# Patient Record
Sex: Male | Born: 1985 | Race: White | Hispanic: No | Marital: Single | Smoking: Current every day smoker
Health system: Southern US, Community
[De-identification: ages and names within clinical notes are randomized; demographics above are authoritative.]

## PROBLEM LIST (undated history)

## (undated) DIAGNOSIS — I509 Heart failure, unspecified: Secondary | ICD-10-CM

## (undated) DIAGNOSIS — I10 Essential (primary) hypertension: Secondary | ICD-10-CM

## (undated) DIAGNOSIS — R0683 Snoring: Secondary | ICD-10-CM

## (undated) DIAGNOSIS — K469 Unspecified abdominal hernia without obstruction or gangrene: Secondary | ICD-10-CM

## (undated) DIAGNOSIS — E669 Obesity, unspecified: Secondary | ICD-10-CM

## (undated) HISTORY — PX: UMBILICAL HERNIA REPAIR: SHX196

---

## 2018-08-16 ENCOUNTER — Emergency Department (HOSPITAL_COMMUNITY): Payer: Self-pay

## 2018-08-16 ENCOUNTER — Other Ambulatory Visit: Payer: Self-pay

## 2018-08-16 ENCOUNTER — Emergency Department (HOSPITAL_COMMUNITY)
Admission: EM | Admit: 2018-08-16 | Discharge: 2018-08-16 | Disposition: A | Discharge status: Home / Self Care | Payer: Self-pay | Attending: Emergency Medicine | Admitting: Emergency Medicine

## 2018-08-16 ENCOUNTER — Encounter (HOSPITAL_COMMUNITY): Payer: Self-pay | Admitting: *Deleted

## 2018-08-16 DIAGNOSIS — I10 Essential (primary) hypertension: Secondary | ICD-10-CM | POA: Insufficient documentation

## 2018-08-16 DIAGNOSIS — J069 Acute upper respiratory infection, unspecified: Secondary | ICD-10-CM | POA: Insufficient documentation

## 2018-08-16 DIAGNOSIS — H9221 Otorrhagia, right ear: Secondary | ICD-10-CM

## 2018-08-16 DIAGNOSIS — H9191 Unspecified hearing loss, right ear: Secondary | ICD-10-CM | POA: Insufficient documentation

## 2018-08-16 DIAGNOSIS — B9789 Other viral agents as the cause of diseases classified elsewhere: Secondary | ICD-10-CM

## 2018-08-16 HISTORY — DX: Essential (primary) hypertension: I10

## 2018-08-16 NOTE — ED Notes (Signed)
Pt is alert and oriented x 4 and is verbally responsive.  

## 2018-08-16 NOTE — ED Triage Notes (Signed)
Pt presents with active bleeding from right ear.  Pt c/o cough x 2 days with green/yellowish sputum.

## 2018-08-16 NOTE — Discharge Instructions (Signed)
You have cough and ear bleeding here. Stop using the cue tips at home for cleaning the ears. Call the ENT doctor listed and schedule the next available appointment. Stay home for the next week until your symptoms resolve. I am writing you a work note to that effect. Return to the ED with any chest pain, trouble breathing, or other new or worsening symptoms.

## 2018-08-16 NOTE — ED Provider Notes (Signed)
Emergency Department Provider Note   I have reviewed the triage vital signs and the nursing notes.   HISTORY  Chief Complaint ear bleeding (right) and Cough   HPI Donald Murray is a 33 y.o. male with PMH of HTN presents to the ED with right ear bleeding, fullness, cough.  Patient has had 2 days of cough and right ear fullness.  He notes sputum with green/yellow color.  No hemoptysis.  Patient denies any head injury.  He states he was playing at home when he felt some trickling out of his right ear which she then saw was blood.  Patient does use Q-tips frequently but not at the time of bleeding onset.  He has decreased hearing in the right ear.  No ringing in the ear.  No recent travel or sick contact.  No body aches. No fever.   Past Medical History:  Diagnosis Date  . Hypertension     There are no active problems to display for this patient.   Past Surgical History:  Procedure Laterality Date  . UMBILICAL HERNIA REPAIR     Allergies Patient has no allergy information on record.  No family history on file.  Social History Social History   Tobacco Use  . Smoking status: Not on file  Substance Use Topics  . 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 Drug use: Not Currently    Comment: Pt stated "It was opiates"    Review of Systems  Constitutional: No fever/chills Eyes: No visual changes. ENT: No sore throat. Positive right ear bleeding and fullness.  Cardiovascular: Denies chest pain. Respiratory: Denies shortness of breath. Positive cough.  Gastrointestinal: No abdominal pain. No nausea, no vomiting. No diarrhea. No constipation. Genitourinary: Negative for dysuria. Musculoskeletal: Negative for back pain. Skin: Negative for rash. Neurological: Negative for headaches, focal weakness or numbness.  10-point ROS otherwise negative.  ____________________________________________   PHYSICAL EXAM:  VITAL SIGNS: ED Triage Vitals   Enc Vitals Group     BP 08/16/18 2016 (!) 166/125     Pulse Rate 08/16/18 2016 (!) 128     Resp 08/16/18 2016 20     Temp 08/16/18 2016 98.8 F (37.1 C)     Temp Source 08/16/18 2016 Oral     SpO2 08/16/18 2016 98 %     Weight 08/16/18 2017 270 lb (122.5 kg)     Height 08/16/18 2017 5\' 11"  (1.803 m)     Pain Score 08/16/18 2017 6   Constitutional: Alert and oriented. Well appearing and in no acute distress. Eyes: Conjunctivae are normal. Head: Atraumatic. Ears:  Healthy appearing ear canals. BRB coming from the right ear. TM visualized without perforation. Question abrasion of the canal. No discharge.  Nose: No congestion/rhinnorhea. Mouth/Throat: Mucous membranes are moist.  Oropharynx with mild erythema. No exudate.  Neck: No stridor.   Cardiovascular: Normal rate, regular rhythm. Good peripheral circulation. Grossly normal heart sounds.   Respiratory: Normal respiratory effort.  No retractions. Lungs CTAB. Gastrointestinal: No distention.  Musculoskeletal: No lower extremity tenderness nor edema. No gross deformities of extremities. Neurologic:  Normal speech and language. No gross focal neurologic deficits are appreciated.  Skin:  Skin is warm, dry and intact. No rash noted.  ____________________________________________  RADIOLOGY  Dg Chest Portable 1 View  Result Date: 08/16/2018 CLINICAL DATA:  Productive cough. EXAM: PORTABLE CHEST 1 VIEW COMPARISON:  None. FINDINGS: The heart size and mediastinal contours are within normal limits. Both  lungs are clear. The visualized skeletal structures are unremarkable. IMPRESSION: No active disease. Electronically Signed   By: Lupita Raider, M.D.   On: 08/16/2018 20:55    ____________________________________________   PROCEDURES  Procedure(s) performed:   Procedures  None  ____________________________________________   INITIAL IMPRESSION / ASSESSMENT AND PLAN / ED COURSE  Pertinent labs & imaging results that were  available during my care of the patient were reviewed by me and considered in my medical decision making (see chart for details).  Patient with active bleeding from the right ear which began spontaneously.  He has had cough for the past 2 days without fever.  No travel or known COVID exposure. Plan for CXR.  The right ear exam is somewhat limited due to oozing blood but bleeding not appear active at this time.  I am able to visualize the TM and do not appreciate a perforation.  Patient will require ENT follow-up as an outpatient.  Decreased hearing on the right. Advised that the patient discontinue cue-tip usage at home.   CXR without infiltrate. Plan for ENT follow up and supportive care at home. Advised self quarantine for 1 week with URI symptoms. Patient verbalizes understanding of this recommendation. Discussed ED return precautions.   ____________________________________________  FINAL CLINICAL IMPRESSION(S) / ED DIAGNOSES  Final diagnoses:  Viral URI with cough  Bleeding from right ear  Decreased hearing of right ear    Note:  This document was prepared using Dragon voice recognition software and may include unintentional dictation errors.  Alona Bene, MD Emergency Medicine    Long, Arlyss Repress, MD 08/16/18 2258

## 2019-03-03 ENCOUNTER — Emergency Department (HOSPITAL_COMMUNITY): Payer: Self-pay

## 2019-03-03 ENCOUNTER — Encounter (HOSPITAL_COMMUNITY): Payer: Self-pay | Admitting: Emergency Medicine

## 2019-03-03 ENCOUNTER — Other Ambulatory Visit: Payer: Self-pay

## 2019-03-03 ENCOUNTER — Inpatient Hospital Stay (HOSPITAL_COMMUNITY)
Admission: EM | Admit: 2019-03-03 | Discharge: 2019-03-06 | DRG: 501 | Discharge status: Against Medical Advice | Payer: Self-pay | Attending: Internal Medicine | Admitting: Internal Medicine

## 2019-03-03 DIAGNOSIS — L03116 Cellulitis of left lower limb: Secondary | ICD-10-CM | POA: Diagnosis present

## 2019-03-03 DIAGNOSIS — M65142 Other infective (teno)synovitis, left hand: Principal | ICD-10-CM | POA: Diagnosis present

## 2019-03-03 DIAGNOSIS — I10 Essential (primary) hypertension: Secondary | ICD-10-CM | POA: Diagnosis present

## 2019-03-03 DIAGNOSIS — L03113 Cellulitis of right upper limb: Secondary | ICD-10-CM

## 2019-03-03 DIAGNOSIS — Z590 Homelessness: Secondary | ICD-10-CM

## 2019-03-03 DIAGNOSIS — Z5329 Procedure and treatment not carried out because of patient's decision for other reasons: Secondary | ICD-10-CM | POA: Diagnosis present

## 2019-03-03 DIAGNOSIS — Z560 Unemployment, unspecified: Secondary | ICD-10-CM

## 2019-03-03 DIAGNOSIS — L02413 Cutaneous abscess of right upper limb: Secondary | ICD-10-CM | POA: Diagnosis present

## 2019-03-03 DIAGNOSIS — L988 Other specified disorders of the skin and subcutaneous tissue: Secondary | ICD-10-CM | POA: Diagnosis present

## 2019-03-03 DIAGNOSIS — L03114 Cellulitis of left upper limb: Secondary | ICD-10-CM

## 2019-03-03 DIAGNOSIS — Z20828 Contact with and (suspected) exposure to other viral communicable diseases: Secondary | ICD-10-CM | POA: Diagnosis present

## 2019-03-03 DIAGNOSIS — E876 Hypokalemia: Secondary | ICD-10-CM

## 2019-03-03 DIAGNOSIS — L02512 Cutaneous abscess of left hand: Secondary | ICD-10-CM | POA: Diagnosis present

## 2019-03-03 DIAGNOSIS — F1110 Opioid abuse, uncomplicated: Secondary | ICD-10-CM | POA: Diagnosis present

## 2019-03-03 DIAGNOSIS — F1990 Other psychoactive substance use, unspecified, uncomplicated: Secondary | ICD-10-CM

## 2019-03-03 DIAGNOSIS — F17200 Nicotine dependence, unspecified, uncomplicated: Secondary | ICD-10-CM | POA: Diagnosis present

## 2019-03-03 DIAGNOSIS — L0390 Cellulitis, unspecified: Secondary | ICD-10-CM | POA: Diagnosis present

## 2019-03-03 LAB — URINALYSIS, ROUTINE W REFLEX MICROSCOPIC
Glucose, UA: NEGATIVE mg/dL
Hgb urine dipstick: NEGATIVE
Ketones, ur: 5 mg/dL — AB
Leukocytes,Ua: NEGATIVE
Nitrite: NEGATIVE
Protein, ur: 100 mg/dL — AB
Specific Gravity, Urine: 1.029 (ref 1.005–1.030)
pH: 5 (ref 5.0–8.0)

## 2019-03-03 LAB — LACTIC ACID, PLASMA: Lactic Acid, Venous: 1 mmol/L (ref 0.5–1.9)

## 2019-03-03 MED ORDER — ACETAMINOPHEN 325 MG PO TABS
650.0000 mg | ORAL_TABLET | Freq: Once | ORAL | Status: AC
  Administered 2019-03-03: 650 mg via ORAL
  Filled 2019-03-03: qty 2

## 2019-03-03 MED ORDER — IOHEXOL 300 MG/ML  SOLN
100.0000 mL | Freq: Once | INTRAMUSCULAR | Status: AC | PRN
  Administered 2019-03-03: 23:00:00 100 mL via INTRAVENOUS

## 2019-03-03 MED ORDER — SODIUM CHLORIDE (PF) 0.9 % IJ SOLN
INTRAMUSCULAR | Status: AC
  Filled 2019-03-03: qty 50

## 2019-03-03 MED ORDER — SODIUM CHLORIDE 0.9 % IV BOLUS
1000.0000 mL | Freq: Once | INTRAVENOUS | Status: AC
  Administered 2019-03-03: 1000 mL via INTRAVENOUS

## 2019-03-03 MED ORDER — VANCOMYCIN HCL 10 G IV SOLR
2000.0000 mg | Freq: Once | INTRAVENOUS | Status: AC
  Administered 2019-03-04: 2000 mg via INTRAVENOUS
  Filled 2019-03-03: qty 2000

## 2019-03-03 MED ORDER — POTASSIUM CHLORIDE CRYS ER 20 MEQ PO TBCR
40.0000 meq | EXTENDED_RELEASE_TABLET | Freq: Once | ORAL | Status: AC
  Administered 2019-03-04: 40 meq via ORAL
  Filled 2019-03-03: qty 2

## 2019-03-03 MED ORDER — HYDROMORPHONE HCL 1 MG/ML IJ SOLN
1.0000 mg | Freq: Once | INTRAMUSCULAR | Status: AC
  Administered 2019-03-03: 1 mg via INTRAVENOUS
  Filled 2019-03-03: qty 1

## 2019-03-03 NOTE — Progress Notes (Signed)
A consult was received from an ED physician for vancomycin per pharmacy dosing.  The patient's profile has been reviewed for ht/wt/allergies/indication/available labs.   A one time order has been placed for vancomycin 2gm iv x1.  Further antibiotics/pharmacy consults should be ordered by admitting physician if indicated.                       Thank you, Nani Skillern Crowford 03/03/2019  11:36 PM

## 2019-03-03 NOTE — ED Provider Notes (Signed)
[facility]-EMERGENCY DEPT Provider Note   CSN: [id number] Arrival date & time: 03/03/19  1921     History   Chief Complaint Chief Complaint  Patient presents with   Arm Swelling    both   Arm Pain    both    HPI Donald Murray is a 33 y.o. male who presents with bilateral arm pain and swelling.  Past medical history significant for IV drug abuse.  Patient states he was previously at [facility] house several months ago but was kicked out because he relapsed on heroin.  He has been using regularly in both arms and uses the same needle.  Over the past several days he has had increased swelling over his left hand and right elbow.  He reports severe left hand pain and has have difficulty making a fist.  He has multiple wounds over the areas where he is injected as well. He has had abscesses in the past and has had to have them drained.  He denies any drainage from the arm but it feels hot.  He is unsure if he has had a fever or not.  He denies neck pain, back pain, chest pain, shortness of breath, cough, abdominal pain, nausea, vomiting, diarrhea.  He is living in a hotel currently.     HPI  Past Medical History:  Diagnosis Date   Hypertension     There are no active problems to display for this patient.   Past Surgical History:  Procedure Laterality Date   UMBILICAL HERNIA REPAIR          Home Medications    Prior to Admission medications   Not on File    Family History History reviewed. No pertinent family history.  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   Review of Systems Review of Systems  Constitutional: Negative for chills and fever.    Respiratory: Negative for cough and shortness of breath.   Cardiovascular: Negative for chest pain.  Gastrointestinal: Negative for abdominal pain, nausea and vomiting.  Musculoskeletal: Positive for arthralgias, joint swelling and myalgias.  Skin: Positive for wound.  Neurological: Negative for headaches.  All other systems reviewed and are negative.    Physical Exam Updated Vital Signs BP (!) 172/108 (BP Location: Right Arm) Comment: pt states hx of HTN   Pulse 98    Temp 99.4 F (37.4 C) (Oral)    Resp 16    Ht 5\' 11"  (1.803 m)    Wt 108.9 kg    SpO2 95%    BMI 33.47 kg/m   Physical Exam Vitals signs and nursing note reviewed.  Constitutional:      General: He is in acute distress.     Appearance: Normal appearance. He is well-developed. He is not ill-appearing.     Comments: Disheveled.  Acute distress due to pain  HENT:     Head: Normocephalic and atraumatic.  Eyes:     General: No scleral icterus.       Right eye: No discharge.        Left eye: No discharge.     Conjunctiva/sclera: Conjunctivae normal.  Pupils: Pupils are equal, round, and reactive to light.  Neck:     Musculoskeletal: Normal range of motion.  Cardiovascular:     Rate and Rhythm: Tachycardia present.  Pulmonary:     Effort: Pulmonary effort is normal. No respiratory distress.     Breath sounds: Normal breath sounds.  Abdominal:     General: There is no distension.     Palpations: Abdomen is soft.     Tenderness: There is no abdominal tenderness. There is no guarding.  Musculoskeletal:     Comments: Left arm: Multiple scab wounds over the arm. There is diffuse hand swelling with mild erythema and a scab over the dorsal hand with significant tenderness. No redness of the fingers to suggest tenosynovitis. 2+ radial pulse  Right arm: Diffuse redness and warmth over the right hand without significant tenderness. There is a red, swollen, indurated appearing area over the antecubital fossa. No  fluctuance or drainage noted. 2+ radial pulse  Skin:    General: Skin is warm and dry.  Neurological:     Mental Status: He is alert and oriented to person, place, and time.  Psychiatric:        Behavior: Behavior normal.      ED Treatments / Results  Labs (all labs ordered are listed, but only abnormal results are displayed) Labs Reviewed  COMPREHENSIVE METABOLIC PANEL - Abnormal; Notable for the following components:      Result Value   Potassium 3.3 (*)    Calcium 8.7 (*)    AST 14 (*)    All other components within normal limits  CBC WITH DIFFERENTIAL/PLATELET - Abnormal; Notable for the following components:   WBC 19.8 (*)    Neutro Abs 11.9 (*)    Lymphs Abs 6.0 (*)    Monocytes Absolute 1.3 (*)    Abs Immature Granulocytes 0.12 (*)    All other components within normal limits  URINALYSIS, ROUTINE W REFLEX MICROSCOPIC - Abnormal; Notable for the following components:   Color, Urine BROWN (*)    APPearance TURBID (*)    Bilirubin Urine SMALL (*)    Ketones, ur 5 (*)    Protein, ur 100 (*)    Bacteria, UA FEW (*)    All other components within normal limits  CULTURE, BLOOD (ROUTINE X 2)  CULTURE, BLOOD (ROUTINE X 2)  SARS CORONAVIRUS 2 (TAT 6-24 HRS)  LACTIC ACID, PLASMA  LACTIC ACID, PLASMA  RAPID URINE DRUG SCREEN, HOSP PERFORMED    EKG None  Radiology Ct Hand Left W Contrast  Result Date: 03/03/2019 CLINICAL DATA:  Bilateral hand and forearm pain/swelling. IV drug abuse. EXAM: CT OF THE UPPER LEFT EXTREMITY WITH CONTRAST TECHNIQUE: Multidetector CT imaging of the upper left extremity was performed according to the standard protocol following intravenous contrast administration. CONTRAST:  OMNIPAQUE IOHEXOL 300 MG/ML  SOLN COMPARISON:  None. FINDINGS: Bones/Joint/Cartilage No osseous destruction or periosteal reaction. No fracture or dislocation. Normal alignment. No joint effusion. Ligaments Ligaments are suboptimally evaluated by CT. Muscles and Tendons  Grossly intact. Soft tissue Severe dorsal hand and wrist soft tissue swelling. No fluid collection or hematoma. Single focus of subcutaneous gas dorsally between the third and fourth metacarpal heads. No soft tissue mass. IMPRESSION: 1. Severe dorsal hand and wrist soft tissue swelling, nonspecific, but favoring cellulitis given clinical history. No discrete abscess. 2.  No acute osseous abnormality. Electronically Signed   By: Obie Dredge M.D.   On: 03/03/2019 23:47   Dg Chest [facility]  1 View  Result Date: 03/03/2019 CLINICAL DATA:  Fever and sepsis. EXAM: PORTABLE CHEST 1 VIEW COMPARISON:  August 16, 2018 FINDINGS: Cardiomediastinal silhouette is normal. Mediastinal contours appear intact. There is no evidence of focal airspace consolidation, pleural effusion or pneumothorax. Osseous structures are without acute abnormality. Soft tissues are grossly normal. IMPRESSION: No active disease. Electronically Signed   By: Fidela Salisbury M.D.   On: 03/03/2019 21:35    Procedures Procedures (including critical care time)  Medications Ordered in ED Medications  sodium chloride (PF) 0.9 % injection (has no administration in time range)  vancomycin (VANCOCIN) 2,000 mg in sodium chloride 0.9 % 500 mL IVPB (has no administration in time range)  potassium chloride SA (KLOR-CON) CR tablet 40 mEq (has no administration in time range)  sodium chloride 0.9 % bolus 1,000 mL (1,000 mLs Intravenous New Bag/Given 03/03/19 2107)  HYDROmorphone (DILAUDID) injection 1 mg (1 mg Intravenous Given 03/03/19 2135)  acetaminophen (TYLENOL) tablet 650 mg (650 mg Oral Given 03/03/19 2135)  iohexol (OMNIPAQUE) 300 MG/ML solution 100 mL (100 mLs Intravenous Contrast Given 03/03/19 2315)     Initial Impression / Assessment and Plan / ED Course  I have reviewed the triage vital signs and the nursing notes.  Pertinent labs & imaging results that were available during my care of the patient were reviewed by me and considered in  my medical decision making (see chart for details).  33 year old male presents with severe left hand pain with bilateral arm swelling over the past several days.  He is hypertensive and running a low-grade fever. Initial vitals meet SIRS criteria.  Will obtain labs, UA, imaging.  Will perform informal soft tissue ultrasound of the right elbow.  CBC is remarkable for significant leukocytosis of 19.8.  CMP is remarkable for mild hypokalemia, hypocalcemia.  His lactate is normal.  Blood cultures were obtained.  UA does not show any evidence of infection but it is turbid and brown.  Chest x-ray is negative.  Informal bedside ultrasound does not show a drainable abscess at this time.  CT of his left hand does not show discrete abscess is consistent with cellulitis.  Due to extensive wounds and meeting SIRS criteria will start antibiotics and admit for further management.  Discussed with Dr. Veverly Fells who will admit.  Of note nursing states that as he was going to CT a syringe fell on the floor from under his blankets. They confiscated the syringe and will continue to monitor patient.  Final Clinical Impressions(s) / ED Diagnoses   Final diagnoses:  Cellulitis of left upper extremity  Cellulitis of right upper extremity  IVDU (intravenous drug user)  Hypokalemia    ED Discharge Orders    None       Recardo Evangelist, PA-C 03/04/19 0015    Carmin Muskrat, MD 03/04/19 0025

## 2019-03-03 NOTE — ED Triage Notes (Signed)
Patient is complaining of swelling and pain in both arms up to the elbows. 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 Patient has

## 2019-03-03 NOTE — ED Notes (Signed)
Empty syringe fell on the floor when patient was transferred in CT. PA made aware.

## 2019-03-04 ENCOUNTER — Encounter (HOSPITAL_COMMUNITY)
Admission: EM | Discharge status: Against Medical Advice | Payer: Self-pay | Source: Home / Self Care | Attending: Internal Medicine

## 2019-03-04 ENCOUNTER — Inpatient Hospital Stay (HOSPITAL_COMMUNITY): Payer: Self-pay | Admitting: Certified Registered"

## 2019-03-04 ENCOUNTER — Other Ambulatory Visit: Payer: Self-pay

## 2019-03-04 DIAGNOSIS — L0390 Cellulitis, unspecified: Secondary | ICD-10-CM | POA: Diagnosis present

## 2019-03-04 DIAGNOSIS — L03119 Cellulitis of unspecified part of limb: Secondary | ICD-10-CM

## 2019-03-04 DIAGNOSIS — L03114 Cellulitis of left upper limb: Secondary | ICD-10-CM

## 2019-03-04 DIAGNOSIS — E876 Hypokalemia: Secondary | ICD-10-CM

## 2019-03-04 DIAGNOSIS — L03113 Cellulitis of right upper limb: Secondary | ICD-10-CM

## 2019-03-04 DIAGNOSIS — F1990 Other psychoactive substance use, unspecified, uncomplicated: Secondary | ICD-10-CM | POA: Diagnosis present

## 2019-03-04 HISTORY — PX: I & D EXTREMITY: SHX5045

## 2019-03-04 HISTORY — PX: IRRIGATION AND DEBRIDEMENT ABSCESS: SHX5252

## 2019-03-04 LAB — COMPREHENSIVE METABOLIC PANEL
ALT: 13 U/L (ref 0–44)
ALT: 21 U/L (ref 0–44)
AST: 14 U/L — ABNORMAL LOW (ref 15–41)
AST: 15 U/L (ref 15–41)
Albumin: 3.4 g/dL — ABNORMAL LOW (ref 3.5–5.0)
Albumin: 4.1 g/dL (ref 3.5–5.0)
Alkaline Phosphatase: 115 U/L (ref 38–126)
Alkaline Phosphatase: 64 U/L (ref 38–126)
Anion gap: 13 (ref 5–15)
Anion gap: 8 (ref 5–15)
BUN: 11 mg/dL (ref 6–20)
BUN: 11 mg/dL (ref 6–20)
CO2: 21 mmol/L — ABNORMAL LOW (ref 22–32)
CO2: 25 mmol/L (ref 22–32)
Calcium: 8.7 mg/dL — ABNORMAL LOW (ref 8.9–10.3)
Calcium: 8.7 mg/dL — ABNORMAL LOW (ref 8.9–10.3)
Chloride: 100 mmol/L (ref 98–111)
Chloride: 105 mmol/L (ref 98–111)
Creatinine, Ser: 0.65 mg/dL (ref 0.61–1.24)
Creatinine, Ser: 0.79 mg/dL (ref 0.61–1.24)
GFR calc Af Amer: >60 mL/min (ref >60)
GFR calc Af Amer: >60 mL/min (ref >60)
GFR calc non Af Amer: >60 mL/min (ref >60)
GFR calc non Af Amer: >60 mL/min (ref >60)
Glucose, Bld: 84 mg/dL (ref 70–99)
Glucose, Bld: 91 mg/dL (ref 70–99)
Potassium: 3.3 mmol/L — ABNORMAL LOW (ref 3.5–5.1)
Potassium: 3.3 mmol/L — ABNORMAL LOW (ref 3.5–5.1)
Sodium: 134 mmol/L — ABNORMAL LOW (ref 135–145)
Sodium: 138 mmol/L (ref 135–145)
Total Bilirubin: 0.5 mg/dL (ref 0.3–1.2)
Total Bilirubin: 1.2 mg/dL (ref 0.3–1.2)
Total Protein: 7 g/dL (ref 6.5–8.1)
Total Protein: 7.3 g/dL (ref 6.5–8.1)

## 2019-03-04 LAB — CBC WITH DIFFERENTIAL/PLATELET
Abs Immature Granulocytes: 0.12 10*3/uL — ABNORMAL HIGH (ref 0.00–0.07)
Abs Immature Granulocytes: 0.18 10*3/uL — ABNORMAL HIGH (ref 0.00–0.07)
Basophils Absolute: 0.1 10*3/uL (ref 0.0–0.1)
Basophils Absolute: 0.1 10*3/uL (ref 0.0–0.1)
Basophils Relative: 0 %
Basophils Relative: 0 %
Eosinophils Absolute: 0 10*3/uL (ref 0.0–0.5)
Eosinophils Absolute: 0.4 10*3/uL (ref 0.0–0.5)
Eosinophils Relative: 0 %
Eosinophils Relative: 2 %
HCT: 39.7 % (ref 39.0–52.0)
HCT: 43.5 % (ref 39.0–52.0)
Hemoglobin: 12.5 g/dL — ABNORMAL LOW (ref 13.0–17.0)
Hemoglobin: 14.3 g/dL (ref 13.0–17.0)
Immature Granulocytes: 1 %
Immature Granulocytes: 1 %
Lymphocytes Relative: 30 %
Lymphocytes Relative: 7 %
Lymphs Abs: 1.3 10*3/uL (ref 0.7–4.0)
Lymphs Abs: 6 10*3/uL — ABNORMAL HIGH (ref 0.7–4.0)
MCH: 25.9 pg — ABNORMAL LOW (ref 26.0–34.0)
MCH: 30.2 pg (ref 26.0–34.0)
MCHC: 31.5 g/dL (ref 30.0–36.0)
MCHC: 32.9 g/dL (ref 30.0–36.0)
MCV: 82.2 fL (ref 80.0–100.0)
MCV: 91.8 fL (ref 80.0–100.0)
Monocytes Absolute: 1 10*3/uL (ref 0.1–1.0)
Monocytes Absolute: 1.3 10*3/uL — ABNORMAL HIGH (ref 0.1–1.0)
Monocytes Relative: 6 %
Monocytes Relative: 7 %
Neutro Abs: 11.9 10*3/uL — ABNORMAL HIGH (ref 1.7–7.7)
Neutro Abs: 14.8 10*3/uL — ABNORMAL HIGH (ref 1.7–7.7)
Neutrophils Relative %: 60 %
Neutrophils Relative %: 86 %
Platelets: 281 10*3/uL (ref 150–400)
Platelets: 359 10*3/uL (ref 150–400)
RBC: 4.74 MIL/uL (ref 4.22–5.81)
RBC: 4.83 MIL/uL (ref 4.22–5.81)
RDW: 13.3 % (ref 11.5–15.5)
RDW: 15.7 % — ABNORMAL HIGH (ref 11.5–15.5)
WBC: 17.3 10*3/uL — ABNORMAL HIGH (ref 4.0–10.5)
WBC: 19.8 10*3/uL — ABNORMAL HIGH (ref 4.0–10.5)
nRBC: 0 % (ref 0.0–0.2)
nRBC: 0 % (ref 0.0–0.2)

## 2019-03-04 LAB — CBC
HCT: 37.9 % — ABNORMAL LOW (ref 39.0–52.0)
Hemoglobin: 12.3 g/dL — ABNORMAL LOW (ref 13.0–17.0)
MCH: 25.6 pg — ABNORMAL LOW (ref 26.0–34.0)
MCHC: 32.5 g/dL (ref 30.0–36.0)
MCV: 78.8 fL — ABNORMAL LOW (ref 80.0–100.0)
Platelets: 356 10*3/uL (ref 150–400)
RBC: 4.81 MIL/uL (ref 4.22–5.81)
RDW: 15.5 % (ref 11.5–15.5)
WBC: 16.5 10*3/uL — ABNORMAL HIGH (ref 4.0–10.5)
nRBC: 0 % (ref 0.0–0.2)

## 2019-03-04 LAB — RAPID URINE DRUG SCREEN, HOSP PERFORMED
Amphetamines: POSITIVE — AB
Barbiturates: NOT DETECTED
Benzodiazepines: NOT DETECTED
Cocaine: NOT DETECTED
Opiates: POSITIVE — AB
Tetrahydrocannabinol: NOT DETECTED

## 2019-03-04 LAB — LACTIC ACID, PLASMA: Lactic Acid, Venous: 1.2 mmol/L (ref 0.5–1.9)

## 2019-03-04 LAB — HIV ANTIBODY (ROUTINE TESTING W REFLEX): HIV Screen 4th Generation wRfx: NONREACTIVE

## 2019-03-04 LAB — SURGICAL PCR SCREEN
MRSA, PCR: NEGATIVE
Staphylococcus aureus: POSITIVE — AB

## 2019-03-04 LAB — SARS CORONAVIRUS 2 BY RT PCR (HOSPITAL ORDER, PERFORMED IN ~~LOC~~ HOSPITAL LAB): SARS Coronavirus 2: NEGATIVE

## 2019-03-04 SURGERY — IRRIGATION AND DEBRIDEMENT EXTREMITY
Anesthesia: General | Site: Hand | Laterality: Right

## 2019-03-04 MED ORDER — SODIUM CHLORIDE 0.9 % IV SOLN
250.0000 mL | INTRAVENOUS | Status: DC | PRN
  Administered 2019-03-04: 250 mL via INTRAVENOUS

## 2019-03-04 MED ORDER — MIDAZOLAM HCL 5 MG/5ML IJ SOLN
INTRAMUSCULAR | Status: DC | PRN
  Administered 2019-03-04: 2 mg via INTRAVENOUS

## 2019-03-04 MED ORDER — VANCOMYCIN HCL 10 G IV SOLR
1750.0000 mg | Freq: Two times a day (BID) | INTRAVENOUS | Status: DC
  Administered 2019-03-04 – 2019-03-05 (×4): 1750 mg via INTRAVENOUS
  Filled 2019-03-04 (×7): qty 1750

## 2019-03-04 MED ORDER — HYDROMORPHONE HCL 1 MG/ML IJ SOLN
INTRAMUSCULAR | Status: DC | PRN
  Administered 2019-03-04: 1 mg via INTRAVENOUS
  Administered 2019-03-04 (×2): 0.5 mg via INTRAVENOUS

## 2019-03-04 MED ORDER — PROPOFOL 1000 MG/100ML IV EMUL
INTRAVENOUS | Status: AC
  Filled 2019-03-04: qty 100

## 2019-03-04 MED ORDER — HYDROMORPHONE HCL 1 MG/ML IJ SOLN
INTRAMUSCULAR | Status: AC
  Filled 2019-03-04: qty 0.5

## 2019-03-04 MED ORDER — ADULT MULTIVITAMIN W/MINERALS CH
1.0000 | ORAL_TABLET | Freq: Every day | ORAL | Status: DC
  Administered 2019-03-04 – 2019-03-06 (×3): 1 via ORAL
  Filled 2019-03-04 (×3): qty 1

## 2019-03-04 MED ORDER — ACETAMINOPHEN 325 MG PO TABS
650.0000 mg | ORAL_TABLET | Freq: Three times a day (TID) | ORAL | Status: DC
  Administered 2019-03-04 – 2019-03-06 (×7): 650 mg via ORAL
  Filled 2019-03-04 (×7): qty 2

## 2019-03-04 MED ORDER — LACTATED RINGERS IV SOLN
INTRAVENOUS | Status: DC
  Administered 2019-03-04: 17:00:00 via INTRAVENOUS

## 2019-03-04 MED ORDER — DEXMEDETOMIDINE HCL IN NACL 200 MCG/50ML IV SOLN
INTRAVENOUS | Status: DC | PRN
  Administered 2019-03-04: 8 ug via INTRAVENOUS
  Administered 2019-03-04: 4 ug via INTRAVENOUS

## 2019-03-04 MED ORDER — PROPOFOL 10 MG/ML IV BOLUS
INTRAVENOUS | Status: AC
  Filled 2019-03-04: qty 20

## 2019-03-04 MED ORDER — CLONIDINE HCL 0.1 MG PO TABS: 0.1000 mg | ORAL_TABLET | ORAL | Status: DC

## 2019-03-04 MED ORDER — ONDANSETRON 4 MG PO TBDP
4.0000 mg | ORAL_TABLET | Freq: Four times a day (QID) | ORAL | Status: DC | PRN
  Administered 2019-03-05: 20:00:00 4 mg via ORAL
  Filled 2019-03-04: qty 1

## 2019-03-04 MED ORDER — HYDROXYZINE HCL 25 MG PO TABS
25.0000 mg | ORAL_TABLET | Freq: Four times a day (QID) | ORAL | Status: DC | PRN
  Administered 2019-03-04 – 2019-03-05 (×3): 25 mg via ORAL
  Filled 2019-03-04 (×3): qty 1

## 2019-03-04 MED ORDER — VITAMIN B-1 100 MG PO TABS
100.0000 mg | ORAL_TABLET | Freq: Every day | ORAL | Status: DC
  Administered 2019-03-04 – 2019-03-06 (×3): 100 mg via ORAL
  Filled 2019-03-04 (×3): qty 1

## 2019-03-04 MED ORDER — NICOTINE 21 MG/24HR TD PT24
21.0000 mg | MEDICATED_PATCH | Freq: Every day | TRANSDERMAL | Status: DC
  Administered 2019-03-04 – 2019-03-05 (×2): 21 mg via TRANSDERMAL
  Filled 2019-03-04 (×3): qty 1

## 2019-03-04 MED ORDER — ACETAMINOPHEN 650 MG RE SUPP: 650.0000 mg | Freq: Four times a day (QID) | RECTAL | Status: DC | PRN

## 2019-03-04 MED ORDER — SODIUM CHLORIDE 0.9 % IV SOLN
INTRAVENOUS | Status: DC
  Administered 2019-03-04: 12:00:00 via INTRAVENOUS

## 2019-03-04 MED ORDER — CLONIDINE HCL 0.1 MG PO TABS
0.1000 mg | ORAL_TABLET | Freq: Four times a day (QID) | ORAL | Status: DC
  Administered 2019-03-04 – 2019-03-05 (×7): 0.1 mg via ORAL
  Filled 2019-03-04 (×8): qty 1

## 2019-03-04 MED ORDER — ACETAMINOPHEN 325 MG PO TABS: 650.0000 mg | ORAL_TABLET | Freq: Four times a day (QID) | ORAL | Status: DC | PRN

## 2019-03-04 MED ORDER — 0.9 % SODIUM CHLORIDE (POUR BTL) OPTIME
TOPICAL | Status: DC | PRN
  Administered 2019-03-04: 1000 mL

## 2019-03-04 MED ORDER — METHOCARBAMOL 500 MG PO TABS
500.0000 mg | ORAL_TABLET | Freq: Three times a day (TID) | ORAL | Status: DC | PRN
  Administered 2019-03-04 – 2019-03-05 (×3): 500 mg via ORAL
  Filled 2019-03-04 (×3): qty 1

## 2019-03-04 MED ORDER — DICYCLOMINE HCL 20 MG PO TABS
20.0000 mg | ORAL_TABLET | Freq: Four times a day (QID) | ORAL | Status: DC | PRN
  Filled 2019-03-04: qty 1

## 2019-03-04 MED ORDER — HYDROMORPHONE HCL 1 MG/ML IJ SOLN
INTRAMUSCULAR | Status: AC
  Filled 2019-03-04: qty 1

## 2019-03-04 MED ORDER — SODIUM CHLORIDE 0.9% FLUSH: 3.0000 mL | INTRAVENOUS | Status: DC | PRN

## 2019-03-04 MED ORDER — PROPOFOL 10 MG/ML IV BOLUS
INTRAVENOUS | Status: DC | PRN
  Administered 2019-03-04: 200 mg via INTRAVENOUS

## 2019-03-04 MED ORDER — FENTANYL CITRATE (PF) 100 MCG/2ML IJ SOLN
INTRAMUSCULAR | Status: DC | PRN
  Administered 2019-03-04: 100 ug via INTRAVENOUS
  Administered 2019-03-04 (×2): 50 ug via INTRAVENOUS
  Administered 2019-03-04: 100 ug via INTRAVENOUS
  Administered 2019-03-04 (×2): 50 ug via INTRAVENOUS

## 2019-03-04 MED ORDER — FENTANYL CITRATE (PF) 250 MCG/5ML IJ SOLN
INTRAMUSCULAR | Status: AC
  Filled 2019-03-04: qty 5

## 2019-03-04 MED ORDER — HYDROMORPHONE HCL 1 MG/ML IJ SOLN
0.2500 mg | INTRAMUSCULAR | Status: DC | PRN
  Administered 2019-03-04 (×2): 0.5 mg via INTRAVENOUS

## 2019-03-04 MED ORDER — DEXAMETHASONE SODIUM PHOSPHATE 10 MG/ML IJ SOLN
INTRAMUSCULAR | Status: DC | PRN
  Administered 2019-03-04: 10 mg via INTRAVENOUS

## 2019-03-04 MED ORDER — PROMETHAZINE HCL 25 MG/ML IJ SOLN: 6.2500 mg | INTRAMUSCULAR | Status: DC | PRN

## 2019-03-04 MED ORDER — ENOXAPARIN SODIUM 40 MG/0.4ML ~~LOC~~ SOLN
40.0000 mg | SUBCUTANEOUS | Status: DC
  Administered 2019-03-04 – 2019-03-05 (×2): 40 mg via SUBCUTANEOUS
  Filled 2019-03-04 (×3): qty 0.4

## 2019-03-04 MED ORDER — CLONIDINE HCL 0.1 MG PO TABS: 0.1000 mg | ORAL_TABLET | Freq: Every day | ORAL | Status: DC

## 2019-03-04 MED ORDER — SODIUM CHLORIDE 0.9% FLUSH
3.0000 mL | Freq: Two times a day (BID) | INTRAVENOUS | Status: DC
  Administered 2019-03-04 – 2019-03-05 (×2): 3 mL via INTRAVENOUS

## 2019-03-04 MED ORDER — KETOROLAC TROMETHAMINE 30 MG/ML IJ SOLN
30.0000 mg | Freq: Four times a day (QID) | INTRAMUSCULAR | Status: DC | PRN
  Administered 2019-03-04 – 2019-03-06 (×6): 30 mg via INTRAVENOUS
  Filled 2019-03-04 (×6): qty 1

## 2019-03-04 MED ORDER — LIDOCAINE 2% (20 MG/ML) 5 ML SYRINGE
INTRAMUSCULAR | Status: DC | PRN
  Administered 2019-03-04: 100 mg via INTRAVENOUS

## 2019-03-04 MED ORDER — FOLIC ACID 1 MG PO TABS
1.0000 mg | ORAL_TABLET | Freq: Every day | ORAL | Status: DC
  Administered 2019-03-04 – 2019-03-06 (×3): 1 mg via ORAL
  Filled 2019-03-04 (×3): qty 1

## 2019-03-04 MED ORDER — ROCURONIUM BROMIDE 50 MG/5ML IV SOSY
PREFILLED_SYRINGE | INTRAVENOUS | Status: DC | PRN
  Administered 2019-03-04: 20 mg via INTRAVENOUS

## 2019-03-04 MED ORDER — MIDAZOLAM HCL 2 MG/2ML IJ SOLN
INTRAMUSCULAR | Status: AC
  Filled 2019-03-04: qty 2

## 2019-03-04 MED ORDER — TRAZODONE HCL 50 MG PO TABS
50.0000 mg | ORAL_TABLET | Freq: Every day | ORAL | Status: DC
  Administered 2019-03-04 – 2019-03-05 (×3): 50 mg via ORAL
  Filled 2019-03-04 (×3): qty 1

## 2019-03-04 MED ORDER — SUCCINYLCHOLINE CHLORIDE 20 MG/ML IJ SOLN
INTRAMUSCULAR | Status: DC | PRN
  Administered 2019-03-04: 120 mg via INTRAVENOUS

## 2019-03-04 MED ORDER — DEXMEDETOMIDINE HCL IN NACL 200 MCG/50ML IV SOLN
INTRAVENOUS | Status: AC
  Filled 2019-03-04: qty 50

## 2019-03-04 MED ORDER — FENTANYL CITRATE (PF) 100 MCG/2ML IJ SOLN: 25.0000 ug | INTRAMUSCULAR | Status: DC | PRN

## 2019-03-04 MED ORDER — LOPERAMIDE HCL 2 MG PO CAPS: 2.0000 mg | ORAL_CAPSULE | ORAL | Status: DC | PRN

## 2019-03-04 MED ORDER — SODIUM CHLORIDE 0.9 % IR SOLN
Status: DC | PRN
  Administered 2019-03-04: 6000 mL
  Administered 2019-03-04: 3000 mL

## 2019-03-04 MED ORDER — PIPERACILLIN-TAZOBACTAM 3.375 G IVPB
3.3750 g | Freq: Three times a day (TID) | INTRAVENOUS | Status: DC
  Administered 2019-03-05 – 2019-03-06 (×4): 3.375 g via INTRAVENOUS
  Filled 2019-03-04 (×5): qty 50

## 2019-03-04 MED ORDER — ONDANSETRON HCL 4 MG/2ML IJ SOLN
INTRAMUSCULAR | Status: DC | PRN
  Administered 2019-03-04: 4 mg via INTRAVENOUS

## 2019-03-04 SURGICAL SUPPLY — 47 items
BNDG CONFORM 2 STRL LF (GAUZE/BANDAGES/DRESSINGS) IMPLANT
BNDG ELASTIC 3X5.8 VLCR STR LF (GAUZE/BANDAGES/DRESSINGS) ×4 IMPLANT
BNDG ELASTIC 4X5.8 VLCR STR LF (GAUZE/BANDAGES/DRESSINGS) ×16 IMPLANT
BNDG GAUZE ELAST 4 BULKY (GAUZE/BANDAGES/DRESSINGS) ×12 IMPLANT
CORD BIPOLAR FORCEPS 12FT (ELECTRODE) ×4 IMPLANT
COVER SURGICAL LIGHT HANDLE (MISCELLANEOUS) ×4 IMPLANT
COVER WAND RF STERILE (DRAPES) IMPLANT
CUFF TOURN SGL QUICK 18X4 (TOURNIQUET CUFF) ×8 IMPLANT
CUFF TOURN SGL QUICK 24 (TOURNIQUET CUFF)
CUFF TRNQT CYL 24X4X16.5-23 (TOURNIQUET CUFF) IMPLANT
DRAIN PENROSE 1/4X12 LTX STRL (WOUND CARE) ×8 IMPLANT
DRAPE EXTREMITY T 121X128X90 (DISPOSABLE) ×4 IMPLANT
DRSG ADAPTIC 3X8 NADH LF (GAUZE/BANDAGES/DRESSINGS) IMPLANT
GAUZE SPONGE 4X4 12PLY STRL (GAUZE/BANDAGES/DRESSINGS) ×16 IMPLANT
GAUZE XEROFORM 1X8 LF (GAUZE/BANDAGES/DRESSINGS) IMPLANT
GLOVE BIOGEL M 8.0 STRL (GLOVE) ×4 IMPLANT
GLOVE SS BIOGEL STRL SZ 8 (GLOVE) ×6 IMPLANT
GLOVE SUPERSENSE BIOGEL SZ 8 (GLOVE) ×6
GOWN STRL REUS W/ TWL LRG LVL3 (GOWN DISPOSABLE) ×2 IMPLANT
GOWN STRL REUS W/ TWL XL LVL3 (GOWN DISPOSABLE) ×4 IMPLANT
GOWN STRL REUS W/TWL LRG LVL3 (GOWN DISPOSABLE) ×2
GOWN STRL REUS W/TWL XL LVL3 (GOWN DISPOSABLE) ×4
KIT BASIN OR (CUSTOM PROCEDURE TRAY) ×4 IMPLANT
KIT TURNOVER KIT B (KITS) ×4 IMPLANT
MANIFOLD NEPTUNE II (INSTRUMENTS) ×4 IMPLANT
NEEDLE HYPO 25GX1X1/2 BEV (NEEDLE) IMPLANT
NS IRRIG 1000ML POUR BTL (IV SOLUTION) ×4 IMPLANT
PACK ORTHO EXTREMITY (CUSTOM PROCEDURE TRAY) ×4 IMPLANT
PAD ARMBOARD 7.5X6 YLW CONV (MISCELLANEOUS) ×8 IMPLANT
PAD CAST 3X4 CTTN HI CHSV (CAST SUPPLIES) ×2 IMPLANT
PAD CAST 4YDX4 CTTN HI CHSV (CAST SUPPLIES) ×6 IMPLANT
PADDING CAST COTTON 3X4 STRL (CAST SUPPLIES) ×2
PADDING CAST COTTON 4X4 STRL (CAST SUPPLIES) ×6
SET IRRIG Y TYPE TUR BLADDER L (SET/KITS/TRAYS/PACK) ×4 IMPLANT
SOL PREP POV-IOD 4OZ 10% (MISCELLANEOUS) ×8 IMPLANT
SPLINT FIBERGLASS 3X12 (CAST SUPPLIES) ×4 IMPLANT
SPONGE LAP 4X18 RFD (DISPOSABLE) IMPLANT
STOCKINETTE 4X48 STRL (DRAPES) ×4 IMPLANT
SUT PROLENE 3 0 PS 2 (SUTURE) ×4 IMPLANT
SWAB CULTURE ESWAB REG 1ML (MISCELLANEOUS) ×8 IMPLANT
SYR CONTROL 10ML LL (SYRINGE) IMPLANT
TOWEL GREEN STERILE (TOWEL DISPOSABLE) ×4 IMPLANT
TOWEL GREEN STERILE FF (TOWEL DISPOSABLE) ×4 IMPLANT
TUBE CONNECTING 12'X1/4 (SUCTIONS) ×1
TUBE CONNECTING 12X1/4 (SUCTIONS) ×3 IMPLANT
WATER STERILE IRR 1000ML POUR (IV SOLUTION) ×4 IMPLANT
YANKAUER SUCT BULB TIP NO VENT (SUCTIONS) ×4 IMPLANT

## 2019-03-04 NOTE — ED Notes (Addendum)
ED TO INPATIENT HANDOFF REPORT  ED Nurse Name and Phone #: Sharene SkeansJeneen Contessa Preuss [phone]  S Name/Age/Gender Donald Murray 33 y.o. male Room/Bed: WA15/WA15  Code Status   Code Status: Full Code  Home/SNF/Other Home Patient oriented to: self, place, time and situation Is this baseline? Yes   Triage Complete: Triage complete  Chief Complaint arm swelling  Triage Note Patient is complaining of swelling and pain in both arms up to the elbows. 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 Patient has    Allergies No Known Allergies  Level of Care/Admitting Diagnosis ED Disposition    ED Disposition Condition Comment   Admit  [facility] [[id number]]  Level of Care: Med-Surg [16]  Covid Evaluation: Asymptomatic Screening Protocol (No Symptoms)  Diagnosis: Cellulitis [191478][192319]  Admitting Physician: Jacques NavyNORINS, MICHAEL E [[id number]]  Attending Physician: Illene RegulusNORINS, MICHAEL E [[id number]]  Estimated length of stay: 3 - 4 days  Certification:: I certify this patient will need inpatient services for at least 2 midnights  PT Class (Do Not Modify): Inpatient [101]  PT Acc Code (Do Not Modify): Private [1]       B Medical/Surgery History Past Medical History:  Diagnosis Date  . Hypertension    Past Surgical History:  Procedure Laterality Date  . UMBILICAL HERNIA REPAIR       A IV Location/Drains/Wounds Patient Lines/Drains/Airways Status   Active Line/Drains/Airways    Name:   Placement date:   Placement time:   Site:   Days:   Peripheral IV 03/03/19 Right Wrist   03/03/19    2059    Wrist   1          Intake/Output Last 24 hours  Intake/Output Summary (Last 24 hours) at 03/04/2019 0241 Last data filed at 03/04/2019 0044 Gross per 24 hour  Intake -  Output 225 ml  Net -225 ml    Labs/Imaging Results for orders placed or performed during the hospital encounter of 03/03/19 (from the past 48 hour(s))  Lactic acid, plasma     Status:  None   Collection Time: 03/03/19  8:41 PM  Result Value Ref Range   Lactic Acid, Venous 1.0 0.5 - 1.9 mmol/L    Comment: Performed at [facility], [address] [place], [place] [address]  Comprehensive metabolic panel     Status: Abnormal   Collection Time: 03/03/19  8:41 PM  Result Value Ref Range   Sodium 138 135 - 145 mmol/L    Comment: QUESTIONABLE RESULTS, RECOMMEND RECOLLECT TO VERIFY J.TALKINGTON,RN [id number] @0232  BY V.WILKINS    Potassium 3.3 (L) 3.5 - 5.1 mmol/L    Comment: QUESTIONABLE RESULTS, RECOMMEND RECOLLECT TO VERIFY   Chloride 105 98 - 111 mmol/L    Comment: QUESTIONABLE RESULTS, RECOMMEND RECOLLECT TO VERIFY   CO2 25 22 - 32 mmol/L    Comment: QUESTIONABLE RESULTS, RECOMMEND RECOLLECT TO VERIFY   Glucose, Bld 91 70 - 99 mg/dL    Comment: QUESTIONABLE RESULTS, RECOMMEND RECOLLECT TO VERIFY   BUN 11 6 - 20 mg/dL    Comment: QUESTIONABLE RESULTS, RECOMMEND RECOLLECT TO VERIFY   Creatinine, Ser 0.65 0.61 - 1.24 mg/dL    Comment: QUESTIONABLE RESULTS, RECOMMEND RECOLLECT TO VERIFY   Calcium 8.7 (L) 8.9 - 10.3 mg/dL    Comment: QUESTIONABLE RESULTS, RECOMMEND RECOLLECT TO VERIFY   Total Protein 7.0 6.5 - 8.1 g/dL    Comment: QUESTIONABLE RESULTS, RECOMMEND RECOLLECT TO VERIFY   Albumin 4.1 3.5 -  5.0 g/dL    Comment: QUESTIONABLE RESULTS, RECOMMEND RECOLLECT TO VERIFY   AST 14 (L) 15 - 41 U/L    Comment: QUESTIONABLE RESULTS, RECOMMEND RECOLLECT TO VERIFY   ALT 21 0 - 44 U/L    Comment: QUESTIONABLE RESULTS, RECOMMEND RECOLLECT TO VERIFY   Alkaline Phosphatase 64 38 - 126 U/L    Comment: QUESTIONABLE RESULTS, RECOMMEND RECOLLECT TO VERIFY   Total Bilirubin 0.5 0.3 - 1.2 mg/dL    Comment: QUESTIONABLE RESULTS, RECOMMEND RECOLLECT TO VERIFY   GFR calc non Af Amer >60 >60 mL/min    Comment: QUESTIONABLE RESULTS, RECOMMEND RECOLLECT TO VERIFY   GFR calc Af Amer >60 >60 mL/min    Comment: QUESTIONABLE RESULTS, RECOMMEND RECOLLECT TO VERIFY   Anion  gap 8 5 - 15    Comment: QUESTIONABLE RESULTS, RECOMMEND RECOLLECT TO VERIFY Performed at [facility], [address] [place], [place] [address]   CBC WITH DIFFERENTIAL     Status: Abnormal   Collection Time: 03/03/19  8:41 PM  Result Value Ref Range   WBC 19.8 (H) 4.0 - 10.5 K/uL    Comment: QUESTIONABLE RESULTS, RECOMMEND RECOLLECT TO VERIFY J.TALKINGTON,RN [id number] @0229  BY V.WILKINS    RBC 4.74 4.22 - 5.81 MIL/uL    Comment: QUESTIONABLE RESULTS, RECOMMEND RECOLLECT TO VERIFY   Hemoglobin 14.3 13.0 - 17.0 g/dL    Comment: QUESTIONABLE RESULTS, RECOMMEND RECOLLECT TO VERIFY   HCT 43.5 39.0 - 52.0 %    Comment: QUESTIONABLE RESULTS, RECOMMEND RECOLLECT TO VERIFY   MCV 91.8 80.0 - 100.0 fL    Comment: QUESTIONABLE RESULTS, RECOMMEND RECOLLECT TO VERIFY   MCH 30.2 26.0 - 34.0 pg    Comment: QUESTIONABLE RESULTS, RECOMMEND RECOLLECT TO VERIFY   MCHC 32.9 30.0 - 36.0 g/dL    Comment: QUESTIONABLE RESULTS, RECOMMEND RECOLLECT TO VERIFY   RDW 13.3 11.5 - 15.5 %    Comment: QUESTIONABLE RESULTS, RECOMMEND RECOLLECT TO VERIFY   Platelets 281 150 - 400 K/uL    Comment: QUESTIONABLE RESULTS, RECOMMEND RECOLLECT TO VERIFY   nRBC 0.0 0.0 - 0.2 %    Comment: QUESTIONABLE RESULTS, RECOMMEND RECOLLECT TO VERIFY   Neutrophils Relative % 60 %    Comment: QUESTIONABLE RESULTS, RECOMMEND RECOLLECT TO VERIFY   Neutro Abs 11.9 (H) 1.7 - 7.7 K/uL    Comment: QUESTIONABLE RESULTS, RECOMMEND RECOLLECT TO VERIFY   Lymphocytes Relative 30 %    Comment: QUESTIONABLE RESULTS, RECOMMEND RECOLLECT TO VERIFY   Lymphs Abs 6.0 (H) 0.7 - 4.0 K/uL    Comment: QUESTIONABLE RESULTS, RECOMMEND RECOLLECT TO VERIFY   Monocytes Relative 7 %    Comment: QUESTIONABLE RESULTS, RECOMMEND RECOLLECT TO VERIFY   Monocytes Absolute 1.3 (H) 0.1 - 1.0 K/uL    Comment: QUESTIONABLE RESULTS, RECOMMEND RECOLLECT TO VERIFY   Eosinophils Relative 2 %    Comment: QUESTIONABLE RESULTS, RECOMMEND RECOLLECT TO  VERIFY   Eosinophils Absolute 0.4 0.0 - 0.5 K/uL    Comment: QUESTIONABLE RESULTS, RECOMMEND RECOLLECT TO VERIFY   Basophils Relative 0 %   Basophils Absolute 0.1 0.0 - 0.1 K/uL    Comment: QUESTIONABLE RESULTS, RECOMMEND RECOLLECT TO VERIFY   Immature Granulocytes 1 %    Comment: QUESTIONABLE RESULTS, RECOMMEND RECOLLECT TO VERIFY   Abs Immature Granulocytes 0.12 (H) 0.00 - 0.07 K/uL    Comment: QUESTIONABLE RESULTS, RECOMMEND RECOLLECT TO VERIFY   Reactive, Benign Lymphocytes PRESENT     Comment: Performed at [facility], [address] [place], [place] [address]  Urinalysis, Routine w reflex microscopic     Status: Abnormal   Collection Time: 03/03/19  8:41 PM  Result Value Ref Range   Color, Urine BROWN (A) YELLOW   APPearance TURBID (A) CLEAR   Specific Gravity, Urine 1.029 1.005 - 1.030   pH 5.0 5.0 - 8.0   Glucose, UA NEGATIVE NEGATIVE mg/dL   Hgb urine dipstick NEGATIVE NEGATIVE   Bilirubin Urine SMALL (A) NEGATIVE   Ketones, ur 5 (A) NEGATIVE mg/dL   Protein, ur 354 (A) NEGATIVE mg/dL   Nitrite NEGATIVE NEGATIVE   Leukocytes,Ua NEGATIVE NEGATIVE   RBC / HPF 0-5 0 - 5 RBC/hpf   WBC, UA 6-10 0 - 5 WBC/hpf   Bacteria, UA FEW (A) NONE SEEN   Mucus PRESENT    Amorphous Crystal PRESENT     Comment: Performed at [facility], [address], [place], [place] [address]  CBC with Differential/Platelet     Status: Abnormal   Collection Time: 03/03/19  8:41 PM  Result Value Ref Range   WBC 17.3 (H) 4.0 - 10.5 K/uL   RBC 4.83 4.22 - 5.81 MIL/uL   Hemoglobin 12.5 (L) 13.0 - 17.0 g/dL   HCT 27.5 17.0 - 01.7 %   MCV 82.2 80.0 - 100.0 fL   MCH 25.9 (L) 26.0 - 34.0 pg   MCHC 31.5 30.0 - 36.0 g/dL   RDW 49.4 (H) 49.6 - 75.9 %   Platelets 359 150 - 400 K/uL   nRBC 0.0 0.0 - 0.2 %   Neutrophils Relative % 86 %   Neutro Abs 14.8 (H) 1.7 - 7.7 K/uL   Lymphocytes Relative 7 %   Lymphs Abs 1.3 0.7 - 4.0 K/uL   Monocytes Relative 6 %    Monocytes Absolute 1.0 0.1 - 1.0 K/uL   Eosinophils Relative 0 %   Eosinophils Absolute 0.0 0.0 - 0.5 K/uL   Basophils Relative 0 %   Basophils Absolute 0.1 0.0 - 0.1 K/uL   Immature Granulocytes 1 %   Abs Immature Granulocytes 0.18 (H) 0.00 - 0.07 K/uL    Comment: Performed at [facility], [address] [place], [place] [address]   Ct Hand Left W Contrast  Result Date: 03/03/2019 CLINICAL DATA:  Bilateral hand and forearm pain/swelling. IV drug abuse. EXAM: CT OF THE UPPER LEFT EXTREMITY WITH CONTRAST TECHNIQUE: Multidetector CT imaging of the upper left extremity was performed according to the standard protocol following intravenous contrast administration. CONTRAST:  OMNIPAQUE IOHEXOL 300 MG/ML  SOLN COMPARISON:  None. FINDINGS: Bones/Joint/Cartilage No osseous destruction or periosteal reaction. No fracture or dislocation. Normal alignment. No joint effusion. Ligaments Ligaments are suboptimally evaluated by CT. Muscles and Tendons Grossly intact. Soft tissue Severe dorsal hand and wrist soft tissue swelling. No fluid collection or hematoma. Single focus of subcutaneous gas dorsally between the third and fourth metacarpal heads. No soft tissue mass. IMPRESSION: 1. Severe dorsal hand and wrist soft tissue swelling, nonspecific, but favoring cellulitis given clinical history. No discrete abscess. 2.  No acute osseous abnormality. Electronically Signed   By: Obie Dredge M.D.   On: 03/03/2019 23:47   Dg Chest Port 1 View  Result Date: 03/03/2019 CLINICAL DATA:  Fever and sepsis. EXAM: PORTABLE CHEST 1 VIEW COMPARISON:  August 16, 2018 FINDINGS: Cardiomediastinal silhouette is normal. Mediastinal contours appear intact. There is no evidence of focal airspace consolidation, pleural effusion or pneumothorax. Osseous structures are without acute abnormality. Soft tissues are grossly normal. IMPRESSION: No active  disease. Electronically Signed   By: Fidela Salisbury M.D.    On: 03/03/2019 21:35    Pending Labs Unresulted Labs (From admission, onward)    Start     Ordered   03/11/19 0500  Creatinine, serum  (enoxaparin (LOVENOX)    CrCl >/= 30 ml/min)  Weekly,   R    Comments: while on enoxaparin therapy    03/04/19 0142   03/05/19 2585  Basic metabolic panel  Tomorrow morning,   R     03/04/19 0142   03/05/19 0500  CBC  Tomorrow morning,   R     03/04/19 0142   03/04/19 0209  CBC  Once,   R     03/04/19 0209   03/04/19 0138  HIV Antibody (routine testing w rflx)  (HIV Antibody (Routine testing w reflex) panel)  Once,   STAT     03/04/19 0142   03/04/19 0138  HIV4GL Save Tube  (HIV Antibody (Routine testing w reflex) panel)  Once,   STAT     03/04/19 0142   03/04/19 0000  SARS CORONAVIRUS 2 (TAT 6-24 HRS) Nasopharyngeal Nasopharyngeal Swab  (Symptomatic/High Risk of Exposure/Tier 1 Patients Labs with Precautions)  Once,   STAT    Question Answer Comment  Is this test for diagnosis or screening Screening   Symptomatic for COVID-19 as defined by CDC No   Hospitalized for COVID-19 No   Admitted to ICU for COVID-19 No   Previously tested for COVID-19 No   Resident in a congregate (group) care setting Yes   Employed in healthcare setting No      03/03/19 2359   03/03/19 2337  Rapid urine drug screen (hospital performed)  Add-on,   AD     03/03/19 2336   03/03/19 2041  Lactic acid, plasma  Now then every 2 hours,   STAT     03/03/19 2041   03/03/19 2041  Blood Culture (routine x 2)  BLOOD CULTURE X 2,   STAT     03/03/19 2041   03/03/19 2041  Comprehensive metabolic panel  Once,   R     03/03/19 2041          Vitals/Pain Today's Vitals   03/03/19 1955 03/03/19 2109 03/03/19 2309 03/03/19 2340  BP:  (!) 158/98  (!) 147/89  Pulse:  83  69  Resp:  (!) 21  19  Temp:  100.1 F (37.8 C)    TempSrc:  Oral    SpO2: 95% 95%  98%  Weight:      Height:      PainSc:  10-Worst pain ever 9      Isolation Precautions No active  isolations  Medications Medications  sodium chloride (PF) 0.9 % injection (has no administration in time range)  enoxaparin (LOVENOX) injection 40 mg (has no administration in time range)  sodium chloride flush (NS) 0.9 % injection 3 mL (has no administration in time range)  sodium chloride flush (NS) 0.9 % injection 3 mL (has no administration in time range)  0.9 %  sodium chloride infusion (has no administration in time range)  acetaminophen (TYLENOL) tablet 650 mg (has no administration in time range)    Or  acetaminophen (TYLENOL) suppository 650 mg (has no administration in time range)  ketorolac (TORADOL) 30 MG/ML injection 30 mg (has no administration in time range)  folic acid (FOLVITE) tablet 1 mg (has no administration in time range)  multivitamin with minerals tablet 1 tablet (has no  administration in time range)  thiamine (VITAMIN B-1) tablet 100 mg (has no administration in time range)  dicyclomine (BENTYL) tablet 20 mg (has no administration in time range)  hydrOXYzine (ATARAX/VISTARIL) tablet 25 mg (has no administration in time range)  loperamide (IMODIUM) capsule 2-4 mg (has no administration in time range)  methocarbamol (ROBAXIN) tablet 500 mg (has no administration in time range)  ondansetron (ZOFRAN-ODT) disintegrating tablet 4 mg (has no administration in time range)  cloNIDine (CATAPRES) tablet 0.1 mg (has no administration in time range)    Followed by  cloNIDine (CATAPRES) tablet 0.1 mg (has no administration in time range)    Followed by  cloNIDine (CATAPRES) tablet 0.1 mg (has no administration in time range)  acetaminophen (TYLENOL) tablet 650 mg (has no administration in time range)  traZODone (DESYREL) tablet 50 mg (has no administration in time range)  nicotine (NICODERM CQ - dosed in mg/24 hours) patch 21 mg (has no administration in time range)  sodium chloride 0.9 % bolus 1,000 mL (0 mLs Intravenous Stopped 03/04/19 0006)  HYDROmorphone (DILAUDID)  injection 1 mg (1 mg Intravenous Given 03/03/19 2135)  acetaminophen (TYLENOL) tablet 650 mg (650 mg Oral Given 03/03/19 2135)  iohexol (OMNIPAQUE) 300 MG/ML solution 100 mL (100 mLs Intravenous Contrast Given 03/03/19 2315)  vancomycin (VANCOCIN) 2,000 mg in sodium chloride 0.9 % 500 mL IVPB (2,000 mg Intravenous New Bag/Given 03/04/19 0018)  potassium chloride SA (KLOR-CON) CR tablet 40 mEq (40 mEq Oral Given 03/04/19 0015)    Mobility walks Low fall risk   Focused Assessments Gen Med   R Recommendations: See Admitting Provider Note  Report given to: Junior RN  Additional Notes:

## 2019-03-04 NOTE — Op Note (Signed)
Operative note 03/04/2019  Donald Kaufman MD.  Preoperative diagnosis #1 right elbow antecubital fossa abscess with loss of motion and function.  History of IV drug abuse #2 left hand infectious tenosynovitis loss of function and motion about the fingers and wrist  Postop diagnosis the same  Operative procedure #1 left hand thenar space abscess irrigation and debridement complicated in nature #2 radical extensor tenolysis tenosynovectomy involving second third and fourth compartments secondary to infectious tenosynovitis #3 fasciotomy right hand dorsal compartment #4 irrigation debridement deep abscess right elbow #5 fasciotomy right elbow with muscular debridement  Surgeon Donald Murray  Anesthesia General  Drains 2 Penrose drains in the left hand and 2 Penrose drains in the right elbow  Tourniquet time less than an hour on the right upper extremity  Cultures x2 aerobic anaerobic taken  Indications for the procedure this patient is a complicated 33 year old male with history of IV drug abuse he presents for evaluation and surgical management.  I discussed with him risk and benefits of surgery including risk of infection bleeding anesthesia damage to normal structures and failure of surgery to conscious intended goals of leaving symptoms and restoring function.  He has multiple track marks and areas of prior irrigation and debridement about his upper arms.  He has multiple upper and lower extremity excoriations and poor skin quality.  He is certainly a challenging patient.  He has significant infectious findings about both upper extremities.  Operative procedure: Patient was seen by myself and anesthesia taken to the operative theater underwent a smooth induction of general anesthetic I prepped the right upper extremity and left upper extremity with Hibiclens pre-scrub followed by 10-minute surgical Betadine scrub and paint tourniquets were applied and these were nonsterile.  The operation  commenced with a timeout followed by addressing the right hand.  A incision was made over the dorsal hand removal of nonviable tissue was accomplished.  I then made a counterincision in the first web.  Following this we then made an additional counterincision proximal to the dorsal wrist crease.  Dissection was carried down and abscess about the thenar space was decompressed.  Following this abscess about the first web was decompressed.  At this juncture I performed an extensor tenolysis Tina synovectomy without difficulty.  This was a radical tenolysis tenosynovectomy about the second third and fourth compartments utilizing a window technique.  I placed multiple Penrose drains.  Following this we then performed a fasciotomy of the dorsal thenar and first web area as well as the second third and fourth web spaces.  The patient tolerated this well.  This is a fasciotomy left hand.  In addition to this a radical tenosynovectomy of the infectious tendons about the left hand through multiple window techniques in terms of incisions.  At this juncture I then placed 6 L of fluid through the area.  Following this we then dressed the area sterilely.  Ultimately a short arm splint was applied.  Once this was complete and the operation on the left side was stabilized we then turned attention towards the right elbow.  The patient underwent a very careful and cautious approach to the elbow with 3 separate incisions being made dissection was carried down a large amount of infectious fluid was removed.  Following this I performed fasciotomy of the elbow musculature including the upper brachialis and lower mobile wad of 3 including brachial radialis ECRB and ECRL tendons.  The patient tolerated this well.  This was a fasciotomy of the musculature.  Following  this 3 L of saline were placed through and through the 3 separate incisions and Penrose drains were placed.  Ultimately the patient had a very careful and cautious  dressing applied without difficulty.  The patient had excellent refill to the hands and soft compartments at the conclusion of the operative endeavors.  Going forward we will continue antibiotics await cultures and recommend WaterPik to begin Monday.  Will write formal therapy notes for removal of the drains followed by [facility] and placing wick drains in the wound.  All questions have been encouraged and answered.    Donald Nudelman MD

## 2019-03-04 NOTE — Transfer of Care (Signed)
Immediate Anesthesia Transfer of Care Note  Patient: Donald Murray  Procedure(s) Performed: IRRIGATION AND DEBRIDEMENT OF LEFT HAND AND IRRIGATIN AND DEBRIDEMENT OF RIGHT ELBOW (Bilateral )  Patient Location: PACU  Anesthesia Type:General  Level of Consciousness: awake, drowsy and patient cooperative  Airway & Oxygen Therapy: Patient Spontanous Breathing and Patient connected to face mask oxygen  Post-op Assessment: Report given to RN and Post -op Vital signs reviewed and stable  Post vital signs: Reviewed and stable  Last Vitals:  Vitals Value Taken Time  BP 162/94 03/04/19 1816  Temp 36.9 C 03/04/19 1816  Pulse 88 03/04/19 1816  Resp 12 03/04/19 1816  SpO2 100 % 03/04/19 1816    Last Pain:  Vitals:   03/04/19 1400  TempSrc:   PainSc: 8       Patients Stated Pain Goal: 3 (70/35/00 9381)  Complications: No apparent anesthesia complications

## 2019-03-04 NOTE — Progress Notes (Signed)
Pharmacy Antibiotic Note  Donald Murray is a 33 y.o. male admitted on 03/03/2019 with cellulitis.  Pharmacy has been consulted for Vancomycin dosing.  Now asked to add Zosyn as well.  Plan: Vancomycin 2gm iv x1 Vancomycin 1750 mg IV Q 12 hrs. Goal AUC 400-550. Expected AUC: using 0.5 as Vd: 528 SCr used: 0.8 (adjusted)  Add zosyn 3.375g IV q 8 hrs (extended interval infusion).   Height: 5\' 11"  (180.3 cm) Weight: 240 lb (108.9 kg) IBW/kg (Calculated) : 75.3  Temp (24hrs), Avg:99 F (37.2 C), Min:98 F (36.7 C), Max:100.1 F (37.8 C)  Recent Labs  Lab 03/03/19 2041 03/03/19 2241 03/04/19 0531  WBC 17.3*  19.8*  --  16.5*  CREATININE 0.79  0.65  --   --   LATICACIDVEN 1.0 1.2  --     Estimated Creatinine Clearance: 166.3 mL/min (by C-G formula based on SCr of 0.65 mg/dL).    No Known Allergies  Antimicrobials this admission: Vancomycin 03/04/2019 >> Zosyn 10/10 >    Dose adjustments this admission: -  Microbiology results: -  Thank you for allowing pharmacy to be a part of this patient's care.  Marguerite Olea, [facility] Clinical Pharmacist Phone [phone]  03/04/2019 6:27 PM

## 2019-03-04 NOTE — Anesthesia Postprocedure Evaluation (Signed)
Anesthesia Post Note  Patient: Donald Murray  Procedure(s) Performed: IRRIGATION AND DEBRIDEMENT OF RIGHT ELBOW (Right Elbow) Irrigation And Debridement Abscess Left hand (Left Hand)     Patient location during evaluation: PACU Anesthesia Type: General Level of consciousness: awake and alert Pain management: pain level controlled Vital Signs Assessment: post-procedure vital signs reviewed and stable Respiratory status: spontaneous breathing, nonlabored ventilation and respiratory function stable Cardiovascular status: blood pressure returned to baseline and stable Postop Assessment: no apparent nausea or vomiting Anesthetic complications: no    Last Vitals:  Vitals:   03/04/19 1930 03/04/19 2028  BP: (!) 145/88 (!) 159/97  Pulse: 60 80  Resp:    Temp: 36.9 C 36.6 C  SpO2: 100% 100%    Last Pain:  Vitals:   03/04/19 2141  TempSrc:   PainSc: 7                  Catalina Gravel

## 2019-03-04 NOTE — Progress Notes (Signed)
Pt arrived to unit via [facility]. Pt in no distress. Call bell within reach. Answering service for Dr. Amedeo Plenty called to give message to him that pt has arrive to the unit.

## 2019-03-04 NOTE — ED Notes (Signed)
Pt able to stand at bedside to use urinal with no assist.

## 2019-03-04 NOTE — Anesthesia Preprocedure Evaluation (Addendum)
Anesthesia Evaluation  Patient identified by MRN, date of birth, ID band Patient awake    Reviewed: Allergy & Precautions, NPO status , Patient's Chart, lab work & pertinent test results  Airway Mallampati: II  TM Distance: >3 FB Neck ROM: Full    Dental  (+) Dental Advisory Given, Missing   Pulmonary Current Smoker and Patient abstained from smoking.,    Pulmonary exam normal breath sounds clear to auscultation       Cardiovascular hypertension, Normal cardiovascular exam Rhythm:Regular Rate:Normal     Neuro/Psych negative neurological ROS  negative psych ROS   GI/Hepatic negative GI ROS, (+)     substance abuse (heroin)  IV drug use,   Endo/Other  negative endocrine ROSObesity   Renal/GU negative Renal ROS     Musculoskeletal negative musculoskeletal ROS (+)   Abdominal   Peds  Hematology  (+) Blood dyscrasia, anemia ,   Anesthesia Other Findings Day of surgery medications reviewed with the patient.  Reproductive/Obstetrics                            Anesthesia Physical Anesthesia Plan  ASA: II  Anesthesia Plan: General   Post-op Pain Management:    Induction: Intravenous, Rapid sequence and Cricoid pressure planned  PONV Risk Score and Plan: 2 and Midazolam, Dexamethasone and Ondansetron  Airway Management Planned: Oral ETT  Additional Equipment:   Intra-op Plan:   Post-operative Plan: Extubation in OR  Informed Consent: I have reviewed the patients History and Physical, chart, labs and discussed the procedure including the risks, benefits and alternatives for the proposed anesthesia with the patient or authorized representative who has indicated his/her understanding and acceptance.     Dental advisory given  Plan Discussed with: CRNA  Anesthesia Plan Comments:        Anesthesia Quick Evaluation

## 2019-03-04 NOTE — H&P (Signed)
History and Physical    Donald Murray DVV:[id number] DOB: December 13, 1985 DOA: 03/03/2019  PCP: Patient, [name] (Confirm with patient/family/NH records and if not entered, this has to be entered at [facility] point of entry) Patient coming from: coming from home  I have personally briefly reviewed patient's old medical records in [place]  Chief Complaint: hand and arm pain  HPI: Donald Murray is a 33 y.o. male  who presents with bilateral arm pain and swelling.  Past medical history significant for IV drug abuse.  Patient states he was previously at [facility] several months ago but was kicked out because he relapsed on heroin in July 20202.  He has been using regularly in both arms and uses the same needle but does not share needles.  Over the past several days he has had increased swelling over his left hand and right elbow.  He reports severe left hand pain and has have difficulty making a fist.  He has multiple wounds over the areas where he is injected as well. He has had abscesses in the past and has had to have them drained.  He denies any drainage from the arm but it feels hot.  He is unsure if he has had a fever or not.  He denies neck pain, back pain, chest pain, shortness of breath, cough, abdominal pain, nausea, vomiting, diarrhea.  He is living in a hotel currently.  ED Course: Patient hemodynamically stable. Lab work revealed leukocytosis. Exam revealed painful injection sites and very painful left hand. CT Left arm and hand w/o deep abscess. Patient to [facility] referred for admission for injection related cellulitis both arms, left hand with multiple satellite lesions sacrum, abdomen and legs.   Review of Systems: As per HPI otherwise 10 point review of systems negative.    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 Single. From [place]. Reports that he started narcotics using pain medications, then nasal inhalation of crushed meds and the IV heroine. He was clean for several years but relpased in July 2020. 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 Living now on unemployment. Does not have a residence.    reports that he has been smoking. He has never used smokeless tobacco. He reports previous alcohol use. He reports previous drug use.  No Known Allergies  History reviewed. No pertinent family history.   Prior to Admission medications   Not on File    Physical Exam: Vitals:   03/03/19 1930 03/03/19 1955 03/03/19 2109 03/03/19 2340  BP: (!) 172/108  (!) 158/98 (!) 147/89  Pulse: 98  83 69  Resp: 16  (!) 21 19  Temp: 99.4 F (37.4 C)  100.1 F (37.8 C)   TempSrc: Oral  Oral   SpO2: 98% 95% 95% 98%  Weight: 108.9 kg     Height: 5\' 11"  (1.803 m)       Constitutional: NAD, calm, comfortable Vitals:   03/03/19 1930 03/03/19 1955 03/03/19 2109 03/03/19 2340  BP: (!) 172/108  (!) 158/98 (!) 147/89  Pulse: 98  83 69  Resp: 16  (!) 21 19  Temp: 99.4 F (37.4 C)  100.1 F (37.8 C)   TempSrc: Oral  Oral   SpO2: 98% 95% 95% 98%  Weight: 108.9 kg     Height:  5\' 11"  (1.803 m)      General appearance:  Plethoric man who report he is in pain but does not appeared distressed Eyes: PERRL, lids and conjunctivae normal ENMT: Mucous membranes are moist. Posterior pharynx clear of any exudate or lesions.Normal dentition.  Neck: normal, supple, no masses, no thyromegaly Respiratory: clear to auscultation bilaterally, no wheezing, no crackles. Normal respiratory effort. No accessory muscle use.  Cardiovascular: Regular rate and rhythm, no murmurs / rubs / gallops. No extremity edema. 2+ pedal pulses. No carotid bruits.  Abdomen: no tenderness, no masses palpated. No hepatosplenomegaly. Bowel sounds positive.  Musculoskeletal: no clubbing / cyanosis. No joint deformity upper and lower extremities. Good  ROM exept left hand - he cannot make a fist 2/2 pain and swelling, no contractures. Normal muscle tone.  Skin: no rashes. Multiple skin lesions: aantecubital fossa bilaterally right worse than left, dorsum of right hand, left hand swollend and erythematous, multiple erythematous lesions on abdomen, sacrum, shins.  Neurologic: CN 2-12 grossly intact. Sensation intact,. Strength 5/5 in all 4.  Psychiatric: Normal judgment and insight. Alert and oriented x 3. Normal mood.     Labs on Admission: I have personally reviewed following labs and imaging studies  CBC: Recent Labs  Lab 03/03/19 2041  WBC 19.8*  NEUTROABS 11.9*  HGB 14.3  HCT 43.5  MCV 91.8  PLT 281   Basic Metabolic Panel: Recent Labs  Lab 03/03/19 2041  NA 138  K 3.3*  CL 105  CO2 25  GLUCOSE 91  BUN 11  CREATININE 0.65  CALCIUM 8.7*   GFR: Estimated Creatinine Clearance: 166.3 mL/min (by C-G formula based on SCr of 0.65 mg/dL). Liver Function Tests: Recent Labs  Lab 03/03/19 2041  AST 14*  ALT 21  ALKPHOS 64  BILITOT 0.5  PROT 7.0  ALBUMIN 4.1   No results for input(s): LIPASE, AMYLASE in the last 168 hours. No results for input(s): AMMONIA in the last 168 hours. Coagulation Profile: No results for input(s): INR, PROTIME in the last 168 hours. Cardiac Enzymes: No results for input(s): CKTOTAL, CKMB, CKMBINDEX, TROPONINI in the last 168 hours. BNP (last 3 results) No results for input(s): PROBNP in the last 8760 hours. HbA1C: No results for input(s): HGBA1C in the last 72 hours. CBG: No results for input(s): GLUCAP in the last 168 hours. Lipid Profile: No results for input(s): CHOL, HDL, LDLCALC, TRIG, CHOLHDL, LDLDIRECT in the last 72 hours. Thyroid Function Tests: No results for input(s): TSH, T4TOTAL, FREET4, T3FREE, THYROIDAB in the last 72 hours. Anemia Panel: No results for input(s): VITAMINB12, FOLATE, FERRITIN, TIBC, IRON, RETICCTPCT in the last 72 hours. Urine analysis:    Component  Value Date/Time   COLORURINE BROWN (A) 03/03/2019 2041   APPEARANCEUR TURBID (A) 03/03/2019 2041   LABSPEC 1.029 03/03/2019 2041   PHURINE 5.0 03/03/2019 2041   GLUCOSEU NEGATIVE 03/03/2019 2041   HGBUR NEGATIVE 03/03/2019 2041   BILIRUBINUR SMALL (A) 03/03/2019 2041   KETONESUR 5 (A) 03/03/2019 2041   PROTEINUR 100 (A) 03/03/2019 2041   NITRITE NEGATIVE 03/03/2019 2041   LEUKOCYTESUR NEGATIVE 03/03/2019 2041    Radiological Exams on Admission: Ct Hand Left W Contrast  Result Date: 03/03/2019 CLINICAL DATA:  Bilateral hand and forearm pain/swelling. IV drug abuse. EXAM: CT OF THE UPPER LEFT EXTREMITY WITH CONTRAST TECHNIQUE: Multidetector CT imaging of the upper left extremity was performed according to the standard protocol following intravenous contrast administration. CONTRAST:  05/03/2019 OMNIPAQUE IOHEXOL 300 MG/ML  SOLN COMPARISON:  None. FINDINGS:  Bones/Joint/Cartilage No osseous destruction or periosteal reaction. No fracture or dislocation. Normal alignment. No joint effusion. Ligaments Ligaments are suboptimally evaluated by CT. Muscles and Tendons Grossly intact. Soft tissue Severe dorsal hand and wrist soft tissue swelling. No fluid collection or hematoma. Single focus of subcutaneous gas dorsally between the third and fourth metacarpal heads. No soft tissue mass. IMPRESSION: 1. Severe dorsal hand and wrist soft tissue swelling, nonspecific, but favoring cellulitis given clinical history. No discrete abscess. 2.  No acute osseous abnormality. Electronically Signed   By: Obie Dredge M.D.   On: 03/03/2019 23:47   Dg Chest Port 1 View  Result Date: 03/03/2019 CLINICAL DATA:  Fever and sepsis. EXAM: PORTABLE CHEST 1 VIEW COMPARISON:  August 16, 2018 FINDINGS: Cardiomediastinal silhouette is normal. Mediastinal contours appear intact. There is no evidence of focal airspace consolidation, pleural effusion or pneumothorax. Osseous structures are without acute abnormality. Soft tissues are  grossly normal. IMPRESSION: No active disease. Electronically Signed   By: Ted Mcalpine M.D.   On: 03/03/2019 21:35    EKG: Independently reviewed. Sinus rhythm. No acute changes  Assessment/Plan Active Problems:   Cellulitis   IVDU (intravenous drug user)  1. Cellulitis - at injection sites with multiple lesions suggestive of disseminated infection. High risk for MRSA due to IVDU.NO deep abscess Left UE by CT.  Plan Inpatient admit  Vancomycin to cover for MRSA pending blood culture results, will taylor Abx based on culture ss  For pain mgt: scheduled APAP, Ketorolac q6 prn   2. IVDU - daily heroine use since July. Has been through detox before. Plan Clonidine detox protocol  Nictoine patch for nicotine addiction  3. Hypertension - no prior h/o Hypertension. May be pain related Plan If BP remains elevated after intial treatment would initiate medical therapy  4. Social - presently homeless living in [place]. Plan SW consult   DVT prophylaxis: lovenox (Lovenox/Heparin/SCD's/anticoagulated/None (if comfort care) Code Status: full (Full/Partial (specify details) Family Communication: patient does not want family called at this time (Specify name, relationship. Do not write "discussed with patient". Specify tel # if discussed over the phone) Disposition Plan: home in 4-5 dfays (specify when and where you expect patient to be discharged) Consults called: none (with names) Admission status: inpatient (inpatient / obs / tele / medical floor / SDU)   Illene Regulus MD [facility] Pager [phone]  If 7PM-7AM, please contact night-coverage [email or web address] Password [email or web address]  03/04/2019, 2:07 AM

## 2019-03-04 NOTE — Progress Notes (Addendum)
[facility]                                                                              Patient Demographics  Donald Murray, is a 33 y.o. male, DOB - Jan 20, 1986, JOA:[id number]  Admit date - 03/03/2019   Admitting Physician Jacques Navy, MD  Outpatient Primary MD for the patient is Patient, [name]  Outpatient specialists:   LOS - 0  days   Medical records reviewed and are as summarized below:    Chief Complaint  Patient presents with  . Arm Swelling    both  . Arm Pain    both       Brief summary   Patient is a 33 year old male with history of IVDA, presenting with left hand, arm and right arm pain.  Patient reported that he was previously at [facility] house several months ago but was kicked out because he relapsed on heroin in July 2020.  He has been using regularly in both arms, uses the same needle but does not share needles.  Over the past several days, had increased swelling over the left hand, right elbow.  Patient reported severe left hand pain, having difficulty making a fist.  Patient has multiple wounds over the areas where he has injected.  He has had abscesses in the past, drained.  Not sure if he has fever. In ED, CT of the left arm showed severe dorsal hand and wrist soft tissue swelling, no discrete abscess. COVID-19 test pending  Assessment & Plan     Left hand cellulitis, right arm -Multiple lesions at the injection sites, left hand tight, unable to make fist, tender, erythematous and swollen.  Indurated area in the antecubital fossa right arm.  No deep abscess on left UE on CT -Continue IV vancomycin to cover for MRSA, follow blood cultures -Continue pain control -Patient feels worsening in the left hand swelling, continue to elevate arm -Hand surgery consulted, Dr. Amanda Pea    IVDU (intravenous drug user) -Patient counseled strongly on cessation of the IV drug use -Reports daily heroin use since July, has been through detox before  -Continue clonidine detox protocol   Nicotine use -Recommended smoking cessation, continue nicotine patch  Elevated BP -Elevated BP readings likely due to pain, no prior history of hypertension -Continue to follow closely, improving with pain control   Social -Reports homeless currently living in [place], social work consult  Code Status: Full code DVT Prophylaxis: Lovenox Family Communication: Discussed all imaging results, lab results, explained to the patient   Disposition Plan: Not medically ready, needs IV antibiotics, hand surgery evaluation Addendum: spoke with Dr Amanda Pea, recommended to transfer to [facility] for possible OR today evening for I&D. Awaiting Covid test results.  Signout given to Dr. Clyde Lundborg, patient agreeable with the plan.  Time Spent in minutes   45 minutes  Procedures:  CT left arm  Consultants:   Hand surgery, Dr. Amanda Pea  Antimicrobials:   Anti-infectives (From admission, onward)   Start     Dose/Rate Route Frequency Ordered Stop   03/04/19 1000  vancomycin (VANCOCIN) 1,750 mg in sodium chloride 0.9 % 500 mL  IVPB     1,750 mg 250 mL/hr over 120 Minutes Intravenous Every 12 hours 03/04/19 0620     03/03/19 2345  vancomycin (VANCOCIN) 2,000 mg in sodium chloride 0.9 % 500 mL IVPB     2,000 mg 250 mL/hr over 120 Minutes Intravenous  Once 03/03/19 2336 03/04/19 0824          Medications  Scheduled Meds: . acetaminophen  650 mg Oral Q8H  . cloNIDine  0.1 mg Oral QID   Followed by  . [START ON 03/06/2019] cloNIDine  0.1 mg Oral BH-qamhs   Followed by  . [START ON 03/08/2019] cloNIDine  0.1 mg Oral QAC breakfast  . enoxaparin (LOVENOX) injection  40 mg Subcutaneous Q24H  . folic acid  1 mg Oral Daily  . multivitamin with minerals  1 tablet Oral Daily  . nicotine  21 mg Transdermal Daily  . sodium chloride (PF)      . sodium chloride flush  3 mL Intravenous Q12H  . thiamine  100 mg Oral Daily  . traZODone  50 mg Oral QHS   Continuous Infusions:  . sodium chloride 250 mL (03/04/19 0945)  . vancomycin 1,750 mg (03/04/19 0946)   PRN Meds:.sodium chloride, acetaminophen **OR** acetaminophen, dicyclomine, hydrOXYzine, ketorolac, loperamide, methocarbamol, ondansetron, sodium chloride flush      Subjective:   Donald Murray was seen and examined today.  Complaining of pain in the left hand, 8/10, unable to make fist.  States swelling not improving, still red and swollen.  Also has swelling in the right upper arm, elbow area, indurated.  Spiking temp 100.1 F.  Patient denies  dizziness, chest pain, shortness of breath, abdominal pain, N/V/D/C, new weakness, numbess, tingling. No acute events overnight.    Objective:   Vitals:   03/03/19 2340 03/04/19 0000 03/04/19 0304 03/04/19 0551  BP: (!) 147/89 (!) 150/89 133/85 102/61  Pulse: 69 72 75 75  Resp: 19  18 18   Temp:   100.1 F (37.8 C) 98.8 F (37.1 C)  TempSrc:   Oral Oral  SpO2: 98% 96% 100% 100%  Weight:      Height:        Intake/Output Summary (Last 24 hours) at 03/04/2019 1022 Last data filed at 03/04/2019 0600 Gross per 24 hour  Intake 120 ml  Output 225 ml  Net -105 ml     Wt Readings from Last 3 Encounters:  03/03/19 108.9 kg  08/16/18 122.5 kg     Exam  General: Alert and oriented x 3, NAD  Eyes:  HEENT:  Atraumatic, normocephalic  Cardiovascular: S1 S2 auscultated, no murmurs, RRR  Respiratory: Clear to auscultation bilaterally, no wheezing, rales or rhonchi  Gastrointestinal: Soft, nontender, nondistended, + bowel sounds  Ext: no pedal edema bilaterally  Neuro: No new deficits  Musculoskeletal: No digital cyanosis, clubbing  Skin: Please see picture below, multiple skin lesions, indurated area in the right antecubital fossa, dorsum of the right hand, left hand swollen and erythematous, tender.  Erythematous lesions on the abdomen and shins  Psych: Normal affect and demeanor, alert and oriented x3    Right antecubital fossa abscess      LEFT HAND CELLULITIS       Data Reviewed:  I have personally reviewed following labs and imaging studies  Micro Results No results found for this or any previous visit (from the past 240 hour(s)).  Radiology Reports Ct Hand Left W Contrast  Result Date: 03/03/2019 CLINICAL DATA:  Bilateral hand and forearm pain/swelling.  IV drug abuse. EXAM: CT OF THE UPPER LEFT EXTREMITY WITH CONTRAST TECHNIQUE: Multidetector CT imaging of the upper left extremity was performed according to the standard protocol following intravenous contrast administration. CONTRAST:  121mL OMNIPAQUE IOHEXOL 300 MG/ML  SOLN COMPARISON:  None. FINDINGS: Bones/Joint/Cartilage No osseous destruction or periosteal reaction. No fracture or dislocation. Normal alignment. No joint effusion. Ligaments Ligaments are suboptimally evaluated by CT. Muscles and Tendons Grossly intact. Soft tissue Severe dorsal hand and wrist soft tissue swelling. No fluid collection or hematoma. Single focus of subcutaneous gas dorsally between the third and fourth metacarpal heads. No soft tissue mass. IMPRESSION: 1. Severe dorsal hand and wrist soft tissue swelling, nonspecific, but favoring cellulitis given clinical history. No discrete abscess. 2.  No acute osseous abnormality. Electronically Signed   By: Titus Dubin M.D.   On: 03/03/2019 23:47   Dg Chest Port 1 View  Result Date: 03/03/2019 CLINICAL DATA:  Fever and sepsis. EXAM: PORTABLE CHEST 1 VIEW COMPARISON:  August 16, 2018 FINDINGS: Cardiomediastinal silhouette is normal. Mediastinal contours appear intact. There is no evidence of focal airspace consolidation, pleural effusion or pneumothorax. Osseous structures are without acute abnormality. Soft tissues are grossly normal. IMPRESSION: No active disease. Electronically Signed   By: Fidela Salisbury M.D.   On: 03/03/2019 21:35    Lab Data:  CBC: Recent Labs  Lab 03/03/19 2041 03/04/19 0531  WBC 17.3*  19.8* 16.5*   NEUTROABS 14.8*  11.9*  --   HGB 12.5*  14.3 12.3*  HCT 39.7  43.5 37.9*  MCV 82.2  91.8 78.8*  PLT 359  281 478   Basic Metabolic Panel: Recent Labs  Lab 03/03/19 2041  NA 134*  138  K 3.3*  3.3*  CL 100  105  CO2 21*  25  GLUCOSE 84  91  BUN 11  11  CREATININE 0.79  0.65  CALCIUM 8.7*  8.7*   GFR: Estimated Creatinine Clearance: 166.3 mL/min (by C-G formula based on SCr of 0.65 mg/dL). Liver Function Tests: Recent Labs  Lab 03/03/19 2041  AST 15  14*  ALT 13  21  ALKPHOS 115  64  BILITOT 1.2  0.5  PROT 7.3  7.0  ALBUMIN 3.4*  4.1   No results for input(s): LIPASE, AMYLASE in the last 168 hours. No results for input(s): AMMONIA in the last 168 hours. Coagulation Profile: No results for input(s): INR, PROTIME in the last 168 hours. Cardiac Enzymes: No results for input(s): CKTOTAL, CKMB, CKMBINDEX, TROPONINI in the last 168 hours. BNP (last 3 results) No results for input(s): PROBNP in the last 8760 hours. HbA1C: No results for input(s): HGBA1C in the last 72 hours. CBG: No results for input(s): GLUCAP in the last 168 hours. Lipid Profile: No results for input(s): CHOL, HDL, LDLCALC, TRIG, CHOLHDL, LDLDIRECT in the last 72 hours. Thyroid Function Tests: No results for input(s): TSH, T4TOTAL, FREET4, T3FREE, THYROIDAB in the last 72 hours. Anemia Panel: No results for input(s): VITAMINB12, FOLATE, FERRITIN, TIBC, IRON, RETICCTPCT in the last 72 hours. Urine analysis:    Component Value Date/Time   COLORURINE BROWN (A) 03/03/2019 2041   APPEARANCEUR TURBID (A) 03/03/2019 2041   LABSPEC 1.029 03/03/2019 2041   PHURINE 5.0 03/03/2019 2041   GLUCOSEU NEGATIVE 03/03/2019 2041   HGBUR NEGATIVE 03/03/2019 2041   BILIRUBINUR SMALL (A) 03/03/2019 2041   KETONESUR 5 (A) 03/03/2019 2041   PROTEINUR 100 (A) 03/03/2019 2041   NITRITE NEGATIVE 03/03/2019 2041   LEUKOCYTESUR NEGATIVE 03/03/2019 2041  Thad Ranger M.D. [facility]  03/04/2019, 10:22 AM  Pager: [phone] Between 7am to 7pm - call Pager - [phone]  After 7pm go to [email or web address] - password [email or web address]  Call night coverage person covering after 7pm

## 2019-03-04 NOTE — Anesthesia Procedure Notes (Signed)
Procedure Name: Intubation Date/Time: 03/04/2019 4:53 PM Performed by: Purvis Kilts, CRNA Pre-anesthesia Checklist: Patient identified, Emergency Drugs available, Suction available, Patient being monitored and Timeout performed Patient Re-evaluated:Patient Re-evaluated prior to induction Oxygen Delivery Method: Circle system utilized Preoxygenation: Pre-oxygenation with 100% oxygen Induction Type: IV induction Ventilation: Mask ventilation without difficulty Laryngoscope Size: Mac and 4 Grade View: Grade I Tube type: Oral Tube size: 7.5 mm Number of attempts: 1 Airway Equipment and Method: Stylet Placement Confirmation: ETT inserted through vocal cords under direct vision,  positive ETCO2 and breath sounds checked- equal and bilateral Secured at: 22 cm Tube secured with: Tape Dental Injury: Teeth and Oropharynx as per pre-operative assessment

## 2019-03-04 NOTE — Consult Note (Signed)
Reason for Consult: Abscess left hand and right elbow Referring Physician: Hospitalist service  Maxim Bedel is an 33 y.o. male.  HPI: Patient presents for consultation regards to his upper extremities.  Patient has a known history of IV drug abuse.  He states it during the coronavirus.  He lost his job and relapsed.  He does not have a very stable situation in terms of his living.  He presents with left hand acute abscess and extensor tenosynovitis infectious in nature as well as a right elbow abscess in the antecubital fossa.  He is painful.  I reviewed all issues at length.  He does have needle marks in his feet and certainly multiple scars from previous I&D's of the abscess areas in his arms which have undergone surgical algorithm of care in the past.  I reviewed this with him at length and the findings.  We are planning surgical debridement today.  Past Medical History:  Diagnosis Date  . Hypertension     Past Surgical History:  Procedure Laterality Date  . UMBILICAL HERNIA REPAIR      History reviewed. No pertinent family history.  Social History:  reports that he has been smoking. He has never used smokeless tobacco. He reports previous alcohol use. He reports previous drug use.  Allergies: No Known Allergies  Medications: I have reviewed the patient's current medications.  Results for orders placed or performed during the hospital encounter of 03/03/19 (from the past 48 hour(s))  Lactic acid, plasma     Status: None   Collection Time: 03/03/19  8:41 PM  Result Value Ref Range   Lactic Acid, Venous 1.0 0.5 - 1.9 mmol/L    Comment: Performed at [facility], [address], [place], [place] [address]  Comprehensive metabolic panel     Status: Abnormal   Collection Time: 03/03/19  8:41 PM  Result Value Ref Range   Sodium 138 135 - 145 mmol/L    Comment: QUESTIONABLE RESULTS, RECOMMEND RECOLLECT TO VERIFY J.TALKINGTON,RN [id number]  BY V.WILKINS     Potassium 3.3 (L) 3.5 - 5.1 mmol/L    Comment: QUESTIONABLE RESULTS, RECOMMEND RECOLLECT TO VERIFY   Chloride 105 98 - 111 mmol/L    Comment: QUESTIONABLE RESULTS, RECOMMEND RECOLLECT TO VERIFY   CO2 25 22 - 32 mmol/L    Comment: QUESTIONABLE RESULTS, RECOMMEND RECOLLECT TO VERIFY   Glucose, Bld 91 70 - 99 mg/dL    Comment: QUESTIONABLE RESULTS, RECOMMEND RECOLLECT TO VERIFY   BUN 11 6 - 20 mg/dL    Comment: QUESTIONABLE RESULTS, RECOMMEND RECOLLECT TO VERIFY   Creatinine, Ser 0.65 0.61 - 1.24 mg/dL    Comment: QUESTIONABLE RESULTS, RECOMMEND RECOLLECT TO VERIFY   Calcium 8.7 (L) 8.9 - 10.3 mg/dL    Comment: QUESTIONABLE RESULTS, RECOMMEND RECOLLECT TO VERIFY   Total Protein 7.0 6.5 - 8.1 g/dL    Comment: QUESTIONABLE RESULTS, RECOMMEND RECOLLECT TO VERIFY   Albumin 4.1 3.5 - 5.0 g/dL    Comment: QUESTIONABLE RESULTS, RECOMMEND RECOLLECT TO VERIFY   AST 14 (L) 15 - 41 U/L    Comment: QUESTIONABLE RESULTS, RECOMMEND RECOLLECT TO VERIFY   ALT 21 0 - 44 U/L    Comment: QUESTIONABLE RESULTS, RECOMMEND RECOLLECT TO VERIFY   Alkaline Phosphatase 64 38 - 126 U/L    Comment: QUESTIONABLE RESULTS, RECOMMEND RECOLLECT TO VERIFY   Total Bilirubin 0.5 0.3 - 1.2 mg/dL    Comment: QUESTIONABLE RESULTS, RECOMMEND RECOLLECT TO VERIFY   GFR calc non Af Amer >60 >  60 mL/min    Comment: QUESTIONABLE RESULTS, RECOMMEND RECOLLECT TO VERIFY   GFR calc Af Amer >60 >60 mL/min    Comment: QUESTIONABLE RESULTS, RECOMMEND RECOLLECT TO VERIFY   Anion gap 8 5 - 15    Comment: QUESTIONABLE RESULTS, RECOMMEND RECOLLECT TO VERIFY Performed at [facility], [address] [place], [place] [address]   CBC WITH DIFFERENTIAL     Status: Abnormal   Collection Time: 03/03/19  8:41 PM  Result Value Ref Range   WBC 19.8 (H) 4.0 - 10.5 K/uL    Comment: QUESTIONABLE RESULTS, RECOMMEND RECOLLECT TO VERIFY J.TALKINGTON,RN [id number] @0229  BY V.WILKINS    RBC 4.74 4.22 - 5.81 MIL/uL    Comment:  QUESTIONABLE RESULTS, RECOMMEND RECOLLECT TO VERIFY   Hemoglobin 14.3 13.0 - 17.0 g/dL    Comment: QUESTIONABLE RESULTS, RECOMMEND RECOLLECT TO VERIFY   HCT 43.5 39.0 - 52.0 %    Comment: QUESTIONABLE RESULTS, RECOMMEND RECOLLECT TO VERIFY   MCV 91.8 80.0 - 100.0 fL    Comment: QUESTIONABLE RESULTS, RECOMMEND RECOLLECT TO VERIFY   MCH 30.2 26.0 - 34.0 pg    Comment: QUESTIONABLE RESULTS, RECOMMEND RECOLLECT TO VERIFY   MCHC 32.9 30.0 - 36.0 g/dL    Comment: QUESTIONABLE RESULTS, RECOMMEND RECOLLECT TO VERIFY   RDW 13.3 11.5 - 15.5 %    Comment: QUESTIONABLE RESULTS, RECOMMEND RECOLLECT TO VERIFY   Platelets 281 150 - 400 K/uL    Comment: QUESTIONABLE RESULTS, RECOMMEND RECOLLECT TO VERIFY   nRBC 0.0 0.0 - 0.2 %    Comment: QUESTIONABLE RESULTS, RECOMMEND RECOLLECT TO VERIFY   Neutrophils Relative % 60 %    Comment: QUESTIONABLE RESULTS, RECOMMEND RECOLLECT TO VERIFY   Neutro Abs 11.9 (H) 1.7 - 7.7 K/uL    Comment: QUESTIONABLE RESULTS, RECOMMEND RECOLLECT TO VERIFY   Lymphocytes Relative 30 %    Comment: QUESTIONABLE RESULTS, RECOMMEND RECOLLECT TO VERIFY   Lymphs Abs 6.0 (H) 0.7 - 4.0 K/uL    Comment: QUESTIONABLE RESULTS, RECOMMEND RECOLLECT TO VERIFY   Monocytes Relative 7 %    Comment: QUESTIONABLE RESULTS, RECOMMEND RECOLLECT TO VERIFY   Monocytes Absolute 1.3 (H) 0.1 - 1.0 K/uL    Comment: QUESTIONABLE RESULTS, RECOMMEND RECOLLECT TO VERIFY   Eosinophils Relative 2 %    Comment: QUESTIONABLE RESULTS, RECOMMEND RECOLLECT TO VERIFY   Eosinophils Absolute 0.4 0.0 - 0.5 K/uL    Comment: QUESTIONABLE RESULTS, RECOMMEND RECOLLECT TO VERIFY   Basophils Relative 0 %   Basophils Absolute 0.1 0.0 - 0.1 K/uL    Comment: QUESTIONABLE RESULTS, RECOMMEND RECOLLECT TO VERIFY   Immature Granulocytes 1 %    Comment: QUESTIONABLE RESULTS, RECOMMEND RECOLLECT TO VERIFY   Abs Immature Granulocytes 0.12 (H) 0.00 - 0.07 K/uL    Comment: QUESTIONABLE RESULTS, RECOMMEND RECOLLECT TO VERIFY    Reactive, Benign Lymphocytes PRESENT     Comment: Performed at [facility], [address], [place], [place] [address]  Urinalysis, Routine w reflex microscopic     Status: Abnormal   Collection Time: 03/03/19  8:41 PM  Result Value Ref Range   Color, Urine BROWN (A) YELLOW   APPearance TURBID (A) CLEAR   Specific Gravity, Urine 1.029 1.005 - 1.030   pH 5.0 5.0 - 8.0   Glucose, UA NEGATIVE NEGATIVE mg/dL   Hgb urine dipstick NEGATIVE NEGATIVE   Bilirubin Urine SMALL (A) NEGATIVE   Ketones, ur 5 (A) NEGATIVE mg/dL   Protein, ur 433 (A) NEGATIVE mg/dL   Nitrite NEGATIVE  NEGATIVE   Leukocytes,Ua NEGATIVE NEGATIVE   RBC / HPF 0-5 0 - 5 RBC/hpf   WBC, UA 6-10 0 - 5 WBC/hpf   Bacteria, UA FEW (A) NONE SEEN   Mucus PRESENT    Amorphous Crystal PRESENT     Comment: Performed at [facility], [address], [place], [place] [address]  CBC with Differential/Platelet     Status: Abnormal   Collection Time: 03/03/19  8:41 PM  Result Value Ref Range   WBC 17.3 (H) 4.0 - 10.5 K/uL   RBC 4.83 4.22 - 5.81 MIL/uL   Hemoglobin 12.5 (L) 13.0 - 17.0 g/dL   HCT 38.1 82.9 - 93.7 %   MCV 82.2 80.0 - 100.0 fL   MCH 25.9 (L) 26.0 - 34.0 pg   MCHC 31.5 30.0 - 36.0 g/dL   RDW 16.9 (H) 67.8 - 93.8 %   Platelets 359 150 - 400 K/uL   nRBC 0.0 0.0 - 0.2 %   Neutrophils Relative % 86 %   Neutro Abs 14.8 (H) 1.7 - 7.7 K/uL   Lymphocytes Relative 7 %   Lymphs Abs 1.3 0.7 - 4.0 K/uL   Monocytes Relative 6 %   Monocytes Absolute 1.0 0.1 - 1.0 K/uL   Eosinophils Relative 0 %   Eosinophils Absolute 0.0 0.0 - 0.5 K/uL   Basophils Relative 0 %   Basophils Absolute 0.1 0.0 - 0.1 K/uL   Immature Granulocytes 1 %   Abs Immature Granulocytes 0.18 (H) 0.00 - 0.07 K/uL    Comment: Performed at [facility], [address] [place], [place] [address]  Comprehensive metabolic panel     Status: Abnormal   Collection Time: 03/03/19  8:41 PM  Result Value Ref  Range   Sodium 134 (L) 135 - 145 mmol/L   Potassium 3.3 (L) 3.5 - 5.1 mmol/L   Chloride 100 98 - 111 mmol/L   CO2 21 (L) 22 - 32 mmol/L   Glucose, Bld 84 70 - 99 mg/dL   BUN 11 6 - 20 mg/dL   Creatinine, Ser 1.02 0.61 - 1.24 mg/dL   Calcium 8.7 (L) 8.9 - 10.3 mg/dL   Total Protein 7.3 6.5 - 8.1 g/dL   Albumin 3.4 (L) 3.5 - 5.0 g/dL   AST 15 15 - 41 U/L   ALT 13 0 - 44 U/L   Alkaline Phosphatase 115 38 - 126 U/L   Total Bilirubin 1.2 0.3 - 1.2 mg/dL   GFR calc non Af Amer >60 >60 mL/min   GFR calc Af Amer >60 >60 mL/min   Anion gap 13 5 - 15    Comment: Performed at [facility], [address], [place], [place] [address]  Lactic acid, plasma     Status: None   Collection Time: 03/03/19 10:41 PM  Result Value Ref Range   Lactic Acid, Venous 1.2 0.5 - 1.9 mmol/L    Comment: Performed at [facility], [address] [place], [place] [address]  Rapid urine drug screen (hospital performed)     Status: Abnormal   Collection Time: 03/03/19 11:37 PM  Result Value Ref Range   Opiates POSITIVE (A) NONE DETECTED   Cocaine NONE DETECTED NONE DETECTED   Benzodiazepines NONE DETECTED NONE DETECTED   Amphetamines POSITIVE (A) NONE DETECTED   Tetrahydrocannabinol NONE DETECTED NONE DETECTED   Barbiturates NONE DETECTED NONE DETECTED    Comment: (NOTE) DRUG SCREEN FOR MEDICAL PURPOSES ONLY.  IF CONFIRMATION IS  NEEDED FOR ANY PURPOSE, NOTIFY LAB WITHIN 5 DAYS. LOWEST DETECTABLE LIMITS FOR URINE DRUG SCREEN Drug Class                     Cutoff (ng/mL) Amphetamine and metabolites    1000 Barbiturate and metabolites    200 Benzodiazepine                 818 Tricyclics and metabolites     300 Opiates and metabolites        300 Cocaine and metabolites        300 THC                            50 Performed at [facility], [place] [address], [place], [place] [address]   HIV Antibody (routine testing w rflx)     Status: None    Collection Time: 03/04/19  1:38 AM  Result Value Ref Range   HIV Screen 4th Generation wRfx NON REACTIVE NON REACTIVE    Comment: Performed at [facility] [address], [place], [place] [address]  CBC     Status: Abnormal   Collection Time: 03/04/19  5:31 AM  Result Value Ref Range   WBC 16.5 (H) 4.0 - 10.5 K/uL   RBC 4.81 4.22 - 5.81 MIL/uL   Hemoglobin 12.3 (L) 13.0 - 17.0 g/dL   HCT 37.9 (L) 39.0 - 52.0 %   MCV 78.8 (L) 80.0 - 100.0 fL   MCH 25.6 (L) 26.0 - 34.0 pg   MCHC 32.5 30.0 - 36.0 g/dL   RDW 15.5 11.5 - 15.5 %   Platelets 356 150 - 400 K/uL   nRBC 0.0 0.0 - 0.2 %    Comment: Performed at [facility], [place] [address] [place], [place] [address]  SARS Coronavirus 2 by RT PCR (hospital order, performed in [place] hospital lab)     Status: None   Collection Time: 03/04/19  8:15 AM  Result Value Ref Range   SARS Coronavirus 2 NEGATIVE NEGATIVE    Comment: (NOTE) If result is NEGATIVE SARS-CoV-2 target nucleic acids are NOT DETECTED. The SARS-CoV-2 RNA is generally detectable in upper and lower  respiratory specimens during the acute phase of infection. The lowest  concentration of SARS-CoV-2 viral copies this assay can detect is 250  copies / mL. A negative result does not preclude SARS-CoV-2 infection  and should not be used as the sole basis for treatment or other  patient management decisions.  A negative result may occur with  improper specimen collection / handling, submission of specimen other  than nasopharyngeal swab, presence of viral mutation(s) within the  areas targeted by this assay, and inadequate number of viral copies  (<250 copies / mL). A negative result must be combined with clinical  observations, patient history, and epidemiological information. If result is POSITIVE SARS-CoV-2 target nucleic acids are DETECTED. The SARS-CoV-2 RNA is generally detectable in upper and lower  respiratory specimens dur ing the acute  phase of infection.  Positive  results are indicative of active infection with SARS-CoV-2.  Clinical  correlation with patient history and other diagnostic information is  necessary to determine patient infection status.  Positive results do  not rule out bacterial infection or co-infection with other viruses. If result is PRESUMPTIVE POSTIVE SARS-CoV-2 nucleic acids MAY BE PRESENT.   A presumptive positive result was obtained on the submitted  specimen  and confirmed on repeat testing.  While 2019 novel coronavirus  (SARS-CoV-2) nucleic acids may be present in the submitted sample  additional confirmatory testing may be necessary for epidemiological  and / or clinical management purposes  to differentiate between  SARS-CoV-2 and other Sarbecovirus currently known to infect humans.  If clinically indicated additional testing with an alternate test  methodology [phone]([id number]) is advised. The SARS-CoV-2 RNA is generally  detectable in upper and lower respiratory sp ecimens during the acute  phase of infection. The expected result is Negative. Fact Sheet for Patients:  BoilerBrush.com.cyhttps://www.fda.gov/media/136312/download Fact Sheet for Healthcare Providers: https://pope.com/https://www.fda.gov/media/136313/download This test is not yet approved or cleared by the [place] FDA and has been authorized for detection and/or diagnosis of SARS-CoV-2 by FDA under an Emergency Use Authorization (EUA).  This EUA will remain in effect (meaning this test can be used) for the duration of the COVID-19 declaration under Section 564(b)(1) of the Act, 21 U.S.C. section 360bbb-3(b)(1), unless the authorization is terminated or revoked sooner. Performed at [facility], [address] [place], [place] [id number]     Ct Hand Left W Contrast  Result Date: 03/03/2019 CLINICAL DATA:  Bilateral hand and forearm pain/swelling. IV drug abuse. EXAM: CT OF THE UPPER LEFT EXTREMITY WITH CONTRAST TECHNIQUE: Multidetector  CT imaging of the upper left extremity was performed according to the standard protocol following intravenous contrast administration. CONTRAST:  100mL OMNIPAQUE IOHEXOL 300 MG/ML  SOLN COMPARISON:  None. FINDINGS: Bones/Joint/Cartilage No osseous destruction or periosteal reaction. No fracture or dislocation. Normal alignment. No joint effusion. Ligaments Ligaments are suboptimally evaluated by CT. Muscles and Tendons Grossly intact. Soft tissue Severe dorsal hand and wrist soft tissue swelling. No fluid collection or hematoma. Single focus of subcutaneous gas dorsally between the third and fourth metacarpal heads. No soft tissue mass. IMPRESSION: 1. Severe dorsal hand and wrist soft tissue swelling, nonspecific, but favoring cellulitis given clinical history. No discrete abscess. 2.  No acute osseous abnormality. Electronically Signed   By: Obie DredgeWilliam T Derry M.D.   On: 03/03/2019 23:47   Dg Chest Port 1 View  Result Date: 03/03/2019 CLINICAL DATA:  Fever and sepsis. EXAM: PORTABLE CHEST 1 VIEW COMPARISON:  August 16, 2018 FINDINGS: Cardiomediastinal silhouette is normal. Mediastinal contours appear intact. There is no evidence of focal airspace consolidation, pleural effusion or pneumothorax. Osseous structures are without acute abnormality. Soft tissues are grossly normal. IMPRESSION: No active disease. Electronically Signed   By: Ted Mcalpineobrinka  Dimitrova M.D.   On: 03/03/2019 21:35    Review of Systems  Respiratory: Negative for hemoptysis and sputum production.   Cardiovascular: Negative.   Gastrointestinal: Negative.   Genitourinary: Negative.    Blood pressure 114/67, pulse 67, temperature 98 F (36.7 C), temperature source Oral, resp. rate 16, height 5\' 11"  (1.803 m), weight 108.9 kg, SpO2 99 %. Physical Exam left hand has multiple marks as does the elbow and upper arm in terms of prior IV drug abuse.  He has multiple scars.  He has a large area of tenderness and fluctuance over the dorsal hand  indicative of an abscess with extensor tenosynovitis infectious in nature.  He is painful and cannot move the hand appreciably due to the pain.  His right antecubital fossa has a similar appearance with high Reuland's and a abscess.  This will need a surgical irrigation debridement as well I discussed these issues with the patient and the plan of care.  He has track marks about his right and left  upper extremities and some degree of track marks in the lower extremities which I reviewed.  Chest is clear abdomen is nontender.  He has no evidence of instability about the bony architecture.  Assessment/Plan: We will plan for irrigation debridement right antecubital fossa with repair reconstruction is necessary and fasciotomy as necessary.  We will also plan for left hand radical extensor tenolysis Tina synovectomy and repair reconstruction with evacuation of the abscess and any infectious material  He will be continued on an admit status.  We will plan for cultures.  I discussed all issues with the patient at length including the plan of care which he desires to proceed with.  We are planning surgery for your upper extremity. The risk and benefits of surgery to include risk of bleeding, infection, anesthesia,  damage to normal structures and failure of the surgery to accomplish its intended goals of relieving symptoms and restoring function have been discussed in detail. With this in mind we plan to proceed. I have specifically discussed with the patient the pre-and postoperative regime and the dos and don'ts and risk and benefits in great detail. Risk and benefits of surgery also include risk of dystrophy(CRPS), chronic nerve pain, failure of the healing process to go onto completion and other inherent risks of surgery The relavent the pathophysiology of the disease/injury process, as well as the alternatives for treatment and postoperative course of action has been discussed in great detail with the  patient who desires to proceed.  We will do everything in our power to help you (the patient) restore function to the upper extremity. It is a pleasure to see this patient today.   Dionne AnoWilliam M Tekila Caillouet III 03/04/2019, 4:27 PM

## 2019-03-04 NOTE — ED Notes (Signed)
Patient taken up by tech. 

## 2019-03-04 NOTE — Progress Notes (Signed)
Pharmacy Antibiotic Note  Donald Murray is a 33 y.o. male admitted on 03/03/2019 with cellulitis.  Pharmacy has been consulted for Vancomycin dosing.  Plan: Vancomycin 2gm iv x1 Vancomycin 1750 mg IV Q 12 hrs. Goal AUC 400-550. Expected AUC: using 0.5 as Vd: 528 SCr used: 0.8 (adjusted)   Height: 5\' 11"  (180.3 cm) Weight: 240 lb (108.9 kg) IBW/kg (Calculated) : 75.3  Temp (24hrs), Avg:99.6 F (37.6 C), Min:98.8 F (37.1 C), Max:100.1 F (37.8 C)  Recent Labs  Lab 03/03/19 2041 03/03/19 2241 03/04/19 0531  WBC 17.3*  19.8*  --  16.5*  CREATININE 0.79  0.65  --   --   LATICACIDVEN 1.0 1.2  --     Estimated Creatinine Clearance: 166.3 mL/min (by C-G formula based on SCr of 0.65 mg/dL).    No Known Allergies  Antimicrobials this admission: Vancomycin 03/04/2019 >>   Dose adjustments this admission: -  Microbiology results: -  Thank you for allowing pharmacy to be a part of this patient's care.  Nani Skillern Crowford 03/04/2019 6:20 AM

## 2019-03-05 ENCOUNTER — Encounter (HOSPITAL_COMMUNITY): Payer: Self-pay | Admitting: Orthopedic Surgery

## 2019-03-05 ENCOUNTER — Inpatient Hospital Stay (HOSPITAL_COMMUNITY): Payer: Self-pay

## 2019-03-05 DIAGNOSIS — I361 Nonrheumatic tricuspid (valve) insufficiency: Secondary | ICD-10-CM

## 2019-03-05 LAB — ECHOCARDIOGRAM COMPLETE
Height: 71 in
Weight: 3840 oz

## 2019-03-05 LAB — BASIC METABOLIC PANEL
Anion gap: 8 (ref 5–15)
BUN: 11 mg/dL (ref 6–20)
CO2: 23 mmol/L (ref 22–32)
Calcium: 8.2 mg/dL — ABNORMAL LOW (ref 8.9–10.3)
Chloride: 108 mmol/L (ref 98–111)
Creatinine, Ser: 0.71 mg/dL (ref 0.61–1.24)
GFR calc Af Amer: >60 mL/min (ref >60)
GFR calc non Af Amer: >60 mL/min (ref >60)
Glucose, Bld: 150 mg/dL — ABNORMAL HIGH (ref 70–99)
Potassium: 4.2 mmol/L (ref 3.5–5.1)
Sodium: 139 mmol/L (ref 135–145)

## 2019-03-05 LAB — CBC
HCT: 37.7 % — ABNORMAL LOW (ref 39.0–52.0)
Hemoglobin: 12.3 g/dL — ABNORMAL LOW (ref 13.0–17.0)
MCH: 25.9 pg — ABNORMAL LOW (ref 26.0–34.0)
MCHC: 32.6 g/dL (ref 30.0–36.0)
MCV: 79.4 fL — ABNORMAL LOW (ref 80.0–100.0)
Platelets: 285 10*3/uL (ref 150–400)
RBC: 4.75 MIL/uL (ref 4.22–5.81)
RDW: 15.7 % — ABNORMAL HIGH (ref 11.5–15.5)
WBC: 13.1 10*3/uL — ABNORMAL HIGH (ref 4.0–10.5)
nRBC: 0 % (ref 0.0–0.2)

## 2019-03-05 MED ORDER — CHLORHEXIDINE GLUCONATE CLOTH 2 % EX PADS
6.0000 | MEDICATED_PAD | Freq: Every day | CUTANEOUS | Status: DC
  Administered 2019-03-05 – 2019-03-06 (×2): 6 via TOPICAL

## 2019-03-05 MED ORDER — MUPIROCIN 2 % EX OINT
1.0000 "application " | TOPICAL_OINTMENT | Freq: Two times a day (BID) | CUTANEOUS | Status: DC
  Administered 2019-03-05 – 2019-03-06 (×3): 1 via NASAL
  Filled 2019-03-05: qty 22

## 2019-03-05 NOTE — Plan of Care (Signed)
  Problem: Education: Goal: Knowledge of General Education information will improve Description: Including pain rating scale, medication(s)/side effects and non-pharmacologic comfort measures Outcome: Progressing   Problem: Clinical Measurements: Goal: Will remain free from infection Outcome: Progressing   Problem: Activity: Goal: Risk for activity intolerance will decrease Outcome: Progressing   Problem: Coping: Goal: Level of anxiety will decrease Outcome: Progressing   Problem: Pain Managment: Goal: General experience of comfort will improve Outcome: Progressing   

## 2019-03-05 NOTE — Progress Notes (Signed)
Patient ID: Donald Murray, male   DOB: [birth year], 33 y.o.   MRN: [id number] Patient is seen and evaluated at bedside.  Patient has good refill to the fingers and moves the fingers on the right and left upper extremities without difficulty.  There is some loss of motion which is expected with terminal flexion and extension attempts.  At present time he looks as well as expected  I discussed with him the next steps involved in his care plan.  At present juncture he endorses improvement in his pain and feels generally better.  Going forward I would recommend PT hydrotherapy to begin tomorrow.  We will plan to remove all drains.  He has 2 Penrose drains in his right antecubital fossa and 2 Penrose drains in his left hand and wrist forearm region.  We will remove these drains and begin WaterPik followed by wet-to-dry wick dressing changes with iodoform gauze.  We should perform this daily until he is able to competently perform this at home.  Once he is ready to perform dressing changes at home and cultures are back he can be transition to an oral antibiotic and DC.  I would recommend case management consult given his difficult and challenging social condition.  At present juncture he is stable and awake.  Hopefully he will have a speedy resolution however the patient is fairly poorly conditioned.  Unfortunately he has been down this road quite a few times with infections due to IV drug abuse.  I have discussed with him all issues and findings.  All questions have been encouraged and answered.  Erla Bacchi MD

## 2019-03-05 NOTE — Progress Notes (Signed)
*  PRELIMINARY RESULTS* Echocardiogram 2D Echocardiogram has been performed.  Donald Murray 03/05/2019, 5:51 PM

## 2019-03-05 NOTE — Progress Notes (Signed)
CSW spoke with bedside to acknowledge homeless and substance use consult. Pateint informed CSW that he lives in hotels due to still receiving unemployment. CSW inquired about his substance use and he stated that he was a resident of [place] and he plans to return when able. Patient was amenable to receiving homelesss as well as substance use resources.

## 2019-03-05 NOTE — Progress Notes (Signed)
PROGRESS NOTE    Donald Murray  ZOX:[id number] DOB: [birth year] DOA: 03/03/2019 PCP: Patient, [name] Brief Narrative:  33-year-old gentleman prior history of IV drug abuse presents with left hand pain and right arm pain patient reports he has been injecting IV drugs into the right elbow and left hand. CT of the left arm shows severe dorsal hand and wrist soft tissue swelling. He was found to have right elbow antecubital fossa abscess and left hand tenosynovitis.  [facility] and [facility] for further irrigation and debridement of the right arm and left hand by Dr. Amanda PeaGramig. Patient seen and examined today he reports his pain is optimally controlled with pain medication. Assessment & Plan:   Active Problems:   Cellulitis   IVDU (intravenous drug user)   Right elbow antecubital fossa and left hand tenosynovitis with surrounding cellulitis secondary to IV drug abuse Patient currently on broad-spectrum IV antibiotics underwent irrigation and debridement of the right elbow abscess and left hand tenosynovitis.  In view of high risk for MRSA we will go ahead and get an echocardiogram to rule out endocarditis.  Blood cultures are pending. Pain control and physical therapy.  Appreciate orthopedics recommendations.    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    Elevated blood pressures probably new onset essential hypertension We will start the patient on PRN hydralazine for now.    Social worker consulted for homeless situation.   DVT prophylaxis: Lovenox Code Status: Full code Family Communication: None at bedside  disposition Plan: Pending clinical improvement and awaiting cultures from the Consultants:   Hand surgery  Dr. Amanda PeaGramig  Procedures:  Left hand thenar space abscess irrigation and debridement Radical extensor Tinolysis tenosynovectomy involving second, third and fourth  compartments secondary to infectious tenosynovitis Fasciotomy of the right hand dorsal compartment Irrigation and debridement of the deep abscess of the right elbow Fasciotomy of the right elbow with muscular debridement By Dr. Amanda PeaGramig on 03/04/2019 Antimicrobials: IV vancomycin and Zosyn since admission  Subjective: Patient reports pain is tolerable and is elevating his arm.  Objective: Vitals:   03/05/19 0016 03/05/19 0525 03/05/19 0804 03/05/19 1145  BP: 137/76 (!) 141/80 (!) 152/85 (!) 143/78  Pulse: 70 63 64 62  Resp:   18 18  Temp: 98.1 F (36.7 C) 97.9 F (36.6 C) 98.3 F (36.8 C) 98.1 F (36.7 C)  TempSrc: Oral Oral Oral Oral  SpO2: 100% 99% 100% 99%  Weight:      Height:        Intake/Output Summary (Last 24 hours) at 03/05/2019 1312 Last data filed at 03/05/2019 1246 Gross per 24 hour  Intake 3030.33 ml  Output 1000 ml  Net 2030.33 ml   Filed Weights   03/03/19 1930  Weight: 108.9 kg    Examination:  General exam: Appears calm and comfortable  Respiratory system: Clear to auscultation. Respiratory effort normal. Cardiovascular system: S1 & S2 heard, RRR. No JVD, murmurs, Gastrointestinal system: Abdomen is nondistended, soft and nontender. No organomegaly or masses felt. Normal bowel sounds heard. Central nervous system: Alert and oriented. No focal neurological deficits. Extremities: Left arm bandaged Skin: No rashes, lesions or ulcers Psychiatry:. Mood & affect appropriate.     Data Reviewed: I have personally reviewed following labs and imaging studies  CBC: Recent Labs  Lab 03/03/19 2041 03/04/19 0531 03/05/19 0301  WBC 17.3*  19.8* 16.5* 13.1*  NEUTROABS 14.8*  11.9*  --   --   HGB 12.5*  14.3 12.3* 12.3*  HCT 39.7  43.5 37.9* 37.7*  MCV 82.2  91.8 78.8* 79.4*  PLT 359  281 356 285   Basic Metabolic Panel: Recent Labs  Lab 03/03/19 2041 03/05/19 0301  NA 134*  138 139  K 3.3*  3.3* 4.2  CL 100  105 108  CO2 21*  25 23   GLUCOSE 84  91 150*  BUN 11  11 11   CREATININE 0.79  0.65 0.71  CALCIUM 8.7*  8.7* 8.2*   GFR: Estimated Creatinine Clearance: 166.3 mL/min (by C-G formula based on SCr of 0.71 mg/dL). Liver Function Tests: Recent Labs  Lab 03/03/19 2041  AST 15  14*  ALT 13  21  ALKPHOS 115  64  BILITOT 1.2  0.5  PROT 7.3  7.0  ALBUMIN 3.4*  4.1   No results for input(s): LIPASE, AMYLASE in the last 168 hours. No results for input(s): AMMONIA in the last 168 hours. Coagulation Profile: No results for input(s): INR, PROTIME in the last 168 hours. Cardiac Enzymes: No results for input(s): CKTOTAL, CKMB, CKMBINDEX, TROPONINI in the last 168 hours. BNP (last 3 results) No results for input(s): PROBNP in the last 8760 hours. HbA1C: No results for input(s): HGBA1C in the last 72 hours. CBG: No results for input(s): GLUCAP in the last 168 hours. Lipid Profile: No results for input(s): CHOL, HDL, LDLCALC, TRIG, CHOLHDL, LDLDIRECT in the last 72 hours. Thyroid Function Tests: No results for input(s): TSH, T4TOTAL, FREET4, T3FREE, THYROIDAB in the last 72 hours. Anemia Panel: No results for input(s): VITAMINB12, FOLATE, FERRITIN, TIBC, IRON, RETICCTPCT in the last 72 hours. Sepsis Labs: Recent Labs  Lab 03/03/19 2041 03/03/19 2241  LATICACIDVEN 1.0 1.2    Recent Results (from the past 240 hour(s))  SARS Coronavirus 2 by RT PCR (hospital order, performed in [facility] hospital lab)     Status: None   Collection Time: 03/04/19  8:15 AM  Result Value Ref Range Status   SARS Coronavirus 2 NEGATIVE NEGATIVE Final    Comment: (NOTE) If result is NEGATIVE SARS-CoV-2 target nucleic acids are NOT DETECTED. The SARS-CoV-2 RNA is generally detectable in upper and lower  respiratory specimens during the acute phase of infection. The lowest  concentration of SARS-CoV-2 viral copies this assay can detect is 250  copies / mL. A negative result does not preclude SARS-CoV-2 infection  and  should not be used as the sole basis for treatment or other  patient management decisions.  A negative result may occur with  improper specimen collection / handling, submission of specimen other  than nasopharyngeal swab, presence of viral mutation(s) within the  areas targeted by this assay, and inadequate number of viral copies  (<250 copies / mL). A negative result must be combined with clinical  observations, patient history, and epidemiological information. If result is POSITIVE SARS-CoV-2 target nucleic acids are DETECTED. The SARS-CoV-2 RNA is generally detectable in upper and lower  respiratory specimens dur ing the acute phase of infection.  Positive  results are indicative of active infection with SARS-CoV-2.  Clinical  correlation with patient history and other diagnostic information is  necessary to determine patient infection status.  Positive results do  not rule out bacterial infection or co-infection with other viruses. If result is PRESUMPTIVE POSTIVE SARS-CoV-2 nucleic acids MAY BE PRESENT.   A presumptive positive result was obtained on the submitted specimen  and confirmed on repeat  testing.  While 2019 novel coronavirus  (SARS-CoV-2) nucleic acids may be present in the submitted sample  additional confirmatory testing may be necessary for epidemiological  and / or clinical management purposes  to differentiate between  SARS-CoV-2 and other Sarbecovirus currently known to infect humans.  If clinically indicated additional testing with an alternate test  methodology [phone]) is advised. The SARS-CoV-2 RNA is generally  detectable in upper and lower respiratory sp ecimens during the acute  phase of infection. The expected result is Negative. Fact Sheet for Patients:  StrictlyIdeas.no Fact Sheet for Healthcare Providers: BankingDealers.co.za This test is not yet approved or cleared by the [place] FDA and has been  authorized for detection and/or diagnosis of SARS-CoV-2 by FDA under an Emergency Use Authorization (EUA).  This EUA will remain in effect (meaning this test can be used) for the duration of the COVID-19 declaration under Section 564(b)(1) of the Act, 21 U.S.C. section 360bbb-3(b)(1), unless the authorization is terminated or revoked sooner. Performed at [facility], [place] [address] [place], [place] [address]   Surgical pcr screen     Status: Abnormal   Collection Time: 03/04/19  1:59 PM   Specimen: Nasal Mucosa; Nasal Swab  Result Value Ref Range Status   MRSA, PCR NEGATIVE NEGATIVE Final   Staphylococcus aureus POSITIVE (A) NEGATIVE Final    Comment: (NOTE) The Xpert SA Assay (FDA approved for NASAL specimens in patients 4 years of age and older), is one component of a comprehensive surveillance program. It is not intended to diagnose infection nor to guide or monitor treatment. Performed at [facility], [place] [address] [place], [place] [address]   Aerobic/Anaerobic Culture (surgical/deep wound)     Status: None (Preliminary result)   Collection Time: 03/04/19  7:22 PM   Specimen: Other Source; Tissue  Result Value Ref Range Status   Specimen Description TISSUE LEFT HAND  Final   Special Requests TENDON SHEATH ID B PATIENT ON FOLLOWING VANCOMYCIN  Final   Gram Stain NO WBC SEEN NO ORGANISMS SEEN   Final   Culture   Final    NO GROWTH < 12 HOURS Performed at [facility], [place] [address] [place], [place] [address]    Report Status PENDING  Incomplete  Aerobic/Anaerobic Culture (surgical/deep wound)     Status: None (Preliminary result)   Collection Time: 03/04/19  7:23 PM   Specimen: Other Source; Tissue  Result Value Ref Range Status   Specimen Description TISSUE LEFT HAND  Final   Special Requests TENDON SHEATH ID A PATIENT ON FOLLOWING VANCOMYCIN  Final   Gram Stain   Final    FEW WBC PRESENT,BOTH PMN AND MONONUCLEAR NO ORGANISMS SEEN     Culture   Final    NO GROWTH < 12 HOURS Performed at [facility], [place] [address] [place], [place] [address]    Report Status PENDING  Incomplete         Radiology Studies: Ct Hand Left W Contrast  Result Date: 03/03/2019 CLINICAL DATA:  Bilateral hand and forearm pain/swelling. IV drug abuse. EXAM: CT OF THE UPPER LEFT EXTREMITY WITH CONTRAST TECHNIQUE: Multidetector CT imaging of the upper left extremity was performed according to the standard protocol following intravenous contrast administration. CONTRAST:  1106mL OMNIPAQUE IOHEXOL 300 MG/ML  SOLN COMPARISON:  None. FINDINGS: Bones/Joint/Cartilage No osseous destruction or periosteal reaction. No fracture or dislocation. Normal alignment. No joint effusion. Ligaments Ligaments are suboptimally evaluated by CT. Muscles and  Tendons Grossly intact. Soft tissue Severe dorsal hand and wrist soft tissue swelling. No fluid collection or hematoma. Single focus of subcutaneous gas dorsally between the third and fourth metacarpal heads. No soft tissue mass. IMPRESSION: 1. Severe dorsal hand and wrist soft tissue swelling, nonspecific, but favoring cellulitis given clinical history. No discrete abscess. 2.  No acute osseous abnormality. Electronically Signed   By: Obie Dredge M.D.   On: 03/03/2019 23:47   Dg Chest Port 1 View  Result Date: 03/03/2019 CLINICAL DATA:  Fever and sepsis. EXAM: PORTABLE CHEST 1 VIEW COMPARISON:  August 16, 2018 FINDINGS: Cardiomediastinal silhouette is normal. Mediastinal contours appear intact. There is no evidence of focal airspace consolidation, pleural effusion or pneumothorax. Osseous structures are without acute abnormality. Soft tissues are grossly normal. IMPRESSION: No active disease. Electronically Signed   By: Ted Mcalpine M.D.   On: 03/03/2019 21:35        Scheduled Meds: . acetaminophen  650 mg Oral Q8H  . Chlorhexidine Gluconate Cloth  6 each Topical Daily  . cloNIDine  0.1 mg Oral  QID   Followed by  . [START ON 03/06/2019] cloNIDine  0.1 mg Oral BH-qamhs   Followed by  . [START ON 03/08/2019] cloNIDine  0.1 mg Oral QAC breakfast  . enoxaparin (LOVENOX) injection  40 mg Subcutaneous Q24H  . folic acid  1 mg Oral Daily  . multivitamin with minerals  1 tablet Oral Daily  . mupirocin ointment  1 application Nasal BID  . nicotine  21 mg Transdermal Daily  . sodium chloride flush  3 mL Intravenous Q12H  . thiamine  100 mg Oral Daily  . traZODone  50 mg Oral QHS   Continuous Infusions: . sodium chloride Stopped (03/04/19 1221)  . sodium chloride 100 mL/hr at 03/04/19 1219  . lactated ringers    . piperacillin-tazobactam (ZOSYN)  IV 3.375 g (03/05/19 1115)  . vancomycin 1,750 mg (03/05/19 1118)     LOS: 1 day        Kathlen Mody, MD [facility] Pager [phone]  If 7PM-7AM, please contact night-coverage [email or web address] Password [email or web address] 03/05/2019, 1:12 PM

## 2019-03-05 NOTE — Plan of Care (Signed)
  Problem: Clinical Measurements: Goal: Will remain free from infection Outcome: Progressing   Problem: Activity: Goal: Risk for activity intolerance will decrease Outcome: Progressing   Problem: Coping: Goal: Level of anxiety will decrease Outcome: Progressing   

## 2019-03-06 LAB — CBC
HCT: 36.8 % — ABNORMAL LOW (ref 39.0–52.0)
Hemoglobin: 11.7 g/dL — ABNORMAL LOW (ref 13.0–17.0)
MCH: 25.9 pg — ABNORMAL LOW (ref 26.0–34.0)
MCHC: 31.8 g/dL (ref 30.0–36.0)
MCV: 81.4 fL (ref 80.0–100.0)
Platelets: 303 10*3/uL (ref 150–400)
RBC: 4.52 MIL/uL (ref 4.22–5.81)
RDW: 15.9 % — ABNORMAL HIGH (ref 11.5–15.5)
WBC: 8.7 10*3/uL (ref 4.0–10.5)
nRBC: 0 % (ref 0.0–0.2)

## 2019-03-06 LAB — BASIC METABOLIC PANEL
Anion gap: 8 (ref 5–15)
BUN: 10 mg/dL (ref 6–20)
CO2: 21 mmol/L — ABNORMAL LOW (ref 22–32)
Calcium: 8 mg/dL — ABNORMAL LOW (ref 8.9–10.3)
Chloride: 111 mmol/L (ref 98–111)
Creatinine, Ser: 0.67 mg/dL (ref 0.61–1.24)
GFR calc Af Amer: >60 mL/min (ref >60)
GFR calc non Af Amer: >60 mL/min (ref >60)
Glucose, Bld: 87 mg/dL (ref 70–99)
Potassium: 3.5 mmol/L (ref 3.5–5.1)
Sodium: 140 mmol/L (ref 135–145)

## 2019-03-06 NOTE — Progress Notes (Signed)
Patient states he is leaving AMA because he has stuff to take care of at home. Educated patient of need to continue with treatment and patient verbalized understanding but insisted on leaving. Advised him to come back through ED if he changes his mind and want to continue with treatment. MD notified .PIV on Rt foot d/c'd. Dressings to BIL UE CDI. Pt signed AMA paper.

## 2019-03-06 NOTE — Progress Notes (Signed)
PT Cancellation Note  Patient Details Name: Donald Murray MRN: [id number] DOB: 1986/01/19   Cancelled Treatment:    Reason Eval/Treat Not Completed: Other (comment). Per nurse pt is leaving AMA. Will be available if he changes his mind.   Shary Decamp [facility] 03/06/2019, 8:50 AM [place] Pager [phone] Office [phone]

## 2019-03-06 NOTE — Progress Notes (Addendum)
Complained of mid abdominal pain last night, describes pain as a stabbing pain.  Tells nurse that he has had this pain in the past and attributes it to recurring abdominal wall hernias.  Monitoring.

## 2019-03-06 NOTE — Plan of Care (Signed)

## 2019-03-09 LAB — CULTURE, BLOOD (ROUTINE X 2)
Culture: NO GROWTH
Culture: NO GROWTH
Special Requests: ADEQUATE
Special Requests: ADEQUATE

## 2019-03-09 LAB — AEROBIC/ANAEROBIC CULTURE W GRAM STAIN (SURGICAL/DEEP WOUND): Gram Stain: NONE SEEN

## 2019-03-15 NOTE — Discharge Summary (Signed)
Physician Discharge Summary  Donald Murray QBH:[id number] DOB: 03-Sep-1985 DOA: 03/03/2019  PCP: Patient, [name]  Admit date: 03/03/2019 Discharge date: 03/06/2019  Patient left AMA.     Brief/Interim Summary: 33 year old gentleman prior history of IV drug abuse presents with left hand pain and right arm pain patient reports he has been injecting IV drugs into the right elbow and left hand. CT of the left arm shows severe dorsal hand and wrist soft tissue swelling. He was found to have right elbow antecubital fossa abscess and left hand tenosynovitis.  He was admitted to [facility] service at [facility] long and transferred to [facility] for further irrigation and debridement of the right arm and left hand by Dr. Amanda Pea.  Discharge Diagnoses:  Active Problems:   Cellulitis   IVDU (intravenous drug user)  Right elbow antecubital fossa and left hand tenosynovitis with surrounding cellulitis secondary to IV drug abuse Patient started on broad-spectrum IV antibiotics, underwent irrigation and debridement of the right elbow abscess and left hand tenosynovitis.   In view of high risk for MRSA ordered an echocardiogram to rule out endocarditis.  Blood cultures done an dpending.  Pain control and physical therapy.  Appreciate orthopedics recommendations.    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  Elevated blood pressures probably new onset essential hypertension  Social worker consulted for homeless situation.   Pt left AMA, without being seen.    Discharge Instructions   Allergies as of 03/06/2019   No Known Allergies     Medication List    You have not been prescribed any medications.     No Known Allergies  Consultations:  Orthopedics.   Procedures/Studies: Ct Hand Left W Contrast  Result Date: 03/03/2019 CLINICAL DATA:  Bilateral hand and forearm pain/swelling. IV drug abuse. EXAM: CT OF THE UPPER LEFT EXTREMITY  WITH CONTRAST TECHNIQUE: Multidetector CT imaging of the upper left extremity was performed according to the standard protocol following intravenous contrast administration. CONTRAST:  OMNIPAQUE IOHEXOL 300 MG/ML  SOLN COMPARISON:  None. FINDINGS: Bones/Joint/Cartilage No osseous destruction or periosteal reaction. No fracture or dislocation. Normal alignment. No joint effusion. Ligaments Ligaments are suboptimally evaluated by CT. Muscles and Tendons Grossly intact. Soft tissue Severe dorsal hand and wrist soft tissue swelling. No fluid collection or hematoma. Single focus of subcutaneous gas dorsally between the third and fourth metacarpal heads. No soft tissue mass. IMPRESSION: 1. Severe dorsal hand and wrist soft tissue swelling, nonspecific, but favoring cellulitis given clinical history. No discrete abscess. 2.  No acute osseous abnormality. Electronically Signed   By: Obie Dredge M.D.   On: 03/03/2019 23:47   Dg Chest Port 1 View  Result Date: 03/03/2019 CLINICAL DATA:  Fever and sepsis. EXAM: PORTABLE CHEST 1 VIEW COMPARISON:  August 16, 2018 FINDINGS: Cardiomediastinal silhouette is normal. Mediastinal contours appear intact. There is no evidence of focal airspace consolidation, pleural effusion or pneumothorax. Osseous structures are without acute abnormality. Soft tissues are grossly normal. IMPRESSION: No active disease. Electronically Signed   By: Ted Mcalpine M.D.   On: 03/03/2019 21:35      Discharge Exam: Vitals:   03/06/19 0309 03/06/19 0803  BP: (!) 156/88 (!) 160/92  Pulse: 65 71  Resp: 16 18  Temp: 97.8 F (36.6 C) 98.1 F (36.7 C)  SpO2: 98% 96%   Vitals:   03/05/19 1441 03/05/19 2016 03/06/19 0309 03/06/19 0803  BP: (!) 152/76 108/78 (!) 156/88 (!) 160/92  Pulse: 62 71 65 71  Resp: 18  16 18   Temp: 98.5 F (36.9 C) 98.3 F (36.8 C) 97.8 F (36.6 C) 98.1 F (36.7 C)  TempSrc: Oral Oral Oral Oral  SpO2: 100% 100% 98% 96%  Weight:      Height:             The results of significant diagnostics from this hospitalization (including imaging, microbiology, ancillary and laboratory) are listed below for reference.     Microbiology: No results found for this or any previous visit (from the past 240 hour(s)).   Labs: BNP (last 3 results) No results for input(s): BNP in the last 8760 hours. Basic Metabolic Panel: No results for input(s): NA, K, CL, CO2, GLUCOSE, BUN, CREATININE, CALCIUM, MG, PHOS in the last 168 hours. Liver Function Tests: No results for input(s): AST, ALT, ALKPHOS, BILITOT, PROT, ALBUMIN in the last 168 hours. No results for input(s): LIPASE, AMYLASE in the last 168 hours. No results for input(s): AMMONIA in the last 168 hours. CBC: No results for input(s): WBC, NEUTROABS, HGB, HCT, MCV, PLT in the last 168 hours. Cardiac Enzymes: No results for input(s): CKTOTAL, CKMB, CKMBINDEX, TROPONINI in the last 168 hours. BNP: Invalid input(s): POCBNP CBG: No results for input(s): GLUCAP in the last 168 hours. D-Dimer No results for input(s): DDIMER in the last 72 hours. Hgb A1c No results for input(s): HGBA1C in the last 72 hours. Lipid Profile No results for input(s): CHOL, HDL, LDLCALC, TRIG, CHOLHDL, LDLDIRECT in the last 72 hours. Thyroid function studies No results for input(s): TSH, T4TOTAL, T3FREE, THYROIDAB in the last 72 hours.  Invalid input(s): FREET3 Anemia work up No results for input(s): VITAMINB12, FOLATE, FERRITIN, TIBC, IRON, RETICCTPCT in the last 72 hours. Urinalysis    Component Value Date/Time   COLORURINE BROWN (A) 03/03/2019 2041   APPEARANCEUR TURBID (A) 03/03/2019 2041   LABSPEC 1.029 03/03/2019 2041   PHURINE 5.0 03/03/2019 2041   GLUCOSEU NEGATIVE 03/03/2019 2041   HGBUR NEGATIVE 03/03/2019 2041   BILIRUBINUR SMALL (A) 03/03/2019 2041   KETONESUR 5 (A) 03/03/2019 2041   PROTEINUR 100 (A) 03/03/2019 2041   NITRITE NEGATIVE 03/03/2019 2041   LEUKOCYTESUR NEGATIVE 03/03/2019 2041    Sepsis Labs Invalid input(s): PROCALCITONIN,  WBC,  LACTICIDVEN Microbiology No results found for this or any previous visit (from the past 240 hour(s)).     SIGNED:   Hosie Poisson, MD  [facility] 03/15/2019, 8:29 AM Pager   If 7PM-7AM, please contact night-coverage [email or web address] Password [email or web address]

## 2019-03-22 ENCOUNTER — Telehealth: Payer: Self-pay | Admitting: *Deleted

## 2019-03-22 NOTE — Telephone Encounter (Signed)
Pt called regarding location of Rx from his last admission.  Pt admits that he left AMA and was under the impression that MD would e-scribe Rx to pharmacy.  EDCM reviewed chart and did not find evidence of Rx e-scribed.  Relayed information to patient.

## 2020-02-16 ENCOUNTER — Encounter (HOSPITAL_COMMUNITY): Payer: Self-pay

## 2020-02-16 ENCOUNTER — Emergency Department (HOSPITAL_COMMUNITY): Payer: Self-pay

## 2020-02-16 ENCOUNTER — Emergency Department (HOSPITAL_COMMUNITY)
Admission: EM | Admit: 2020-02-16 | Discharge: 2020-02-16 | Disposition: A | Discharge status: Home / Self Care | Payer: Self-pay | Attending: Emergency Medicine | Admitting: Emergency Medicine

## 2020-02-16 ENCOUNTER — Emergency Department (HOSPITAL_BASED_OUTPATIENT_CLINIC_OR_DEPARTMENT_OTHER): Payer: Self-pay

## 2020-02-16 DIAGNOSIS — I110 Hypertensive heart disease with heart failure: Secondary | ICD-10-CM | POA: Insufficient documentation

## 2020-02-16 DIAGNOSIS — Z20822 Contact with and (suspected) exposure to covid-19: Secondary | ICD-10-CM | POA: Insufficient documentation

## 2020-02-16 DIAGNOSIS — R0600 Dyspnea, unspecified: Secondary | ICD-10-CM

## 2020-02-16 DIAGNOSIS — I5031 Acute diastolic (congestive) heart failure: Secondary | ICD-10-CM

## 2020-02-16 DIAGNOSIS — K0889 Other specified disorders of teeth and supporting structures: Secondary | ICD-10-CM | POA: Insufficient documentation

## 2020-02-16 DIAGNOSIS — F17290 Nicotine dependence, other tobacco product, uncomplicated: Secondary | ICD-10-CM | POA: Insufficient documentation

## 2020-02-16 DIAGNOSIS — I160 Hypertensive urgency: Secondary | ICD-10-CM

## 2020-02-16 DIAGNOSIS — I509 Heart failure, unspecified: Secondary | ICD-10-CM | POA: Insufficient documentation

## 2020-02-16 HISTORY — DX: Snoring: R06.83

## 2020-02-16 HISTORY — DX: Obesity, unspecified: E66.9

## 2020-02-16 LAB — CBC WITH DIFFERENTIAL/PLATELET
Abs Immature Granulocytes: 0.07 10*3/uL (ref 0.00–0.07)
Basophils Absolute: 0.1 10*3/uL (ref 0.0–0.1)
Basophils Relative: 1 %
Eosinophils Absolute: 0.2 10*3/uL (ref 0.0–0.5)
Eosinophils Relative: 2 %
HCT: 43.7 % (ref 39.0–52.0)
Hemoglobin: 14.5 g/dL (ref 13.0–17.0)
Immature Granulocytes: 1 %
Lymphocytes Relative: 22 %
Lymphs Abs: 2.1 10*3/uL (ref 0.7–4.0)
MCH: 27.9 pg (ref 26.0–34.0)
MCHC: 33.2 g/dL (ref 30.0–36.0)
MCV: 84.2 fL (ref 80.0–100.0)
Monocytes Absolute: 0.6 10*3/uL (ref 0.1–1.0)
Monocytes Relative: 6 %
Neutro Abs: 6.8 10*3/uL (ref 1.7–7.7)
Neutrophils Relative %: 68 %
Platelets: 294 10*3/uL (ref 150–400)
RBC: 5.19 MIL/uL (ref 4.22–5.81)
RDW: 14.6 % (ref 11.5–15.5)
WBC: 9.8 10*3/uL (ref 4.0–10.5)
nRBC: 0 % (ref 0.0–0.2)

## 2020-02-16 LAB — URINALYSIS, ROUTINE W REFLEX MICROSCOPIC
Bilirubin Urine: NEGATIVE
Glucose, UA: NEGATIVE mg/dL
Ketones, ur: NEGATIVE mg/dL
Leukocytes,Ua: NEGATIVE
Nitrite: NEGATIVE
Protein, ur: 30 mg/dL — AB
Specific Gravity, Urine: 1.031 — ABNORMAL HIGH (ref 1.005–1.030)
pH: 5 (ref 5.0–8.0)

## 2020-02-16 LAB — BASIC METABOLIC PANEL
Anion gap: 9 (ref 5–15)
BUN: 16 mg/dL (ref 6–20)
CO2: 23 mmol/L (ref 22–32)
Calcium: 8.4 mg/dL — ABNORMAL LOW (ref 8.9–10.3)
Chloride: 108 mmol/L (ref 98–111)
Creatinine, Ser: 0.8 mg/dL (ref 0.61–1.24)
GFR calc Af Amer: >60 mL/min (ref >60)
GFR calc non Af Amer: >60 mL/min (ref >60)
Glucose, Bld: 91 mg/dL (ref 70–99)
Potassium: 3.6 mmol/L (ref 3.5–5.1)
Sodium: 140 mmol/L (ref 135–145)

## 2020-02-16 LAB — RESPIRATORY PANEL BY RT PCR (FLU A&B, COVID)
Influenza A by PCR: NEGATIVE
Influenza B by PCR: NEGATIVE
SARS Coronavirus 2 by RT PCR: NEGATIVE

## 2020-02-16 LAB — RAPID URINE DRUG SCREEN, HOSP PERFORMED
Amphetamines: NOT DETECTED
Barbiturates: NOT DETECTED
Benzodiazepines: NOT DETECTED
Cocaine: NOT DETECTED
Opiates: NOT DETECTED
Tetrahydrocannabinol: NOT DETECTED

## 2020-02-16 LAB — ECHOCARDIOGRAM COMPLETE
Area-P 1/2: 2.69 cm2
S' Lateral: 4.9 cm

## 2020-02-16 LAB — BRAIN NATRIURETIC PEPTIDE: B Natriuretic Peptide: 409 pg/mL — ABNORMAL HIGH (ref 0.0–100.0)

## 2020-02-16 MED ORDER — PENICILLIN V POTASSIUM 500 MG PO TABS: 500.0000 mg | ORAL_TABLET | Freq: Four times a day (QID) | ORAL | 0 refills | Status: AC

## 2020-02-16 MED ORDER — FUROSEMIDE 20 MG PO TABS: 20.0000 mg | ORAL_TABLET | Freq: Every day | ORAL | 0 refills | Status: DC

## 2020-02-16 MED ORDER — FUROSEMIDE 10 MG/ML IJ SOLN
20.0000 mg | Freq: Two times a day (BID) | INTRAMUSCULAR | Status: DC
  Administered 2020-02-16: 20 mg via INTRAVENOUS
  Filled 2020-02-16: qty 4

## 2020-02-16 NOTE — Consult Note (Addendum)
Cardiology Consultation:   Patient ID: [name] MRN: [id number]; DOB: [birth year]  Admit date: 02/16/2020 Date of Consult: 02/16/2020  Primary Care Provider: Patient, [name] [facility] Cardiologist: Olga Millers, MD [facility] Electrophysiologist:  None    Patient Profile:   Donald Murray is a 34 y.o. male with a hx of IVDU (heroin), tobacco use, and HTN who is being seen today for the evaluation of CHF at the request of Dr. Lynelle Doctor.  History of Present Illness:   Donald Murray has a history of IVDU, was seen 03/06/2019 for cellulitis at injection sites, but left AMA. He presented to [facility] this morning with SOB x 2 days and dental pain. He attempted to go to the dentist yesterday, but was turned away due to HTN. SOB is worse with exertion and the patient describes PND, relieved with cough. On arrival, CBC stable, no anemia or leukocytosis. BMP unremarkable with the exception of hypocalcemia of 8.4 - looks to be chronic. BNP elevated to 409, cardiology consulted.   On my interview, he has been sober for 10 months without drug use with the exception of vaping. 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 Starting Tues night, he had onset of shortness of breath with cough. SOB was not relieved or worsened with position change. Sitting up or sleeping in a recliner did not improve his symptoms. He denies chest pain. No sick contacts. He is vaccinated against COVID-19, last shot in August.  He has not seen a regular doctor in several years. He does not take medication at home. He is unaware of his tachycardia or PVCs. No family history of heart disease, but HTN and DM on both sides.  He does snore, has never had a sleep study.   Past Medical History:  Diagnosis Date  . Hypertension     Past Surgical History:  Procedure Laterality Date  . I & D EXTREMITY Right 03/04/2019   Procedure: IRRIGATION AND DEBRIDEMENT OF RIGHT ELBOW;  Surgeon: Dominica Severin, MD;  Location: [facility] OR;  Service:  Orthopedics;  Laterality: Right;  . IRRIGATION AND DEBRIDEMENT ABSCESS Left 03/04/2019   Procedure: Irrigation And Debridement Abscess Left hand;  Surgeon: Dominica Severin, MD;  Location: [facility] OR;  Service: Orthopedics;  Laterality: Left;  . UMBILICAL HERNIA REPAIR       Home Medications:  Prior to Admission medications   Medication Sig Start Date End Date Taking? Authorizing Provider  ibuprofen (ADVIL) 200 MG tablet Take 200 mg by mouth every 6 (six) hours as needed for fever or moderate pain.   Yes [provider]    Inpatient Medications: Scheduled Meds:  Continuous Infusions:  PRN Meds:   Allergies:   No Known Allergies  Social History:   Social History   Socioeconomic History  . Marital status: Single    Spouse name: Not on file  . Number of children: Not on file  . Years of education: Not on file  . Highest education level: Not on file  Occupational History  . Not on file  Tobacco Use  . Smoking status: Current Every Day Smoker  . Smokeless tobacco: Never Used  Vaping Use  . Vaping Use: Every day  . Substances: Nicotine  Substance and Sexual Activity  . 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 Drug use: Not Currently    Comment: Pt stated "It was opiates"  . Sexual activity: Not on file  Other Topics Concern  .  Not on file  Social History Narrative  . Not on file   Social Determinants of Health   Financial Resource Strain:   . Difficulty of Paying Living Expenses: Not on file  Food Insecurity:   . Worried About Programme researcher, broadcasting/film/video in the Last Year: Not on file  . Ran Out of Food in the Last Year: Not on file  Transportation Needs:   . Lack of Transportation (Medical): Not on file  . Lack of Transportation (Non-Medical): Not on file  Physical Activity:   . Days of Exercise per Week: Not on file  . Minutes of Exercise per Session: Not on file  Stress:   . Feeling of Stress : Not on file  Social Connections:   .  Frequency of Communication with Friends and Family: Not on file  . Frequency of Social Gatherings with Friends and Family: Not on file  . Attends Religious Services: Not on file  . Active Member of Clubs or Organizations: Not on file  . Attends Banker Meetings: Not on file  . Marital Status: Not on file  Intimate Partner Violence:   . Fear of Current or Ex-Partner: Not on file  . Emotionally Abused: Not on file  . Physically Abused: Not on file  . Sexually Abused: Not on file    Family History:   Family History  Problem Relation Age of Onset  . Hypertension Mother   . Hypertension Father      ROS:  Please see the history of present illness.   All other ROS reviewed and negative.     Physical Exam/Data:   Vitals:   02/16/20 0425 02/16/20 0758 02/16/20 0758 02/16/20 1048  BP: (!) 174/128  (!) 145/112 122/79  Pulse: 60  (!) 102 (!) 103  Resp: (!) 22  (!) 26 (!) 21  Temp: 97.8 F (36.6 C)     TempSrc: Oral     SpO2: 97% 97% 97% 95%   No intake or output data in the 24 hours ending 02/16/20 1111 Last 3 Weights 03/03/2019 08/16/2018  Weight (lbs) 240 lb 270 lb  Weight (kg) 108.863 kg 122.471 kg     There is no height or weight on file to calculate BMI.  General: obese male in no acute distress HEENT: normal Neck: no JVD Vascular: No carotid bruits  Cardiac:  Regular rhythm, tachycardic rate Lungs:  clear to auscultation bilaterally, no wheezing, rhonchi or rales  Abd: soft, nontender, no hepatomegaly  Ext: no edema Musculoskeletal:  No deformities, BUE and BLE strength normal and equal Skin: warm and dry  Neuro:  CNs 2-12 intact, no focal abnormalities noted Psych:  Normal affect   EKG:  The EKG was personally reviewed and demonstrates:  Sinus tachycardia HR 116 Telemetry:  Telemetry was personally reviewed and demonstrates:  Sinus tachycardia in the 100s, frequent PVCs  Relevant CV Studies:  Echo pending today  Laboratory Data:  High Sensitivity  Troponin:  No results for input(s): TROPONINIHS in the last 720 hours.   Chemistry Recent Labs  Lab 02/16/20 0754  NA 140  K 3.6  CL 108  CO2 23  GLUCOSE 91  BUN 16  CREATININE 0.80  CALCIUM 8.4*  GFRNONAA >60  GFRAA >60  ANIONGAP 9    No results for input(s): PROT, ALBUMIN, AST, ALT, ALKPHOS, BILITOT in the last 168 hours. Hematology Recent Labs  Lab 02/16/20 0754  WBC 9.8  RBC 5.19  HGB 14.5  HCT 43.7  MCV 84.2  MCH 27.9  MCHC 33.2  RDW 14.6  PLT 294   BNP Recent Labs  Lab 02/16/20 0754  BNP 409.0*    DDimer No results for input(s): DDIMER in the last 168 hours.   Radiology/Studies:  DG Chest 2 View  Result Date: 02/16/2020 CLINICAL DATA:  Shortness of breath EXAM: CHEST - 2 VIEW COMPARISON:  03/03/2019 FINDINGS: Normal for technique heart size. Diffuse interstitial opacity with Kerley lines and fissure thickening. Lung volumes are low. No effusion. No pneumothorax. IMPRESSION: CHF pattern. Electronically Signed   By: Marnee Spring M.D.   On: 02/16/2020 06:03    New York Heart Association (NYHA) Functional Class NYHA Class IV  Assessment and Plan:   Shortness of breath  PND - BNP elevated to 409 - CXR with edema - he does not appear extremely volume up on exam, but body habitus likely masking fluid - will start 40 mg IV lasix while waiting for echo, appears comfortable and is not requiring O2   Hypertension - presenting BP 174/128 --> 145/112 --> 122/79 - no home medications - medication selection depends on echo results - would favor starting carvedilol for now   Sinus tachycardia PVCs - asymptomatic - unclear etiology - can check a THS, Mg - start BB as above while exploring etiology of ST - may be related to tooth pain +/- CHF   Hx of IVDU - has been sober for 10 months - congratulated him on this - echo pending - check valves   Snoring Obesity - suggested outpatient sleep study   Disposition Needs to establish with PCP. I  do not have concerns about medication compliance at this time. He is interested in treating his medical problems.  For questions or updates, please contact [facility] Please consult [email or web address] for contact info under   Signed, Marcelino Duster, PA  02/16/2020 11:11 AM As above, patient seen and examined.  Briefly he is a 34 year old male with past medical history of IV drug use (states he has been clean for 11 months), hypertension, tobacco abuse for evaluation of congestive heart failure.  He has no prior cardiac history.  Patient had dental pain approximately 2 weeks ago.  Over the past 3 to 4 days he has noticed worsening dyspnea on exertion, orthopnea and PND.  He denies pedal edema, chest pain, fevers or chills.  No productive cough hemoptysis.  He did travel to [place] in late August.  Cardiology now asked to evaluate.  Initial blood pressure 174/128.  Electrocardiogram shows sinus tachycardia.  Chest x-ray shows CHF.  Red blood cells noted in urine.  BNP 409.  Creatinine 0.80.  1 CHF-patient presents with CHF symptoms for 3 to 4 days.  He does not appear to be significantly volume overloaded on examination.  We will treat with Lasix 20 mg IV twice daily.  Follow renal function closely.  Arrange echocardiogram to assess LV function.  Further medication adjustment based on ejection fraction.  2 hypertensive urgency-initial blood pressure 174/128 but has improved since admission.  We will follow and adjust regimen as needed.  3 hematuria-further evaluation per primary care.  4 history of heroin use-patient denies any use in the past 11 months.  We will check drug screen.  Olga Millers, MD

## 2020-02-16 NOTE — ED Triage Notes (Signed)
Patient arrived with complaints of shortness of breath and fatigue over the last two days. Also has complaints of bottom left dental pain, went to the dentist today but they would not see him due to hypertension.

## 2020-02-16 NOTE — Consult Note (Signed)
[facility] Initial Consultation Note   Patient Name: Donald Murray    LGX:[id number] PCP: Patient, [name]     DOB: 07/14/1985  DOA: 02/16/2020 DOS: the patient was seen and examined on 02/16/2020   Referring physician: Dr Lynelle Doctor, Jodi Geralds Reason for consult: admission for CHF  HPI: Donald Murray is a 34 y.o. male with Past medical history of . Patient is coming from Home  PATIENT IS LEAVING AMA 34 year old male with past medical history of having drug abuse (heroin), tobacco abuse hypertension as well as obesity presents with complaints of shortness of breath ongoing for last 2 days. He actually went to see her dentist office for dental pain.  He had some shortness of breath and high blood pressure and therefore he was referred to the emergency department. On arrival the patient was tachycardic and tachypneic. At the time of my evaluation he denied any chest pain or shortness of breath. Patient tells me before giving him me any other review of system that he has to go home tonight and he cannot stay in the hospital tonight. I explained to him regarding the reason for admission as well as the requirement. EDP provided the risk of leaving the hospital AGAINST MEDICAL ADVICE.  Review of Systems: as mentioned in the history of present illness.  All other systems reviewed and are negative.  Past Medical History:  Diagnosis Date  . Hypertension   . Obesity   . Snoring    Past Surgical History:  Procedure Laterality Date  . I & D EXTREMITY Right 03/04/2019   Procedure: IRRIGATION AND DEBRIDEMENT OF RIGHT ELBOW;  Surgeon: Dominica Severin, MD;  Location: [facility] OR;  Service: Orthopedics;  Laterality: Right;  . IRRIGATION AND DEBRIDEMENT ABSCESS Left 03/04/2019   Procedure: Irrigation And Debridement Abscess Left hand;  Surgeon: Dominica Severin, MD;  Location: [facility] OR;  Service: Orthopedics;  Laterality: Left;  . UMBILICAL HERNIA REPAIR     Social History:  reports that he has been  smoking. He has never used smokeless tobacco. He reports previous alcohol use. He reports previous drug use.  No Known Allergies   Family History  Problem Relation Age of Onset  . Hypertension Mother   . Hypertension Father     Prior to Admission medications   Medication Sig Start Date End Date Taking? Authorizing Provider  ibuprofen (ADVIL) 200 MG tablet Take 200 mg by mouth every 6 (six) hours as needed for fever or moderate pain.   Yes [provider]  furosemide (LASIX) 20 MG tablet Take 1 tablet (20 mg total) by mouth daily. Take additional 20 mg daily for edema or weight gain of 3 lbs in 1 day or 5 lbs in 2 days. 02/16/20 02/15/21  Rolly Salter, MD  penicillin v potassium (VEETID) 500 MG tablet Take 1 tablet (500 mg total) by mouth 4 (four) times daily for 7 days. 02/16/20 02/23/20  Dartha Lodge, PA-C    Physical Exam: Vitals:   02/16/20 1330 02/16/20 1430 02/16/20 1500 02/16/20 1600  BP: 108/81 (!) 131/109 (!) 133/99 (!) 127/108  Pulse: (!) 104 63 (!) 104 (!) 105  Resp: (!) 29 18 18 18   Temp:      TempSrc:      SpO2: 95% 99% 96% 95%   General: alert and oriented to time, place, and person. Appear in mild distress, affect appropriate Other examination limited as patient desired to leave the hospital AMA.  Labs:  CBC: Recent Labs  Lab 02/16/20  0754  WBC 9.8  NEUTROABS 6.8  HGB 14.5  HCT 43.7  MCV 84.2  PLT 294   Basic Metabolic Panel: Recent Labs  Lab 02/16/20 0754  NA 140  K 3.6  CL 108  CO2 23  GLUCOSE 91  BUN 16  CREATININE 0.80  CALCIUM 8.4*   Liver Function Tests: No results for input(s): AST, ALT, ALKPHOS, BILITOT, PROT, ALBUMIN in the last 168 hours. No results for input(s): LIPASE, AMYLASE in the last 168 hours. No results for input(s): AMMONIA in the last 168 hours.  Cardiac Enzymes: No results for input(s): CKTOTAL, CKMB, CKMBINDEX, TROPONINI in the last 168 hours. No results for input(s): PROBNP in the last 8760  hours.  CBG: No results for input(s): GLUCAP in the last 168 hours.  Radiological Exams: DG Chest 2 View  Result Date: 02/16/2020 CLINICAL DATA:  Shortness of breath EXAM: CHEST - 2 VIEW COMPARISON:  03/03/2019 FINDINGS: Normal for technique heart size. Diffuse interstitial opacity with Kerley lines and fissure thickening. Lung volumes are low. No effusion. No pneumothorax. IMPRESSION: CHF pattern. Electronically Signed   By: Marnee Spring M.D.   On: 02/16/2020 06:03   ECHOCARDIOGRAM COMPLETE  Result Date: 02/16/2020    ECHOCARDIOGRAM REPORT   Patient Name:   Donald Murray Date of Exam: 02/16/2020 Medical Rec #:  [id number]     Height:       71.0 in Accession #:    [id number]    Weight:       240.0 lb Date of Birth:  [birth year]    BSA:          2.278 m Patient Age:    33 years      BP:           131/109 mmHg Patient Gender: M             HR:           63 bpm. Exam Location:  Inpatient Procedure: 2D Echo Indications:    dyspnea 786.09  History:        Patient has prior history of Echocardiogram examinations, most                 recent 03/05/2019. Risk Factors:Hypertension and Current Smoker.                 Hx of Drug Use.  Sonographer:    Celene Skeen RDCS (AE) Referring Phys: [phone] LAURA A MURPHY IMPRESSIONS  1. Left ventricular ejection fraction, by estimation, is 20 to 25%. The left ventricle has severely decreased function. The left ventricle demonstrates global hypokinesis. There is mild concentric left ventricular hypertrophy. Indeterminate diastolic filling due to E-A fusion.  2. Right ventricular systolic function is moderately reduced. The right ventricular size is moderately enlarged. Tricuspid regurgitation signal is inadequate for assessing PA pressure.  3. The mitral valve is grossly normal. No evidence of mitral valve regurgitation. No evidence of mitral stenosis.  4. The aortic valve is tricuspid. Aortic valve regurgitation is not visualized. No aortic stenosis is present.  5. The  inferior vena cava is dilated in size with >50% respiratory variability, suggesting right atrial pressure of 8 mmHg. Comparison(s): Changes from prior study are noted. EF now 20-25%. FINDINGS  Left Ventricle: Left ventricular ejection fraction, by estimation, is 20 to 25%. The left ventricle has severely decreased function. The left ventricle demonstrates global hypokinesis. The left ventricular internal cavity size was normal in size. There is mild concentric left ventricular hypertrophy. Indeterminate  diastolic filling due to E-A fusion. Right Ventricle: The right ventricular size is moderately enlarged. No increase in right ventricular wall thickness. Right ventricular systolic function is moderately reduced. Tricuspid regurgitation signal is inadequate for assessing PA pressure. Left Atrium: Left atrial size was normal in size. Right Atrium: Right atrial size was normal in size. Pericardium: Trivial pericardial effusion is present. Mitral Valve: The mitral valve is grossly normal. No evidence of mitral valve regurgitation. No evidence of mitral valve stenosis. Tricuspid Valve: The tricuspid valve is grossly normal. Tricuspid valve regurgitation is trivial. No evidence of tricuspid stenosis. Aortic Valve: The aortic valve is tricuspid. Aortic valve regurgitation is not visualized. No aortic stenosis is present. Pulmonic Valve: The pulmonic valve was grossly normal. Pulmonic valve regurgitation is not visualized. No evidence of pulmonic stenosis. Aorta: The aortic root and ascending aorta are structurally normal, with no evidence of dilitation. Venous: The inferior vena cava is dilated in size with greater than 50% respiratory variability, suggesting right atrial pressure of 8 mmHg. IAS/Shunts: The atrial septum is grossly normal.  LEFT VENTRICLE PLAX 2D LVIDd:         5.60 cm LVIDs:         4.90 cm LV PW:         1.30 cm LV IVS:        1.20 cm LVOT diam:     2.60 cm LV SV:         44 LV SV Index:   19 LVOT Area:      5.31 cm  LEFT ATRIUM             Index LA diam:        3.90 cm 1.71 cm/m LA Vol (A2C):   67.3 ml 29.54 ml/m LA Vol (A4C):   48.1 ml 21.11 ml/m LA Biplane Vol: 57.1 ml 25.06 ml/m  AORTIC VALVE LVOT Vmax:   53.50 cm/s LVOT Vmean:  40.800 cm/s LVOT VTI:    0.082 m  AORTA Ao Root diam: 3.50 cm MITRAL VALVE MV Area (PHT): 2.69 cm     SHUNTS MV Decel Time: 282 msec     Systemic VTI:  0.08 m MV E velocity: 106.00 cm/s  Systemic Diam: 2.60 cm Lennie Odor MD Electronically signed by Lennie Odor MD Signature Date/Time: 02/16/2020/4:26:29 PM    Final     EKG: Independently reviewed. nonspecific ST and T waves changes, sinus tachycardia.  Assessment/Plan 1.  Acute on chronic systolic CHF. Presented with cough and shortness of breath.  Cardiology was consulted. Start the patient on IV Lasix. Patient is diuresing well. Currently not hypoxic but mildly tachycardic. Echocardiogram was performed urgently in the ER which shows EF of 20 to 25%. Unfortunately patient does not want to stay in the hospital as he has too take care of his kids. Informed the patient that this would still be AGAINST MEDICAL ADVICE. Given patient's volume overload we will provide him with low-dose Lasix. Due to patient not being on any medication prior to this presentation, at present not sure regarding how he would tolerate beta-blockers or ARB therefore unable to prescribe for now. Cardiology will provide the patient with urgent outpatient follow-up. Risk of leaving the hospital AMA were explained by EDP.  Primary team communication: Discussed with the EDP that the patient desires to leave AMA.  Thank you very much for involving [place] in care of your patient.   Author: Lynden Oxford, MD [facility] 02/16/2020 5:05 PM    If 7PM-7AM, please  contact night-coverage [email or web address]

## 2020-02-16 NOTE — ED Provider Notes (Cosign Needed)
[facility]   CSN: [id number] Arrival date & time: 02/16/20  0407     History Chief Complaint  Patient presents with  . Shortness of Breath  . Dental Pain    Donald Murray is a 34 y.o. male.  34 year old male presents with complaint of shortness of breath x 2-3 days, worse with exertion, states when he tries to sleep he wakes up gasping for air and has to cough a few times to clear his throat. Otherwise, no URI symptoms, denies chest pain, headache, visual disturbance, leg swelling. Patient has a history of high blood pressure, not currently taking anything, went to the dentist yesterday for a left lower toothache and facial swelling but was turned away due to elevated blood pressure. No other complaints or concerns.         Past Medical History:  Diagnosis Date  . Hypertension   . Obesity   . Snoring     Patient Active Problem List   Diagnosis Date Noted  . Cellulitis 03/04/2019  . IVDU (intravenous drug user) 03/04/2019    Past Surgical History:  Procedure Laterality Date  . I & D EXTREMITY Right 03/04/2019   Procedure: IRRIGATION AND DEBRIDEMENT OF RIGHT ELBOW;  Surgeon: Dominica Severin, MD;  Location: [facility] OR;  Service: Orthopedics;  Laterality: Right;  . IRRIGATION AND DEBRIDEMENT ABSCESS Left 03/04/2019   Procedure: Irrigation And Debridement Abscess Left hand;  Surgeon: Dominica Severin, MD;  Location: [facility] OR;  Service: Orthopedics;  Laterality: Left;  . UMBILICAL HERNIA REPAIR         Family History  Problem Relation Age of Onset  . Hypertension Mother   . Hypertension Father     Social History   Tobacco Use  . Smoking status: Current Every Day Smoker  . Smokeless tobacco: Never Used  Vaping Use  . Vaping Use: Every day  . Substances: Nicotine  Substance Use Topics  . 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 Drug use: Not Currently    Comment: Pt stated "It was opiates"     Home Medications Prior to Admission medications   Medication Sig Start Date End Date Taking? Authorizing Provider  ibuprofen (ADVIL) 200 MG tablet Take 200 mg by mouth every 6 (six) hours as needed for fever or moderate pain.   Yes [provider]    Allergies    Patient has no known allergies.  Review of Systems   Review of Systems  Constitutional: Negative for chills and fever.  HENT: Positive for dental problem and facial swelling. Negative for congestion, ear pain and sore throat.   Eyes: Negative for visual disturbance.  Respiratory: Positive for shortness of breath. Negative for cough.   Cardiovascular: Negative for chest pain and leg swelling.  Gastrointestinal: Negative for abdominal pain, nausea and vomiting.  Musculoskeletal: Negative for arthralgias and myalgias.  Skin: Negative for rash and wound.  Allergic/Immunologic: Negative for immunocompromised state.  Neurological: Negative for dizziness, weakness and headaches.  Psychiatric/Behavioral: Negative for confusion.  All other systems reviewed and are negative.   Physical Exam Updated Vital Signs BP 117/75   Pulse (!) 50   Temp 97.8 F (36.6 C) (Oral)   Resp 17   SpO2 96%   Physical Exam Vitals and nursing note reviewed.  Constitutional:      General: He is not in acute distress.    Appearance: He is well-developed. He is not diaphoretic.  HENT:     Head: Normocephalic and atraumatic.     Jaw: No trismus.     Comments: Minimal left cheek swelling with small dental cavity noted to left lower molar. No obvious abscess.     Mouth/Throat:     Mouth: Mucous membranes are moist.     Pharynx: No pharyngeal swelling.  Cardiovascular:     Rate and Rhythm: Normal rate and regular rhythm.     Pulses: Normal pulses.  Pulmonary:     Effort: Pulmonary effort is normal.     Breath sounds: No decreased breath sounds or wheezing.  Musculoskeletal:     Cervical back: Neck supple.     Right lower leg: No  edema.     Left lower leg: No edema.  Skin:    General: Skin is warm and dry.     Findings: No erythema or rash.  Neurological:     Mental Status: He is alert and oriented to person, place, and time.  Psychiatric:        Behavior: Behavior normal.     ED Results / Procedures / Treatments   Labs (all labs ordered are listed, but only abnormal results are displayed) Labs Reviewed  BASIC METABOLIC PANEL - Abnormal; Notable for the following components:      Result Value   Calcium 8.4 (*)    All other components within normal limits  URINALYSIS, ROUTINE W REFLEX MICROSCOPIC - Abnormal; Notable for the following components:   Specific Gravity, Urine 1.031 (*)    Hgb urine dipstick SMALL (*)    Protein, ur 30 (*)    Bacteria, UA RARE (*)    All other components within normal limits  BRAIN NATRIURETIC PEPTIDE - Abnormal; Notable for the following components:   B Natriuretic Peptide 409.0 (*)    All other components within normal limits  RESPIRATORY PANEL BY RT PCR (FLU A&B, COVID)  CBC WITH DIFFERENTIAL/PLATELET  RAPID URINE DRUG SCREEN, HOSP PERFORMED    EKG None  Radiology DG Chest 2 View  Result Date: 02/16/2020 CLINICAL DATA:  Shortness of breath EXAM: CHEST - 2 VIEW COMPARISON:  03/03/2019 FINDINGS: Normal for technique heart size. Diffuse interstitial opacity with Kerley lines and fissure thickening. Lung volumes are low. No effusion. No pneumothorax. IMPRESSION: CHF pattern. Electronically Signed   By: Marnee Spring M.D.   On: 02/16/2020 06:03    Procedures Procedures (including critical care time)  Medications Ordered in ED Medications  furosemide (LASIX) injection 20 mg (20 mg Intravenous Given 02/16/20 1225)    ED Course  I have reviewed the triage vital signs and the nursing notes.  Pertinent labs & imaging results that were available during my care of the patient were reviewed by me and considered in my medical decision making (see chart for  details).  Clinical Course as of Feb 16 1232  Fri Feb 16, 2020  381 34 year old male presents with complaint of SHOB, elevated blood pressure at the dentist and left lower dental pain. History of HTN, not currently taking medication, does not have a PCP. On exam, patient is obese, lungs clear, no lower extremity edema.  Labs reviewed, no significant finds on CBC, CMP. BNP elevated at 409. CXR suggests CHF. UA with protein. EKG without ischemic changes.    [LM]  0908 BP currently 109/83 without medications/interventions.    [LM]  1232 0915 new diagnosis CHF in this well appearing patient with BP improved without intervention. Discussed with Dr. Lynelle Doctor, ER attending,  recommends consult to cards master, consider OP follow up vs further work up today or as recommended by cardiology. Case discussed with cardiology APP who recommends echo, cardiology team to see patient.   [LM]    Clinical Course User Index [LM] Alden Hipp   MDM Rules/Calculators/A&P                          Final Clinical Impression(s) / ED Diagnoses Final diagnoses:  Acute congestive heart failure, unspecified heart failure type (HCC)  Pain, dental    Rx / DC Orders ED Discharge Orders    None       Jeannie Fend, PA-C 02/16/20 1447

## 2020-02-16 NOTE — ED Provider Notes (Signed)
Care assumed from PA Army Melia, please see her note for full details, but in brief Donald Murray is a 34 y.o. male who presents with shortness of breath and dental pain, found to be in new onset CHF.  Patient with a BNP of 409 and evidence of pulmonary edema on chest x-ray he is mildly tachycardic but he is not hypoxic at rest.  Per previous care team disposition pending cardiology evaluation and echo.  When I assumed care echo had been completed but has not yet been formally read, per cardiology team notes it appears that they are planning for inpatient admission.  I called and spoke with PA Micah Flesher with cardiology who states that they do not admit patients at [facility] long and they request medicine admission, she will follow up with the cards team on patient's echo reading.  At this time plan is for twice daily Lasix.  Consult placed for medicine admission.  Case discussed with Dr. Lynden Oxford with [facility] hospitalist who asks if patient would be a candidate for the hospital at home program rather than being admitted, will discuss this with the cardiology team, they did not initially recommend at this with their evaluation.  I again discussed the case with PA Micah Flesher who stated that given that the patient is new onset CHF and is Lasix nave and is also mildly tachycardic does not feel that he would be an ideal candidate for hospital at home program, they would like admission so that they can monitor his renal function and response to Lasix more closely and fine-tune needed medications for blood pressure management based on patient's echo.  Recommendations with Dr. Allena Katz with hospitalist who went to see the patient for admission, but came to inform me that patient states that he is unwilling to be admitted to the hospital, he states that he has childcare issues and is not able to remedy this until tomorrow and must go home tonight to be with his children.    Dr. Allena Katz will prescribe Lasix for the  patient and I have also sent in a prescription for penicillin for dental infection.    I notified cardiology team of this as well they state that his echo results just came back and he has an EF of 20 to 25%, I discussed these results with the patient as well and that this increases his risk of complications and potential death in the setting of severe new onset heart failure.  Discussed potential risks of leaving AGAINST MEDICAL ADVICE at length with patient and gave ample time to answer any and all questions.  Patient expresses understanding, but unfortunately still must leave AGAINST MEDICAL ADVICE due to childcare constraints.  Discussed returning to the hospital, and cardiology team will also work on getting him a close follow-up appointment.  Final diagnoses:  Acute congestive heart failure, unspecified heart failure type (HCC)  Pain, dental    ED Discharge Orders         Ordered    penicillin v potassium (VEETID) 500 MG tablet  4 times daily        02/16/20 1659    furosemide (LASIX) 20 MG tablet  Daily        02/16/20 1705            Dartha Lodge, [place] 02/16/20 1712    Linwood Dibbles, MD 02/19/20 [phone]

## 2020-02-16 NOTE — Discharge Instructions (Signed)
You were diagnosed with heart failure today and recommended for admission to the hospital for close monitoring and you were started on Lasix.  Since you are unable to stay for admission this medication has been prescribed to you and you should take it twice daily, and will cause you to urinate more frequently and help get fluid off of your lungs to help with your shortness of breath.   Your echo shows severe heart failure with an ejection fraction of 20 to 25%.    You have been prescribed penicillin for dental infection take as directed and follow-up closely with your dentist for further evaluation.  If you develop significant facial swelling, difficulty swallowing or other new or concerning symptoms return for reevaluation.

## 2020-02-16 NOTE — Progress Notes (Signed)
  Echocardiogram 2D Echocardiogram has been performed.  Donald Murray 02/16/2020, 3:04 PM

## 2020-02-16 NOTE — ED Notes (Signed)
ECHO at bedside.

## 2020-02-21 ENCOUNTER — Telehealth: Payer: Self-pay | Admitting: Cardiology

## 2020-02-21 NOTE — Telephone Encounter (Signed)
-----   Message from Freddi Starr, RN sent at 02/21/2020  9:18 AM EDT ----- Yes will be fine ----- Message ----- From: Lindell Spar, RN Sent: 02/21/2020   9:06 AM EDT To: Freddi Starr, RN  Hey - I saw you had a 7 day hold slot on 10/13. Would Dr. Jens Som be OK using that for this patient? ----- Message ----- From: Dossie Arbour, RN Sent: 02/19/2020  11:06 AM EDT To: Freddi Starr, RN, [facility] Triage  Stanton Kidney, See message below.  Looks like appointment is 10/25.  Just wanted to send to you to see if there was anything else available earlier. Thanks, Pat ----- Message ----- From: Marcelino Duster, PA Sent: 02/16/2020   5:04 PM EDT To: [facility] Triage  This patient has new EF 20-25% and left ER AMA. New patient to Dr. Jens Som. Can you please get him follow up ASAP?  Thanks Angie

## 2020-02-21 NOTE — Telephone Encounter (Signed)
Left message for patient to call back to schedule sooner ED follow up per Angie PA Per Deliah Goody RN - OK to use 7 day slot on 03/06/20 for sooner visit (instead of 03/18/20) Please schedule when patient returns call

## 2020-02-23 NOTE — Progress Notes (Signed)
HPI: Follow-up congestive heart failure.  Echocardiogram October 2020 showed normal LV function, mild left atrial enlargement. Patient seen in the emergency room February 16, 2020 with complaints of dyspnea on exertion, orthopnea and PND.  Initial blood pressure 174/128.  Noted to have red blood cells in his urine.  Chest x-ray showed CHF.  BNP 409.  Echocardiogram showed ejection fraction 20 to 25%, mild left ventricular hypertrophy.  Patient was to be admitted but he declined due to childcare issues.  Patient was given a prescription for Lasix.  He did sign out AGAINST MEDICAL ADVICE.  Since seen in the emergency room patient continues to have dyspnea with activities such as longer walks and climbing stairs.  He denies orthopnea or pedal edema.  Occasional PND.  No chest pain.  Current Outpatient Medications  Medication Sig Dispense Refill  . furosemide (LASIX) 20 MG tablet Take 1 tablet (20 mg total) by mouth daily. Take additional 20 mg daily for edema or weight gain of 3 lbs in 1 day or 5 lbs in 2 days. 30 tablet 0  . ibuprofen (ADVIL) 200 MG tablet Take 200 mg by mouth every 6 (six) hours as needed for fever or moderate pain.     No current facility-administered medications for this visit.     Past Medical History:  Diagnosis Date  . Hypertension   . Obesity   . Snoring     Past Surgical History:  Procedure Laterality Date  . I & D EXTREMITY Right 03/04/2019   Procedure: IRRIGATION AND DEBRIDEMENT OF RIGHT ELBOW;  Surgeon: Dominica Severin, MD;  Location: [facility] OR;  Service: Orthopedics;  Laterality: Right;  . IRRIGATION AND DEBRIDEMENT ABSCESS Left 03/04/2019   Procedure: Irrigation And Debridement Abscess Left hand;  Surgeon: Dominica Severin, MD;  Location: [facility] OR;  Service: Orthopedics;  Laterality: Left;  . UMBILICAL HERNIA REPAIR      Social History   Socioeconomic History  . Marital status: Single    Spouse name: Not on file  . Number of children: Not on file  .  Years of education: Not on file  . Highest education level: Not on file  Occupational History  . Not on file  Tobacco Use  . Smoking status: Current Every Day Smoker  . Smokeless tobacco: Never Used  Vaping Use  . Vaping Use: Every day  . Substances: Nicotine  Substance and Sexual Activity  . 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 Drug use: Not Currently    Comment: Pt stated "It was opiates"  . Sexual activity: Not on file  Other Topics Concern  . Not on file  Social History Narrative  . Not on file   Social Determinants of Health   Financial Resource Strain:   . Difficulty of Paying Living Expenses: Not on file  Food Insecurity:   . Worried About Programme researcher, broadcasting/film/video in the Last Year: Not on file  . Ran Out of Food in the Last Year: Not on file  Transportation Needs:   . Lack of Transportation (Medical): Not on file  . Lack of Transportation (Non-Medical): Not on file  Physical Activity:   . Days of Exercise per Week: Not on file  . Minutes of Exercise per Session: Not on file  Stress:   . Feeling of Stress : Not on file  Social Connections:   . Frequency of Communication with Friends and Family: Not on file  .  Frequency of Social Gatherings with Friends and Family: Not on file  . Attends Religious Services: Not on file  . Active Member of Clubs or Organizations: Not on file  . Attends Banker Meetings: Not on file  . Marital Status: Not on file  Intimate Partner Violence:   . Fear of Current or Ex-Partner: Not on file  . Emotionally Abused: Not on file  . Physically Abused: Not on file  . Sexually Abused: Not on file    Family History  Problem Relation Age of Onset  . Hypertension Mother   . Hypertension Father     ROS: no fevers or chills, productive cough, hemoptysis, dysphasia, odynophagia, melena, hematochezia, dysuria, hematuria, rash, seizure activity, orthopnea, PND, pedal edema, claudication. Remaining systems  are negative.  Physical Exam: Well-developed well-nourished in no acute distress.  Skin is warm and dry.  Tattoos noted HEENT is normal.  Neck is supple.  Chest is clear to auscultation with normal expansion.  Cardiovascular exam is regular rate and rhythm.  Abdominal exam nontender or distended. No masses palpated. Extremities show no edema. neuro grossly intact  Electrocardiogram today personally reviewed-sinus tachycardia, frequent PVCs.  A/P  1 chronic systolic congestive heart failure-patient not markedly volume overloaded on examination.  We will continue Lasix at present dose.  Add spironolactone 12.5 mg daily.  Check potassium and renal function today and in 1 week.  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  There is no history of recent alcohol use or viral syndrome.  Check TSH.  Add Entresto 24/26 twice daily and carvedilol 3.125 mg twice daily.  Will refer to CHF clinic to further titrate medications.  Would repeat echocardiogram 3 months after medications fully titrated.  If ejection fraction remains decreased would need to pursue ischemia evaluation such as cardiac CTA.  He is also noted to have frequent PVCs on electrocardiogram and will consider monitor in the future to quantify to see if ectopy is contributing to decreased LV function.  3 hypertension-blood pressure elevated.  We are adding Entresto, low-dose carvedilol and spironolactone as outlined.  Follow blood pressure and advance regimen as needed.  4 history of substance abuse-patient states he has been clean for 1 year.  5 microscopic hematuria-patient will need to arrange evaluation by primary care.  We discussed this today.  Olga Millers, MD

## 2020-03-06 ENCOUNTER — Ambulatory Visit (INDEPENDENT_AMBULATORY_CARE_PROVIDER_SITE_OTHER): Payer: Self-pay | Admitting: Cardiology

## 2020-03-06 ENCOUNTER — Encounter: Payer: Self-pay | Admitting: Cardiology

## 2020-03-06 ENCOUNTER — Other Ambulatory Visit: Payer: Self-pay

## 2020-03-06 VITALS — BP 148/92 | HR 71 | Ht 71.0 in | Wt 308.0 lb

## 2020-03-06 DIAGNOSIS — I5022 Chronic systolic (congestive) heart failure: Secondary | ICD-10-CM

## 2020-03-06 DIAGNOSIS — I420 Dilated cardiomyopathy: Secondary | ICD-10-CM

## 2020-03-06 DIAGNOSIS — I10 Essential (primary) hypertension: Secondary | ICD-10-CM

## 2020-03-06 MED ORDER — CARVEDILOL 3.125 MG PO TABS: 3.1250 mg | ORAL_TABLET | Freq: Two times a day (BID) | ORAL | 3 refills | Status: DC

## 2020-03-06 MED ORDER — SPIRONOLACTONE 25 MG PO TABS: 12.5000 mg | ORAL_TABLET | Freq: Every day | ORAL | 3 refills | Status: DC

## 2020-03-06 MED ORDER — ENTRESTO 24-26 MG PO TABS: 1.0000 | ORAL_TABLET | Freq: Two times a day (BID) | ORAL | 6 refills | Status: DC

## 2020-03-06 NOTE — Patient Instructions (Signed)
Medication Instructions:   START ENTRESTO 24/26 MG 1 TABLET TWICE DAILY  START CARVEDILOL 3.125 MG 1 TABLET TWICE DAILY  START SPIRONOLACTONE 12.5 MG ONCE DAILY= 1/2 OF THE 25 MG TABLET ONCE DAILY  *If you need a refill on your cardiac medications before your next appointment, please call your pharmacy*   Lab Work:  Your physician recommends that you HAVE LAB WORK TODAY  Your physician recommends that you return for lab work in: ONE WEEK  If you have labs (blood work) drawn today and your tests are completely normal, you will receive your results only by: Marland Kitchen MyChart Message (if you have MyChart) OR . A paper copy in the mail If you have any lab test that is abnormal or we need to change your treatment, we will call you to review the results.   Follow-Up: At [facility], you and your health needs are our priority.  As part of our continuing mission to provide you with exceptional heart care, we have created designated Provider Care Teams.  These Care Teams include your primary Cardiologist (physician) and Advanced Practice Providers (APPs -  Physician Assistants and Nurse Practitioners) who all work together to provide you with the care you need, when you need it.  We recommend signing up for the patient portal called "MyChart".  Sign up information is provided on this After Visit Summary.  MyChart is used to connect with patients for Virtual Visits (Telemedicine).  Patients are able to view lab/test results, encounter notes, upcoming appointments, etc.  Non-urgent messages can be sent to your provider as well.   To learn more about what you can do with MyChart, go to [email or web address].    Your next appointment:   2 week(s)  The format for your next appointment:   In Person  Provider:   You will see one of the following Advanced Practice Providers on your designated Care Team:    Corine Shelter, PA-C  [place], [place]  Edd Fabian, [place]  Then, Olga Millers, MD  will plan to see you again in 2 month(s).    Other Instructions REFERRAL TO THE [facility] FAILURE CLINIC NEXT AVAILABLE

## 2020-03-07 ENCOUNTER — Encounter: Payer: Self-pay | Admitting: *Deleted

## 2020-03-07 LAB — BASIC METABOLIC PANEL
BUN/Creatinine Ratio: 15 (ref 9–20)
BUN: 13 mg/dL (ref 6–20)
CO2: 19 mmol/L — ABNORMAL LOW (ref 20–29)
Calcium: 8.7 mg/dL (ref 8.7–10.2)
Chloride: 102 mmol/L (ref 96–106)
Creatinine, Ser: 0.87 mg/dL (ref 0.76–1.27)
GFR calc Af Amer: 131 mL/min/{1.73_m2} (ref >59)
GFR calc non Af Amer: 113 mL/min/{1.73_m2} (ref >59)
Glucose: 80 mg/dL (ref 65–99)
Potassium: 4.4 mmol/L (ref 3.5–5.2)
Sodium: 139 mmol/L (ref 134–144)

## 2020-03-07 LAB — TSH: TSH: 1.49 u[IU]/mL (ref 0.450–4.500)

## 2020-03-18 ENCOUNTER — Ambulatory Visit: Payer: Self-pay | Admitting: Cardiology

## 2020-03-20 ENCOUNTER — Other Ambulatory Visit: Payer: Self-pay

## 2020-03-20 ENCOUNTER — Encounter: Payer: Self-pay | Admitting: Cardiology

## 2020-03-20 ENCOUNTER — Ambulatory Visit (INDEPENDENT_AMBULATORY_CARE_PROVIDER_SITE_OTHER): Payer: Self-pay | Admitting: Cardiology

## 2020-03-20 DIAGNOSIS — I10 Essential (primary) hypertension: Secondary | ICD-10-CM

## 2020-03-20 DIAGNOSIS — I5022 Chronic systolic (congestive) heart failure: Secondary | ICD-10-CM

## 2020-03-20 DIAGNOSIS — I420 Dilated cardiomyopathy: Secondary | ICD-10-CM

## 2020-03-20 DIAGNOSIS — F1990 Other psychoactive substance use, unspecified, uncomplicated: Secondary | ICD-10-CM

## 2020-03-20 DIAGNOSIS — I5021 Acute systolic (congestive) heart failure: Secondary | ICD-10-CM

## 2020-03-20 MED ORDER — FUROSEMIDE 20 MG PO TABS: 20.0000 mg | ORAL_TABLET | Freq: Every day | ORAL | 6 refills | Status: DC

## 2020-03-20 MED ORDER — CARVEDILOL 6.25 MG PO TABS: ORAL_TABLET | ORAL | 3 refills | Status: DC

## 2020-03-20 NOTE — Assessment & Plan Note (Signed)
Admitted 02/16/2020 with acute CHF- new drop in EF Currently stable on medication- I increased Coreg to 6.25 mg in AM, 3.125 mg in PM

## 2020-03-20 NOTE — Assessment & Plan Note (Signed)
None in > 10 months

## 2020-03-20 NOTE — Progress Notes (Signed)
Cardiology Office Note:    Date:  03/20/2020   ID:  Donald Murray, DOB [birth year], MRN [id number]  PCP:  Patient, [name]  Cardiologist:  Olga Millers, MD  Electrophysiologist:  None   Referring MD: No ref. provider found   No chief complaint on file.   History of Present Illness:    Donald Murray is a 34 y.o. male with a hx of acute congestive heart failure 02/16/2020.  He has a history of hypertension.  Previous echocardiogram in 2028 showed normal LV function, echocardiogram done in September 2021 showed an ejection fraction of 20 to 25% with global hypokinesis.  Patient was seen as an outpatient by Dr. Jens Som.  He was placed on Entresto and Aldactone and carvedilol.  He was given a referral to the heart failure clinic.  This is pending.  He is seen in the office today by me for follow-up.  Since his last medication adjustment 03/06/2020 he denies any unusual shortness of breath.  He is not had dizziness or orthostatic symptoms.  Blood pressure by me in the office was 108/62.  His heart rate is 105 with a PVC.  The patient has been monitoring his diet and avoiding sodium.  Past Medical History:  Diagnosis Date  . Hypertension   . Obesity   . Snoring     Past Surgical History:  Procedure Laterality Date  . I & D EXTREMITY Right 03/04/2019   Procedure: IRRIGATION AND DEBRIDEMENT OF RIGHT ELBOW;  Surgeon: Dominica Severin, MD;  Location: [facility] OR;  Service: Orthopedics;  Laterality: Right;  . IRRIGATION AND DEBRIDEMENT ABSCESS Left 03/04/2019   Procedure: Irrigation And Debridement Abscess Left hand;  Surgeon: Dominica Severin, MD;  Location[facility] OR;  Service: Orthopedics;  Laterality: Left;  . UMBILICAL HERNIA REPAIR      Current Medications: Current Meds  Medication Sig  . carvedilol (COREG) 6.25 MG tablet Take 6.25 mg (1 tablet) in the morning and 3.125 mg (1/2 tablet) in the evening  . furosemide (LASIX) 20 MG tablet Take 1 tablet (20 mg total) by mouth daily. Take  additional 20 mg daily for edema or weight gain of 3 lbs in 1 day or 5 lbs in 2 days.  Marland Kitchen ibuprofen (ADVIL) 200 MG tablet Take 200 mg by mouth every 6 (six) hours as needed for fever or moderate pain.  . sacubitril-valsartan (ENTRESTO) 24-26 MG Take 1 tablet by mouth 2 (two) times daily.  Marland Kitchen spironolactone (ALDACTONE) 25 MG tablet Take 0.5 tablets (12.5 mg total) by mouth daily.  . [DISCONTINUED] carvedilol (COREG) 3.125 MG tablet Take 1 tablet (3.125 mg total) by mouth 2 (two) times daily.  . [DISCONTINUED] furosemide (LASIX) 20 MG tablet Take 1 tablet (20 mg total) by mouth daily. Take additional 20 mg daily for edema or weight gain of 3 lbs in 1 day or 5 lbs in 2 days.     Allergies:   Patient has no known allergies.   Social History   Socioeconomic History  . Marital status: Single    Spouse name: Not on file  . Number of children: Not on file  . Years of education: Not on file  . Highest education level: Not on file  Occupational History  . Not on file  Tobacco Use  . Smoking status: Current Every Day Smoker  . Smokeless tobacco: Never Used  Vaping Use  . Vaping Use: Every day  . Substances: Nicotine  Substance and Sexual Activity  . 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 Drug use: Not Currently    Comment: Pt stated "It was opiates"  . Sexual activity: Not on file  Other Topics Concern  . Not on file  Social History Narrative  . Not on file   Social Determinants of Health   Financial Resource Strain:   . Difficulty of Paying Living Expenses: Not on file  Food Insecurity:   . Worried About Programme researcher, broadcasting/film/video in the Last Year: Not on file  . Ran Out of Food in the Last Year: Not on file  Transportation Needs:   . Lack of Transportation (Medical): Not on file  . Lack of Transportation (Non-Medical): Not on file  Physical Activity:   . Days of Exercise per Week: Not on file  . Minutes of Exercise per Session: Not on file  Stress:   .  Feeling of Stress : Not on file  Social Connections:   . Frequency of Communication with Friends and Family: Not on file  . Frequency of Social Gatherings with Friends and Family: Not on file  . Attends Religious Services: Not on file  . Active Member of Clubs or Organizations: Not on file  . Attends Banker Meetings: Not on file  . Marital Status: Not on file     Family History: The patient's family history includes Hypertension in his father and mother.  ROS:   Please see the history of present illness. Past history of IVDU- not currently    All other systems reviewed and are negative.  EKGs/Labs/Other Studies Reviewed:    The following studies were reviewed today: Echo 02/16/2020- IMPRESSIONS    1. Left ventricular ejection fraction, by estimation, is 20 to 25%. The  left ventricle has severely decreased function. The left ventricle  demonstrates global hypokinesis. There is mild concentric left ventricular  hypertrophy. Indeterminate diastolic  filling due to E-A fusion.  2. Right ventricular systolic function is moderately reduced. The right  ventricular size is moderately enlarged. Tricuspid regurgitation signal is  inadequate for assessing PA pressure.  3. The mitral valve is grossly normal. No evidence of mitral valve  regurgitation. No evidence of mitral stenosis.  4. The aortic valve is tricuspid. Aortic valve regurgitation is not  visualized. No aortic stenosis is present.  5. The inferior vena cava is dilated in size with >50% respiratory  variability, suggesting right atrial pressure of 8 mmHg.   Comparison(s): Changes from prior study are noted. EF now 20-  EKG:  EKG is ordered today.  The ekg ordered today demonstrates NSR, ST, PVC  Recent Labs: 02/16/2020: B Natriuretic Peptide 409.0; Hemoglobin 14.5; Platelets 294 03/06/2020: BUN 13; Creatinine, Ser 0.87; Potassium 4.4; Sodium 139; TSH 1.490  Recent Lipid Panel No results found for:  CHOL, TRIG, HDL, CHOLHDL, VLDL, LDLCALC, LDLDIRECT  Physical Exam:    VS:  BP 132/90   Pulse (!) 109   Ht 5\' 11"  (1.803 m)   Wt (!) 312 lb 9.6 oz (141.8 kg)   BMI 43.60 kg/m     Wt Readings from Last 3 Encounters:  03/20/20 (!) 312 lb 9.6 oz (141.8 kg)  03/06/20 (!) 308 lb (139.7 kg)  03/03/19 240 lb (108.9 kg)     GEN: Overweight Caucasian male, well developed in no acute distress HEENT: Normal NECK: No JVD; No carotid bruitsy CARDIAC: RRR, no murmurs, rubs, gallops RESPIRATORY:  Clear to auscultation without rales, wheezing or rhonchi  ABDOMEN: Soft, non-tender, non-distended MUSCULOSKELETAL:  No  edema; No deformity  SKIN: Warm and dry NEUROLOGIC:  Alert and oriented x 3 PSYCHIATRIC:  Normal affect   ASSESSMENT:    Acute congestive heart failure (HCC) Admitted 02/16/2020 with acute CHF- new drop in EF Currently stable on medication- I increased Coreg to 6.25 mg in AM, 3.125 mg in PM  IVDU (intravenous drug user) None in > 10 months  Essential hypertension B/P borderline low by my check- 108/62.  I am not comfortable increasing Entresto.  His HR is > 100 with PVCs- increase Coreg dose.  PLAN:    Increase Coreg to 6.25 mg in am 3.125 mg in PM.  F/U BMP today on Aldactone and Entresto.  CHF clinic f/u arranged for December. I told him he could take Lasix PRN for weight gain of 3-5 lbs in a week.    Medication Adjustments/Labs and Tests Ordered: Current medicines are reviewed at length with the patient today.  Concerns regarding medicines are outlined above.  Orders Placed This Encounter  Procedures  . Basic Metabolic Panel (BMET)   Meds ordered this encounter  Medications  . furosemide (LASIX) 20 MG tablet    Sig: Take 1 tablet (20 mg total) by mouth daily. Take additional 20 mg daily for edema or weight gain of 3 lbs in 1 day or 5 lbs in 2 days.    Dispense:  30 tablet    Refill:  6  . carvedilol (COREG) 6.25 MG tablet    Sig: Take 6.25 mg (1 tablet) in the  morning and 3.125 mg (1/2 tablet) in the evening    Dispense:  135 tablet    Refill:  3    There are no Patient Instructions on file for this visit.   Jolene Provost, PA-C  03/20/2020 11:58 AM    [facility]

## 2020-03-20 NOTE — Assessment & Plan Note (Signed)
B/P borderline low by my check- 108/62.  I am not comfortable increasing Entresto.  His HR is > 100 with PVCs- increase Coreg dose.

## 2020-03-20 NOTE — Patient Instructions (Signed)
Medication Instructions:  Take Carvedilol 6.25 mg by mouth in the morning and 3.125 mg by in the evening  *If you need a refill on your cardiac medications before your next appointment, please call your pharmacy*   Lab Work: BMP Today   Testing/Procedures: None Ordered   Follow-Up: At [facility], you and your health needs are our priority.  As part of our continuing mission to provide you with exceptional heart care, we have created designated Provider Care Teams.  These Care Teams include your primary Cardiologist (physician) and Advanced Practice Providers (APPs -  Physician Assistants and Nurse Practitioners) who all work together to provide you with the care you need, when you need it.  We recommend signing up for the patient portal called "MyChart".  Sign up information is provided on this After Visit Summary.  MyChart is used to connect with patients for Virtual Visits (Telemedicine).  Patients are able to view lab/test results, encounter notes, upcoming appointments, etc.  Non-urgent messages can be sent to your provider as well.   To learn more about what you can do with MyChart, go to [email or web address].    Your next appointment:   Wednesday December 1st @ 11:40 am with Dr Gala Romney

## 2020-03-21 LAB — BASIC METABOLIC PANEL
BUN/Creatinine Ratio: 17 (ref 9–20)
BUN: 14 mg/dL (ref 6–20)
CO2: 23 mmol/L (ref 20–29)
Calcium: 9 mg/dL (ref 8.7–10.2)
Chloride: 106 mmol/L (ref 96–106)
Creatinine, Ser: 0.84 mg/dL (ref 0.76–1.27)
GFR calc Af Amer: 133 mL/min/{1.73_m2} (ref >59)
GFR calc non Af Amer: 115 mL/min/{1.73_m2} (ref >59)
Glucose: 84 mg/dL (ref 65–99)
Potassium: 4.4 mmol/L (ref 3.5–5.2)
Sodium: 141 mmol/L (ref 134–144)

## 2020-04-05 NOTE — Addendum Note (Signed)
Addended by: Brunetta Genera on: 04/05/2020 02:05 PM   Modules accepted: Orders

## 2020-04-20 NOTE — Progress Notes (Signed)
ADVANCED HF CLINIC CONSULT NOTE  Referring Physician:Patient, [name] Primary Care: Primary Cardiologist: Donald Millers, MD  HPI:  Donald Murray is a morbidly obese 34 y.o. male with HTN, IVDA (heroin), tobacco use and systolic HF (onset 9/21) referred by Dr. Jens Murray for further management of his HF.   Echo 10/20 EF 60-65%   Admitted to [facility] 9/21 with new onset HF in setting of severe HTN 174/128.Marland Kitchen ECG with sinus tach and frequent PVCs.   Echo 02/16/20: EF 20-25% Moderate RV dysfunction.   Has been started on carvedilol, Entresto and spiro. 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 Mild DOE with exertion. SOB with steps. Still struggling with orthopnea and PND. No CP. Tries to take meds as best as possible. Says he is about 70-80% compliant with meds around work schedule. + snoring. No DM2  No family h/o CM. 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    Review of Systems: [y] = yes, [ ]  = no   General: Weight gain [ ] ; Weight loss [ ] ; Anorexia [ ] ; Fatigue [ ] ; Fever [ ] ; Chills [ ] ; Weakness [ ]   Cardiac: Chest pain/pressure [ ] ; Resting SOB [ y]; Exertional SOB ]; Orthopnea [ y]; Pedal Edema [ ] ; Palpitations [ ] ; Syncope [ ] ; Presyncope [ ] ; Paroxysmal nocturnal dyspnea[ y]  Pulmonary: Cough [ ] ; Wheezing[ ] ; Hemoptysis[ ] ; Sputum [ ] ; Snoring ]  GI: Vomiting[ ] ; Dysphagia[ ] ; Melena[ ] ; Hematochezia [ ] ; Heartburn[ ] ; Abdominal pain [ ] ; Constipation [ ] ; Diarrhea [ ] ; BRBPR [ ]   GU: Hematuria[ ] ; Dysuria [ ] ; Nocturia[ ]   Vascular: Pain in legs with walking [ ] ; Pain in feet with lying flat [ ] ; Non-healing sores [ ] ; Stroke [ ] ; TIA [ ] ; Slurred speech [ ] ;  Neuro: Headaches[ ] ; Vertigo[ ] ; Seizures[ ] ; Paresthesias[ ] ;Blurred vision [ ] ; Diplopia [ ] ; Vision changes [ ]   Ortho/Skin: Arthritis [ ] ; Joint pain [ ] ; Muscle pain [ ] ; Joint swelling [ ] ; Back Pain [ ] ; Rash [ ]   Psych: Depression[ ] ; Anxiety[ ]   Heme: Bleeding problems [ ] ; Clotting disorders [ ] ;  Anemia [ ]   Endocrine: Diabetes [ ] ; Thyroid dysfunction[ ]    Past Medical History:  Diagnosis Date  . CHF (congestive heart failure) (HCC)   . Hypertension   . Obesity   . Snoring     Current Outpatient Medications  Medication Sig Dispense Refill  . carvedilol (COREG) 6.25 MG tablet Take 6.25 mg (1 tablet) in the morning and 3.125 mg (1/2 tablet) in the evening 135 tablet 3  . furosemide (LASIX) 20 MG tablet Take 1 tablet (20 mg total) by mouth daily. Take additional 20 mg daily for edema or weight gain of 3 lbs in 1 day or 5 lbs in 2 days. 30 tablet 6  . ibuprofen (ADVIL) 200 MG tablet Take 200 mg by mouth every 6 (six) hours as needed for fever or moderate pain.    . sacubitril-valsartan (ENTRESTO) 24-26 MG Take 1 tablet by mouth 2 (two) times daily. 60 tablet 6  . spironolactone (ALDACTONE) 25 MG tablet Take 0.5 tablets (12.5 mg total) by mouth daily. 45 tablet 3   No current facility-administered medications for this encounter.    No Known Allergies    Social History   Socioeconomic History  . Marital status: Single    Spouse name: Not on file  . Number of children: Not on file  .  Years of education: Not on file  . Highest education level: Not on file  Occupational History  . Not on file  Tobacco Use  . Smoking status: Current Every Day Smoker  . Smokeless tobacco: Never Used  Vaping Use  . Vaping Use: Every day  . Substances: Nicotine  Substance and Sexual Activity  . 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 Drug use: Not Currently    Comment: Pt stated "It was opiates"  . Sexual activity: Not on file  Other Topics Concern  . Not on file  Social History Narrative  . Not on file   Social Determinants of Health   Financial Resource Strain:   . Difficulty of Paying Living Expenses: Not on file  Food Insecurity:   . Worried About Programme researcher, broadcasting/film/video in the Last Year: Not on file  . Ran Out of Food in the Last Year: Not on file   Transportation Needs:   . Lack of Transportation (Medical): Not on file  . Lack of Transportation (Non-Medical): Not on file  Physical Activity:   . Days of Exercise per Week: Not on file  . Minutes of Exercise per Session: Not on file  Stress:   . Feeling of Stress : Not on file  Social Connections:   . Frequency of Communication with Friends and Family: Not on file  . Frequency of Social Gatherings with Friends and Family: Not on file  . Attends Religious Services: Not on file  . Active Member of Clubs or Organizations: Not on file  . Attends Banker Meetings: Not on file  . Marital Status: Not on file  Intimate Partner Violence:   . Fear of Current or Ex-Partner: Not on file  . Emotionally Abused: Not on file  . Physically Abused: Not on file  . Sexually Abused: Not on file      Family History  Problem Relation Age of Onset  . Hypertension Mother   . Hypertension Father     Vitals:   04/24/20 1136  BP: 130/90  Pulse: (!) 113  SpO2: 96%  Weight: (!) 147.5 kg (325 lb 3.2 oz)    PHYSICAL EXAM: General:  Well appearing. No respiratory difficulty HEENT: normal Neck: supple. JVP 8. Carotids 2+ bilat; no bruits. No lymphadenopathy or thryomegaly appreciated. Cor: PMI nondisplaced. Tachy regular +s3 Lungs: clear Abdomen: obese soft, nontender, nondistended. No hepatosplenomegaly. No bruits or masses. Good bowel sounds. Extremities: no cyanosis, clubbing, rash, 1+ edema Neuro: alert & oriented x 3, cranial nerves grossly intact. moves all 4 extremities w/o difficulty. Affect pleasant.  ECG: Sinus tach 108 with frequent monomorphic PVCs. Personally reviewed   ASSESSMENT & PLAN:  1. Chronic systolic HF - diagnosed 9/21 Echo EF 20-25% RV moderately HK - suspect HTN vs PVC-mediated - NYHA II - Volume status elevated on lasix 20mg  daily. ReDS 41% - Increase lasix to 40mg  daily - Continue carvedilol 6.25/3.125 - Increase Entresto 49/51 - Add digoxin 0.125  - Continue spiro 12.5 - Place zio to quantify PVC burden  2. HTN, severe - control improved. - Med changes as above  3. Frequent PVCs - place 14-day Zio to quantify - needs sleep study  4. IVDA (heroin) - reports no use since 12/20  5. Morbid obesity - needs weight loss and sleep study  12-19-2005, MD  12:27 PM

## 2020-04-24 ENCOUNTER — Ambulatory Visit (HOSPITAL_COMMUNITY)
Admission: RE | Admit: 2020-04-24 | Discharge: 2020-04-24 | Disposition: A | Discharge status: Home / Self Care | Payer: Self-pay | Source: Ambulatory Visit | Attending: Internal Medicine | Admitting: Internal Medicine

## 2020-04-24 ENCOUNTER — Encounter (HOSPITAL_COMMUNITY): Payer: Self-pay | Admitting: Internal Medicine

## 2020-04-24 ENCOUNTER — Other Ambulatory Visit: Payer: Self-pay

## 2020-04-24 ENCOUNTER — Other Ambulatory Visit (HOSPITAL_COMMUNITY): Payer: Self-pay | Admitting: Internal Medicine

## 2020-04-24 VITALS — BP 130/90 | HR 113 | Wt 325.2 lb

## 2020-04-24 DIAGNOSIS — F17200 Nicotine dependence, unspecified, uncomplicated: Secondary | ICD-10-CM | POA: Insufficient documentation

## 2020-04-24 DIAGNOSIS — I493 Ventricular premature depolarization: Secondary | ICD-10-CM

## 2020-04-24 DIAGNOSIS — I10 Essential (primary) hypertension: Secondary | ICD-10-CM

## 2020-04-24 DIAGNOSIS — Z79899 Other long term (current) drug therapy: Secondary | ICD-10-CM | POA: Insufficient documentation

## 2020-04-24 DIAGNOSIS — I110 Hypertensive heart disease with heart failure: Secondary | ICD-10-CM | POA: Insufficient documentation

## 2020-04-24 DIAGNOSIS — I5022 Chronic systolic (congestive) heart failure: Secondary | ICD-10-CM

## 2020-04-24 HISTORY — DX: Heart failure, unspecified: I50.9

## 2020-04-24 LAB — BASIC METABOLIC PANEL
Anion gap: 9 (ref 5–15)
BUN: 17 mg/dL (ref 6–20)
CO2: 23 mmol/L (ref 22–32)
Calcium: 8.9 mg/dL (ref 8.9–10.3)
Chloride: 109 mmol/L (ref 98–111)
Creatinine, Ser: 1.01 mg/dL (ref 0.61–1.24)
GFR, Estimated: >60 mL/min (ref >60)
Glucose, Bld: 95 mg/dL (ref 70–99)
Potassium: 4.6 mmol/L (ref 3.5–5.1)
Sodium: 141 mmol/L (ref 135–145)

## 2020-04-24 LAB — BRAIN NATRIURETIC PEPTIDE: B Natriuretic Peptide: 680.4 pg/mL — ABNORMAL HIGH (ref 0.0–100.0)

## 2020-04-24 LAB — MAGNESIUM: Magnesium: 2.2 mg/dL (ref 1.7–2.4)

## 2020-04-24 MED ORDER — ENTRESTO 49-51 MG PO TABS
1.0000 | ORAL_TABLET | Freq: Two times a day (BID) | ORAL | 3 refills | Status: DC
Start: 2020-04-24 — End: 2020-05-16

## 2020-04-24 MED ORDER — DIGOXIN 125 MCG PO TABS: 0.1250 mg | ORAL_TABLET | Freq: Every day | ORAL | 3 refills | Status: DC

## 2020-04-24 MED ORDER — FUROSEMIDE 20 MG PO TABS
40.0000 mg | ORAL_TABLET | Freq: Every day | ORAL | 6 refills | Status: DC
Start: 2020-04-24 — End: 2020-05-06

## 2020-04-24 MED FILL — FUROSEMIDE 40 MG TAB: 40 | 34 days supply | Qty: 100 | Fill #0

## 2020-04-24 MED FILL — CARVEDILOL 6.25 MG TABLET: 6.25 | 34 days supply | Qty: 68 | Fill #0

## 2020-04-24 MED FILL — DIGOXIN 0.125 MG TABLET: 125 | 34 days supply | Qty: 34 | Fill #0

## 2020-04-24 MED FILL — SPIRONOLACTONE 25 MG TABS: 25 | 34 days supply | Qty: 17 | Fill #0

## 2020-04-24 NOTE — Progress Notes (Signed)
Zio patch placed onto patient.  All instructions and information reviewed with patient, they verbalize understanding with no questions. 

## 2020-04-24 NOTE — Progress Notes (Signed)
HPI: Follow-up congestive heart failure.  Echocardiogram October 2020 showed normal LV function, mild left atrial enlargement. Patient seen in the emergency room February 16, 2020 with complaints of dyspnea on exertion, orthopnea and PND.  Initial blood pressure 174/128.  Noted to have red blood cells in his urine.  Chest x-ray showed CHF.  BNP 409.  Echocardiogram showed ejection fraction 20 to 25%, mild left ventricular hypertrophy.  Patient was to be admitted but he declined due to childcare issues.  Patient was given a prescription for Lasix.  He did sign out AGAINST MEDICAL ADVICE.    At last office visit we initiated therapy with Entresto, spironolactone and his carvedilol was recently increased.  Since seen he has some dyspnea on exertion but no orthopnea, pedal edema, chest pain or syncope.  Occasional PND.  Current Outpatient Medications  Medication Sig Dispense Refill  . carvedilol (COREG) 6.25 MG tablet Take 6.25 mg (1 tablet) in the morning and 3.125 mg (1/2 tablet) in the evening 135 tablet 3  . digoxin (LANOXIN) 0.125 MG tablet Take 1 tablet (0.125 mg total) by mouth daily. 30 tablet 3  . furosemide (LASIX) 20 MG tablet Take 2 tablets (40 mg total) by mouth daily. Take additional 20 mg daily for edema or weight gain of 3 lbs in 1 day or 5 lbs in 2 days. 60 tablet 6  . ibuprofen (ADVIL) 200 MG tablet Take 200 mg by mouth every 6 (six) hours as needed for fever or moderate pain.    . sacubitril-valsartan (ENTRESTO) 49-51 MG Take 1 tablet by mouth 2 (two) times daily. 60 tablet 3  . spironolactone (ALDACTONE) 25 MG tablet Take 0.5 tablets (12.5 mg total) by mouth daily. 45 tablet 3   No current facility-administered medications for this visit.     Past Medical History:  Diagnosis Date  . CHF (congestive heart failure) (HCC)   . Hypertension   . Obesity   . Snoring     Past Surgical History:  Procedure Laterality Date  . I & D EXTREMITY Right 03/04/2019   Procedure:  IRRIGATION AND DEBRIDEMENT OF RIGHT ELBOW;  Surgeon: Dominica Severin, MD;  Location: [facility] OR;  Service: Orthopedics;  Laterality: Right;  . IRRIGATION AND DEBRIDEMENT ABSCESS Left 03/04/2019   Procedure: Irrigation And Debridement Abscess Left hand;  Surgeon: Dominica Severin, MD;  Location: [facility] OR;  Service: Orthopedics;  Laterality: Left;  . UMBILICAL HERNIA REPAIR      Social History   Socioeconomic History  . Marital status: Single    Spouse name: Not on file  . Number of children: Not on file  . Years of education: Not on file  . Highest education level: Not on file  Occupational History  . Not on file  Tobacco Use  . Smoking status: Current Every Day Smoker  . Smokeless tobacco: Never Used  Vaping Use  . Vaping Use: Every day  . Substances: Nicotine  Substance and Sexual Activity  . 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 Drug use: Not Currently    Comment: Pt stated "It was opiates"  . Sexual activity: Not on file  Other Topics Concern  . Not on file  Social History Narrative  . Not on file   Social Determinants of Health   Financial Resource Strain: Not on file  Food Insecurity: Not on file  Transportation Needs: Not on file  Physical Activity: Not on file  Stress: Not  on file  Social Connections: Not on file  Intimate Partner Violence: Not on file    Family History  Problem Relation Age of Onset  . Hypertension Mother   . Hypertension Father     ROS: no fevers or chills, productive cough, hemoptysis, dysphasia, odynophagia, melena, hematochezia, dysuria, hematuria, rash, seizure activity, orthopnea, PND, pedal edema, claudication. Remaining systems are negative.  Physical Exam: Well-developed obese in no acute distress.  Skin is warm and dry.  HEENT is normal.  Neck is supple.  Chest is clear to auscultation with normal expansion.  Cardiovascular exam is regular rate and rhythm.  Abdominal exam nontender or distended. No  masses palpated. Extremities show no edema. neuro grossly intact  A/P  1 chronic systolic congestive heart failure-patient improved but continues with some dyspnea on exertion and occasional PND.  Increase Lasix to 60 mg daily.  Continue spironolactone.  In 1 week check potassium and renal function.  2 cardiomyopathy-this was felt likely hypertensive mediated.  Previous TSH normal.  No recent alcohol use.  Continue Entresto, carvedilol and digoxin.  We will plan repeat echocardiogram in 3 months.  If ejection fraction decreased will plan cardiac CTA to exclude coronary disease.  Note he has a monitor pending to rule out ectopy as a contributor to his reduced LV function.  3 hypertension-blood pressure much improved.  Continue present medications and follow.  4 history of substance abuse-patient denies using for > 1 year.  Olga Millers, MD

## 2020-04-24 NOTE — Patient Instructions (Addendum)
INCREASE Lasix 40mg  (2 tablets) daily  INCREASE Entresto 49/51mg  (1 tablet) Twice daily  START Digoxin 0.125mg  (1 tablet) Daily  Labs done today, your results will be available in MyChart, we will contact you for abnormal readings.  Your provider has recommended that  you wear a Zio Patch for 7 days.  This monitor will record your heart rhythm for our review.  IF you have any symptoms while wearing the monitor please press the button.  If you have any issues with the patch or you notice a red or orange light on it please call the company at [phone].  Once you remove the patch please mail it back to the company as soon as possible so we can get the results.  Your physician recommends that you schedule a follow-up appointment in: 2-3 weeks  If you have any questions or concerns before your next appointment please send [phone] a message through [place] or call our office at [phone].    TO LEAVE A MESSAGE FOR THE NURSE SELECT OPTION 2, PLEASE LEAVE A MESSAGE INCLUDING: . YOUR NAME . DATE OF BIRTH . CALL BACK NUMBER . REASON FOR CALL**this is important as we prioritize the call backs  YOU WILL RECEIVE A CALL BACK THE SAME DAY AS LONG AS YOU CALL BEFORE 4:00 PM

## 2020-04-24 NOTE — Progress Notes (Signed)
CSW consulted to meet with pt regarding current lack of insurance.  Pt states its been years since he's had insurance- had applied for Medicaid awhile ago but was denied.  States he works full time and thinks he can get insurance through his job but hasn't heard anything specific- CSW encouraged pt to speak with his manager this evening when he works again to inquire about Museum/gallery curator.  CSW provided pt [employer] information packet to apply for marketplace insurance if he is not eligible for insurance at work.  CSW also provided with CAFA application to help with past cone bills- has not been filing taxes so CSW had him fill out 4506-T and faxed to the IRS- will likely take 1-2 months for pt to receive in the mail so this will limit when we can apply.  CSW inquired if pt can afford medications currently- mentioned that he is on entresto and is worried about paying for this.  Was provided samples at first visit with other cardiology office and then a 30 day free card but has no way to get it next month and will be $700.  CSW filled out [employer] application for assistance and faxed to [employer] for review- confirmation received  Pt also expresses struggling to pay for other medications- states one of his meds was $120- CSW discussed transferring pt meds to Outpatient pharmacy under Heart Failure Fund- pt agreeable- asked clinic staff to complete  Will continue to follow and assist as needed  Burna Sis, LCSW Clinical Social Worker Advanced Heart Failure Clinic Desk#: [phone] Cell#: [phone]

## 2020-04-24 NOTE — Progress Notes (Signed)
HF Fund form filled out and faxed to [phone]

## 2020-04-24 NOTE — Progress Notes (Signed)
ReDS Vest / Clip - 04/24/20 1200      ReDS Vest / Clip   Station Marker D    Ruler Value 45    ReDS Value Range High volume overload    ReDS Actual Value 41

## 2020-05-06 ENCOUNTER — Encounter: Payer: Self-pay | Admitting: Cardiology

## 2020-05-06 ENCOUNTER — Other Ambulatory Visit: Payer: Self-pay

## 2020-05-06 ENCOUNTER — Ambulatory Visit (INDEPENDENT_AMBULATORY_CARE_PROVIDER_SITE_OTHER): Payer: Self-pay | Admitting: Cardiology

## 2020-05-06 VITALS — BP 108/84 | HR 78 | Ht 71.0 in | Wt 322.0 lb

## 2020-05-06 DIAGNOSIS — I5022 Chronic systolic (congestive) heart failure: Secondary | ICD-10-CM

## 2020-05-06 DIAGNOSIS — I10 Essential (primary) hypertension: Secondary | ICD-10-CM

## 2020-05-06 DIAGNOSIS — I420 Dilated cardiomyopathy: Secondary | ICD-10-CM

## 2020-05-06 DIAGNOSIS — I493 Ventricular premature depolarization: Secondary | ICD-10-CM

## 2020-05-06 MED ORDER — FUROSEMIDE 20 MG PO TABS: 60.0000 mg | ORAL_TABLET | Freq: Every day | ORAL | 3 refills | Status: DC

## 2020-05-06 NOTE — Patient Instructions (Signed)
Medication Instructions:   INCREASE FUROSEMIDE TO 60 MG ONCE DAILY= 3 OF THE 20 MG TABLETS ONCE DAILY  *If you need a refill on your cardiac medications before your next appointment, please call your pharmacy*   Lab Work:  Your physician recommends that you return for lab work in: ONE WEEK  If you have labs (blood work) drawn today and your tests are completely normal, you will receive your results only by: Marland Kitchen MyChart Message (if you have MyChart) OR . A paper copy in the mail If you have any lab test that is abnormal or we need to change your treatment, we will call you to review the results.   Testing/Procedures:  Your physician has requested that you have an echocardiogram. Echocardiography is a painless test that uses sound waves to create images of your heart. It provides your doctor with information about the size and shape of your heart and how well your heart's chambers and valves are working. This procedure takes approximately one hour. There are no restrictions for this procedure.[address]=SCHEDULE IN 3 MONTHS     Follow-Up: At [facility], you and your health needs are our priority.  As part of our continuing mission to provide you with exceptional heart care, we have created designated Provider Care Teams.  These Care Teams include your primary Cardiologist (physician) and Advanced Practice Providers (APPs -  Physician Assistants and Nurse Practitioners) who all work together to provide you with the care you need, when you need it.  We recommend signing up for the patient portal called "MyChart".  Sign up information is provided on this After Visit Summary.  MyChart is used to connect with patients for Virtual Visits (Telemedicine).  Patients are able to view lab/test results, encounter notes, upcoming appointments, etc.  Non-urgent messages can be sent to your provider as well.   To learn more about what you can do with MyChart, go to [email or web address].     Your next appointment:   3 month(s) AFTER ECHO COMPLETE  The format for your next appointment:   In Person  Provider:   Olga Millers, MD

## 2020-05-16 ENCOUNTER — Encounter (HOSPITAL_COMMUNITY): Payer: Self-pay | Admitting: Internal Medicine

## 2020-05-16 ENCOUNTER — Other Ambulatory Visit: Payer: Self-pay

## 2020-05-16 ENCOUNTER — Ambulatory Visit (HOSPITAL_COMMUNITY)
Admission: RE | Admit: 2020-05-16 | Discharge: 2020-05-16 | Disposition: A | Discharge status: Home / Self Care | Payer: Self-pay | Source: Ambulatory Visit | Attending: Internal Medicine | Admitting: Internal Medicine

## 2020-05-16 VITALS — BP 138/88 | HR 87 | Wt 325.2 lb

## 2020-05-16 DIAGNOSIS — F17200 Nicotine dependence, unspecified, uncomplicated: Secondary | ICD-10-CM | POA: Insufficient documentation

## 2020-05-16 DIAGNOSIS — R0602 Shortness of breath: Secondary | ICD-10-CM | POA: Insufficient documentation

## 2020-05-16 DIAGNOSIS — I10 Essential (primary) hypertension: Secondary | ICD-10-CM

## 2020-05-16 DIAGNOSIS — F1110 Opioid abuse, uncomplicated: Secondary | ICD-10-CM | POA: Insufficient documentation

## 2020-05-16 DIAGNOSIS — Z79899 Other long term (current) drug therapy: Secondary | ICD-10-CM | POA: Insufficient documentation

## 2020-05-16 DIAGNOSIS — I420 Dilated cardiomyopathy: Secondary | ICD-10-CM

## 2020-05-16 DIAGNOSIS — Z7901 Long term (current) use of anticoagulants: Secondary | ICD-10-CM | POA: Insufficient documentation

## 2020-05-16 DIAGNOSIS — I493 Ventricular premature depolarization: Secondary | ICD-10-CM

## 2020-05-16 DIAGNOSIS — I5022 Chronic systolic (congestive) heart failure: Secondary | ICD-10-CM

## 2020-05-16 DIAGNOSIS — I110 Hypertensive heart disease with heart failure: Secondary | ICD-10-CM | POA: Insufficient documentation

## 2020-05-16 LAB — BRAIN NATRIURETIC PEPTIDE: B Natriuretic Peptide: 258.6 pg/mL — ABNORMAL HIGH (ref 0.0–100.0)

## 2020-05-16 LAB — BASIC METABOLIC PANEL
Anion gap: 10 (ref 5–15)
BUN: 14 mg/dL (ref 6–20)
CO2: 22 mmol/L (ref 22–32)
Calcium: 9.2 mg/dL (ref 8.9–10.3)
Chloride: 108 mmol/L (ref 98–111)
Creatinine, Ser: 0.94 mg/dL (ref 0.61–1.24)
GFR, Estimated: >60 mL/min (ref >60)
Glucose, Bld: 92 mg/dL (ref 70–99)
Potassium: 4.3 mmol/L (ref 3.5–5.1)
Sodium: 140 mmol/L (ref 135–145)

## 2020-05-16 MED ORDER — ENTRESTO 97-103 MG PO TABS
1.0000 | ORAL_TABLET | Freq: Two times a day (BID) | ORAL | 3 refills | Status: DC
Start: 2020-05-16 — End: 2021-01-21

## 2020-05-16 MED ORDER — CARVEDILOL 6.25 MG PO TABS: 6.2500 mg | ORAL_TABLET | Freq: Two times a day (BID) | ORAL | 3 refills | Status: DC

## 2020-05-16 NOTE — Addendum Note (Signed)
Encounter addended by: Samara Snide, RN on: 05/16/2020 11:07 AM  Actions taken: Diagnosis association updated, Pharmacy for encounter modified, Order list changed, Charge Capture section accepted, Clinical Note Signed

## 2020-05-16 NOTE — Patient Instructions (Addendum)
INCREASE Entresto 97/103mg  (1 tablet) twice daily  INCREASE Carvedilol 6.25mg  (1 tablet) Twice Daily  Labs done today, your results will be available in MyChart, we will contact you for abnormal readings.  Your physician recommends that you schedule a follow-up appointment in: 2 months with an echocardiogram  Your physician has requested that you have an echocardiogram. Echocardiography is a painless test that uses sound waves to create images of your heart. It provides your doctor with information about the size and shape of your heart and how well your heart's chambers and valves are working. This procedure takes approximately one hour. There are no restrictions for this procedure.  If you have any questions or concerns before your next appointment please send [place] a message through [place] or call our office at [phone].    TO LEAVE A MESSAGE FOR THE NURSE SELECT OPTION 2, PLEASE LEAVE A MESSAGE INCLUDING: . YOUR NAME . DATE OF BIRTH . CALL BACK NUMBER . REASON FOR CALL**this is important as we prioritize the call backs  YOU WILL RECEIVE A CALL BACK THE SAME DAY AS LONG AS YOU CALL BEFORE 4:00 PM

## 2020-05-16 NOTE — Progress Notes (Signed)
ADVANCED HF CLINIC NOTE  Primary Care: Patient, [name] Primary Cardiologist: Olga Millers, MD  HPI:  Donald Murray is a morbidly obese 34 y.o. male with HTN, IVDA (heroin), tobacco use and systolic HF (onset 9/21) referred by Dr. Jens Som for further management of his HF.   Echo 10/20 EF 60-65%   Admitted to [facility] 9/21 with new onset HF in setting of severe HTN 174/128.Marland Kitchen ECG with sinus tach and frequent PVCs.   Echo 02/16/20: EF 20-25% Moderate RV dysfunction.   I saw him in HF Consultation for the first time 04/24/20. 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 Lasix and Entresto increased. Digoxin added. Zio placed to quantify PVC burden  Says he is doing ok. Feels a bit better since last visit. 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 Mild SOB with working. No edema. Orthopnea has resolved has been able to sleep in his bed instead of sitting up on the couch. No dizziness. Taking meds routinely.   Zio 12/21 Sinus - average HR 104 4.3% PVCs    Past Medical History:  Diagnosis Date  . CHF (congestive heart failure) (HCC)   . Hypertension   . Obesity   . Snoring     Current Outpatient Medications  Medication Sig Dispense Refill  . carvedilol (COREG) 6.25 MG tablet Take 6.25 mg (1 tablet) in the morning and 3.125 mg (1/2 tablet) in the evening 135 tablet 3  . digoxin (LANOXIN) 0.125 MG tablet Take 1 tablet (0.125 mg total) by mouth daily. 30 tablet 3  . furosemide (LASIX) 20 MG tablet Take 3 tablets (60 mg total) by mouth daily. Take additional 20 mg daily for edema or weight gain of 3 lbs in 1 day or 5 lbs in 2 days. 270 tablet 3  . ibuprofen (ADVIL) 200 MG tablet Take 200 mg by mouth every 6 (six) hours as needed for fever or moderate pain.    . sacubitril-valsartan (ENTRESTO) 49-51 MG Take 1 tablet by mouth 2 (two) times daily. 60 tablet 3  . spironolactone (ALDACTONE) 25 MG tablet Take 0.5 tablets (12.5 mg total) by mouth  daily. 45 tablet 3   No current facility-administered medications for this encounter.    No Known Allergies    Social History   Socioeconomic History  . Marital status: Single    Spouse name: Not on file  . Number of children: Not on file  . Years of education: Not on file  . Highest education level: Not on file  Occupational History  . Not on file  Tobacco Use  . Smoking status: Current Every Day Smoker  . Smokeless tobacco: Never Used  Vaping Use  . Vaping Use: Every day  . Substances: Nicotine  Substance and Sexual Activity  . 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 Drug use: Not Currently    Comment: Pt stated "It was opiates"  . Sexual activity: Not on file  Other Topics Concern  . Not on file  Social History Narrative  . Not on file   Social Determinants of Health   Financial Resource Strain: Not on file  Food Insecurity: Not on file  Transportation Needs: Not on file  Physical Activity: Not on file  Stress: Not on file  Social Connections: Not on file  Intimate Partner Violence: Not on file      Family History  Problem Relation Age of Onset  .  Hypertension Mother   . Hypertension Father     Vitals:   05/16/20 1046  BP: 138/88  Pulse: 87  SpO2: 97%  Weight: (!) 147.5 kg (325 lb 3.2 oz)    PHYSICAL EXAM: General:  Well appearing. No resp difficulty HEENT: normal Neck: supple. no JVD. Carotids 2+ bilat; no bruits. No lymphadenopathy or thryomegaly appreciated. Cor: PMI nondisplaced. Regular rate & rhythm. No rubs, gallops or murmurs. Lungs: clear Abdomen: obese soft, nontender, nondistended. No hepatosplenomegaly. No bruits or masses. Good bowel sounds. Extremities: no cyanosis, clubbing, rash, edema Neuro: alert & orientedx3, cranial nerves grossly intact. moves all 4 extremities w/o difficulty. Affect pleasant   ASSESSMENT & PLAN:  1. Chronic systolic HF - diagnosed 9/21 Echo EF 20-25% RV moderately HK -  suspect HTN vs PVC-mediated (PVC burden 4.3% - probably not high enough to cause CM) - NYHA II - Volume status improved. Continue lasix 40 daily - Continue carvedilol 6.25/3.125 - Continue Entresto 49/51 - Continue digoxin 0.125 - Continue spiro 12.5 - Add Farxiga at next visit - See back in 2 months with repeat echo. If Ef not improving will have to consider cath  2. HTN, severe - improving, changes as above   3. Frequent PVCs - Zio 12/21 4.3% PVCs - probably not high enough burden to cause CM  - needs sleep study - does not have insurance to cover  4. IVDA (heroin) - reports no use since 12/20  5. Morbid obesity - needs weight loss and sleep study  Arvilla Meres, MD  9:34 AM

## 2020-05-22 ENCOUNTER — Other Ambulatory Visit (HOSPITAL_COMMUNITY): Payer: Self-pay

## 2020-05-22 DIAGNOSIS — I5022 Chronic systolic (congestive) heart failure: Secondary | ICD-10-CM

## 2020-05-22 DIAGNOSIS — I10 Essential (primary) hypertension: Secondary | ICD-10-CM

## 2020-05-22 DIAGNOSIS — I420 Dilated cardiomyopathy: Secondary | ICD-10-CM

## 2020-05-22 MED ORDER — CARVEDILOL 6.25 MG PO TABS: 6.2500 mg | ORAL_TABLET | Freq: Two times a day (BID) | ORAL | 3 refills | Status: DC

## 2020-05-23 ENCOUNTER — Other Ambulatory Visit (HOSPITAL_COMMUNITY): Payer: Self-pay | Admitting: Internal Medicine

## 2020-05-23 ENCOUNTER — Other Ambulatory Visit (HOSPITAL_COMMUNITY): Payer: Self-pay

## 2020-05-23 DIAGNOSIS — I420 Dilated cardiomyopathy: Secondary | ICD-10-CM

## 2020-05-23 DIAGNOSIS — I5022 Chronic systolic (congestive) heart failure: Secondary | ICD-10-CM

## 2020-05-23 DIAGNOSIS — I10 Essential (primary) hypertension: Secondary | ICD-10-CM

## 2020-05-23 MED ORDER — CARVEDILOL 6.25 MG PO TABS: 6.2500 mg | ORAL_TABLET | Freq: Two times a day (BID) | ORAL | 3 refills | Status: DC

## 2020-05-23 MED FILL — CARVEDILOL 6.25 MG TABLET: 6.25 | 30 days supply | Qty: 60 | Fill #0

## 2020-07-05 MED FILL — SPIRONOLACTONE 25 MG TABS: 25 | 34 days supply | Qty: 17 | Fill #1

## 2020-07-05 MED FILL — DIGOXIN 0.125 MG TABLET: 125 | 34 days supply | Qty: 34 | Fill #1

## 2020-07-05 MED FILL — CARVEDILOL 6.25 MG TABLET: 6.25 | 30 days supply | Qty: 60 | Fill #0

## 2020-07-05 MED FILL — FUROSEMIDE 40 MG TAB: 40 | 34 days supply | Qty: 100 | Fill #1

## 2020-07-17 NOTE — Progress Notes (Signed)
Patient did not show for appt. Note left for templating purposes only        ADVANCED HF CLINIC NOTE  Primary Care: Patient, [name] Primary Cardiologist: Olga Millers, MD  HPI:  Donald Murray is a morbidly obese 35 y.o. male with HTN, IVDA (heroin), tobacco use and systolic HF (onset 9/21) referred by Dr. Jens Som for further management of his HF.   Echo 10/20 EF 60-65%   Admitted to [facility] 9/21 with new onset HF in setting of severe HTN 174/128.Marland Kitchen ECG with sinus tach and frequent PVCs.   Echo 02/16/20: EF 20-25% Moderate RV dysfunction.   I saw him in HF Consultation for the first time 04/24/20. 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 Lasix and Entresto increased. Digoxin added. Zio placed to quantify PVC burden - 4.3%.  Says he is doing ok. Feels a bit better since last visit. 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 Mild SOB with working. No edema. Orthopnea has resolved has been able to sleep in his bed instead of sitting up on the couch. No dizziness. Taking meds routinely.   Zio 12/21 Sinus - average HR 104 4.3% PVCs    Past Medical History:  Diagnosis Date   CHF (congestive heart failure) (HCC)    Hypertension    Obesity    Snoring     Current Outpatient Medications  Medication Sig Dispense Refill   carvedilol (COREG) 6.25 MG tablet Take 1 tablet (6.25 mg total) by mouth 2 (two) times daily with a meal. 180 tablet 3   digoxin (LANOXIN) 0.125 MG tablet Take 1 tablet (0.125 mg total) by mouth daily. 30 tablet 3   furosemide (LASIX) 20 MG tablet Take 3 tablets (60 mg total) by mouth daily. Take additional 20 mg daily for edema or weight gain of 3 lbs in 1 day or 5 lbs in 2 days. 270 tablet 3   ibuprofen (ADVIL) 200 MG tablet Take 200 mg by mouth every 6 (six) hours as needed for fever or moderate pain.     sacubitril-valsartan (ENTRESTO) 97-103 MG Take 1 tablet by mouth 2 (two) times daily. 60 tablet 3    spironolactone (ALDACTONE) 25 MG tablet Take 0.5 tablets (12.5 mg total) by mouth daily. 45 tablet 3   No current facility-administered medications for this encounter.    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  PHYSICAL EXAM: General:  Well appearing. No resp difficulty HEENT: normal Neck: supple. no JVD. Carotids 2+ bilat; no bruits. No lymphadenopathy or thryomegaly appreciated. Cor: PMI nondisplaced. Regular rate & rhythm. No rubs, gallops or murmurs. Lungs: clear Abdomen: obese soft, nontender, nondistended. No hepatosplenomegaly. No bruits or masses. Good bowel sounds. Extremities: no cyanosis, clubbing, rash, edema Neuro: alert & orientedx3, cranial nerves grossly intact. moves all 4 extremities w/o difficulty. Affect pleasant   ASSESSMENT & PLAN:  1. Chronic systolic HF - diagnosed 9/21 Echo EF 20-25% RV moderately  HK - suspect HTN vs PVC-mediated (PVC burden 4.3% - probably not high enough to cause CM) - NYHA II - Volume status improved. Continue lasix 40 daily - Continue carvedilol 6.25/3.125 - Continue Entresto 49/51 - Continue digoxin 0.125 - Continue spiro 12.5 - Add Farxiga at next visit - See back in 2 months with repeat echo. If Ef not improving will have to consider cath  2. HTN, severe - improving, changes as above   3. Frequent PVCs - Zio 12/21 4.3% PVCs - probably not high enough burden to cause CM  - needs sleep study - does not have insurance to cover  4. IVDA (heroin) - reports no use since 12/20  5. Morbid obesity - needs weight loss and sleep study  Arvilla Meres, MD  10:26 PM

## 2020-07-18 ENCOUNTER — Ambulatory Visit (HOSPITAL_COMMUNITY): Admission: RE | Admit: 2020-07-18 | Payer: Self-pay | Source: Ambulatory Visit

## 2020-07-18 ENCOUNTER — Inpatient Hospital Stay (HOSPITAL_COMMUNITY)
Admission: RE | Admit: 2020-07-18 | Discharge: 2020-07-18 | Disposition: A | Discharge status: Home / Self Care | Payer: Self-pay | Source: Ambulatory Visit | Attending: Internal Medicine | Admitting: Internal Medicine

## 2020-07-18 DIAGNOSIS — I5022 Chronic systolic (congestive) heart failure: Secondary | ICD-10-CM

## 2020-08-05 ENCOUNTER — Encounter (HOSPITAL_COMMUNITY): Payer: Self-pay | Admitting: Internal Medicine

## 2020-08-05 ENCOUNTER — Encounter (HOSPITAL_COMMUNITY): Payer: Self-pay | Admitting: Cardiology

## 2020-08-05 ENCOUNTER — Other Ambulatory Visit (HOSPITAL_COMMUNITY): Payer: Self-pay

## 2020-08-05 NOTE — Progress Notes (Unsigned)
Patient ID: Donald Murray, male   DOB: [birth year], 35 y.o.   MRN: [id number]  Verified appointment "no show" status with Helmut Muster at 9:43am.

## 2020-08-13 NOTE — Progress Notes (Deleted)
HPI: Follow-up congestive heart failure. Echocardiogram October 2020 showed normal LV function, mild left atrial enlargement. Patient seen in the emergency room February 16, 2020 with complaints of dyspnea on exertion, orthopnea and PND. Initial blood pressure 174/128. Noted to have red blood cells in his urine. Chest x-ray showed CHF. BNP 409. Echocardiogram showed ejection fraction 20 to 25%, mild left ventricular hypertrophy. Patient was to be admitted but he declined due to childcare issues. Patient was given a prescription for Lasix. He did sign out AGAINST MEDICAL ADVICE.   Cardiac medications added as an outpatient.  Monitor December 2021 with brief episodes of PAT, Mobitz 1 second-degree AV block and PVCs.  Since seen   Current Outpatient Medications  Medication Sig Dispense Refill  . carvedilol (COREG) 6.25 MG tablet Take 1 tablet (6.25 mg total) by mouth 2 (two) times daily with a meal. 180 tablet 3  . digoxin (LANOXIN) 0.125 MG tablet Take 1 tablet (0.125 mg total) by mouth daily. 30 tablet 3  . furosemide (LASIX) 20 MG tablet Take 3 tablets (60 mg total) by mouth daily. Take additional 20 mg daily for edema or weight gain of 3 lbs in 1 day or 5 lbs in 2 days. 270 tablet 3  . ibuprofen (ADVIL) 200 MG tablet Take 200 mg by mouth every 6 (six) hours as needed for fever or moderate pain.    . sacubitril-valsartan (ENTRESTO) 97-103 MG Take 1 tablet by mouth 2 (two) times daily. 60 tablet 3  . spironolactone (ALDACTONE) 25 MG tablet Take 0.5 tablets (12.5 mg total) by mouth daily. 45 tablet 3   No current facility-administered medications for this visit.     Past Medical History:  Diagnosis Date  . CHF (congestive heart failure) (HCC)   . Hypertension   . Obesity   . Snoring     Past Surgical History:  Procedure Laterality Date  . I & D EXTREMITY Right 03/04/2019   Procedure: IRRIGATION AND DEBRIDEMENT OF RIGHT ELBOW;  Surgeon: Dominica Severin, MD;  Location: [facility]  OR;  Service: Orthopedics;  Laterality: Right;  . IRRIGATION AND DEBRIDEMENT ABSCESS Left 03/04/2019   Procedure: Irrigation And Debridement Abscess Left hand;  Surgeon: Dominica Severin, MD;  Location: [facility] OR;  Service: Orthopedics;  Laterality: Left;  . UMBILICAL HERNIA REPAIR      Social History   Socioeconomic History  . Marital status: Single    Spouse name: Not on file  . Number of children: Not on file  . Years of education: Not on file  . Highest education level: Not on file  Occupational History  . Not on file  Tobacco Use  . Smoking status: Current Every Day Smoker  . Smokeless tobacco: Never Used  Vaping Use  . Vaping Use: Every day  . Substances: Nicotine  Substance and Sexual Activity  . 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 Drug use: Not Currently    Comment: Pt stated "It was opiates"  . Sexual activity: Not on file  Other Topics Concern  . Not on file  Social History Narrative  . Not on file   Social Determinants of Health   Financial Resource Strain: Not on file  Food Insecurity: Not on file  Transportation Needs: Not on file  Physical Activity: Not on file  Stress: Not on file  Social Connections: Not on file  Intimate Partner Violence: Not on file    Family History  Problem  Relation Age of Onset  . Hypertension Mother   . Hypertension Father     ROS: no fevers or chills, productive cough, hemoptysis, dysphasia, odynophagia, melena, hematochezia, dysuria, hematuria, rash, seizure activity, orthopnea, PND, pedal edema, claudication. Remaining systems are negative.  Physical Exam: Well-developed well-nourished in no acute distress.  Skin is warm and dry.  HEENT is normal.  Neck is supple.  Chest is clear to auscultation with normal expansion.  Cardiovascular exam is regular rate and rhythm.  Abdominal exam nontender or distended. No masses palpated. Extremities show no edema. neuro grossly intact  ECG-  personally reviewed  A/P  1 chronic systolic congestive heart failure-patient appears to be euvolemic.  Continue diuretics at present dose.  Check potassium and renal function.  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  Continue Entresto, carvedilol and digoxin.  Repeat echocardiogram to reassess LV function.  If ejection fraction remains decreased we will arrange cardiac CTA to exclude coronary disease.  3 hypertension-blood pressure is controlled.  Continue present medications.  4 history of substance abuse-patient denies recent use.  5 morbid obesity-we discussed the need for weight loss.  We will arrange evaluation with pulmonary for exclusion of sleep apnea.  Olga Millers, MD

## 2020-08-15 ENCOUNTER — Inpatient Hospital Stay (HOSPITAL_COMMUNITY): Payer: Self-pay

## 2020-08-15 ENCOUNTER — Emergency Department (HOSPITAL_COMMUNITY): Payer: Self-pay

## 2020-08-15 ENCOUNTER — Other Ambulatory Visit: Payer: Self-pay

## 2020-08-15 ENCOUNTER — Inpatient Hospital Stay (HOSPITAL_COMMUNITY)
Admission: EM | Admit: 2020-08-15 | Discharge: 2020-08-16 | DRG: 291 | Disposition: A | Discharge status: Home / Self Care | Payer: Self-pay | Attending: Internal Medicine | Admitting: Internal Medicine

## 2020-08-15 ENCOUNTER — Encounter (HOSPITAL_COMMUNITY): Payer: Self-pay | Admitting: Radiology

## 2020-08-15 DIAGNOSIS — F1910 Other psychoactive substance abuse, uncomplicated: Secondary | ICD-10-CM | POA: Diagnosis present

## 2020-08-15 DIAGNOSIS — Z20822 Contact with and (suspected) exposure to covid-19: Secondary | ICD-10-CM | POA: Diagnosis present

## 2020-08-15 DIAGNOSIS — Z79899 Other long term (current) drug therapy: Secondary | ICD-10-CM

## 2020-08-15 DIAGNOSIS — Z9114 Patient's other noncompliance with medication regimen: Secondary | ICD-10-CM

## 2020-08-15 DIAGNOSIS — I110 Hypertensive heart disease with heart failure: Principal | ICD-10-CM | POA: Diagnosis present

## 2020-08-15 DIAGNOSIS — R0902 Hypoxemia: Secondary | ICD-10-CM

## 2020-08-15 DIAGNOSIS — I428 Other cardiomyopathies: Secondary | ICD-10-CM | POA: Diagnosis present

## 2020-08-15 DIAGNOSIS — I5022 Chronic systolic (congestive) heart failure: Secondary | ICD-10-CM

## 2020-08-15 DIAGNOSIS — I420 Dilated cardiomyopathy: Secondary | ICD-10-CM

## 2020-08-15 DIAGNOSIS — I493 Ventricular premature depolarization: Secondary | ICD-10-CM

## 2020-08-15 DIAGNOSIS — F1110 Opioid abuse, uncomplicated: Secondary | ICD-10-CM | POA: Diagnosis present

## 2020-08-15 DIAGNOSIS — I5023 Acute on chronic systolic (congestive) heart failure: Secondary | ICD-10-CM

## 2020-08-15 DIAGNOSIS — I509 Heart failure, unspecified: Secondary | ICD-10-CM

## 2020-08-15 DIAGNOSIS — R7989 Other specified abnormal findings of blood chemistry: Secondary | ICD-10-CM | POA: Diagnosis present

## 2020-08-15 DIAGNOSIS — I5043 Acute on chronic combined systolic (congestive) and diastolic (congestive) heart failure: Secondary | ICD-10-CM | POA: Diagnosis present

## 2020-08-15 DIAGNOSIS — I10 Essential (primary) hypertension: Secondary | ICD-10-CM | POA: Diagnosis present

## 2020-08-15 DIAGNOSIS — Z6841 Body Mass Index (BMI) 40.0 and over, adult: Secondary | ICD-10-CM

## 2020-08-15 DIAGNOSIS — Z8249 Family history of ischemic heart disease and other diseases of the circulatory system: Secondary | ICD-10-CM

## 2020-08-15 LAB — ECHOCARDIOGRAM COMPLETE
Area-P 1/2: 10.84 cm2
Height: 71 in
S' Lateral: 5.78 cm
Weight: 4881.6 oz

## 2020-08-15 LAB — BASIC METABOLIC PANEL
Anion gap: 8 (ref 5–15)
BUN: 21 mg/dL — ABNORMAL HIGH (ref 6–20)
CO2: 21 mmol/L — ABNORMAL LOW (ref 22–32)
Calcium: 8.5 mg/dL — ABNORMAL LOW (ref 8.9–10.3)
Chloride: 110 mmol/L (ref 98–111)
Creatinine, Ser: 1.03 mg/dL (ref 0.61–1.24)
GFR, Estimated: >60 mL/min (ref >60)
Glucose, Bld: 99 mg/dL (ref 70–99)
Potassium: 4.1 mmol/L (ref 3.5–5.1)
Sodium: 139 mmol/L (ref 135–145)

## 2020-08-15 LAB — CBC
HCT: 42.8 % (ref 39.0–52.0)
Hemoglobin: 13.2 g/dL (ref 13.0–17.0)
MCH: 24.8 pg — ABNORMAL LOW (ref 26.0–34.0)
MCHC: 30.8 g/dL (ref 30.0–36.0)
MCV: 80.3 fL (ref 80.0–100.0)
Platelets: 313 10*3/uL (ref 150–400)
RBC: 5.33 MIL/uL (ref 4.22–5.81)
RDW: 16.7 % — ABNORMAL HIGH (ref 11.5–15.5)
WBC: 11.5 10*3/uL — ABNORMAL HIGH (ref 4.0–10.5)
nRBC: 0 % (ref 0.0–0.2)

## 2020-08-15 LAB — DIGOXIN LEVEL: Digoxin Level: <0.2 ng/mL — ABNORMAL LOW (ref 0.8–2.0)

## 2020-08-15 LAB — RESP PANEL BY RT-PCR (FLU A&B, COVID) ARPGX2
Influenza A by PCR: NEGATIVE
Influenza B by PCR: NEGATIVE
SARS Coronavirus 2 by RT PCR: NEGATIVE

## 2020-08-15 LAB — BRAIN NATRIURETIC PEPTIDE: B Natriuretic Peptide: 891.6 pg/mL — ABNORMAL HIGH (ref 0.0–100.0)

## 2020-08-15 LAB — HIV ANTIBODY (ROUTINE TESTING W REFLEX): HIV Screen 4th Generation wRfx: NONREACTIVE

## 2020-08-15 MED ORDER — SACUBITRIL-VALSARTAN 97-103 MG PO TABS
1.0000 | ORAL_TABLET | Freq: Two times a day (BID) | ORAL | Status: DC
  Administered 2020-08-15 – 2020-08-16 (×2): 1 via ORAL
  Filled 2020-08-15 (×3): qty 1

## 2020-08-15 MED ORDER — ACETAMINOPHEN 650 MG RE SUPP: 650.0000 mg | Freq: Four times a day (QID) | RECTAL | Status: DC | PRN

## 2020-08-15 MED ORDER — IOHEXOL 350 MG/ML SOLN
100.0000 mL | Freq: Once | INTRAVENOUS | Status: AC | PRN
  Administered 2020-08-15: 75 mL via INTRAVENOUS

## 2020-08-15 MED ORDER — SODIUM CHLORIDE 0.9% FLUSH
3.0000 mL | Freq: Two times a day (BID) | INTRAVENOUS | Status: DC
  Administered 2020-08-16: 3 mL via INTRAVENOUS

## 2020-08-15 MED ORDER — ACETAMINOPHEN 325 MG PO TABS: 650.0000 mg | ORAL_TABLET | Freq: Four times a day (QID) | ORAL | Status: DC | PRN

## 2020-08-15 MED ORDER — SACUBITRIL-VALSARTAN 97-103 MG PO TABS
1.0000 | ORAL_TABLET | Freq: Two times a day (BID) | ORAL | Status: DC
  Filled 2020-08-15 (×2): qty 1

## 2020-08-15 MED ORDER — FUROSEMIDE 10 MG/ML IJ SOLN
60.0000 mg | Freq: Two times a day (BID) | INTRAMUSCULAR | Status: DC
  Administered 2020-08-15 – 2020-08-16 (×2): 60 mg via INTRAVENOUS
  Filled 2020-08-15 (×2): qty 6

## 2020-08-15 MED ORDER — POLYETHYLENE GLYCOL 3350 17 G PO PACK: 17.0000 g | PACK | Freq: Every day | ORAL | Status: DC | PRN

## 2020-08-15 MED ORDER — CARVEDILOL 3.125 MG PO TABS
6.2500 mg | ORAL_TABLET | Freq: Two times a day (BID) | ORAL | Status: DC
  Administered 2020-08-15 – 2020-08-16 (×2): 6.25 mg via ORAL
  Filled 2020-08-15 (×2): qty 2

## 2020-08-15 MED ORDER — DIGOXIN 125 MCG PO TABS
0.1250 mg | ORAL_TABLET | Freq: Every day | ORAL | Status: DC
  Administered 2020-08-15 – 2020-08-16 (×2): 0.125 mg via ORAL
  Filled 2020-08-15 (×3): qty 1

## 2020-08-15 MED ORDER — FUROSEMIDE 10 MG/ML IJ SOLN
60.0000 mg | Freq: Once | INTRAMUSCULAR | Status: AC
  Administered 2020-08-15: 60 mg via INTRAVENOUS
  Filled 2020-08-15: qty 8

## 2020-08-15 MED ORDER — ENOXAPARIN SODIUM 80 MG/0.8ML ~~LOC~~ SOLN
70.0000 mg | SUBCUTANEOUS | Status: DC
  Administered 2020-08-15: 70 mg via SUBCUTANEOUS
  Filled 2020-08-15: qty 0.8
  Filled 2020-08-15: qty 0.7

## 2020-08-15 NOTE — ED Triage Notes (Signed)
Pt POV reports worsening ShOB x2 weeks.  Reports orthopnea. Pt reports being compliant with all meds, denies new swelling.

## 2020-08-15 NOTE — ED Notes (Signed)
Ambulated patient 400 ft. Patients oxygen dropped to 77

## 2020-08-15 NOTE — ED Provider Notes (Signed)
[facility]-EMERGENCY DEPT Provider Note   CSN: [id number] Arrival date & time: 08/15/20  0756     History Chief Complaint  Patient presents with  . Shortness of Breath    Donald Murray is a 35 y.o. male presenting for evaluation of shortness of breathe.   Pt states for the past 2 weeks, he has had DOE and orthopnea. This has worsened significantly over the past few days. He states he is waking up calling for help multiple times a night due to not being able to breathe. He has a mild associated nonproductive cough. He denies fevers, chills, chest pain, nausea vomiting abdominal pain.  He states he is taking all of his medications as prescribed including digoxin, spironolactone, Lasix, carvedilol, Entresto.  Is following with 2 separate cardiologists, has not been in with 1 next week.  He was diagnosed with CHF several months ago, unknown cause.  Additional history obtained per chart review.  Per chart review, patient with a history of obesity, hypertension, CHF.  Patient was diagnosed in 01-2020, at that time echo showed an EF of 20 to 25%.  Patient supposed to get a repeat echo, but has not yet done so.  He has had multiple medication changes over the past several months for further symptom control.  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   HPI     Past Medical History:  Diagnosis Date  . CHF (congestive heart failure) (HCC)   . Hypertension   . Obesity   . Snoring     Patient Active Problem List   Diagnosis Date Noted  . Essential hypertension 03/20/2020  . Acute congestive heart failure (HCC)   . Cellulitis 03/04/2019  . IVDU (intravenous drug user) 03/04/2019    Past Surgical History:  Procedure Laterality Date  . I & D EXTREMITY Right 03/04/2019   Procedure: IRRIGATION AND DEBRIDEMENT OF RIGHT ELBOW;  Surgeon: Dominica Severin, MD;  Location: [facility] OR;  Service: Orthopedics;  Laterality: Right;  . IRRIGATION AND DEBRIDEMENT ABSCESS Left  03/04/2019   Procedure: Irrigation And Debridement Abscess Left hand;  Surgeon: Dominica Severin, MD;  Location: [facility] OR;  Service: Orthopedics;  Laterality: Left;  . UMBILICAL HERNIA REPAIR         Family History  Problem Relation Age of Onset  . Hypertension Mother   . Hypertension Father     Social History   Tobacco Use  . Smoking status: Current Every Day Smoker  . Smokeless tobacco: Never Used  Vaping Use  . Vaping Use: Every day  . Substances: Nicotine  Substance Use Topics  . 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 Drug use: Not Currently    Comment: Pt stated "It was opiates"    Home Medications Prior to Admission medications   Medication Sig Start Date End Date Taking? Authorizing Provider  carvedilol (COREG) 6.25 MG tablet Take 1 tablet (6.25 mg total) by mouth 2 (two) times daily with a meal. 05/23/20   Bensimhon, Bevelyn Buckles, MD  digoxin (LANOXIN) 0.125 MG tablet Take 1 tablet (0.125 mg total) by mouth daily. 04/24/20   Bensimhon, Bevelyn Buckles, MD  furosemide (LASIX) 20 MG tablet Take 3 tablets (60 mg total) by mouth daily. Take additional 20 mg daily for edema or weight gain of 3 lbs in 1 day or 5 lbs in 2 days. 05/06/20   Lewayne Bunting, MD  ibuprofen (ADVIL) 200 MG tablet Take 200 mg  by mouth every 6 (six) hours as needed for fever or moderate pain.    [provider]  sacubitril-valsartan (ENTRESTO) 97-103 MG Take 1 tablet by mouth 2 (two) times daily. 05/16/20   Bensimhon, Bevelyn Buckles, MD  spironolactone (ALDACTONE) 25 MG tablet Take 0.5 tablets (12.5 mg total) by mouth daily. 03/06/20 06/04/20  Lewayne Bunting, MD    Allergies    Patient has no known allergies.  Review of Systems   Review of Systems  Respiratory: Positive for cough and shortness of breath.   All other systems reviewed and are negative.   Physical Exam Updated Vital Signs BP (!) 115/103   Pulse (!) 109   Temp 98.6 F (37 C) (Oral)   Resp (!) 25   Ht 5'  11" (1.803 m)   Wt (!) 138.4 kg   SpO2 98%   BMI 42.55 kg/m   Physical Exam Vitals and nursing note reviewed.  Constitutional:      General: He is not in acute distress.    Appearance: He is well-developed. He is obese.  HENT:     Head: Normocephalic and atraumatic.  Eyes:     Conjunctiva/sclera: Conjunctivae normal.     Pupils: Pupils are equal, round, and reactive to light.  Cardiovascular:     Rate and Rhythm: Regular rhythm. Tachycardia present.     Pulses: Normal pulses.     Comments: Tachycardic around 115 Pulmonary:     Effort: Pulmonary effort is normal. No respiratory distress.     Breath sounds: Rales present. No wheezing.     Comments: Rales in bilateral lower bases. Intermittent cough noted on exam. Pt tachypneic. Speaking in short sentences. SpO2 stable on RA.  Abdominal:     General: There is no distension.     Palpations: Abdomen is soft. There is no mass.     Tenderness: There is no abdominal tenderness. There is no guarding or rebound.  Musculoskeletal:        General: Normal range of motion.     Cervical back: Normal range of motion and neck supple.  Skin:    General: Skin is warm and dry.     Capillary Refill: Capillary refill takes less than 2 seconds.  Neurological:     Mental Status: He is alert and oriented to person, place, and time.     ED Results / Procedures / Treatments   Labs (all labs ordered are listed, but only abnormal results are displayed) Labs Reviewed  BASIC METABOLIC PANEL - Abnormal; Notable for the following components:      Result Value   CO2 21 (*)    BUN 21 (*)    Calcium 8.5 (*)    All other components within normal limits  CBC - Abnormal; Notable for the following components:   WBC 11.5 (*)    MCH 24.8 (*)    RDW 16.7 (*)    All other components within normal limits  BRAIN NATRIURETIC PEPTIDE - Abnormal; Notable for the following components:   B Natriuretic Peptide 891.6 (*)    All other components within normal  limits  DIGOXIN LEVEL    EKG None  Radiology DG Chest 2 View  Result Date: 08/15/2020 CLINICAL DATA:  Shortness of breath for 2 weeks EXAM: CHEST - 2 VIEW COMPARISON:  02/16/2020 FINDINGS: Cardiac shadow is enlarged but stable. Mild vascular congestion remains. Lungs are well aerated bilaterally. No focal infiltrate or sizable effusion is seen. No acute bony abnormality is noted. IMPRESSION:  Mild vascular congestion stable from the prior exam. No acute bony abnormality noted. Electronically Signed   By: Alcide Clever M.D.   On: 08/15/2020 08:59   CT Angio Chest PE W and/or Wo Contrast  Result Date: 08/15/2020 CLINICAL DATA:  Dyspnea, congestive heart failure EXAM: CT ANGIOGRAPHY CHEST WITH CONTRAST TECHNIQUE: Multidetector CT imaging of the chest was performed using the standard protocol during bolus administration of intravenous contrast. Multiplanar CT image reconstructions and MIPs were obtained to evaluate the vascular anatomy. CONTRAST:  46mL OMNIPAQUE IOHEXOL 350 MG/ML SOLN COMPARISON:  None. FINDINGS: Cardiovascular: There is adequate opacification of the central pulmonary arteries. Apparent filling defect within the a superior segmental pulmonary artery of the right lower lobe is artifactual and related to respiratory motion artifact. There is no true intraluminal filling defect identified to suggest acute pulmonary embolism. The central pulmonary arteries are of normal caliber. No significant coronary artery calcification. Mild cardiomegaly with mild left ventricular dilation. No pericardial effusion. The thoracic aorta is unremarkable. Mediastinum/Nodes: No enlarged mediastinal, hilar, or axillary lymph nodes. Thyroid gland, trachea, and esophagus demonstrate no significant findings. Lungs/Pleura: Scattered nodular infiltrates are seen within the right upper lobe and right middle lobe, possibly infectious in the appropriate clinical setting. Mild bibasilar atelectasis. The lungs are otherwise  clear. No pneumothorax or pleural effusion. The central airways are widely patent. Upper Abdomen: No acute abnormality within the visualized upper abdomen. Musculoskeletal: No acute bone abnormality. Review of the MIP images confirms the above findings. IMPRESSION: No pulmonary embolism. Mild cardiomegaly with mild left ventricular dilation. Scattered nodular infiltrate within the right upper lobe and right middle lobe, possibly infectious in the appropriate clinical setting. Electronically Signed   By: Helyn Numbers MD   On: 08/15/2020 11:38    Procedures Procedures   Medications Ordered in ED Medications  furosemide (LASIX) injection 60 mg (60 mg Intravenous Given 08/15/20 0932)    ED Course  I have reviewed the triage vital signs and the nursing notes.  Pertinent labs & imaging results that were available during my care of the patient were reviewed by me and considered in my medical decision making (see chart for details).    MDM Rules/Calculators/A&P                          Patient presented for evaluation of dyspnea on exertion and orthopnea.  On exam, patient appears nontoxic, though he is mildly tachycardic and tachypneic.  He does have rales in bilateral lower lobes.  Concern for worsening heart failure symptoms.  Sats are stable on room air however.  Less likely infectious cause, as patient is without fever or worsening cough.  Less likely ACS as patient is without chest pain.  Consider PE.  Will obtain labs, chest x-ray, EKG.  Labs interpreted by me, overall reassuring.  Chest x-ray viewed interpreted by me, no obvious pleural effusion, does show stable congestion.  Patient has good urine output with IV Lasix, however continues to be symptomatic.  When ambulated, patient sats dropped to 77%.  CTA ordered.  CTA negative for PE.  Discussed findings with patient.  Discussed my concern for worsening heart failure.  Covid test was ordered.  Will call for admission.  Discussed with Dr.  Dairl Ponder from [facility]  service, patient to be admitted.  Final Clinical Impression(s) / ED Diagnoses Final diagnoses:  Acute on chronic congestive heart failure, unspecified heart failure type (HCC)  Hypoxia    Rx / DC  Orders ED Discharge Orders    None       Alveria Apley, PA-C 08/15/20 1228    Mancel Bale, MD 08/15/20 [phone]

## 2020-08-15 NOTE — ED Notes (Signed)
[facility] called for transport. 

## 2020-08-15 NOTE — ED Notes (Signed)
Pt transported to CT ?

## 2020-08-15 NOTE — Progress Notes (Signed)
  Echocardiogram 2D Echocardiogram has been performed.  Donald Murray 08/15/2020, 3:29 PM

## 2020-08-15 NOTE — H&P (Signed)
History and Physical        Hospital Admission Note Date: 08/15/2020  Patient name: Donald Murray Medical record number: [id number] Date of birth: 03-26-1986 Age: 35 y.o. Gender: male  PCP: Patient, [name]   Chief Complaint    Chief Complaint  Patient presents with  . Shortness of Breath      HPI:   This is a 35 year old male with past medical history of chronic systolic heart failure (EF 20 to 25%) suspect secondary to hypertension versus PVC mediated, hypertension, distant history of IV drug use, morbid obesity who presented to the ED with 2 weeks of dyspnea on exertion and orthopnea which has worsened over the past several days.  His symptoms have been waking him up at night due to not being able to breathe.  Reports he is generally compliant with his medications though will occasionally forget to take his medications up to 3 times weekly.  He also states that he eats fast food regularly and his typical meal was to bacon cheeseburgers and a small fry from [employer] and will snack on [employer] at work.  Patient follows with Dr. Jens Som and Dr. Gala Romney for his heart failure.  Mild associated nonproductive cough.  Denies fever, chills, lower extremity swelling, weight gain, chest pain, nausea or vomiting.   ED Course: Afebrile, tachycardic, tachypneic,  hypertensive, SpO2 dropped to 77% with ambulation. Notable Labs: Sodium 139, K4.1, BUN 21, creatinine 1.03, BNP 891, WBC 11.5, Hb 13.2, digoxin 0.2 (low), COVID-19 pending. Notable Imaging: CXR-mild vascular congestion stable from prior exam.  CTA chest-negative for PE, mild cardiomegaly with mild left ventricular dilatation, scattered nodular infiltrate within the RUL and RML possibly infectious. Patient received Lasix 60 mg IV x1.    Vitals:   08/15/20 1145 08/15/20 1200  BP:  (!) 107/91  Pulse: (!) 105 (!) 50  Resp: (!)  35 18  Temp:    SpO2: 97% 98%     Review of Systems:  Review of Systems  All other systems reviewed and are negative.   Medical/Social/Family History   Past Medical History: Past Medical History:  Diagnosis Date  . CHF (congestive heart failure) (HCC)   . Hypertension   . Obesity   . Snoring     Past Surgical History:  Procedure Laterality Date  . I & D EXTREMITY Right 03/04/2019   Procedure: IRRIGATION AND DEBRIDEMENT OF RIGHT ELBOW;  Surgeon: Dominica Severin, MD;  Location: [facility] OR;  Service: Orthopedics;  Laterality: Right;  . IRRIGATION AND DEBRIDEMENT ABSCESS Left 03/04/2019   Procedure: Irrigation And Debridement Abscess Left hand;  Surgeon: Dominica Severin, MD;  Location: [facility] OR;  Service: Orthopedics;  Laterality: Left;  . UMBILICAL HERNIA REPAIR      Medications: Prior to Admission medications   Medication Sig Start Date End Date Taking? Authorizing Provider  carvedilol (COREG) 6.25 MG tablet Take 1 tablet (6.25 mg total) by mouth 2 (two) times daily with a meal. 05/23/20   Bensimhon, Bevelyn Buckles, MD  digoxin (LANOXIN) 0.125 MG tablet Take 1 tablet (0.125 mg total) by mouth daily. 04/24/20   Bensimhon, Bevelyn Buckles, MD  furosemide (LASIX) 20 MG tablet Take 3 tablets (60 mg total) by mouth daily.  Take additional 20 mg daily for edema or weight gain of 3 lbs in 1 day or 5 lbs in 2 days. 05/06/20   Lewayne Bunting, MD  ibuprofen (ADVIL) 200 MG tablet Take 200 mg by mouth every 6 (six) hours as needed for fever or moderate pain.    [provider]  sacubitril-valsartan (ENTRESTO) 97-103 MG Take 1 tablet by mouth 2 (two) times daily. 05/16/20   Bensimhon, Bevelyn Buckles, MD  spironolactone (ALDACTONE) 25 MG tablet Take 0.5 tablets (12.5 mg total) by mouth daily. 03/06/20 06/04/20  Lewayne Bunting, MD    Allergies:  No Known Allergies  Social History:  reports that he has been smoking. He has never used smokeless tobacco. He reports previous alcohol use. He reports  previous drug use.  Family History: Family History  Problem Relation Age of Onset  . Hypertension Mother   . Hypertension Father      Objective   Physical Exam: Blood pressure (!) 107/91, pulse (!) 50, temperature 98.6 F (37 C), temperature source Oral, resp. rate 18, height 5\' 11"  (1.803 m), weight (!) 138.4 kg, SpO2 98 %.  Physical Exam Vitals and nursing note reviewed.  Constitutional:      Appearance: Normal appearance. He is obese.  HENT:     Head: Normocephalic and atraumatic.  Eyes:     Conjunctiva/sclera: Conjunctivae normal.  Cardiovascular:     Rate and Rhythm: Normal rate and regular rhythm.  Pulmonary:     Effort: Pulmonary effort is normal.     Breath sounds: Examination of the right-lower field reveals rales. Examination of the left-lower field reveals rales. Rales present.  Abdominal:     General: Abdomen is flat.     Palpations: Abdomen is soft.  Musculoskeletal:        General: No swelling or tenderness.  Skin:    Coloration: Skin is not jaundiced or pale.  Neurological:     Mental Status: He is alert. Mental status is at baseline.  Psychiatric:        Mood and Affect: Mood normal.        Behavior: Behavior normal.     LABS on Admission: I have personally reviewed all the labs and imaging below    Basic Metabolic Panel: Recent Labs  Lab 08/15/20 0858  NA 139  K 4.1  CL 110  CO2 21*  GLUCOSE 99  BUN 21*  CREATININE 1.03  CALCIUM 8.5*   Liver Function Tests: No results for input(s): AST, ALT, ALKPHOS, BILITOT, PROT, ALBUMIN in the last 168 hours. No results for input(s): LIPASE, AMYLASE in the last 168 hours. No results for input(s): AMMONIA in the last 168 hours. CBC: Recent Labs  Lab 08/15/20 0858  WBC 11.5*  HGB 13.2  HCT 42.8  MCV 80.3  PLT 313   Cardiac Enzymes: No results for input(s): CKTOTAL, CKMB, CKMBINDEX, TROPONINI in the last 168 hours. BNP: Invalid input(s): POCBNP CBG: No results for input(s): GLUCAP in the  last 168 hours.  Radiological Exams on Admission:  DG Chest 2 View  Result Date: 08/15/2020 CLINICAL DATA:  Shortness of breath for 2 weeks EXAM: CHEST - 2 VIEW COMPARISON:  02/16/2020 FINDINGS: Cardiac shadow is enlarged but stable. Mild vascular congestion remains. Lungs are well aerated bilaterally. No focal infiltrate or sizable effusion is seen. No acute bony abnormality is noted. IMPRESSION: Mild vascular congestion stable from the prior exam. No acute bony abnormality noted. Electronically Signed   By: 02/18/2020.D.  On: 08/15/2020 08:59   CT Angio Chest PE W and/or Wo Contrast  Result Date: 08/15/2020 CLINICAL DATA:  Dyspnea, congestive heart failure EXAM: CT ANGIOGRAPHY CHEST WITH CONTRAST TECHNIQUE: Multidetector CT imaging of the chest was performed using the standard protocol during bolus administration of intravenous contrast. Multiplanar CT image reconstructions and MIPs were obtained to evaluate the vascular anatomy. CONTRAST:  65mL OMNIPAQUE IOHEXOL 350 MG/ML SOLN COMPARISON:  None. FINDINGS: Cardiovascular: There is adequate opacification of the central pulmonary arteries. Apparent filling defect within the a superior segmental pulmonary artery of the right lower lobe is artifactual and related to respiratory motion artifact. There is no true intraluminal filling defect identified to suggest acute pulmonary embolism. The central pulmonary arteries are of normal caliber. No significant coronary artery calcification. Mild cardiomegaly with mild left ventricular dilation. No pericardial effusion. The thoracic aorta is unremarkable. Mediastinum/Nodes: No enlarged mediastinal, hilar, or axillary lymph nodes. Thyroid gland, trachea, and esophagus demonstrate no significant findings. Lungs/Pleura: Scattered nodular infiltrates are seen within the right upper lobe and right middle lobe, possibly infectious in the appropriate clinical setting. Mild bibasilar atelectasis. The lungs are otherwise  clear. No pneumothorax or pleural effusion. The central airways are widely patent. Upper Abdomen: No acute abnormality within the visualized upper abdomen. Musculoskeletal: No acute bone abnormality. Review of the MIP images confirms the above findings. IMPRESSION: No pulmonary embolism. Mild cardiomegaly with mild left ventricular dilation. Scattered nodular infiltrate within the right upper lobe and right middle lobe, possibly infectious in the appropriate clinical setting. Electronically Signed   By: Helyn Numbers MD   On: 08/15/2020 11:38      EKG: sinus tachycardia, frequent PVC's noted   A & P   Principal Problem:   Acute CHF (congestive heart failure) (HCC) Active Problems:   Essential hypertension   PVC (premature ventricular contraction)   1. Dyspnea suspect secondary to acute on chronic systolic heart failure, Likely from dietary indiscretion and medication noncompliance a. Echo 02/16/2020: EF 20 to 25%.  Was told to go for a repeat echo but this has not been done b. Heart failure was thought to be due to hypertension vs PVC mediated in the past and has not had an ischemic work-up as of yet c. Patient desaturated to 77% on room air with exertion return to baseline at rest on room air d. Check echo e. Continue home carvedilol, digoxin, Entresto f. Lasix 60 mg IV twice daily g. Follow daily weights and intake/output h. May need to start Farxiga prior to discharge i. Encouraged low-salt diet and medication compliance - will consult the dietitian for dietary education j. Cardiology consulted, discussed with Dr. Anne Fu.  Plan for admission to [facility] in case patient needs an ischemic work-up heart failure team eval  2. Hypertension a. Plan as above  3. PVCs a. Continue beta-blocker and telemetry  4. Abnormal CT chest: Scattered nodular infiltrate in right upper lobe and right middle lobe of unclear etiology a. Patient is afebrile and does not appear to have an  infection b. Follow-up COVID-19 testing c. Hold antibiotics for now    DVT prophylaxis: Lovenox   Code Status: Full Code  Diet: Low-salt Family Communication: Admission, patients condition and plan of care including tests being ordered have been discussed with the patient who indicates understanding and agrees with the plan and Code Status.  Disposition Plan: The appropriate patient status for this patient is INPATIENT. Inpatient status is judged to be reasonable and necessary in order to provide the required  intensity of service to ensure the patient's safety. The patient's presenting symptoms, physical exam findings, and initial radiographic and laboratory data in the context of their chronic comorbidities is felt to place them at high risk for further clinical deterioration. Furthermore, it is not anticipated that the patient will be medically stable for discharge from the hospital within 2 midnights of admission. The following factors support the patient status of inpatient.   " The patient's presenting symptoms include shortness of breath. " The worrisome physical exam findings include rales. " The initial radiographic and laboratory data are worrisome because of elevated BNP, CXR concerning for heart failure. " The chronic co-morbidities include chronic systolic heart failure, hypertension, PVCs.   * I certify that at the point of admission it is my clinical judgment that the patient will require inpatient hospital care spanning beyond 2 midnights from the point of admission due to high intensity of service, high risk for further deterioration and high frequency of surveillance required.*    The medical decision making on this patient was of high complexity and the patient is at high risk for clinical deterioration, therefore this is a level 3  admission.  Consultants  . Cardiology  Procedures  . None  Time Spent on Admission: 70 minutes    Jae Dire, DO [facility]  08/15/2020, 1:19 PM

## 2020-08-15 NOTE — ED Notes (Signed)
Attempted IV start x2,  Unsuccessful. Will request 2nd RN to start IV

## 2020-08-16 DIAGNOSIS — Z720 Tobacco use: Secondary | ICD-10-CM

## 2020-08-16 DIAGNOSIS — I5021 Acute systolic (congestive) heart failure: Secondary | ICD-10-CM

## 2020-08-16 DIAGNOSIS — I5041 Acute combined systolic (congestive) and diastolic (congestive) heart failure: Secondary | ICD-10-CM

## 2020-08-16 LAB — BASIC METABOLIC PANEL
Anion gap: 11 (ref 5–15)
BUN: 18 mg/dL (ref 6–20)
CO2: 26 mmol/L (ref 22–32)
Calcium: 9 mg/dL (ref 8.9–10.3)
Chloride: 102 mmol/L (ref 98–111)
Creatinine, Ser: 1.22 mg/dL (ref 0.61–1.24)
GFR, Estimated: >60 mL/min (ref >60)
Glucose, Bld: 83 mg/dL (ref 70–99)
Potassium: 3.7 mmol/L (ref 3.5–5.1)
Sodium: 139 mmol/L (ref 135–145)

## 2020-08-16 LAB — GLUCOSE, CAPILLARY: Glucose-Capillary: 95 mg/dL (ref 70–99)

## 2020-08-16 LAB — CBC
HCT: 50.3 % (ref 39.0–52.0)
Hemoglobin: 15.7 g/dL (ref 13.0–17.0)
MCH: 25 pg — ABNORMAL LOW (ref 26.0–34.0)
MCHC: 31.2 g/dL (ref 30.0–36.0)
MCV: 80 fL (ref 80.0–100.0)
Platelets: 338 10*3/uL (ref 150–400)
RBC: 6.29 MIL/uL — ABNORMAL HIGH (ref 4.22–5.81)
RDW: 17.6 % — ABNORMAL HIGH (ref 11.5–15.5)
WBC: 11.1 10*3/uL — ABNORMAL HIGH (ref 4.0–10.5)
nRBC: 0 % (ref 0.0–0.2)

## 2020-08-16 MED ORDER — PERFLUTREN LIPID MICROSPHERE
1.0000 mL | INTRAVENOUS | Status: AC | PRN
  Filled 2020-08-16: qty 10

## 2020-08-16 NOTE — Plan of Care (Signed)
  Problem: Activity: Goal: Capacity to carry out activities will improve Outcome: Completed/Met   Problem: Skin Integrity: Goal: Risk for impaired skin integrity will decrease Outcome: Completed/Met   Problem: Safety: Goal: Ability to remain free from injury will improve Outcome: Completed/Met   Problem: Pain Managment: Goal: General experience of comfort will improve Outcome: Completed/Met   Problem: Nutrition: Goal: Adequate nutrition will be maintained Outcome: Completed/Met

## 2020-08-16 NOTE — Progress Notes (Signed)
D/C instructions given and reviewed. Tele and IV removed, tolerated well. Security will transport to his private vehicle at [facility].

## 2020-08-16 NOTE — Plan of Care (Signed)

## 2020-08-16 NOTE — Discharge Summary (Signed)
Physician Discharge Summary  [name] [name] DOB: December 23, 1985 DOA: 08/15/2020  PCP: Patient, [name]  Admit date: 08/15/2020 Discharge date: 08/16/2020  Admitted From: Home Disposition: Home  Recommendations for Outpatient Follow-up:  1. Follow up with PCP in 1-2 weeks 2. Follow-up with advanced heart failure clinic as scheduled 3. Continue to encourage compliance with home medications 4. Would benefit from outpatient sleep study, suspect OSA  Home Health: No Equipment/Devices: None  Discharge Condition: Stable CODE STATUS: Full code Diet recommendation: Heart healthy diet  History of present illness:  Donald Murray is a 35 year old male with past medical history significant for chronic systolic congestive heart failure )EF 20-25%), essential hypertension, distant history IVDU, morbid obesity who presented to the ED with 2-week history of progressive shortness of breath on exertion with orthopnea.  Unable to sleep at night due to inability to breathe.  Patient reports compliance with his medications generally, but endorses occasion to forget to take his medications, sometimes up to 3 times weekly.  Patient reports he eats fast food regularly that is typically high in salt content.  Reports nonproductive cough.  Denies fever/chills/night sweats, no lower extremity swelling, no reported weight gain, no chest pain, no nausea/vomiting.  In the ED, temperature 98.6 F, HR 116, RR 36, BP 145/107, SPO2 97% on room air.  Patient did desaturate to 77% on room air on ambulation in the ED.  Sodium 139, potassium 4.1, chloride 110, CO2 21, glucose 99, BUN 21, creatinine 1.03.  WBC 11.5, hemoglobin 13.2, platelets 313.  BNP 891.6.  Digoxin less than 0.2.  Covid-19/influenza A/B PCR negative.  Chest x-ray with mild vascular congestion.  CT angiogram chest negative for PE but with subtle nodular infiltrates right upper lobe/right middle lobe.  Cardiology was consulted.  Hospital service  consulted for further evaluation and management.  Patient transferred from [place] to [facility] for advanced heart failure team evaluation.  Hospital course:  Acute on chronic combined systolic/diastolic congestive heart failure Patient presenting to the ED with progressive shortness of breath associated with orthopnea.  Suspect etiology poor medication compliance and dietary indiscretions as patient reports occasional noncompliance with his home medications and fast food use with high salt content.  Patient was noted to have an elevated BNP and vascular congestion on chest x-ray.  No pulmonary embolism on CT angiogram chest.  TTE with LVEF 20-25%, LV with severely decreased function but no regional wall motion abnormalities, grade 3 diastolic dysfunction, RV severely dilated, LA/RA severely dilated, normal IVC.  Cardiology was consulted and patient was transferred to [facility].  Patient was started on IV furosemide 60 mg every 12 hours with good diuresis improvement of symptoms.  Seen by cardiology and okay for discharge home, no medication changes and patient encouraged to maintain compliance with diet and his home medication regimen.  Outpatient follow-up with advanced heart failure clinic as scheduled.  Essential hypertension Carvedilol 6.25 mg p.o. twice daily, Entresto 97-103 mg p.o. twice daily, furosemide 60 mg p.o. daily  Tobacco use disorder Continues to endorse half pack per day and vaping.  Counseled on need for complete cessation.  History of IVDA with heroin Maintains abstinence, last reported use December 2020  Morbid obesity Body mass index is 43.49 kg/m.  Discussed with patient needs for aggressive lifestyle changes/weight loss as this complicates all facets of care.  Outpatient follow-up with PCP.  May benefit from bariatric evaluation outpatient and sleep study   Discharge Diagnoses:  Active Problems:   Essential hypertension  PVC (premature ventricular  contraction)    Discharge Instructions  Discharge Instructions    Call MD for:  difficulty breathing, headache or visual disturbances   Complete by: As directed    Call MD for:  extreme fatigue   Complete by: As directed    Call MD for:  persistant dizziness or light-headedness   Complete by: As directed    Call MD for:  persistant nausea and vomiting   Complete by: As directed    Call MD for:  severe uncontrolled pain   Complete by: As directed    Call MD for:  temperature >100.4   Complete by: As directed    Diet - low sodium heart healthy   Complete by: As directed    Increase activity slowly   Complete by: As directed      Allergies as of 08/16/2020   No Known Allergies     Medication List    TAKE these medications   carvedilol 6.25 MG tablet Commonly known as: COREG Take 1 tablet (6.25 mg total) by mouth 2 (two) times daily with a meal.   digoxin 0.125 MG tablet Commonly known as: LANOXIN Take 1 tablet (0.125 mg total) by mouth daily.   Entresto 97-103 MG Generic drug: sacubitril-valsartan Take 1 tablet by mouth 2 (two) times daily.   furosemide 20 MG tablet Commonly known as: Lasix Take 3 tablets (60 mg total) by mouth daily. Take additional 20 mg daily for edema or weight gain of 3 lbs in 1 day or 5 lbs in 2 days.   spironolactone 25 MG tablet Commonly known as: ALDACTONE Take 0.5 tablets (12.5 mg total) by mouth daily.       Follow-up Information    [facility]. Go on 10/01/2020.   Why: @2 :30pm Contact information: [address] [address] [phone]             No Known Allergies  Consultations:  Cardiology, Dr. Gala Romney   Procedures/Studies: DG Chest 2 View  Result Date: 08/15/2020 CLINICAL DATA:  Shortness of breath for 2 weeks EXAM: CHEST - 2 VIEW COMPARISON:  02/16/2020 FINDINGS: Cardiac shadow is enlarged but stable. Mild vascular congestion remains. Lungs are well  aerated bilaterally. No focal infiltrate or sizable effusion is seen. No acute bony abnormality is noted. IMPRESSION: Mild vascular congestion stable from the prior exam. No acute bony abnormality noted. Electronically Signed   By: Alcide Clever M.D.   On: 08/15/2020 08:59   CT Angio Chest PE W and/or Wo Contrast  Result Date: 08/15/2020 CLINICAL DATA:  Dyspnea, congestive heart failure EXAM: CT ANGIOGRAPHY CHEST WITH CONTRAST TECHNIQUE: Multidetector CT imaging of the chest was performed using the standard protocol during bolus administration of intravenous contrast. Multiplanar CT image reconstructions and MIPs were obtained to evaluate the vascular anatomy. CONTRAST:  73mL OMNIPAQUE IOHEXOL 350 MG/ML SOLN COMPARISON:  None. FINDINGS: Cardiovascular: There is adequate opacification of the central pulmonary arteries. Apparent filling defect within the a superior segmental pulmonary artery of the right lower lobe is artifactual and related to respiratory motion artifact. There is no true intraluminal filling defect identified to suggest acute pulmonary embolism. The central pulmonary arteries are of normal caliber. No significant coronary artery calcification. Mild cardiomegaly with mild left ventricular dilation. No pericardial effusion. The thoracic aorta is unremarkable. Mediastinum/Nodes: No enlarged mediastinal, hilar, or axillary lymph nodes. Thyroid gland, trachea, and esophagus demonstrate no significant findings. Lungs/Pleura: Scattered nodular infiltrates are seen within the  right upper lobe and right middle lobe, possibly infectious in the appropriate clinical setting. Mild bibasilar atelectasis. The lungs are otherwise clear. No pneumothorax or pleural effusion. The central airways are widely patent. Upper Abdomen: No acute abnormality within the visualized upper abdomen. Musculoskeletal: No acute bone abnormality. Review of the MIP images confirms the above findings. IMPRESSION: No pulmonary  embolism. Mild cardiomegaly with mild left ventricular dilation. Scattered nodular infiltrate within the right upper lobe and right middle lobe, possibly infectious in the appropriate clinical setting. Electronically Signed   By: Helyn NumbersAshesh  Parikh MD   On: 08/15/2020 11:38   ECHOCARDIOGRAM COMPLETE  Result Date: 08/15/2020    ECHOCARDIOGRAM REPORT   Patient Name:   Donald ReamerRAVIS Bullard Date of Exam: 08/15/2020 Medical Rec #:  [id number]     Height:       71.0 in Accession #:    [id number]    Weight:       305.1 lb Date of Birth:  11-02-1985    BSA:          2.523 m Patient Age:    34 years      BP:           107/91 mmHg Patient Gender: M             HR:           50 bpm. Exam Location:  Inpatient Procedure: 2D Echo, Pediatric Echo, Cardiac Doppler and Intracardiac            Opacification Agent Indications:    CHF  History:        Patient has prior history of Echocardiogram examinations, most                 recent 02/16/2020. CHF, Signs/Symptoms:Shortness of Breath; Risk                 Factors:Hypertension and Obesity.  Sonographer:    Lavenia AtlasBrooke Strickland Referring Phys: [id number] JARED E SEGAL IMPRESSIONS  1. Severe biventricular systolic dysfunction, restrictive pattern of diastolic dysfunction.  2. Left ventricular ejection fraction, by estimation, is 20 to 25%. The left ventricle has severely decreased function. The left ventricle has no regional wall motion abnormalities. The left ventricular internal cavity size was moderately dilated. There  is moderate concentric left ventricular hypertrophy. Left ventricular diastolic parameters are consistent with Grade III diastolic dysfunction (restrictive). Elevated left atrial pressure.  3. Right ventricular systolic function is severely reduced. The right ventricular size is moderately enlarged. There is normal pulmonary artery systolic pressure. The estimated right ventricular systolic pressure is 21.7 mmHg.  4. Left atrial size was severely dilated.  5. Right atrial size was  mildly dilated.  6. The mitral valve is normal in structure. Mild mitral valve regurgitation. No evidence of mitral stenosis.  7. The aortic valve is normal in structure. Aortic valve regurgitation is not visualized. No aortic stenosis is present.  8. The inferior vena cava is normal in size with greater than 50% respiratory variability, suggesting right atrial pressure of 3 mmHg. Comparison(s): No significant change from prior study. FINDINGS  Left Ventricle: Left ventricular ejection fraction, by estimation, is 20 to 25%. The left ventricle has severely decreased function. The left ventricle has no regional wall motion abnormalities. Definity contrast agent was given IV to delineate the left  ventricular endocardial borders. The left ventricular internal cavity size was moderately dilated. There is moderate concentric left ventricular hypertrophy. Left ventricular diastolic parameters are consistent with Grade III diastolic dysfunction (  restrictive). Elevated left atrial pressure. Right Ventricle: The right ventricular size is moderately enlarged. No increase in right ventricular wall thickness. Right ventricular systolic function is severely reduced. There is normal pulmonary artery systolic pressure. The tricuspid regurgitant velocity is 2.16 m/s, and with an assumed right atrial pressure of 3 mmHg, the estimated right ventricular systolic pressure is 21.7 mmHg. Left Atrium: Left atrial size was severely dilated. Right Atrium: Right atrial size was mildly dilated. Pericardium: There is no evidence of pericardial effusion. Mitral Valve: The mitral valve is normal in structure. Mild mitral valve regurgitation. No evidence of mitral valve stenosis. Tricuspid Valve: The tricuspid valve is normal in structure. Tricuspid valve regurgitation is mild . No evidence of tricuspid stenosis. Aortic Valve: The aortic valve is normal in structure. Aortic valve regurgitation is not visualized. No aortic stenosis is present.  Pulmonic Valve: The pulmonic valve was normal in structure. Pulmonic valve regurgitation is not visualized. No evidence of pulmonic stenosis. Aorta: The aortic root is normal in size and structure. Venous: The inferior vena cava is normal in size with greater than 50% respiratory variability, suggesting right atrial pressure of 3 mmHg. IAS/Shunts: No atrial level shunt detected by color flow Doppler.  LEFT VENTRICLE PLAX 2D LVIDd:         6.32 cm  Diastology LVIDs:         5.78 cm  LV e' medial:    6.31 cm/s LV PW:         1.26 cm  LV E/e' medial:  15.5 LV IVS:        1.29 cm  LV e' lateral:   11.50 cm/s LVOT diam:     2.80 cm  LV E/e' lateral: 8.5 LV SV:         57 LV SV Index:   22 LVOT Area:     6.16 cm  RIGHT VENTRICLE RV Basal diam:  3.96 cm RV S prime:     8.49 cm/s TAPSE (M-mode): 3.5 cm LEFT ATRIUM         Index LA diam:    5.70 cm 2.26 cm/m  AORTIC VALVE LVOT Vmax:   64.30 cm/s LVOT Vmean:  42.400 cm/s LVOT VTI:    0.092 m  AORTA Ao Root diam: 3.40 cm MITRAL VALVE               TRICUSPID VALVE MV Area (PHT): 10.84 cm   TR Peak grad:   18.7 mmHg MV Decel Time: 70 msec     TR Vmax:        216.00 cm/s MV E velocity: 98.00 cm/s MV A velocity: 31.50 cm/s  SHUNTS MV E/A ratio:  3.11        Systemic VTI:  0.09 m                            Systemic Diam: 2.80 cm Tobias Alexander MD Electronically signed by Tobias Alexander MD Signature Date/Time: 08/15/2020/3:59:35 PM    Final       Subjective: Patient seen and examined bedside, resting comfortably.  Breathing much improved.  Seen by cardiology and okay for discharge home today with outpatient follow-up.  No other questions or concerns at this time.  Encourage patient to maintain proper diet with low salt and adhere to medication regimen.  No other complaints or concerns at this time.  Denies headache, no chest pain, no palpitations, no shortness of breath, no abdominal pain, no weakness,  no fatigue, no paresthesias.  No acute events overnight per nursing  staff.  Discharge Exam: Vitals:   08/16/20 1037 08/16/20 1241  BP: (!) 134/96 (!) 81/58  Pulse: 96 96  Resp:  18  Temp:  98.1 F (36.7 C)  SpO2:  96%   Vitals:   08/16/20 0518 08/16/20 0800 08/16/20 1037 08/16/20 1241  BP: 94/61 108/70 (!) 134/96 (!) 81/58  Pulse: 89 97 96 96  Resp: 18 20  18   Temp: 97.6 F (36.4 C) 98.4 F (36.9 C)  98.1 F (36.7 C)  TempSrc: Oral Oral  Oral  SpO2: 98% 96%  96%  Weight: (!) 141.4 kg     Height:        General: Pt is alert, awake, not in acute distress, obese Cardiovascular: RRR, S1/S2 +, no rubs, no gallops, on room air Respiratory: CTA bilaterally, no wheezing, no rhonchi Abdominal: Soft, NT, ND, bowel sounds + Extremities: no edema, no cyanosis    The results of significant diagnostics from this hospitalization (including imaging, microbiology, ancillary and laboratory) are listed below for reference.     Microbiology: Recent Results (from the past 240 hour(s))  Resp Panel by RT-PCR (Flu A&B, Covid) Nasopharyngeal Swab     Status: None   Collection Time: 08/15/20 12:33 PM   Specimen: Nasopharyngeal Swab; Nasopharyngeal(NP) swabs in vial transport medium  Result Value Ref Range Status   SARS Coronavirus 2 by RT PCR NEGATIVE NEGATIVE Final    Comment: (NOTE) SARS-CoV-2 target nucleic acids are NOT DETECTED.  The SARS-CoV-2 RNA is generally detectable in upper respiratory specimens during the acute phase of infection. The lowest concentration of SARS-CoV-2 viral copies this assay can detect is 138 copies/mL. A negative result does not preclude SARS-Cov-2 infection and should not be used as the sole basis for treatment or other patient management decisions. A negative result may occur with  improper specimen collection/handling, submission of specimen other than nasopharyngeal swab, presence of viral mutation(s) within the areas targeted by this assay, and inadequate number of viral copies(<138 copies/mL). A negative result  must be combined with clinical observations, patient history, and epidemiological information. The expected result is Negative.  Fact Sheet for Patients:  08/17/20  Fact Sheet for Healthcare Providers:  BloggerCourse.com  This test is no t yet approved or cleared by the [place] FDA and  has been authorized for detection and/or diagnosis of SARS-CoV-2 by FDA under an Emergency Use Authorization (EUA). This EUA will remain  in effect (meaning this test can be used) for the duration of the COVID-19 declaration under Section 564(b)(1) of the Act, 21 U.S.C.section 360bbb-3(b)(1), unless the authorization is terminated  or revoked sooner.       Influenza A by PCR NEGATIVE NEGATIVE Final   Influenza B by PCR NEGATIVE NEGATIVE Final    Comment: (NOTE) The Xpert Xpress SARS-CoV-2/FLU/RSV plus assay is intended as an aid in the diagnosis of influenza from Nasopharyngeal swab specimens and should not be used as a sole basis for treatment. Nasal washings and aspirates are unacceptable for Xpert Xpress SARS-CoV-2/FLU/RSV testing.  Fact Sheet for Patients: [place]  Fact Sheet for Healthcare Providers: BloggerCourse.com  This test is not yet approved or cleared by the [place] FDA and has been authorized for detection and/or diagnosis of SARS-CoV-2 by FDA under an Emergency Use Authorization (EUA). This EUA will remain in effect (meaning this test can be used) for the duration of the COVID-19 declaration under Section 564(b)(1) of the Act, 21 U.S.C.  section 360bbb-3(b)(1), unless the authorization is terminated or revoked.  Performed at [facility], [address], [place], [place] [address]      Labs: BNP (last 3 results) Recent Labs    04/24/20 1337 05/16/20 1103 08/15/20 0858  BNP 680.4* 258.6* 891.6*   Basic Metabolic  Panel: Recent Labs  Lab 08/15/20 0858 08/16/20 0447  NA 139 139  K 4.1 3.7  CL 110 102  CO2 21* 26  GLUCOSE 99 83  BUN 21* 18  CREATININE 1.03 1.22  CALCIUM 8.5* 9.0   Liver Function Tests: No results for input(s): AST, ALT, ALKPHOS, BILITOT, PROT, ALBUMIN in the last 168 hours. No results for input(s): LIPASE, AMYLASE in the last 168 hours. No results for input(s): AMMONIA in the last 168 hours. CBC: Recent Labs  Lab 08/15/20 0858 08/16/20 0447  WBC 11.5* 11.1*  HGB 13.2 15.7  HCT 42.8 50.3  MCV 80.3 80.0  PLT 313 338   Cardiac Enzymes: No results for input(s): CKTOTAL, CKMB, CKMBINDEX, TROPONINI in the last 168 hours. BNP: Invalid input(s): POCBNP CBG: Recent Labs  Lab 08/16/20 0604  GLUCAP 95   D-Dimer No results for input(s): DDIMER in the last 72 hours. Hgb A1c No results for input(s): HGBA1C in the last 72 hours. Lipid Profile No results for input(s): CHOL, HDL, LDLCALC, TRIG, CHOLHDL, LDLDIRECT in the last 72 hours. Thyroid function studies No results for input(s): TSH, T4TOTAL, T3FREE, THYROIDAB in the last 72 hours.  Invalid input(s): FREET3 Anemia work up No results for input(s): VITAMINB12, FOLATE, FERRITIN, TIBC, IRON, RETICCTPCT in the last 72 hours. Urinalysis    Component Value Date/Time   COLORURINE YELLOW 02/16/2020 0754   APPEARANCEUR CLEAR 02/16/2020 0754   LABSPEC 1.031 (H) 02/16/2020 0754   PHURINE 5.0 02/16/2020 0754   GLUCOSEU NEGATIVE 02/16/2020 0754   HGBUR SMALL (A) 02/16/2020 0754   BILIRUBINUR NEGATIVE 02/16/2020 0754   KETONESUR NEGATIVE 02/16/2020 0754   PROTEINUR 30 (A) 02/16/2020 0754   NITRITE NEGATIVE 02/16/2020 0754   LEUKOCYTESUR NEGATIVE 02/16/2020 0754   Sepsis Labs Invalid input(s): PROCALCITONIN,  WBC,  LACTICIDVEN Microbiology Recent Results (from the past 240 hour(s))  Resp Panel by RT-PCR (Flu A&B, Covid) Nasopharyngeal Swab     Status: None   Collection Time: 08/15/20 12:33 PM   Specimen:  Nasopharyngeal Swab; Nasopharyngeal(NP) swabs in vial transport medium  Result Value Ref Range Status   SARS Coronavirus 2 by RT PCR NEGATIVE NEGATIVE Final    Comment: (NOTE) SARS-CoV-2 target nucleic acids are NOT DETECTED.  The SARS-CoV-2 RNA is generally detectable in upper respiratory specimens during the acute phase of infection. The lowest concentration of SARS-CoV-2 viral copies this assay can detect is 138 copies/mL. A negative result does not preclude SARS-Cov-2 infection and should not be used as the sole basis for treatment or other patient management decisions. A negative result may occur with  improper specimen collection/handling, submission of specimen other than nasopharyngeal swab, presence of viral mutation(s) within the areas targeted by this assay, and inadequate number of viral copies(<138 copies/mL). A negative result must be combined with clinical observations, patient history, and epidemiological information. The expected result is Negative.  Fact Sheet for Patients:  BloggerCourse.com  Fact Sheet for Healthcare Providers:  [employer]  This test is no t yet approved or cleared by the [place] FDA and  has been authorized for detection and/or diagnosis of SARS-CoV-2 by FDA under an Emergency Use Authorization (EUA). This EUA will remain  in effect (  meaning this test can be used) for the duration of the COVID-19 declaration under Section 564(b)(1) of the Act, 21 U.S.C.section 360bbb-3(b)(1), unless the authorization is terminated  or revoked sooner.       Influenza A by PCR NEGATIVE NEGATIVE Final   Influenza B by PCR NEGATIVE NEGATIVE Final    Comment: (NOTE) The Xpert Xpress SARS-CoV-2/FLU/RSV plus assay is intended as an aid in the diagnosis of influenza from Nasopharyngeal swab specimens and should not be used as a sole basis for treatment. Nasal washings and aspirates are unacceptable for  Xpert Xpress SARS-CoV-2/FLU/RSV testing.  Fact Sheet for Patients: BloggerCourse.com  Fact Sheet for Healthcare Providers: [employer]iousBroker.it  This test is not yet approved or cleared by the [place] FDA and has been authorized for detection and/or diagnosis of SARS-CoV-2 by FDA under an Emergency Use Authorization (EUA). This EUA will remain in effect (meaning this test can be used) for the duration of the COVID-19 declaration under Section 564(b)(1) of the Act, 21 U.S.C. section 360bbb-3(b)(1), unless the authorization is terminated or revoked.  Performed at [facility], [address], [place], [place] [address]      Time coordinating discharge: Over 30 minutes  SIGNED:   Alvira Philips [place], DO  [facility] 08/16/2020, 2:04 PM

## 2020-08-16 NOTE — Progress Notes (Signed)
PROGRESS NOTE    Donald Murray  PJA:[id number] DOB: [birth year] DOA: 08/15/2020 PCP: Patient, [name]    Brief Narrative:  Donald Murray is a 35 year old male with past medical history significant for chronic systolic congestive heart failure )EF 20-25%), essential hypertension, distant history IVDU, morbid obesity who presented to the ED with 2-week history of progressive shortness of breath on exertion with orthopnea.  Unable to sleep at night due to inability to breathe.  Patient reports compliance with his medications generally, but endorses occasion to forget to take his medications, sometimes up to 3 times weekly.  Patient reports he eats fast food regularly that is typically high in salt content.  Reports nonproductive cough.  Denies fever/chills/night sweats, no lower extremity swelling, no reported weight gain, no chest pain, no nausea/vomiting.  In the ED, temperature 98.6 F, HR 116, RR 36, BP 145/107, SPO2 97% on room air.  Patient did desaturate to 77% on room air on ambulation in the ED.  Sodium 139, potassium 4.1, chloride 110, CO2 21, glucose 99, BUN 21, creatinine 1.03.  WBC 11.5, hemoglobin 13.2, platelets 313.  BNP 891.6.  Digoxin less than 0.2.  Covid-19/influenza A/B PCR negative.  Chest x-ray with mild vascular congestion.  CT angiogram chest negative for PE but with subtle nodular infiltrates right upper lobe/right middle lobe.  Cardiology was consulted.  Hospital service consulted for further evaluation and management.  Patient transferred from [place] to [facility] for advanced heart failure team evaluation.   Assessment & Plan:   Principal Problem:   Acute CHF (congestive heart failure) (HCC) Active Problems:   Essential hypertension   PVC (premature ventricular contraction)   Acute on chronic combined systolic/diastolic congestive heart failure Patient presenting to the ED with progressive shortness of breath associated with orthopnea.  Suspect etiology poor  medication compliance and dietary indiscretions as patient reports occasional noncompliance with his home medications and fast food use with high salt content.  Patient was noted to have an elevated BNP and vascular congestion on chest x-ray.  No pulmonary embolism on CT angiogram chest.  TTE with LVEF 20-25%, LV with severely decreased function but no regional wall motion abnormalities, grade 3 diastolic dysfunction, RV severely dilated, LA/RA severely dilated, normal IVC. --Cardiology consulted --net negative 3.8L since admission --wt 143>141.4kg --Furosemide 60 mg IV q12h --Continue carvedilol, digoxin, Entresto --Strict I's and O's and daily weights --Nutrition consult for diet education  Essential hypertension --Carvedilol 6.25 mg p.o. twice daily  --Entresto 97-103 mg p.o. twice daily  Tobacco use disorder Continues to endorse half pack per day and vaping.  Counseled on need for complete cessation.  History of IVDA with heroin Maintains abstinence, last reported use December 2020  Morbid obesity Body mass index is 43.49 kg/m.  Discussed with patient needs for aggressive lifestyle changes/weight loss as this complicates all facets of care.  Outpatient follow-up with PCP.  May benefit from bariatric evaluation outpatient and sleep study    DVT prophylaxis: Lovenox   Code Status: Full Code Family Communication: Updated patient extensively at bedside, no family present this morning  Disposition Plan:  Level of care: Telemetry Cardiac Status is: Inpatient  Remains inpatient appropriate because:Ongoing diagnostic testing needed not appropriate for outpatient work up, Unsafe d/c plan, IV treatments appropriate due to intensity of illness or inability to take PO and Inpatient level of care appropriate due to severity of illness   Dispo: The patient is from: Home  Anticipated d/c is to: Home              Patient currently is not medically stable to d/c.   Difficult to  place patient No   Consultants:   Cardiology  Procedures:   TTE  Antimicrobials:   None   Subjective: Patient seen and examined at bedside, resting comfortably.  Sleeping but easily arousable.  States breathing is slightly improved, reports good urine output overnight.  No other specific complaints at this time.  Denies headache, no chest pain, no palpitations, no abdominal pain, no weakness, no fatigue, no paresthesias.  No acute events overnight per nurse staff.  Objective: Vitals:   08/16/20 0001 08/16/20 0518 08/16/20 0800 08/16/20 1037  BP: 98/72 94/61 108/70 (!) 134/96  Pulse: 98 89 97 96  Resp: 20 18 20    Temp: 97.7 F (36.5 C) 97.6 F (36.4 C) 98.4 F (36.9 C)   TempSrc: Oral Oral Oral   SpO2: 97% 98% 96%   Weight:  (!) 141.4 kg    Height:        Intake/Output Summary (Last 24 hours) at 08/16/2020 1133 Last data filed at 08/16/2020 1000 Gross per 24 hour  Intake 480 ml  Output 4900 ml  Net -4420 ml   Filed Weights   08/15/20 0825 08/15/20 1732 08/16/20 0518  Weight: (!) 138.4 kg (!) 143.3 kg (!) 141.4 kg    Examination:  General exam: Appears calm and comfortable  Respiratory system: Clear to auscultation. Respiratory effort normal.  On room air at rest Cardiovascular system: S1 & S2 heard, RRR. No JVD, murmurs, rubs, gallops or clicks. No pedal edema. Gastrointestinal system: Abdomen is nondistended, soft and nontender. No organomegaly or masses felt. Normal bowel sounds heard. Central nervous system: Alert and oriented. No focal neurological deficits. Extremities: Symmetric 5 x 5 power. Skin: No rashes, lesions or ulcers Psychiatry: Judgement and insight appear normal. Mood & affect appropriate.     Data Reviewed: I have personally reviewed following labs and imaging studies  CBC: Recent Labs  Lab 08/15/20 0858 08/16/20 0447  WBC 11.5* 11.1*  HGB 13.2 15.7  HCT 42.8 50.3  MCV 80.3 80.0  PLT 313 338   Basic Metabolic Panel: Recent Labs   Lab 08/15/20 0858 08/16/20 0447  NA 139 139  K 4.1 3.7  CL 110 102  CO2 21* 26  GLUCOSE 99 83  BUN 21* 18  CREATININE 1.03 1.22  CALCIUM 8.5* 9.0   GFR: Estimated Creatinine Clearance: 122.7 mL/min (by C-G formula based on SCr of 1.22 mg/dL). Liver Function Tests: No results for input(s): AST, ALT, ALKPHOS, BILITOT, PROT, ALBUMIN in the last 168 hours. No results for input(s): LIPASE, AMYLASE in the last 168 hours. No results for input(s): AMMONIA in the last 168 hours. Coagulation Profile: No results for input(s): INR, PROTIME in the last 168 hours. Cardiac Enzymes: No results for input(s): CKTOTAL, CKMB, CKMBINDEX, TROPONINI in the last 168 hours. BNP (last 3 results) No results for input(s): PROBNP in the last 8760 hours. HbA1C: No results for input(s): HGBA1C in the last 72 hours. CBG: Recent Labs  Lab 08/16/20 0604  GLUCAP 95   Lipid Profile: No results for input(s): CHOL, HDL, LDLCALC, TRIG, CHOLHDL, LDLDIRECT in the last 72 hours. Thyroid Function Tests: No results for input(s): TSH, T4TOTAL, FREET4, T3FREE, THYROIDAB in the last 72 hours. Anemia Panel: No results for input(s): VITAMINB12, FOLATE, FERRITIN, TIBC, IRON, RETICCTPCT in the last 72 hours. Sepsis Labs: No results for input(s):  PROCALCITON, LATICACIDVEN in the last 168 hours.  Recent Results (from the past 240 hour(s))  Resp Panel by RT-PCR (Flu A&B, Covid) Nasopharyngeal Swab     Status: None   Collection Time: 08/15/20 12:33 PM   Specimen: Nasopharyngeal Swab; Nasopharyngeal(NP) swabs in vial transport medium  Result Value Ref Range Status   SARS Coronavirus 2 by RT PCR NEGATIVE NEGATIVE Final    Comment: (NOTE) SARS-CoV-2 target nucleic acids are NOT DETECTED.  The SARS-CoV-2 RNA is generally detectable in upper respiratory specimens during the acute phase of infection. The lowest concentration of SARS-CoV-2 viral copies this assay can detect is 138 copies/mL. A negative result does not  preclude SARS-Cov-2 infection and should not be used as the sole basis for treatment or other patient management decisions. A negative result may occur with  improper specimen collection/handling, submission of specimen other than nasopharyngeal swab, presence of viral mutation(s) within the areas targeted by this assay, and inadequate number of viral copies(<138 copies/mL). A negative result must be combined with clinical observations, patient history, and epidemiological information. The expected result is Negative.  Fact Sheet for Patients:  BloggerCourse.com  Fact Sheet for Healthcare Providers:  SeriousBroker.it  This test is no t yet approved or cleared by the [place] FDA and  has been authorized for detection and/or diagnosis of SARS-CoV-2 by FDA under an Emergency Use Authorization (EUA). This EUA will remain  in effect (meaning this test can be used) for the duration of the COVID-19 declaration under Section 564(b)(1) of the Act, 21 U.S.C.section 360bbb-3(b)(1), unless the authorization is terminated  or revoked sooner.       Influenza A by PCR NEGATIVE NEGATIVE Final   Influenza B by PCR NEGATIVE NEGATIVE Final    Comment: (NOTE) The Xpert Xpress SARS-CoV-2/FLU/RSV plus assay is intended as an aid in the diagnosis of influenza from Nasopharyngeal swab specimens and should not be used as a sole basis for treatment. Nasal washings and aspirates are unacceptable for Xpert Xpress SARS-CoV-2/FLU/RSV testing.  Fact Sheet for Patients: BloggerCourse.com  Fact Sheet for Healthcare Providers: SeriousBroker.it  This test is not yet approved or cleared by the [place] FDA and has been authorized for detection and/or diagnosis of SARS-CoV-2 by FDA under an Emergency Use Authorization (EUA). This EUA will remain in effect (meaning this test can be used) for the  duration of the COVID-19 declaration under Section 564(b)(1) of the Act, 21 U.S.C. section 360bbb-3(b)(1), unless the authorization is terminated or revoked.  Performed at [facility], [address] [place], [place] [address]          Radiology Studies: DG Chest 2 View  Result Date: 08/15/2020 CLINICAL DATA:  Shortness of breath for 2 weeks EXAM: CHEST - 2 VIEW COMPARISON:  02/16/2020 FINDINGS: Cardiac shadow is enlarged but stable. Mild vascular congestion remains. Lungs are well aerated bilaterally. No focal infiltrate or sizable effusion is seen. No acute bony abnormality is noted. IMPRESSION: Mild vascular congestion stable from the prior exam. No acute bony abnormality noted. Electronically Signed   By: Alcide Clever M.D.   On: 08/15/2020 08:59   CT Angio Chest PE W and/or Wo Contrast  Result Date: 08/15/2020 CLINICAL DATA:  Dyspnea, congestive heart failure EXAM: CT ANGIOGRAPHY CHEST WITH CONTRAST TECHNIQUE: Multidetector CT imaging of the chest was performed using the standard protocol during bolus administration of intravenous contrast. Multiplanar CT image reconstructions and MIPs were obtained to evaluate the vascular anatomy. CONTRAST:  53mL OMNIPAQUE IOHEXOL 350 MG/ML  SOLN COMPARISON:  None. FINDINGS: Cardiovascular: There is adequate opacification of the central pulmonary arteries. Apparent filling defect within the a superior segmental pulmonary artery of the right lower lobe is artifactual and related to respiratory motion artifact. There is no true intraluminal filling defect identified to suggest acute pulmonary embolism. The central pulmonary arteries are of normal caliber. No significant coronary artery calcification. Mild cardiomegaly with mild left ventricular dilation. No pericardial effusion. The thoracic aorta is unremarkable. Mediastinum/Nodes: No enlarged mediastinal, hilar, or axillary lymph nodes. Thyroid gland, trachea, and esophagus demonstrate  no significant findings. Lungs/Pleura: Scattered nodular infiltrates are seen within the right upper lobe and right middle lobe, possibly infectious in the appropriate clinical setting. Mild bibasilar atelectasis. The lungs are otherwise clear. No pneumothorax or pleural effusion. The central airways are widely patent. Upper Abdomen: No acute abnormality within the visualized upper abdomen. Musculoskeletal: No acute bone abnormality. Review of the MIP images confirms the above findings. IMPRESSION: No pulmonary embolism. Mild cardiomegaly with mild left ventricular dilation. Scattered nodular infiltrate within the right upper lobe and right middle lobe, possibly infectious in the appropriate clinical setting. Electronically Signed   By: Helyn Numbers MD   On: 08/15/2020 11:38   ECHOCARDIOGRAM COMPLETE  Result Date: 08/15/2020    ECHOCARDIOGRAM REPORT   Patient Name:   DONAVYN FECHER Date of Exam: 08/15/2020 Medical Rec #:  [id number]     Height:       71.0 in Accession #:    [id number]    Weight:       305.1 lb Date of Birth:  11-21-1985    BSA:          2.523 m Patient Age:    34 years      BP:           107/91 mmHg Patient Gender: M             HR:           50 bpm. Exam Location:  Inpatient Procedure: 2D Echo, Pediatric Echo, Cardiac Doppler and Intracardiac            Opacification Agent Indications:    CHF  History:        Patient has prior history of Echocardiogram examinations, most                 recent 02/16/2020. CHF, Signs/Symptoms:Shortness of Breath; Risk                 Factors:Hypertension and Obesity.  Sonographer:    Lavenia Atlas Referring Phys: [id number] JARED E SEGAL IMPRESSIONS  1. Severe biventricular systolic dysfunction, restrictive pattern of diastolic dysfunction.  2. Left ventricular ejection fraction, by estimation, is 20 to 25%. The left ventricle has severely decreased function. The left ventricle has no regional wall motion abnormalities. The left ventricular internal cavity size  was moderately dilated. There  is moderate concentric left ventricular hypertrophy. Left ventricular diastolic parameters are consistent with Grade III diastolic dysfunction (restrictive). Elevated left atrial pressure.  3. Right ventricular systolic function is severely reduced. The right ventricular size is moderately enlarged. There is normal pulmonary artery systolic pressure. The estimated right ventricular systolic pressure is 21.7 mmHg.  4. Left atrial size was severely dilated.  5. Right atrial size was mildly dilated.  6. The mitral valve is normal in structure. Mild mitral valve regurgitation. No evidence of mitral stenosis.  7. The aortic valve is normal in structure. Aortic valve regurgitation is not visualized. No  aortic stenosis is present.  8. The inferior vena cava is normal in size with greater than 50% respiratory variability, suggesting right atrial pressure of 3 mmHg. Comparison(s): No significant change from prior study. FINDINGS  Left Ventricle: Left ventricular ejection fraction, by estimation, is 20 to 25%. The left ventricle has severely decreased function. The left ventricle has no regional wall motion abnormalities. Definity contrast agent was given IV to delineate the left  ventricular endocardial borders. The left ventricular internal cavity size was moderately dilated. There is moderate concentric left ventricular hypertrophy. Left ventricular diastolic parameters are consistent with Grade III diastolic dysfunction (restrictive). Elevated left atrial pressure. Right Ventricle: The right ventricular size is moderately enlarged. No increase in right ventricular wall thickness. Right ventricular systolic function is severely reduced. There is normal pulmonary artery systolic pressure. The tricuspid regurgitant velocity is 2.16 m/s, and with an assumed right atrial pressure of 3 mmHg, the estimated right ventricular systolic pressure is 21.7 mmHg. Left Atrium: Left atrial size was severely  dilated. Right Atrium: Right atrial size was mildly dilated. Pericardium: There is no evidence of pericardial effusion. Mitral Valve: The mitral valve is normal in structure. Mild mitral valve regurgitation. No evidence of mitral valve stenosis. Tricuspid Valve: The tricuspid valve is normal in structure. Tricuspid valve regurgitation is mild . No evidence of tricuspid stenosis. Aortic Valve: The aortic valve is normal in structure. Aortic valve regurgitation is not visualized. No aortic stenosis is present. Pulmonic Valve: The pulmonic valve was normal in structure. Pulmonic valve regurgitation is not visualized. No evidence of pulmonic stenosis. Aorta: The aortic root is normal in size and structure. Venous: The inferior vena cava is normal in size with greater than 50% respiratory variability, suggesting right atrial pressure of 3 mmHg. IAS/Shunts: No atrial level shunt detected by color flow Doppler.  LEFT VENTRICLE PLAX 2D LVIDd:         6.32 cm  Diastology LVIDs:         5.78 cm  LV e' medial:    6.31 cm/s LV PW:         1.26 cm  LV E/e' medial:  15.5 LV IVS:        1.29 cm  LV e' lateral:   11.50 cm/s LVOT diam:     2.80 cm  LV E/e' lateral: 8.5 LV SV:         57 LV SV Index:   22 LVOT Area:     6.16 cm  RIGHT VENTRICLE RV Basal diam:  3.96 cm RV S prime:     8.49 cm/s TAPSE (M-mode): 3.5 cm LEFT ATRIUM         Index LA diam:    5.70 cm 2.26 cm/m  AORTIC VALVE LVOT Vmax:   64.30 cm/s LVOT Vmean:  42.400 cm/s LVOT VTI:    0.092 m  AORTA Ao Root diam: 3.40 cm MITRAL VALVE               TRICUSPID VALVE MV Area (PHT): 10.84 cm   TR Peak grad:   18.7 mmHg MV Decel Time: 70 msec     TR Vmax:        216.00 cm/s MV E velocity: 98.00 cm/s MV A velocity: 31.50 cm/s  SHUNTS MV E/A ratio:  3.11        Systemic VTI:  0.09 m  Systemic Diam: 2.80 cm Tobias Alexander MD Electronically signed by Tobias Alexander MD Signature Date/Time: 08/15/2020/3:59:35 PM    Final         Scheduled Meds: .  carvedilol  6.25 mg Oral BID WC  . digoxin  0.125 mg Oral Daily  . enoxaparin (LOVENOX) injection  70 mg Subcutaneous Q24H  . furosemide  60 mg Intravenous BID  . sacubitril-valsartan  1 tablet Oral BID  . sodium chloride flush  3 mL Intravenous Q12H   Continuous Infusions:   LOS: 1 day    Time spent: 39 minutes spent on chart review, discussion with nursing staff, consultants, updating family and interview/physical exam; more than 50% of that time was spent in counseling and/or coordination of care.    Alvira Philips [place], DO [facility] Available via Epic secure chat 7am-7pm After these hours, please refer to coverage provider listed on [email or web address] 08/16/2020, 11:33 AM

## 2020-08-16 NOTE — Plan of Care (Signed)
Nutrition Education Note  RD consulted for nutrition education regarding CHF.  Spoke with pt over the phone, who reports feeling better today. 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 Pt reports that he is "always on the go" and often forgets to take medications, monitor weight, and does not monitor his diet well. Frequently consumed foods include McDouble and fries OR steak, loaded baked potato, and salad. He usually drinks sweet teat, gatorade, soda, and water.   Pt reports feeling very overwhelmed by his diagnosis. He shares he has never had a chronic illness before. Provided emotional support. Education was focused on how to choose healthier options at restaurants.   RD provided "Low Sodium Nutrition Therapy" handout from the Academy of [employer] and Dietetics. Reviewed patient's dietary recall. Provided examples on ways to decrease sodium intake in diet. Discouraged intake of processed foods and use of salt shaker. Encouraged fresh fruits and vegetables as well as whole grain sources of carbohydrates to maximize fiber intake.   RD discussed why it is important for patient to adhere to diet recommendations, and emphasized the role of fluids, foods to avoid, and importance of weighing self daily. Teach back method used.  Expect fair compliance.  Body mass index is 43.49 kg/m. Pt meets criteria for extreme obesity, class III based on current BMI.  Current diet order is 2 gram sodium, patient is consuming approximately 100% of meals at this time. Labs and medications reviewed. No further nutrition interventions warranted at this time. RD contact information provided. If additional nutrition issues arise, please re-consult RD.   Levada Schilling, RD, LDN, CDCES Registered Dietitian II Certified Diabetes Care and Education Specialist Please refer to [facility] for RD and/or RD  on-call/weekend/after hours pager

## 2020-08-16 NOTE — Consult Note (Addendum)
Advanced Heart Failure Team Consult Note   Primary Physician:none  Primary Cardiologist: Bard Herbert  Reason for Consultation: acute on chronic systolic CHF  HPI:    Donald Murray is seen today for evaluation of acute on chronic systolic CHF at the request of Dr. [place].   Donald Murray a morbidly obese 35 y.o.malewith HTN, IVDA (heroin), tobacco use and systolic HF (onset 9/21) referred by Dr. Jens Som for further management of his HF.   Echo 10/20 EF 60-65%   Admitted to [facility] 9/21 with new onset HF in setting of severe HTN 174/128.Marland Kitchen ECG with sinus tach and frequent PVCs.   Echo 02/16/20: EF 20-25% Moderate RV dysfunction.   I saw him in HF Consultation for the first time 04/24/20. 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 Lasix and Entresto increased. Digoxin added. Zio placed to quantify PVC burden  Last visit 04/2020 in AHF was doing okay with some improvement in symptoms. 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  No showed February appointment  Zio 12/21 Sinus - average HR 104 4.3% PVCs  Admitted 08/15/20 with worsening dyspnea and orthopnea over the last two weeks as well as PND. Main reason he came in was due to poor sleep.  He admits to poor medication and dietary adherence. He denies any chest pain episodes. His initial workup was consistent with acute CHF elevated BNP, edema on chest imaging.  No PE on CTA, ecg neg for acute ischemia but with PVC's.    Said he was able to sleep last night for the first time in 2 weeks.  He denies any chest pain, orthopnea has improved.  Improvement in dyspnea on exertion but still dyspneic. He has been having sweating episodes over the last year but never any chest pain.     Review of Systems: [y] = yes, [ ]  = no   General: Weight gain ]; Weight loss [ ] ; Anorexia [ ] ; Fatigue [ ] ; Fever [ ] ; Chills [ ] ; Weakness [ ]   Cardiac: Chest pain/pressure [ ] ; Resting SOB [ ] ; Exertional SOB [ y]; Orthopnea [place] ];  Pedal Edema ]; Palpitations [ ] ; Syncope [ ] ; Presyncope [ ] ; Paroxysmal nocturnal dyspnea[ ]   Pulmonary: Cough [ ] ; Wheezing[ ] ; Hemoptysis[ ] ; Sputum [ ] ; Snoring [ ]   GI: Vomiting[ ] ; Dysphagia[ ] ; Melena[ ] ; Hematochezia [ ] ; Heartburn[ ] ; Abdominal pain [ ] ; Constipation [ ] ; Diarrhea [ ] ; BRBPR [ ]   GU: Hematuria[ ] ; Dysuria [ ] ; Nocturia[ ]   Vascular: Pain in legs with walking [ ] ; Pain in feet with lying flat [ ] ; Non-healing sores [ ] ; Stroke [ ] ; TIA [ ] ; Slurred speech [ ] ;  Neuro: Headaches[ ] ; Vertigo[ ] ; Seizures[ ] ; Paresthesias[ ] ;Blurred vision [ ] ; Diplopia [ ] ; Vision changes [ ]   Ortho/Skin: Arthritis [ ] ; Joint pain [ ] ; Muscle pain [ ] ; Joint swelling [ ] ; Back Pain [ ] ; Rash [ ]   Psych: Depression[ ] ; Anxiety[ ]   Heme: Bleeding problems [ ] ; Clotting disorders [ ] ; Anemia [ ]   Endocrine: Diabetes [ ] ; Thyroid dysfunction[ ]   Home Medications Prior to Admission medications   Medication Sig Start Date End Date Taking? Authorizing Provider  carvedilol (COREG) 6.25 MG tablet Take 1 tablet (6.25 mg total) by mouth 2 (two) times daily with a meal. 05/23/20  Yes Bensimhon, , MD  digoxin (LANOXIN) 0.125 MG tablet Take 1 tablet (0.125 mg total) by mouth daily. 04/24/20  Yes Bensimhon, , MD  furosemide (LASIX) 20 MG tablet Take 3 tablets (60 mg total) by mouth daily. Take additional 20 mg daily for edema or weight gain of 3 lbs in 1 day or 5 lbs in 2 days. 05/06/20  Yes Lewayne Buntingrenshaw, Brian S, MD  sacubitril-valsartan (ENTRESTO) 97-103 MG Take 1 tablet by mouth 2 (two) times daily. 05/16/20  Yes Bensimhon, Bevelyn Bucklesaniel R, MD  spironolactone (ALDACTONE) 25 MG tablet Take 0.5 tablets (12.5 mg total) by mouth daily. 03/06/20 06/04/20 Yes Lewayne Buntingrenshaw, Brian S, MD    Past Medical History: Past Medical History:  Diagnosis Date  . CHF (congestive heart failure) (HCC)   . Hypertension   . Obesity   . Snoring     Past Surgical History: Past Surgical History:  Procedure  Laterality Date  . I & D EXTREMITY Right 03/04/2019   Procedure: IRRIGATION AND DEBRIDEMENT OF RIGHT ELBOW;  Surgeon: Dominica SeverinGramig, William, MD;  Location: [facility] OR;  Service: Orthopedics;  Laterality: Right;  . IRRIGATION AND DEBRIDEMENT ABSCESS Left 03/04/2019   Procedure: Irrigation And Debridement Abscess Left hand;  Surgeon: Dominica SeverinGramig, William, MD;  [facility];  Service: Orthopedics;  Laterality: Left;  . UMBILICAL HERNIA REPAIR      Family History: Family History  Problem Relation Age of Onset  . Hypertension Mother   . Hypertension Father     Social History: Social History   Socioeconomic History  . Marital status: Single    Spouse name: Not on file  . Number of children: Not on file  . Years of education: Not on file  . Highest education level: Not on file  Occupational History  . Not on file  Tobacco Use  . Smoking status: Current Every Day Smoker    Packs/day: 1.00    Years: 16.00    Pack years: 16.00    Types: Cigarettes  . Smokeless tobacco: Never Used  Vaping Use  . Vaping Use: Some days  . Substances: Nicotine  Substance and Sexual Activity  . 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 Drug use: Not Currently    Comment: Pt stated "It was opiates"  . Sexual activity: Not on file  Other Topics Concern  . Not on file  Social History Narrative  . Not on file   Social Determinants of Health   Financial Resource Strain: Not on file  Food Insecurity: Not on file  Transportation Needs: Not on file  Physical Activity: Not on file  Stress: Not on file  Social Connections: Not on file    Allergies:  No Known Allergies  Objective:    Vital Signs:   Temp:  [97.6 F (36.4 C)-98.4 F (36.9 C)] 98.4 F (36.9 C) (03/25 0800) Pulse Rate:  [50-115] 96 (03/25 1037) Resp:  [18-35] 20 (03/25 0800) BP: (94-134)/(61-105) 134/96 (03/25 1037) SpO2:  [90 %-98 %] 96 % (03/25 0800) FiO2 (%):  [21 %] 21 % (03/24 1308) Weight:  [141.4 kg-143.3  kg] 141.4 kg (03/25 0518) Last BM Date: 08/15/20  Weight change: Filed Weights   08/15/20 0825 08/15/20 1732 08/16/20 0518  Weight: (!) 138.4 kg (!) 143.3 kg (!) 141.4 kg    Intake/Output:   Intake/Output Summary (Last 24 hours) at 08/16/2020 1046 Last data filed at 08/16/2020 1000 Gross per 24 hour  Intake 480 ml  Output 4900 ml  Net -4420 ml      Physical Exam    General:  Well appearing. No resp difficulty HEENT: normal Neck: supple. JVP  7 . Carotids 2+ bilat; no bruits. No lymphadenopathy or thyromegaly appreciated. Cor: PMI nondisplaced. Regular rate & rhythm. No rubs, gallops or murmurs. Lungs: clear Abdomen: Obese, soft, nontender, nondistended. No hepatosplenomegaly. No bruits or masses. Good bowel sounds. Extremities: no cyanosis, clubbing, rash, Trace edema bilaterally Neuro: alert & orientedx3, cranial nerves grossly intact. moves all 4 extremities w/o difficulty. Affect pleasant   Telemetry   NSR 80's-90's PVC burden <5%  EKG    Sinus tach 118.  3 PVC's, biatrial enlargement   Labs   Basic Metabolic Panel: Recent Labs  Lab 08/15/20 0858 08/16/20 0447  NA 139 139  K 4.1 3.7  CL 110 102  CO2 21* 26  GLUCOSE 99 83  BUN 21* 18  CREATININE 1.03 1.22  CALCIUM 8.5* 9.0    Liver Function Tests: No results for input(s): AST, ALT, ALKPHOS, BILITOT, PROT, ALBUMIN in the last 168 hours. No results for input(s): LIPASE, AMYLASE in the last 168 hours. No results for input(s): AMMONIA in the last 168 hours.  CBC: Recent Labs  Lab 08/15/20 0858 08/16/20 0447  WBC 11.5* 11.1*  HGB 13.2 15.7  HCT 42.8 50.3  MCV 80.3 80.0  PLT 313 338    Cardiac Enzymes: No results for input(s): CKTOTAL, CKMB, CKMBINDEX, TROPONINI in the last 168 hours.  BNP: BNP (last 3 results) Recent Labs    04/24/20 1337 05/16/20 1103 08/15/20 0858  BNP 680.4* 258.6* 891.6*    ProBNP (last 3 results) No results for input(s): PROBNP in the last 8760  hours.   CBG: Recent Labs  Lab 08/16/20 0604  GLUCAP 95    Coagulation Studies: No results for input(s): LABPROT, INR in the last 72 hours.   Imaging   CT Angio Chest PE W and/or Wo Contrast  Result Date: 08/15/2020 CLINICAL DATA:  Dyspnea, congestive heart failure EXAM: CT ANGIOGRAPHY CHEST WITH CONTRAST TECHNIQUE: Multidetector CT imaging of the chest was performed using the standard protocol during bolus administration of intravenous contrast. Multiplanar CT image reconstructions and MIPs were obtained to evaluate the vascular anatomy. CONTRAST:  11mL OMNIPAQUE IOHEXOL 350 MG/ML SOLN COMPARISON:  None. FINDINGS: Cardiovascular: There is adequate opacification of the central pulmonary arteries. Apparent filling defect within the a superior segmental pulmonary artery of the right lower lobe is artifactual and related to respiratory motion artifact. There is no true intraluminal filling defect identified to suggest acute pulmonary embolism. The central pulmonary arteries are of normal caliber. No significant coronary artery calcification. Mild cardiomegaly with mild left ventricular dilation. No pericardial effusion. The thoracic aorta is unremarkable. Mediastinum/Nodes: No enlarged mediastinal, hilar, or axillary lymph nodes. Thyroid gland, trachea, and esophagus demonstrate no significant findings. Lungs/Pleura: Scattered nodular infiltrates are seen within the right upper lobe and right middle lobe, possibly infectious in the appropriate clinical setting. Mild bibasilar atelectasis. The lungs are otherwise clear. No pneumothorax or pleural effusion. The central airways are widely patent. Upper Abdomen: No acute abnormality within the visualized upper abdomen. Musculoskeletal: No acute bone abnormality. Review of the MIP images confirms the above findings. IMPRESSION: No pulmonary embolism. Mild cardiomegaly with mild left ventricular dilation. Scattered nodular infiltrate within the right upper  lobe and right middle lobe, possibly infectious in the appropriate clinical setting. Electronically Signed   By: Helyn Numbers MD   On: 08/15/2020 11:38   ECHOCARDIOGRAM COMPLETE  Result Date: 08/15/2020    ECHOCARDIOGRAM REPORT   Patient Name:   Donald Murray Date of Exam: 08/15/2020 Medical Rec #:  [id number]  Height:       71.0 in Accession #:    [id number]    Weight:       305.1 lb Date of Birth:  1985-12-04    BSA:          2.523 m Patient Age:    34 years      BP:           107/91 mmHg Patient Gender: M             HR:           50 bpm. Exam Location:  Inpatient Procedure: 2D Echo, Pediatric Echo, Cardiac Doppler and Intracardiac            Opacification Agent Indications:    CHF  History:        Patient has prior history of Echocardiogram examinations, most                 recent 02/16/2020. CHF, Signs/Symptoms:Shortness of Breath; Risk                 Factors:Hypertension and Obesity.  Sonographer:    Lavenia Atlas Referring Phys: [id number] JARED E SEGAL IMPRESSIONS  1. Severe biventricular systolic dysfunction, restrictive pattern of diastolic dysfunction.  2. Left ventricular ejection fraction, by estimation, is 20 to 25%. The left ventricle has severely decreased function. The left ventricle has no regional wall motion abnormalities. The left ventricular internal cavity size was moderately dilated. There  is moderate concentric left ventricular hypertrophy. Left ventricular diastolic parameters are consistent with Grade III diastolic dysfunction (restrictive). Elevated left atrial pressure.  3. Right ventricular systolic function is severely reduced. The right ventricular size is moderately enlarged. There is normal pulmonary artery systolic pressure. The estimated right ventricular systolic pressure is 21.7 mmHg.  4. Left atrial size was severely dilated.  5. Right atrial size was mildly dilated.  6. The mitral valve is normal in structure. Mild mitral valve regurgitation. No evidence of mitral  stenosis.  7. The aortic valve is normal in structure. Aortic valve regurgitation is not visualized. No aortic stenosis is present.  8. The inferior vena cava is normal in size with greater than 50% respiratory variability, suggesting right atrial pressure of 3 mmHg. Comparison(s): No significant change from prior study. FINDINGS  Left Ventricle: Left ventricular ejection fraction, by estimation, is 20 to 25%. The left ventricle has severely decreased function. The left ventricle has no regional wall motion abnormalities. Definity contrast agent was given IV to delineate the left  ventricular endocardial borders. The left ventricular internal cavity size was moderately dilated. There is moderate concentric left ventricular hypertrophy. Left ventricular diastolic parameters are consistent with Grade III diastolic dysfunction (restrictive). Elevated left atrial pressure. Right Ventricle: The right ventricular size is moderately enlarged. No increase in right ventricular wall thickness. Right ventricular systolic function is severely reduced. There is normal pulmonary artery systolic pressure. The tricuspid regurgitant velocity is 2.16 m/s, and with an assumed right atrial pressure of 3 mmHg, the estimated right ventricular systolic pressure is 21.7 mmHg. Left Atrium: Left atrial size was severely dilated. Right Atrium: Right atrial size was mildly dilated. Pericardium: There is no evidence of pericardial effusion. Mitral Valve: The mitral valve is normal in structure. Mild mitral valve regurgitation. No evidence of mitral valve stenosis. Tricuspid Valve: The tricuspid valve is normal in structure. Tricuspid valve regurgitation is mild . No evidence of tricuspid stenosis. Aortic Valve: The aortic valve is normal in structure. Aortic valve regurgitation  is not visualized. No aortic stenosis is present. Pulmonic Valve: The pulmonic valve was normal in structure. Pulmonic valve regurgitation is not visualized. No evidence  of pulmonic stenosis. Aorta: The aortic root is normal in size and structure. Venous: The inferior vena cava is normal in size with greater than 50% respiratory variability, suggesting right atrial pressure of 3 mmHg. IAS/Shunts: No atrial level shunt detected by color flow Doppler.  LEFT VENTRICLE PLAX 2D LVIDd:         6.32 cm  Diastology LVIDs:         5.78 cm  LV e' medial:    6.31 cm/s LV PW:         1.26 cm  LV E/e' medial:  15.5 LV IVS:        1.29 cm  LV e' lateral:   11.50 cm/s LVOT diam:     2.80 cm  LV E/e' lateral: 8.5 LV SV:         57 LV SV Index:   22 LVOT Area:     6.16 cm  RIGHT VENTRICLE RV Basal diam:  3.96 cm RV S prime:     8.49 cm/s TAPSE (M-mode): 3.5 cm LEFT ATRIUM         Index LA diam:    5.70 cm 2.26 cm/m  AORTIC VALVE LVOT Vmax:   64.30 cm/s LVOT Vmean:  42.400 cm/s LVOT VTI:    0.092 m  AORTA Ao Root diam: 3.40 cm MITRAL VALVE               TRICUSPID VALVE MV Area (PHT): 10.84 cm   TR Peak grad:   18.7 mmHg MV Decel Time: 70 msec     TR Vmax:        216.00 cm/s MV E velocity: 98.00 cm/s MV A velocity: 31.50 cm/s  SHUNTS MV E/A ratio:  3.11        Systemic VTI:  0.09 m                            Systemic Diam: 2.80 cm Tobias Alexander MD Electronically signed by Tobias Alexander MD Signature Date/Time: 08/15/2020/3:59:35 PM    Final       Medications:     Current Medications: . carvedilol  6.25 mg Oral BID WC  . digoxin  0.125 mg Oral Daily  . enoxaparin (LOVENOX) injection  70 mg Subcutaneous Q24H  . furosemide  60 mg Intravenous BID  . sacubitril-valsartan  1 tablet Oral BID  . sodium chloride flush  3 mL Intravenous Q12H     Infusions:   Assessment/Plan   Acute on Chronic systolic CHF - Normal EF in 2020 - diagnosed 9/21 Echo EF 20-25% RV moderately HK - repeat ECHO 07/2020 largely unchanged EF 20-25% Mod HK RV - suspect HTN vs PVC-mediated (PVC burden 4.3% - probably not high enough to cause CM) - EF has not improved but not taking GDMT consistently after  consistent use we will reassess and will pursue LHC if no improvement - NYHA III symptoms due to volume overload but improving - Currently on carvedilol 6.25,Entresto 97/101,digoxin 0.125, not on spiro here - diuresing with lasix IV 60 BID with improvement in symptoms, 4L output recorded likely had more - can discharge on last documented meds, carvedilol 6.25,Entresto 97/101,digoxin 0.125, spiro 12.5mg  and lasix 60 daily.    Tobacco Use disorder: -smoking 1/2ppd and vaping daily -discussed need for cessation  HTN,  severe - controlled when he's taking his medications  Frequent PVCs - Zio 12/21 4.3% PVCs - probably not high enough burden to cause CM - PVC's here on telemetry reflect outpatient results <5%  - needs sleep study   IVDA (heroin) - reports no use since 12/20  Morbid obesity - needs weight loss and sleep study   Has fu with Dr. Jens Som on the 28th We will arrange follow up as well  Length of Stay: 1  Angelita Ingles, MD  08/16/2020, 10:46 AM  Advanced Heart Failure Team Pager [phone] (M-F; 7a - 4p)  Please contact [facility] for night-coverage after hours (4p -7a ) and weekends on [email or web address]  Patient seen and examined with the above-signed Advanced Practice Provider and/or Housestaff. I personally reviewed laboratory data, imaging studies and relevant notes. I independently examined the patient and formulated the important aspects of the plan. I have edited the note to reflect any of my changes or salient points. I have personally discussed the plan with the patient and/or family.  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   Has diuresed well with IV lasix and feels better. Anxious to go home  Echo EF 20-25% RV moderately down Personally reviewed  General:  Well appearing. No resp difficulty HEENT: normal Neck: supple. no JVD. Carotids 2+ bilat; no bruits. No  lymphadenopathy or thryomegaly appreciated. Cor: PMI nondisplaced. Regular rate & rhythm. No rubs, gallops or murmurs. Lungs: clear Abdomen: obese soft, nontender, nondistended. No hepatosplenomegaly. No bruits or masses. Good bowel sounds. Extremities: no cyanosis, clubbing, rash, edema Neuro: alert & orientedx3, cranial nerves grossly intact. moves all 4 extremities w/o difficulty. Affect pleasant  Volume status much better after IV diuresis. Long talk about need for better compliance with HF meds. Will continue current HF regimen. Arrange HF f/u.  D/w Dr. [place].  Arvilla Meres, MD  2:49 PM

## 2020-08-16 NOTE — Discharge Instructions (Addendum)
Low Sodium Nutrition Therapy  Eating less sodium can help you if you have high blood pressure, heart failure, or kidney or liver disease.   Your body needs a little sodium, but too much sodium can cause your body to hold onto extra water. This extra water will raise your blood pressure and can cause damage to your heart, kidneys, or liver as they are forced to work harder.   Sometimes you can see how the extra fluid affects you because your hands, legs, or belly swell. You may also hold water around your heart and lungs, which makes it hard to breathe.   Even if you take medication for blood pressure or a water pill (diuretic) to remove fluid, it is still important to have less salt in your diet.   Check with your primary care provider before drinking alcohol since it may affect the amount of fluid in your body and how your heart, kidneys, or liver work. Sodium in Food A low-sodium meal plan limits the sodium that you get from food and beverages to 1,500-2,000 milligrams (mg) per day. Salt is the main source of sodium. Read the nutrition label on the package to find out how much sodium is in one serving of a food.  . Select foods with 140 milligrams (mg) of sodium or less per serving.  . You may be able to eat one or two servings of foods with a little more than 140 milligrams (mg) of sodium if you are closely watching how much sodium you eat in a day.  . Check the serving size on the label. The amount of sodium listed on the label shows the amount in one serving of the food. So, if you eat more than one serving, you will get more sodium than the amount listed.  Tips Cutting Back on Sodium . Eat more fresh foods.  . Fresh fruits and vegetables are low in sodium, as well as frozen vegetables and fruits that have no added juices or sauces.  . Fresh meats are lower in sodium than processed meats, such as bacon, sausage, and hotdogs.  . Not all processed foods are unhealthy, but some processed foods  may have too much sodium.  . Eat less salt at the table and when cooking. One of the ingredients in salt is sodium.  . One teaspoon of table salt has 2,300 milligrams of sodium.  . Leave the salt out of recipes for pasta, casseroles, and soups. . Be a smart shopper.  . Food packages that say "Salt-free", sodium-free", "very low sodium," and "low sodium" have less than 140 milligrams of sodium per serving.  . Beware of products identified as "Unsalted," "No Salt Added," "Reduced Sodium," or "Lower Sodium." These items may still be high in sodium. You should always check the nutrition label. . Add flavors to your food without adding sodium.  . Try lemon juice, lime juice, or vinegar.  . Dry or fresh herbs add flavor.  . Buy a sodium-free seasoning blend or make your own at home. . You can purchase salt-free or sodium-free condiments like barbeque sauce in stores and online. Ask your registered dietitian nutritionist for recommendations and where to find them.  .  Eating in Restaurants . Choose foods carefully when you eat outside your home. Restaurant foods can be very high in sodium. Many restaurants provide nutrition facts on their menus or their websites. If you cannot find that information, ask your server. Let your server know that you want your food   to be cooked without salt and that you would like your salad dressing and sauces to be served on the side.  .   . Foods Recommended . Food Group . Foods Recommended  . Grains . Bread, bagels, rolls without salted tops Homemade bread made with reduced-sodium baking powder Cold cereals, especially shredded wheat and puffed rice Oats, grits, or cream of wheat Pastas, quinoa, and rice Popcorn, pretzels or crackers without salt Corn tortillas  . Protein Foods . Fresh meats and fish; turkey bacon (check the nutrition labels - make sure they are not packaged in a sodium solution) Canned or packed tuna (no more than 4 ounces at 1 serving) Beans  and peas Soybeans) and tofu Eggs Nuts or nut butters without salt  . Dairy . Milk or milk powder Plant milks, such as rice and soy Yogurt, including Greek yogurt Small amounts of natural cheese (blocks of cheese) or reduced-sodium cheese can be used in moderation. (Swiss, ricotta, and fresh mozzarella cheese are lower in sodium than the others) Cream Cheese Low sodium cottage cheese  . Vegetables . Fresh and frozen vegetables without added sauces or salt Homemade soups (without salt) Low-sodium, salt-free or sodium-free canned vegetables and soups  . Fruit . Fresh and canned fruits Dried fruits, such as raisins, cranberries, and prunes  . Oils . Tub or liquid margarine, regular or without salt Canola, corn, peanut, olive, safflower, or sunflower oils  . Condiments . Fresh or dried herbs such as basil, bay leaf, dill, mustard (dry), nutmeg, paprika, parsley, rosemary, sage, or thyme.  Low sodium ketchup Vinegar  Lemon or lime juice Pepper, red pepper flakes, and cayenne. Hot sauce contains sodium, but if you use just a drop or two, it will not add up to much.  Salt-free or sodium-free seasoning mixes and marinades Simple salad dressings: vinegar and oil  .  . Foods Not Recommended . Food Group . Foods Not Recommended  . Grains . Breads or crackers topped with salt Cereals (hot/cold) with more than 300 mg sodium per serving Biscuits, cornbread, and other "quick" breads prepared with baking soda Pre-packaged bread crumbs Seasoned and packaged rice and pasta mixes Self-rising flours  . Protein Foods . Cured meats: Bacon, ham, sausage, pepperoni and hot dogs Canned meats (chili, vienna sausage, or sardines) Smoked fish and meats Frozen meals that have more than 600 mg of sodium per serving Egg substitute (with added sodium)  . Dairy . Buttermilk Processed cheese spreads Cottage cheese (1 cup may have over 500 mg of sodium; look for low-sodium.) American or feta cheese Shredded  Cheese has more sodium than blocks of cheese String cheese  . Vegetables . Canned vegetables (unless they are salt-free, sodium-free or low sodium) Frozen vegetables with seasoning and sauces Sauerkraut and pickled vegetables Canned or dried soups (unless they are salt-free, sodium-free, or low sodium) French fries and onion rings  . Fruit . Dried fruits preserved with additives that have sodium  . Oils . Salted butter or margarine, all types of olives  . Condiments . Salt, sea salt, kosher salt, onion salt, and garlic salt Seasoning mixes with salt Bouillon cubes Ketchup Barbeque sauce and Worcestershire sauce unless low sodium Soy sauce Salsa, pickles, olives, relish Salad dressings: ranch, blue cheese, Italian, and French.  .  . Low Sodium Sample 1-Day Menu  . Breakfast . 1 cup cooked oatmeal  . 1 slice whole wheat bread toast  . 1 tablespoon peanut butter without salt  . 1 banana  .   1 cup 1% milk  . Lunch . Tacos made with: 2 corn tortillas  .  cup black beans, low sodium  .  cup roasted or grilled chicken (without skin)  .  avocado  . Squeeze of lime juice  . 1 cup salad greens  . 1 tablespoon low-sodium salad dressing  .  cup strawberries  . 1 orange  . Afternoon Snack . 1/3 cup grapes  . 6 ounces yogurt  . Evening Meal . 3 ounces herb-baked fish  . 1 baked potato  . 2 teaspoons olive oil  .  cup cooked carrots  . 2 thick slices tomatoes on:  . 2 lettuce leaves  . 1 teaspoon olive oil  . 1 teaspoon balsamic vinegar  . 1 cup 1% milk  . Evening Snack . 1 apple  .  cup almonds without salt  .  . Low-Sodium Vegetarian (Lacto-Ovo) Sample 1-Day Menu  . Breakfast . 1 cup cooked oatmeal  . 1 slice whole wheat toast  . 1 tablespoon peanut butter without salt  . 1 banana  . 1 cup 1% milk  . Lunch . Tacos made with: 2 corn tortillas  .  cup black beans, low sodium  .  cup roasted or grilled chicken (without skin)  .  avocado  . Squeeze of lime juice  . 1  cup salad greens  . 1 tablespoon low-sodium salad dressing  .  cup strawberries  . 1 orange  . Evening Meal . Stir fry made with:  cup tofu  . 1 cup brown rice  .  cup broccoli  .  cup green beans  .  cup peppers  .  tablespoon peanut oil  . 1 orange  . 1 cup 1% milk  . Evening Snack . 4 strips celery  . 2 tablespoons hummus  . 1 hard-boiled egg  .  . Low-Sodium Vegan Sample 1-Day Menu  . Breakfast . 1 cup cooked oatmeal  . 1 tablespoon peanut butter without salt  . 1 cup blueberries  . 1 cup soymilk fortified with calcium, vitamin B12, and vitamin D  . Lunch . 1 small whole wheat pita  .  cup cooked lentils  . 2 tablespoons hummus  . 4 carrot sticks  . 1 medium apple  . 1 cup soymilk fortified with calcium, vitamin B12, and vitamin D  . Evening Meal . Stir fry made with:  cup tofu  . 1 cup brown rice  .  cup broccoli  .  cup green beans  .  cup peppers  .  tablespoon peanut oil  . 1 cup cantaloupe  . Evening Snack . 1 cup soy yogurt  .  cup mixed nuts  . Copyright 2020  Academy of [employer] and Dietetics. All rights reserved .  . Sodium Free Flavoring Tips .  . When cooking, the following items may be used for flavoring instead of salt or seasonings that contain sodium. . Remember: A little bit of spice goes a long way! Be careful not to overseason. . Spice Blend Recipe (makes about ? cup) . 5 teaspoons onion powder  . 2 teaspoons garlic powder  . 2 teaspoons paprika  . 2 teaspoon dry mustard  . 1 teaspoon crushed thyme leaves  .  teaspoon white pepper  .  teaspoon celery seed Food Item Flavorings  Beef Basil, bay leaf, caraway, curry, dill, dry mustard, garlic, grape jelly, green pepper, mace, marjoram, mushrooms (fresh), nutmeg, onion or onion powder, parsley, pepper,   rosemary, sage  Chicken Basil, cloves, cranberries, mace, mushrooms (fresh), nutmeg, oregano, paprika, parsley, pineapple, saffron, sage, savory, tarragon, thyme, tomato,  turmeric  Egg Chervil, curry, dill, dry mustard, garlic or garlic powder, green pepper, jelly, mushrooms (fresh), nutmeg, onion powder, paprika, parsley, rosemary, tarragon, tomato  Fish Basil, bay leaf, chervil, curry, dill, dry mustard, green pepper, lemon juice, marjoram, mushrooms (fresh), paprika, pepper, tarragon, tomato, turmeric  Lamb Cloves, curry, dill, garlic or garlic powder, mace, mint, mint jelly, onion, oregano, parsley, pineapple, rosemary, tarragon, thyme  Pork Applesauce, basil, caraway, chives, cloves, garlic or garlic powder, onion or onion powder, rosemary, thyme  Veal Apricots, basil, bay leaf, currant jelly, curry, ginger, marjoram, mushrooms (fresh), oregano, paprika  Vegetables Basil, dill, garlic or garlic powder, ginger, lemon juice, mace, marjoram, nutmeg, onion or onion powder, tarragon, tomato, sugar or sugar substitute, salt-free salad dressing, vinegar  Desserts Allspice, anise, cinnamon, cloves, ginger, mace, nutmeg, vanilla extract, other extracts   Copyright 2020  Academy of Nutrition and Dietetics. All rights reserved     Heart Failure, Self-Care Heart failure is a serious condition. The following information explains things you need to do to take care of yourself at home. To help you stay as healthy as possible, you may be asked to change your diet, take certain medicines, and make other changes in your life. Your doctor may also give you more specific instructions. If you have problems or questions, call your doctor. What are the risks? Having heart failure makes it more likely for you to have some problems. These problems can get worse if you do not take good care of yourself. Problems may include:  Damage to the kidneys, liver, or lungs.  Malnutrition.  Abnormal heart rhythms.  Blood clotting problems that could cause a stroke. Supplies needed:  Scale for weighing yourself.  Blood pressure monitor.  Notebook.  Medicines. How to care for  yourself when you have heart failure Medicines Take over-the-counter and prescription medicines only as told by your doctor. Take your medicines every day.  Do not stop taking your medicine unless your doctor tells you to do so.  Do not skip any medicines.  Get your prescriptions refilled before you run out of medicine. This is important.  Talk with your doctor if you cannot afford your medicines. Eating and drinking  Eat heart-healthy foods. Talk with a diet specialist (dietitian) to create an eating plan.  Limit salt (sodium) if told by your doctor. Ask your diet specialist to tell you which seasonings are healthy for your heart.  Cook in healthy ways instead of frying. Healthy ways of cooking include roasting, grilling, broiling, baking, poaching, steaming, and stir-frying.  Choose foods that: ? Have no trans fat. ? Are low in saturated fat and cholesterol.  Choose healthy foods, such as: ? Fresh or frozen fruits and vegetables. ? Fish. ? Low-fat (lean) meats. ? Legumes, such as beans, peas, and lentils. ? Fat-free or low-fat dairy products. ? Whole-grain foods. ? High-fiber foods.  Limit how much fluid you drink, if told by your doctor.   Alcohol use  Do not drink alcohol if: ? Your doctor tells you not to drink. ? Your heart was damaged by alcohol, or you have very bad heart failure. ? You are pregnant, may be pregnant, or are planning to become pregnant.  If you drink alcohol: ? Limit how much you have to:  0-1 drink a day for women.  0-2 drinks a day for men. ? Know how much alcohol  is in your drink. In the [place] one drink equals one 12 oz bottle of beer (355 mL), one 5 oz glass of wine (148 mL), or one 1 oz glass of hard liquor (44 mL). Lifestyle  Do not smoke or use any products that contain nicotine or tobacco. If you need help quitting, ask your doctor. ? Do not use nicotine gum or patches before talking to your doctor.  Do not use illegal  drugs.  Lose weight if told by your doctor.  Do physical activity if told by your doctor. Talk to your doctor before you begin an exercise if: ? You are an older adult. ? You have very bad heart failure.  Learn to manage stress. If you need help, ask your doctor.  Get physical rehab (rehabilitation) to help you stay independent and to help with your quality of life.  Participate in a cardiac rehab program. This program helps you improve your health through exercise, education, and counseling.  Plan time to rest when you get tired.   Check weight and blood pressure  Weigh yourself every day. This will help you to know if fluid is building up in your body. ? Weigh yourself every morning after you pee (urinate) and before you eat breakfast. ? Wear the same amount of clothing each time. ? Write down your daily weight. Give your record to your doctor.  Check and write down your blood pressure as told by your doctor.  Check your pulse as told by your doctor.   Dealing with very hot and very cold weather  If it is very hot: ? Avoid activities that take a lot of energy. ? Use air conditioning or fans, or find a cooler place. ? Avoid caffeine and alcohol. ? Wear clothing that is loose-fitting, lightweight, and light-colored.  If it is very cold: ? Avoid activities that take a lot of energy. ? Layer your clothes. ? Wear mittens or gloves, a hat, and a face covering when you go outside. ? Avoid alcohol. Follow these instructions at home:  Stay up to date with shots (vaccines). Get pneumococcal and flu (influenza) shots.  Keep all follow-up visits. Contact a doctor if:  You gain 2-3 lb (1-1.4 kg) in 24 hours or 5 lb (2.3 kg) in a week.  You have increasing shortness of breath.  You cannot do your normal activities.  You get tired easily.  You cough a lot.  You do not feel like eating or feel like you may vomit (nauseous).  You have swelling in your hands, feet, ankles, or  belly (abdomen).  You cannot sleep well because it is hard to breathe.  You feel like your heart is beating fast (palpitations).  You get dizzy when you stand up.  You feel depressed or sad. Get help right away if:  You have trouble breathing.  You or someone else notices a change in your behavior, such as having trouble staying awake.  You have chest pain or discomfort.  You pass out (faint). These symptoms may be an emergency. Get help right away. Call your local emergency services (911 in the [place]  Do not wait to see if the symptoms will go away.  Do not drive yourself to the hospital. Summary  Heart failure is a serious condition. To care for yourself, you may have to change your diet, take medicines, and make other lifestyle changes.  Take your medicines every day. Do not stop taking them unless your doctor tells you to do  so.  Limit salt and eat heart-healthy foods.  Ask your doctor if you can drink alcohol. You may have to stop alcohol use if you have very bad heart failure.  Contact your doctor if you gain weight quickly or feel that your heart is beating too fast. Get help right away if you pass out or have chest pain or trouble breathing. This information is not intended to replace advice given to you by your health care provider. Make sure you discuss any questions you have with your health care provider. Document Revised: 12/02/2019 Document Reviewed: 12/02/2019 Elsevier Patient Education  2021 [employer].

## 2020-08-19 ENCOUNTER — Ambulatory Visit: Payer: Self-pay | Admitting: Cardiology

## 2020-08-27 ENCOUNTER — Other Ambulatory Visit (HOSPITAL_COMMUNITY): Payer: Self-pay

## 2020-08-27 ENCOUNTER — Ambulatory Visit (HOSPITAL_COMMUNITY)
Admit: 2020-08-27 | Discharge: 2020-08-27 | Disposition: A | Discharge status: Home / Self Care | Payer: Self-pay | Attending: Internal Medicine | Admitting: Internal Medicine

## 2020-08-27 ENCOUNTER — Telehealth (HOSPITAL_COMMUNITY): Payer: Self-pay | Admitting: Cardiology

## 2020-08-27 ENCOUNTER — Encounter (HOSPITAL_COMMUNITY): Payer: Self-pay

## 2020-08-27 ENCOUNTER — Other Ambulatory Visit: Payer: Self-pay

## 2020-08-27 VITALS — BP 118/84 | HR 102 | Wt 325.8 lb

## 2020-08-27 DIAGNOSIS — I420 Dilated cardiomyopathy: Secondary | ICD-10-CM

## 2020-08-27 DIAGNOSIS — F17210 Nicotine dependence, cigarettes, uncomplicated: Secondary | ICD-10-CM | POA: Insufficient documentation

## 2020-08-27 DIAGNOSIS — I5022 Chronic systolic (congestive) heart failure: Secondary | ICD-10-CM

## 2020-08-27 DIAGNOSIS — I110 Hypertensive heart disease with heart failure: Secondary | ICD-10-CM | POA: Insufficient documentation

## 2020-08-27 DIAGNOSIS — I493 Ventricular premature depolarization: Secondary | ICD-10-CM

## 2020-08-27 DIAGNOSIS — I10 Essential (primary) hypertension: Secondary | ICD-10-CM

## 2020-08-27 DIAGNOSIS — Z6841 Body Mass Index (BMI) 40.0 and over, adult: Secondary | ICD-10-CM | POA: Insufficient documentation

## 2020-08-27 DIAGNOSIS — Z79899 Other long term (current) drug therapy: Secondary | ICD-10-CM | POA: Insufficient documentation

## 2020-08-27 LAB — BASIC METABOLIC PANEL
Anion gap: 8 (ref 5–15)
BUN: 15 mg/dL (ref 6–20)
CO2: 23 mmol/L (ref 22–32)
Calcium: 8.8 mg/dL — ABNORMAL LOW (ref 8.9–10.3)
Chloride: 108 mmol/L (ref 98–111)
Creatinine, Ser: 0.92 mg/dL (ref 0.61–1.24)
GFR, Estimated: >60 mL/min (ref >60)
Glucose, Bld: 85 mg/dL (ref 70–99)
Potassium: 4.2 mmol/L (ref 3.5–5.1)
Sodium: 139 mmol/L (ref 135–145)

## 2020-08-27 MED ORDER — FUROSEMIDE 20 MG PO TABS: 40.0000 mg | ORAL_TABLET | Freq: Every day | ORAL | 3 refills | Status: DC

## 2020-08-27 MED ORDER — CARVEDILOL 12.5 MG PO TABS
12.5000 mg | ORAL_TABLET | Freq: Two times a day (BID) | ORAL | 3 refills | Status: DC
  Filled 2020-08-27 – 2020-09-06 (×2): qty 60, 30d supply, fill #0

## 2020-08-27 MED ORDER — DAPAGLIFLOZIN PROPANEDIOL 10 MG PO TABS
10.0000 mg | ORAL_TABLET | Freq: Every day | ORAL | 11 refills | Status: DC
  Filled 2020-08-27 – 2020-09-06 (×2): qty 30, 30d supply, fill #0

## 2020-08-27 NOTE — Patient Instructions (Signed)
Labs done today. We will contact you only if your labs are abnormal.  START Farxiga 10mg  (1 tablet) by mouth daily.  INCREASE Carvedilol to 12.5mg  (1 tablet) by mouth 2 times daily.  DECREASE Lasix to 40mg  (2 tablets) by mouth daily.  No other medication changes were made. Please continue all current medications as prescribed.  Your physician recommends that you schedule a follow-up appointment in: 2 months with an echo prior to your exam.  Your physician has requested that you have an echocardiogram. Echocardiography is a painless test that uses sound waves to create images of your heart. It provides your doctor with information about the size and shape of your heart and how well your heart's chambers and valves are working. This procedure takes approximately one hour. There are no restrictions for this procedure.  If you have any questions or concerns before your next appointment please send a message through [place] or call our office at [phone].    TO LEAVE A MESSAGE FOR THE NURSE SELECT OPTION 2, PLEASE LEAVE A MESSAGE INCLUDING: . YOUR NAME . DATE OF BIRTH . CALL BACK NUMBER . REASON FOR CALL**this is important as we prioritize the call backs  YOU WILL RECEIVE A CALL BACK THE SAME DAY AS LONG AS YOU CALL BEFORE 4:00 PM   Do the following things EVERYDAY: 1) Weigh yourself in the morning before breakfast. Write it down and keep it in a log. 2) Take your medicines as prescribed 3) Eat low salt foods--Limit salt (sodium) to 2000 mg per day.  4) Stay as active as you can everyday 5) Limit all fluids for the day to less than 2 liters   At [facility], you and your health needs are our priority. As part of our continuing mission to provide you with exceptional heart care, we have created designated Provider Care Teams. These Care Teams include your primary Cardiologist (physician) and Advanced Practice Providers (APPs- Physician Assistants and Nurse  Practitioners) who all work together to provide you with the care you need, when you need it.   You may see any of the following providers on your designated Care Team at your next follow up: [address] [phone] . Dr Marland Kitchen . Arvilla Meres, NP . Marca Ancona, PA . Tonye Becket, PharmD   Please be sure to bring in all your medications bottles to every appointment.

## 2020-08-27 NOTE — Progress Notes (Addendum)
ADVANCED HF CLINIC NOTE  Primary Care: Patient, [name] (Inactive) Primary Cardiologist: Olga Millers, MD  HPI:  Donald Murray is a morbidly obese 35 y.o. male with HTN, IVDA (heroin), tobacco use and systolic HF (onset 9/21) referred by Dr. Jens Som for further management of his HF.   Echo 10/20 EF 60-65%   Admitted to [facility] 9/21 with new onset HF in setting of severe HTN 174/128.Marland Kitchen ECG with sinus tach and frequent PVCs.   Echo 02/16/20: EF 20-25% Moderate RV dysfunction.   Seen by [facility] in HF Consultation for the first time 04/24/20. 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 Lasix and Entresto increased. Digoxin added. Zio placed to quantify PVC burden - 4.3%. Zio 12/21 Sinus - average HR 104 4.3% PVCs  Last visit in our clinic was in December doing well at that time, Sherryll Burger was increased.  He no showed his follow up appointment in late February.    Saw him in the hospital 08/15/20 with medication and dietary non adherence leading to fluid retention and he was having trouble sleeping, getting short of breath at night and having PND.  He was restarted on GDMT and given IV lasix 60mg  x3 doses responded well and was discharged the following day.    Since hospitalization sleeping better, still a little shortness of breath with exertion but much improved.  Denies any chest pain episodes.  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  Taking meds as prescribed has only missed one dose since discharge.  Has not been weighing himself at home.  Weight by our scales is up about 3 kilos compared with his weight during admission but he doesn't feel like he has gained any extra fluid.      Past Medical History:  Diagnosis Date  . CHF (congestive heart failure) (HCC)   . Hypertension   . Obesity   . Snoring     Current Outpatient Medications  Medication Sig Dispense Refill  . carvedilol  (COREG) 6.25 MG tablet TAKE 1 TABLET (6.25 MG TOTAL) BY MOUTH 2 (TWO) TIMES DAILY WITH A MEAL. 180 tablet 3  . digoxin (LANOXIN) 0.125 MG tablet TAKE 1 TABLET BY MOUTH DAILY 34 tablet 2  . furosemide (LASIX) 20 MG tablet Take 3 tablets (60 mg total) by mouth daily. Take additional 20 mg daily for edema or weight gain of 3 lbs in 1 day or 5 lbs in 2 days. 270 tablet 3  . sacubitril-valsartan (ENTRESTO) 97-103 MG Take 1 tablet by mouth 2 (two) times daily. 60 tablet 3  . spironolactone (ALDACTONE) 25 MG tablet TAKE 1/2 TABLET BY MOUTH DAILY 17 tablet 2   No current facility-administered medications for this encounter.    No Known Allergies    Social History   Socioeconomic History  . Marital status: Single    Spouse name: Not on file  . Number of children: Not on file  . Years of education: Not on file  . Highest education level: Not on file  Occupational History  . Not on file  Tobacco Use  . Smoking status: Current Every Day Smoker    Packs/day: 1.00    Years: 16.00    Pack years: 16.00    Types: Cigarettes  . Smokeless tobacco: Never Used  Vaping Use  . Vaping Use: Some days  . Substances: Nicotine  Substance and Sexual Activity  .  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 Drug use: Not Currently    Comment: Pt stated "It was opiates"  . Sexual activity: Not on file  Other Topics Concern  . Not on file  Social History Narrative  . Not on file   Social Determinants of Health   Financial Resource Strain: Not on file  Food Insecurity: Not on file  Transportation Needs: Not on file  Physical Activity: Not on file  Stress: Not on file  Social Connections: Not on file  Intimate Partner Violence: Not on file      Family History  Problem Relation Age of Onset  . Hypertension Mother   . Hypertension Father     Vitals:   08/27/20 1410  BP: 118/84  Pulse: (!) 102  SpO2: 96%  Weight: (!) 147.8 kg (325 lb 12.8 oz)    PHYSICAL  EXAM: Cardiac: JVD flat, tachycardic with normal rhythm, clear s1 and s2, no murmurs, rubs or gallops, no LE edema Pulmonary: CTAB, not in distress Abdominal: non distended abdomen, soft and nontender Psych: Alert, conversant, in good spirits  ECG:  NSR rate 98   ASSESSMENT & PLAN:  1. Chronic systolic HF - diagnosed 9/21 Echo EF 20-25% RV moderately HK - suspect HTN vs PVC-mediated (PVC burden 4.3% - probably not high enough to cause CM) - NYHA II-III symptoms - Continue Entresto 97/103 - Continue digoxin 0.125 (could not get level today due to him taking it before arrival but he is instructed to hold dose next visit) - Continue spiro 12.5 - Increase carvedilol to 12.5mg  BID - Start Farxiga and decrease lasix back to 40mg  daily - See back in 2 months with repeat echo. If Ef not improving will have to consider cath  2. HTN, severe - well controlled when taking his meds  3. Frequent PVCs - Zio 12/21 4.3% PVCs - probably not high enough burden to cause CM  - needs sleep study - does not have insurance to cover  4. IVDA (heroin) - reports no use since 12/20  5. Morbid obesity - needs weight loss and sleep study(no insurance currently)  1/21, MD  2:21 PM

## 2020-08-27 NOTE — Telephone Encounter (Signed)
Call appt remider palced Unable to complete call number invalid

## 2020-08-28 ENCOUNTER — Encounter (HOSPITAL_COMMUNITY): Payer: Self-pay

## 2020-08-30 ENCOUNTER — Telehealth (HOSPITAL_COMMUNITY): Payer: Self-pay | Admitting: Pharmacy Technician

## 2020-08-30 NOTE — Telephone Encounter (Signed)
Advanced Heart Failure Patient Advocate Encounter  Sent in [employer] application via fax.  Will follow up.  

## 2020-09-04 ENCOUNTER — Other Ambulatory Visit (HOSPITAL_COMMUNITY): Payer: Self-pay

## 2020-09-06 ENCOUNTER — Other Ambulatory Visit (HOSPITAL_COMMUNITY): Payer: Self-pay

## 2020-09-06 MED FILL — Digoxin Tab 125 MCG (0.125 MG): ORAL | 34 days supply | Qty: 34 | Fill #0 | Status: AC

## 2020-09-06 MED FILL — Spironolactone Tab 25 MG: ORAL | 34 days supply | Qty: 17 | Fill #0 | Status: AC

## 2020-09-12 ENCOUNTER — Telehealth (HOSPITAL_COMMUNITY): Payer: Self-pay | Admitting: Pharmacy Technician

## 2020-09-12 NOTE — Telephone Encounter (Signed)
Advanced Heart Failure Patient Advocate Encounter   Patient was approved to receive Farxiga from [employer]  Patient ID: [id number] Effective dates: 09/02/20 through 09/01/21  Attempted to call the patient and the call would not go through after multiple attempts.  Archer Asa, CPhT

## 2020-09-24 ENCOUNTER — Encounter (HOSPITAL_COMMUNITY): Payer: Self-pay

## 2020-10-01 ENCOUNTER — Inpatient Hospital Stay: Payer: Self-pay | Admitting: Internal Medicine

## 2020-11-06 ENCOUNTER — Encounter (HOSPITAL_COMMUNITY): Payer: Self-pay | Admitting: Internal Medicine

## 2020-11-06 ENCOUNTER — Ambulatory Visit (HOSPITAL_COMMUNITY): Admission: RE | Admit: 2020-11-06 | Payer: Self-pay | Source: Ambulatory Visit

## 2020-12-29 ENCOUNTER — Other Ambulatory Visit: Payer: Self-pay

## 2020-12-29 ENCOUNTER — Encounter (HOSPITAL_COMMUNITY): Payer: Self-pay | Admitting: Internal Medicine

## 2020-12-29 ENCOUNTER — Emergency Department (HOSPITAL_COMMUNITY): Payer: Self-pay

## 2020-12-29 ENCOUNTER — Observation Stay (HOSPITAL_COMMUNITY)
Admission: EM | Admit: 2020-12-29 | Discharge: 2020-12-30 | Disposition: A | Discharge status: Home / Self Care | Payer: Self-pay | Attending: Emergency Medicine | Admitting: Emergency Medicine

## 2020-12-29 DIAGNOSIS — Z20822 Contact with and (suspected) exposure to covid-19: Secondary | ICD-10-CM | POA: Insufficient documentation

## 2020-12-29 DIAGNOSIS — F17210 Nicotine dependence, cigarettes, uncomplicated: Secondary | ICD-10-CM | POA: Insufficient documentation

## 2020-12-29 DIAGNOSIS — I5082 Biventricular heart failure: Principal | ICD-10-CM | POA: Insufficient documentation

## 2020-12-29 DIAGNOSIS — Z79899 Other long term (current) drug therapy: Secondary | ICD-10-CM | POA: Insufficient documentation

## 2020-12-29 DIAGNOSIS — I110 Hypertensive heart disease with heart failure: Secondary | ICD-10-CM | POA: Insufficient documentation

## 2020-12-29 DIAGNOSIS — I509 Heart failure, unspecified: Secondary | ICD-10-CM

## 2020-12-29 LAB — CBC
HCT: 45.1 % (ref 39.0–52.0)
Hemoglobin: 14.2 g/dL (ref 13.0–17.0)
MCH: 25.8 pg — ABNORMAL LOW (ref 26.0–34.0)
MCHC: 31.5 g/dL (ref 30.0–36.0)
MCV: 82 fL (ref 80.0–100.0)
Platelets: 291 10*3/uL (ref 150–400)
RBC: 5.5 MIL/uL (ref 4.22–5.81)
RDW: 16.4 % — ABNORMAL HIGH (ref 11.5–15.5)
WBC: 10.8 10*3/uL — ABNORMAL HIGH (ref 4.0–10.5)
nRBC: 0 % (ref 0.0–0.2)

## 2020-12-29 LAB — BASIC METABOLIC PANEL
Anion gap: 9 (ref 5–15)
BUN: 14 mg/dL (ref 6–20)
CO2: 21 mmol/L — ABNORMAL LOW (ref 22–32)
Calcium: 8.5 mg/dL — ABNORMAL LOW (ref 8.9–10.3)
Chloride: 108 mmol/L (ref 98–111)
Creatinine, Ser: 1.11 mg/dL (ref 0.61–1.24)
GFR, Estimated: >60 mL/min (ref >60)
Glucose, Bld: 95 mg/dL (ref 70–99)
Potassium: 4.9 mmol/L (ref 3.5–5.1)
Sodium: 138 mmol/L (ref 135–145)

## 2020-12-29 LAB — RESP PANEL BY RT-PCR (FLU A&B, COVID) ARPGX2
Influenza A by PCR: NEGATIVE
Influenza B by PCR: NEGATIVE
SARS Coronavirus 2 by RT PCR: NEGATIVE

## 2020-12-29 LAB — TROPONIN I (HIGH SENSITIVITY)
Troponin I (High Sensitivity): 43 ng/L — ABNORMAL HIGH (ref <18)
Troponin I (High Sensitivity): 51 ng/L — ABNORMAL HIGH (ref <18)

## 2020-12-29 LAB — BRAIN NATRIURETIC PEPTIDE: B Natriuretic Peptide: 1256.1 pg/mL — ABNORMAL HIGH (ref 0.0–100.0)

## 2020-12-29 MED ORDER — FUROSEMIDE 10 MG/ML IJ SOLN
80.0000 mg | Freq: Once | INTRAMUSCULAR | Status: AC
  Administered 2020-12-29: 80 mg via INTRAVENOUS
  Filled 2020-12-29: qty 8

## 2020-12-29 MED ORDER — SODIUM CHLORIDE 0.9 % IV SOLN: 250.0000 mL | INTRAVENOUS | Status: DC | PRN

## 2020-12-29 MED ORDER — EMTRICITABINE-TENOFOVIR AF 200-25 MG PO TABS
1.0000 | ORAL_TABLET | Freq: Every day | ORAL | Status: DC
  Administered 2020-12-29 – 2020-12-30 (×2): 1 via ORAL
  Filled 2020-12-29 (×2): qty 1

## 2020-12-29 MED ORDER — SACUBITRIL-VALSARTAN 97-103 MG PO TABS
1.0000 | ORAL_TABLET | Freq: Two times a day (BID) | ORAL | Status: DC
  Administered 2020-12-29 – 2020-12-30 (×3): 1 via ORAL
  Filled 2020-12-29 (×4): qty 1

## 2020-12-29 MED ORDER — SODIUM CHLORIDE 0.9% FLUSH
3.0000 mL | Freq: Two times a day (BID) | INTRAVENOUS | Status: DC
  Administered 2020-12-29 – 2020-12-30 (×2): 3 mL via INTRAVENOUS

## 2020-12-29 MED ORDER — ACETAMINOPHEN 325 MG PO TABS: 650.0000 mg | ORAL_TABLET | ORAL | Status: DC | PRN

## 2020-12-29 MED ORDER — SODIUM CHLORIDE 0.9% FLUSH: 3.0000 mL | INTRAVENOUS | Status: DC | PRN

## 2020-12-29 MED ORDER — SPIRONOLACTONE 12.5 MG HALF TABLET
12.5000 mg | ORAL_TABLET | Freq: Every day | ORAL | Status: DC
  Administered 2020-12-29 – 2020-12-30 (×2): 12.5 mg via ORAL
  Filled 2020-12-29 (×2): qty 1

## 2020-12-29 MED ORDER — LOSARTAN POTASSIUM 50 MG PO TABS: 50.0000 mg | ORAL_TABLET | Freq: Every day | ORAL | Status: DC

## 2020-12-29 MED ORDER — ENOXAPARIN SODIUM 80 MG/0.8ML IJ SOSY
65.0000 mg | PREFILLED_SYRINGE | INTRAMUSCULAR | Status: DC
  Administered 2020-12-29 – 2020-12-30 (×2): 65 mg via SUBCUTANEOUS
  Filled 2020-12-29 (×2): qty 0.65
  Filled 2020-12-29: qty 0.8

## 2020-12-29 MED ORDER — ONDANSETRON HCL 4 MG/2ML IJ SOLN: 4.0000 mg | Freq: Four times a day (QID) | INTRAMUSCULAR | Status: DC | PRN

## 2020-12-29 MED ORDER — DAPAGLIFLOZIN PROPANEDIOL 10 MG PO TABS
10.0000 mg | ORAL_TABLET | Freq: Every day | ORAL | Status: DC
  Administered 2020-12-30: 10 mg via ORAL
  Filled 2020-12-29: qty 1

## 2020-12-29 NOTE — H&P (Signed)
Date: 12/29/2020               Patient Name:  Donald Murray MRN: [id number]  DOB: 12-12-1985 Age / Sex: 35 y.o., male   PCP: Patient, [name] (Inactive)         Medical Service: Internal Medicine Teaching Service         Attending Physician: Dr. Inez Catalina, MD    First Contact: Dr. Welton Flakes Pager: [phone]  Second Contact: Dr. Marijo Conception Pager: [phone]       After Hours (After 5p/  First Contact Pager: [phone]  weekends / holidays): Second Contact Pager: [phone]   Chief Complaint: Shortness of breath  History of Present Illness:  Patient is 35 year old male with PMH of CHF with ejection fraction of 20-25%, HTN, and polysubstance use presenting to ED with shortness of breath. The shortness of breath started Thursday without any specific initiating factor as stated by the patient and has gotten worse. He endorsed multiple episodes of admissions due to this as he has CHF. He stated the shortness of breath is worse with exertion and laying flat and relieved with rest and sitting. He does not have any home oxygen use. Currently he get short of breath with about 20 feet but if he is not in an exacerbation he does not get short of breath with general exertion. He follows with Dr. Jens Som at [facility] but does not have a PCP.  He endorsed compliance with his medications but stated he only takes lasix when he gets short of breath or gains weight were his instructions for this medications. He endorsed taking 6 (40 mg) pills since having the shortness of breath without relieve.    Meds: Patient taking meds as prescribed except lasix he takes after gaining weight or shortness of breath.  Current Meds  Medication Sig   carvedilol (COREG) 12.5 MG tablet Take 1 tablet (12.5 mg total) by mouth 2 (two) times daily with a meal.   dapagliflozin propanediol (FARXIGA) 10 MG TABS tablet Take 1 tablet (10 mg total) by mouth daily before breakfast.   DESCOVY 200-25 MG tablet Take 1 tablet  by mouth daily.   digoxin (LANOXIN) 0.125 MG tablet TAKE 1 TABLET BY MOUTH DAILY (Patient taking differently: Take 0.125 mg by mouth daily.)   furosemide (LASIX) 20 MG tablet Take 2 tablets (40 mg total) by mouth daily. Take additional 20 mg daily for edema or weight gain of 3 lbs in 1 day or 5 lbs in 2 days.   sacubitril-valsartan (ENTRESTO) 97-103 MG Take 1 tablet by mouth 2 (two) times daily.   spironolactone (ALDACTONE) 25 MG tablet TAKE 1/2 TABLET BY MOUTH DAILY (Patient taking differently: Take 12.5 mg by mouth daily.)    Allergies: None Allergies as of 12/29/2020   (No Known Allergies)   Past Medical History:  Diagnosis Date   CHF (congestive heart failure) (HCC)    Hypertension    Obesity    Snoring     Family History:  - Father HTN -Mother HTN -Grandfather died from CHF last month  Social History:  Patient lives with 3 buddies and endorsed social support. He states he smokes 1 ppd since 15 but does not drink. He denied any recent herion use and stated last time was in 04/2019 and currently he is in recovery. He endorsed drinking a lot of water but wasn't able to quantify and stated he eats 2 meals a day but they are  not consistent. Overall did not endorse good lifestyle.  Socioeconomic History   Marital status: Single      Spouse name: Not on file   Number of children: Not on file   Years of education: Not on file   Highest education level: Not on file  Occupational History   Not on file  Tobacco Use   Smoking status: Every Day      Packs/day: 1.00      Years: 16.00      Pack years: 16.00      Types: Cigarettes   Smokeless tobacco: Never  Vaping Use   Vaping Use: Some days   Substances: Nicotine  Substance and Sexual Activity   Alcohol use: Not Currently      Comment: Pt stated "2 years clean"   Drug use: Not Currently      Comment: Pt stated "It was opiates"   Sexual activity: Not on file  Other Topics Concern   Not on file  Social History Narrative    Not on file    Social Determinants of Health    Financial Resource Strain: Not on file  Food Insecurity: Not on file  Transportation Needs: Not on file  Physical Activity: Not on file  Stress: Not on file  Social Connections: Not on file  Intimate Partner Violence: Not on file    Review of Systems: Review of Systems Constitutional:  Negative for chills, fever and weight loss. HENT:  Negative for hearing loss and tinnitus.   Eyes:  Negative for blurred vision and double vision. Respiratory:  Positive for cough and shortness of breath. Negative for hemoptysis, sputum production and wheezing.   Cardiovascular:  Positive for orthopnea. Negative for chest pain, palpitations and leg swelling. Gastrointestinal:  Negative for abdominal pain, constipation, diarrhea, heartburn, nausea and vomiting. Genitourinary:  Negative for dysuria and urgency. Musculoskeletal:  Positive for back pain. Negative for myalgias and neck pain. Neurological:  Negative for dizziness, tingling, weakness and headaches. Psychiatric/Behavioral:  Negative for depression and suicidal ideas.     Physical Exam: Blood pressure 121/78, pulse (!) 36, temperature 98 F (36.7 C), resp. rate (!) 33, height 5\' 11"  (1.803 m), weight 133.8 kg, SpO2 100 %.  Physical Exam Constitutional:      General: He is not in acute distress.    Appearance: He is obese. He is not ill-appearing. HENT:    Head: Normocephalic.    Mouth/Throat:    Mouth: Mucous membranes are moist.    Pharynx: Oropharynx is clear. No oropharyngeal exudate. Eyes:    Extraocular Movements: Extraocular movements intact.    Pupils: Pupils are equal, round, and reactive to light. Cardiovascular:    Rate and Rhythm: Normal rate and regular rhythm.    Pulses: Normal pulses.    Heart sounds: Normal heart sounds. Pulmonary:    Effort: Pulmonary effort is normal.    Breath sounds: Examination of the right-lower field reveals rales. Examination of the left-lower  field reveals rales. Decreased breath sounds and rales present. Chest:    Chest wall: No mass or deformity. Abdominal:    General: Bowel sounds are normal.    Palpations: Abdomen is soft. Musculoskeletal:        General: Normal range of motion.    Cervical back: Normal range of motion and neck supple.    Right lower leg: No edema.    Left lower leg: No edema. Skin:    General: Skin is warm and dry.    Capillary Refill: Capillary refill  takes less than 2 seconds.    Findings: No erythema.    Comments: Left hand scar due to previous incision and drainage.   Neurological:    General: No focal deficit present.    Mental Status: He is alert and oriented to person, place, and time. Psychiatric:        Mood and Affect: Mood normal.        Behavior: Behavior normal.       EKG: personally reviewed my interpretation is sinus tachycardia with PVCs.    CXR: personally reviewed my interpretation is cardiomegaly and congestion suggesting fluid overload.   Assessment & Plan by Problem: Active Problems:   Acute on chronic heart failure (HCC)   Acute on Chronic Heart Failure Patient has CHF with last echo done in 07/2020 showing LV ejection fraction, by estimation, is 20 to 25%. The LV having severely decreased function. The LV cavity size was moderately dilated with LV having moderate concentric left ventricular hypertrophy.  Grade III diastolic dysfunction (restrictive). Elevated left atrial pressure. Right ventricular systolic function is severely reduced. The right ventricular size is moderately enlarged. Left atrium is severely dilated while right atrium is mildly dilated. Appears patient is having his usual exacerbation. This could be due to noncompliance vs overhydration. Instruction for Lasix suggest pt should take 40 mg daily with additional if weight increase or shortness of breath. But patient states he only takes it if needed and around twice a week. He also stated he drinks a lot of  water but unable to quantify it.   Plan: -Lasix 80 IV given in ED -Restart home oral dose lasix with IV as needed -Restart home cardiac meds: Digioixin, Farxiga, Aldactone 12.5 mg, Entresto 97-103 mg     Hypertension: Patient has hypertension controlled with Coreg 12.5 BID. His Aldactone should be helping with his blood pressure as well. He denies taking any medications today as he has been in the hospital since early morning.   Plan: -Aldactone 12.5 mg, Lasix  -Hold Coreg until out of exacerbation     Tobacco use Patient smokes 1 ppd since age of 2 thus having 19 pack years of smoking history.   Plan: -Encourage cessation -Nicotine patch while inpatient      DVT prophx: Diet: Cardiac Bowel: PRN  Code: Full Code   Dispo: Admit patient to Observation with expected length of stay less than 2 midnights.  Signed: Gwenevere Abbot, MD 12/29/2020, 8:28 AM  Pager: [phone] After 5pm on weekdays and 1pm on weekends: On Call pager: [phone]

## 2020-12-29 NOTE — ED Provider Notes (Signed)
[facility] EMERGENCY DEPARTMENT Provider Note   CSN: [id number] Arrival date & time: 12/29/20  0327     History Chief Complaint  Patient presents with   Shortness of Breath    Donald Murray is a 35 y.o. male.  The history is provided by the patient.  Shortness of Breath Severity:  Moderate Onset quality:  Gradual Duration:  3 days Timing:  Constant Progression:  Worsening Chronicity:  Recurrent Context: activity   Context comment:  HF history. woprsening SOB, cant lay flat or walk without SOB. Takes lasix as needed. Took doses the last several days and doesnt feel any better Relieved by:  Nothing Worsened by:  Exertion Ineffective treatments:  Diuretics Associated symptoms: no abdominal pain, no chest pain, no cough, no ear pain, no fever, no rash, no sore throat, no sputum production and no vomiting       Past Medical History:  Diagnosis Date   CHF (congestive heart failure) (HCC)    Hypertension    Obesity    Snoring     Patient Active Problem List   Diagnosis Date Noted   PVC (premature ventricular contraction) 08/15/2020   Essential hypertension 03/20/2020   Acute congestive heart failure (HCC)    Cellulitis 03/04/2019   IVDU (intravenous drug user) 03/04/2019    Past Surgical History:  Procedure Laterality Date   I & D EXTREMITY Right 03/04/2019   Procedure: IRRIGATION AND DEBRIDEMENT OF RIGHT ELBOW;  Surgeon: Dominica Severin, MD;  Location: [facility] OR;  Service: Orthopedics;  Laterality: Right;   IRRIGATION AND DEBRIDEMENT ABSCESS Left 03/04/2019   Procedure: Irrigation And Debridement Abscess Left hand;  Surgeon: Dominica Severin, MD;  Location: [facility] OR;  Service: Orthopedics;  Laterality: Left;   UMBILICAL HERNIA REPAIR         Family History  Problem Relation Age of Onset   Hypertension Mother    Hypertension Father     Social History   Tobacco Use   Smoking status: Every Day    Packs/day: 1.00    Years: 16.00    Pack years:  16.00    Types: Cigarettes   Smokeless tobacco: Never  Vaping Use   Vaping Use: Some days   Substances: Nicotine  Substance Use Topics   Alcohol use: Not Currently    Comment: Pt stated "2 years clean"   Drug use: Not Currently    Comment: Pt stated "It was opiates"    Home Medications Prior to Admission medications   Medication Sig Start Date End Date Taking? Authorizing Provider  carvedilol (COREG) 12.5 MG tablet Take 1 tablet (12.5 mg total) by mouth 2 (two) times daily with a meal. 08/27/20   Angelita Ingles, MD  dapagliflozin propanediol (FARXIGA) 10 MG TABS tablet Take 1 tablet (10 mg total) by mouth daily before breakfast. 08/27/20   Angelita Ingles, MD  digoxin (LANOXIN) 0.125 MG tablet TAKE 1 TABLET BY MOUTH DAILY 04/24/20 04/24/21  Bensimhon, Bevelyn Buckles, MD  furosemide (LASIX) 20 MG tablet Take 2 tablets (40 mg total) by mouth daily. Take additional 20 mg daily for edema or weight gain of 3 lbs in 1 day or 5 lbs in 2 days. 08/27/20   Angelita Ingles, MD  sacubitril-valsartan (ENTRESTO) 97-103 MG Take 1 tablet by mouth 2 (two) times daily. 05/16/20   Bensimhon, Bevelyn Buckles, MD  spironolactone (ALDACTONE) 25 MG tablet TAKE 1/2 TABLET BY MOUTH DAILY 04/24/20 04/24/21  Bensimhon, Bevelyn Buckles, MD    Allergies  Patient has no known allergies.  Review of Systems   Review of Systems  Constitutional:  Negative for chills and fever.  HENT:  Negative for ear pain and sore throat.   Eyes:  Negative for pain and visual disturbance.  Respiratory:  Positive for shortness of breath. Negative for cough and sputum production.   Cardiovascular:  Positive for leg swelling. Negative for chest pain and palpitations.  Gastrointestinal:  Negative for abdominal pain and vomiting.  Genitourinary:  Negative for dysuria and hematuria.  Musculoskeletal:  Negative for arthralgias and back pain.  Skin:  Negative for color change and rash.  Neurological:  Negative for seizures and syncope.  All other  systems reviewed and are negative.  Physical Exam Updated Vital Signs BP (!) 152/117   Pulse (!) 124   Temp 98 F (36.7 C)   Resp 13   Ht 5\' 11"  (1.803 m)   Wt 133.8 kg   SpO2 99%   BMI 41.14 kg/m   Physical Exam Vitals and nursing note reviewed.  Constitutional:      Appearance: He is well-developed.  HENT:     Head: Normocephalic and atraumatic.  Eyes:     Conjunctiva/sclera: Conjunctivae normal.     Pupils: Pupils are equal, round, and reactive to light.  Cardiovascular:     Rate and Rhythm: Normal rate and regular rhythm.     Pulses: Normal pulses.     Heart sounds: No murmur heard. Pulmonary:     Effort: Tachypnea present. No respiratory distress.     Breath sounds: Decreased breath sounds present.  Abdominal:     Palpations: Abdomen is soft.     Tenderness: There is no abdominal tenderness.  Musculoskeletal:     Cervical back: Neck supple.     Right lower leg: Edema (trace) present.     Left lower leg: Edema (trace) present.  Skin:    General: Skin is warm and dry.     Capillary Refill: Capillary refill takes less than 2 seconds.  Neurological:     General: No focal deficit present.     Mental Status: He is alert.  Psychiatric:        Mood and Affect: Mood normal.    ED Results / Procedures / Treatments   Labs (all labs ordered are listed, but only abnormal results are displayed) Labs Reviewed  BASIC METABOLIC PANEL - Abnormal; Notable for the following components:      Result Value   CO2 21 (*)    Calcium 8.5 (*)    All other components within normal limits  CBC - Abnormal; Notable for the following components:   WBC 10.8 (*)    MCH 25.8 (*)    RDW 16.4 (*)    All other components within normal limits  BRAIN NATRIURETIC PEPTIDE - Abnormal; Notable for the following components:   B Natriuretic Peptide 1,256.1 (*)    All other components within normal limits  TROPONIN I (HIGH SENSITIVITY) - Abnormal; Notable for the following components:    Troponin I (High Sensitivity) 43 (*)    All other components within normal limits  TROPONIN I (HIGH SENSITIVITY) - Abnormal; Notable for the following components:   Troponin I (High Sensitivity) 51 (*)    All other components within normal limits  RESP PANEL BY RT-PCR (FLU A&B, COVID) [id number]    EKG EKG Interpretation  Date/Time:  Sunday December 29 2020 03:32:41 EDT Ventricular Rate:  114 PR Interval:  148 QRS Duration: 98 QT Interval:  342 QTC Calculation: 471 R Axis:   -37 Text Interpretation: Sinus tachycardia with frequent Premature ventricular complexes Possible Left atrial enlargement Left axis deviation Abnormal ECG Confirmed by Virgina Norfolk ([id number]) on 12/29/2020 7:34:04 AM  Radiology DG Chest 2 View  Result Date: 12/29/2020 CLINICAL DATA:  Chest pain EXAM: CHEST - 2 VIEW COMPARISON:  08/15/2020 FINDINGS: Cardiomegaly. Low volume chest. Interstitial coarsening which is chronic. No visible effusion or pneumothorax. IMPRESSION: Chronic cardiomegaly and vascular congestion. Electronically Signed   By: Marnee Spring M.D.   On: 12/29/2020 04:45    Procedures Procedures   Medications Ordered in ED Medications  furosemide (LASIX) injection 80 mg (has no administration in time range)    ED Course  I have reviewed the triage vital signs and the nursing notes.  Pertinent labs & imaging results that were available during my care of the patient were reviewed by me and considered in my medical decision making (see chart for details).    MDM Rules/Calculators/A&P                           Senon Nixon is here with shortness of breath.  Patient tachycardic in the 120s.  EKG shows sinus rhythm with PVCs.  Vital signs otherwise unremarkable.  No fever.  History of heart failure with last echocardiogram showing EF of 20.  States that he takes Lasix as needed.  Started to notice increased shortness of breath and weight gain and took some Lasix the last several days but not much relief.   Cannot walk very far without getting extremely short of breath.  Cannot lie flat without getting short of breath.  Appears tachypneic upon examination with some signs of volume overload.  Fortunately he is on room air.  Lab work has already been drawn that shows mild troponin leak and BNP around 1300.  Chest x-ray consistent with volume overload.  Overall seems like he would benefit from an observation stay for aggressive diuresis given his symptoms, weight gain, lab work.  Not sure he is compliant with his medications or he needs med adjustment.  We will admit to medicine for further care.  This chart was dictated using voice recognition software.  Despite best efforts to proofread,  errors can occur which can change the documentation meaning.   Final Clinical Impression(s) / ED Diagnoses Final diagnoses:  Acute on chronic heart failure, unspecified heart failure type [facility])    Rx / DC Orders ED Discharge Orders     None        Virgina Norfolk, DO 12/29/20 [phone]

## 2020-12-29 NOTE — Plan of Care (Signed)
Pt admitted to Room 20 for Acute on Chronic Heart Failure with plans for diuresis. PT is neuro intact on RA reports no sob currently. Tolerating meals well. LBM 12pm today. No issues voiding. L PIV intact and patent. Skin assessment with Purvis Kilts RN. Unit policies reviewed. Call bell in hand. Fall precautions in place.   Problem: Education: Goal: Ability to demonstrate management of disease process will improve Outcome: Progressing Goal: Ability to verbalize understanding of medication therapies will improve Outcome: Progressing Goal: Individualized Educational Video(s) Outcome: Progressing   Problem: Activity: Goal: Capacity to carry out activities will improve Outcome: Progressing   Problem: Cardiac: Goal: Ability to achieve and maintain adequate cardiopulmonary perfusion will improve Outcome: Progressing   Problem: Education: Goal: Knowledge of General Education information will improve Description: Including pain rating scale, medication(s)/side effects and non-pharmacologic comfort measures Outcome: Progressing   Problem: Health Behavior/Discharge Planning: Goal: Ability to manage health-related needs will improve Outcome: Progressing   Problem: Clinical Measurements: Goal: Ability to maintain clinical measurements within normal limits will improve Outcome: Progressing Goal: Will remain free from infection Outcome: Progressing Goal: Diagnostic test results will improve Outcome: Progressing Goal: Respiratory complications will improve Outcome: Progressing Goal: Cardiovascular complication will be avoided Outcome: Progressing   Problem: Activity: Goal: Risk for activity intolerance will decrease Outcome: Progressing   Problem: Nutrition: Goal: Adequate nutrition will be maintained Outcome: Progressing   Problem: Coping: Goal: Level of anxiety will decrease Outcome: Progressing   Problem: Elimination: Goal: Will not experience complications related to  bowel motility Outcome: Progressing Goal: Will not experience complications related to urinary retention Outcome: Progressing   Problem: Pain Managment: Goal: General experience of comfort will improve Outcome: Progressing   Problem: Safety: Goal: Ability to remain free from injury will improve Outcome: Progressing   Problem: Skin Integrity: Goal: Risk for impaired skin integrity will decrease Outcome: Progressing

## 2020-12-29 NOTE — ED Provider Notes (Signed)
Emergency Medicine Provider Triage Evaluation Note  Donald Murray , a 35 y.o. male  was evaluated in triage.  Pt complains of SOB.  Has hx of CHF.  States that this feels similar.  Has been gradually worsening over the past 3 days.  Denies fever or productive cough. Has been taking lasix without much relief.  Review of Systems  Positive: SOB, sleep difficulty Negative: Cough, fever  Physical Exam  There were no vitals taken for this visit. Gen:   Awake, no distress   Resp:  Normal effort  MSK:   Moves extremities without difficulty  Other:    Medical Decision Making  Medically screening exam initiated at 3:41 AM.  Appropriate orders placed.  Donald Murray was informed that the remainder of the evaluation will be completed by another provider, this initial triage assessment does not replace that evaluation, and the importance of remaining in the ED until their evaluation is complete.  SOB, CHF exacerbation?   Roxy Horseman, PA-C 12/29/20 0342    Nira Conn, MD 12/29/20 [phone]

## 2020-12-29 NOTE — TOC Initial Note (Signed)
Transition of Care [facility]) - Initial/Assessment Note    Patient Details  Name: Donald Murray MRN: [id number] Date of Birth: 10-11-1985  Transition of Care [facility] CM/SW Contact:    Lockie Pares, RN Phone Number: 12/29/2020, 10:41 AM  Clinical Narrative:                  Patient with known history of CHF inwith SHOB. EF 20% has no PCP listed. Needs follow up with heart failure clinic, sent to [facility] for medicaid., however patient does list a job in employment. CM will follow for needs, recommend PCP [facility], placed in patient instructions.    Barriers to Discharge: Continued Medical Work up   Patient Goals and CMS Choice        Expected Discharge Plan and Services     Discharge Planning Services: CM Consult   Living arrangements for the past 2 months: Single Family Home                                      Prior Living Arrangements/Services Living arrangements for the past 2 months: Single Family Home   Patient language and need for interpreter reviewed:: Yes        Need for Family Participation in Patient Care: Yes (Comment) Care giver support system in place?: Yes (comment)   Criminal Activity/Legal Involvement Pertinent to Current Situation/Hospitalization: No - Comment as needed  Activities of Daily Living      Permission Sought/Granted                  Emotional Assessment       Orientation: : Oriented to Self, Oriented to Place, Oriented to  Time   Psych Involvement: No (comment)  Admission diagnosis:  Acute on chronic heart failure (HCC) [I50.9] Patient Active Problem List   Diagnosis Date Noted   Acute on chronic heart failure (HCC) 12/29/2020   PVC (premature ventricular contraction) 08/15/2020   Essential hypertension 03/20/2020   Acute congestive heart failure (HCC)    Cellulitis 03/04/2019   IVDU (intravenous drug user) 03/04/2019   PCP:  Patient, [name] (Inactive) Pharmacy:   [facility] [id number] [place] [address] Phone: [phone] Fax: [phone]     Social Determinants of Health (SDOH) Interventions    Readmission Risk Interventions No flowsheet data found.

## 2020-12-29 NOTE — ED Triage Notes (Signed)
Pt reported to ED with c/o SOB x Thursday. Reports hx of CHF and states he has been compliant with medication regimen. Pt Denies CP at this time.

## 2020-12-30 ENCOUNTER — Observation Stay (HOSPITAL_BASED_OUTPATIENT_CLINIC_OR_DEPARTMENT_OTHER): Payer: Self-pay

## 2020-12-30 DIAGNOSIS — I5043 Acute on chronic combined systolic (congestive) and diastolic (congestive) heart failure: Secondary | ICD-10-CM

## 2020-12-30 DIAGNOSIS — I5023 Acute on chronic systolic (congestive) heart failure: Secondary | ICD-10-CM

## 2020-12-30 LAB — BASIC METABOLIC PANEL
Anion gap: 10 (ref 5–15)
BUN: 15 mg/dL (ref 6–20)
CO2: 23 mmol/L (ref 22–32)
Calcium: 8.4 mg/dL — ABNORMAL LOW (ref 8.9–10.3)
Chloride: 105 mmol/L (ref 98–111)
Creatinine, Ser: 1.14 mg/dL (ref 0.61–1.24)
GFR, Estimated: >60 mL/min (ref >60)
Glucose, Bld: 133 mg/dL — ABNORMAL HIGH (ref 70–99)
Potassium: 3.1 mmol/L — ABNORMAL LOW (ref 3.5–5.1)
Sodium: 138 mmol/L (ref 135–145)

## 2020-12-30 LAB — CBC
HCT: 47.3 % (ref 39.0–52.0)
Hemoglobin: 15.1 g/dL (ref 13.0–17.0)
MCH: 26.1 pg (ref 26.0–34.0)
MCHC: 31.9 g/dL (ref 30.0–36.0)
MCV: 81.8 fL (ref 80.0–100.0)
Platelets: 290 10*3/uL (ref 150–400)
RBC: 5.78 MIL/uL (ref 4.22–5.81)
RDW: 16.6 % — ABNORMAL HIGH (ref 11.5–15.5)
WBC: 10.4 10*3/uL (ref 4.0–10.5)
nRBC: 0 % (ref 0.0–0.2)

## 2020-12-30 LAB — ECHOCARDIOGRAM COMPLETE
Area-P 1/2: 5.75 cm2
Height: 71 in
S' Lateral: 5.9 cm
Single Plane A4C EF: 27.4 %
Weight: 5246.4 oz

## 2020-12-30 LAB — MAGNESIUM: Magnesium: 1.8 mg/dL (ref 1.7–2.4)

## 2020-12-30 MED ORDER — MAGNESIUM SULFATE IN D5W 1-5 GM/100ML-% IV SOLN
1.0000 g | Freq: Once | INTRAVENOUS | Status: AC
  Administered 2020-12-30: 1 g via INTRAVENOUS
  Filled 2020-12-30: qty 100

## 2020-12-30 MED ORDER — DIGOXIN 125 MCG PO TABS
0.1250 mg | ORAL_TABLET | Freq: Every day | ORAL | Status: DC
  Administered 2020-12-30: 0.125 mg via ORAL
  Filled 2020-12-30: qty 1

## 2020-12-30 MED ORDER — FUROSEMIDE 10 MG/ML IJ SOLN
40.0000 mg | Freq: Once | INTRAMUSCULAR | Status: AC
  Administered 2020-12-30: 40 mg via INTRAVENOUS
  Filled 2020-12-30: qty 4

## 2020-12-30 MED ORDER — POTASSIUM CHLORIDE CRYS ER 20 MEQ PO TBCR
40.0000 meq | EXTENDED_RELEASE_TABLET | Freq: Two times a day (BID) | ORAL | Status: DC
  Administered 2020-12-30: 40 meq via ORAL
  Filled 2020-12-30: qty 2

## 2020-12-30 NOTE — Discharge Summary (Signed)
Name: Donald Murray MRN: [id number] DOB: 02/10/1986 35 y.o. PCP: Patient, [name] (Inactive)  Date of Admission: 12/29/2020  3:28 AM Date of Discharge: 12/30/2020 Attending Physician: Inez Catalina, MD  Discharge Diagnosis: 1. Acute on chronic biventricular heart failure 2. Hypertension 3. Tobacco use disorder  Discharge Medications: Allergies as of 12/30/2020   No Known Allergies      Medication List     TAKE these medications    carvedilol 12.5 MG tablet Commonly known as: COREG Take 1 tablet (12.5 mg total) by mouth 2 (two) times daily with a meal.   Descovy 200-25 MG tablet Generic drug: emtricitabine-tenofovir AF Take 1 tablet by mouth daily.   digoxin 0.125 MG tablet Commonly known as: LANOXIN TAKE 1 TABLET BY MOUTH DAILY What changed: how much to take   Entresto 97-103 MG Generic drug: sacubitril-valsartan Take 1 tablet by mouth 2 (two) times daily.   Farxiga 10 MG Tabs tablet Generic drug: dapagliflozin propanediol Take 1 tablet (10 mg total) by mouth daily before breakfast.   furosemide 20 MG tablet Commonly known as: Lasix Take 2 tablets (40 mg total) by mouth daily. Take additional 20 mg daily for edema or weight gain of 3 lbs in 1 day or 5 lbs in 2 days.   spironolactone 25 MG tablet Commonly known as: ALDACTONE TAKE 1/2 TABLET BY MOUTH DAILY What changed: how much to take        Disposition and follow-up:   Mr.Shahrukh Piscitello was discharged from [facility] in Stable condition.  At the hospital follow up visit please address:  1.  Acute on chronic biventricular heart failure: Euvolemic at discharge. Need to ensure compliance with medications, including daily lasix.  2. Hypertension: Blood pressure controlled during admission. No changes in medication  3. Tobacco use disorder:  Will need continue follow-up conversations regarding cessation  4.  Labs / imaging needed at time of follow-up: BMP  5.  Pending labs/ test  needing follow-up: n/a  Follow-up Appointments:  Follow-up Information     Primary Care at [facility]. Call.   Specialty: Family Medicine Why: call to establish PCP Contact information: [address], [address] [place] [address] [phone]        Angelita Ingles, MD. Schedule an appointment as soon as possible for a visit in 2 week(s).   Specialty: Cardiology Contact information: [address] [place] [address] [phone]                Hospital Course by problem list: 1. Acute on chronic biventricular heart failure: Mr. Oestreich arrived to the ED dyspneic with heart rate in 120's. BNP elevated to 1300 with pulmonary congestion on chest x-ray. While in the ED, he was given IV lasix 80mg . Over the next 24 hours he was net negative 5L with improvement of symptoms. On day of discharge, he felt back close to his baseline. Echo was completed as well, revealing: Left ventricular ejection fraction, by estimation, is 20 to 25%. The left ventricle has severely decreased function. The left ventricle demonstrates global hypokinesis. The left ventricular internal cavity size was severely dilated. There is mild left ventricular hypertrophy. Left ventricular diastolic parameters are consistent with Grade II diastolic dysfunction (pseudonormalization). Right ventricular systolic function is moderately reduced. The right ventricular size is normal.The mitral valve is normal in structure. No evidence of mitral valve regurgitation. No evidence of mitral stenosis. The aortic valve is normal in structure. Aortic valve regurgitation is  not visualized. No aortic stenosis is present.The inferior vena cava is normal in size with greater than 50% respiratory variability, suggesting right atrial pressure of 3 mmHg. Unchanged from previous echo done 07/2020. Most likely etiology for Mr. Torbert heart failure exacerbation multifactorial with medication non-compliance and  increased fluid intake. Instructions were given to decrease daily water intake and take lasix daily.  Discharge Exam:   BP 94/82 (BP Location: Right Arm)   Pulse 83   Temp 97.8 F (36.6 C) (Oral)   Resp 18   Ht 5\' 11"  (1.803 m)   Wt (!) 148.7 kg Comment: scale c  SpO2 97%   BMI 45.73 kg/m  Discharge exam: Physical Exam HENT:     Head: Normocephalic and atraumatic.  Neck:     Vascular: No JVD.  Cardiovascular:     Comments: Heart sounds difficult to hear due to large body habitus. Pulmonary:     Effort: Pulmonary effort is normal.     Breath sounds: Decreased breath sounds present.  Musculoskeletal:     Right lower leg: No edema.     Left lower leg: No edema.  Skin:    General: Skin is warm and dry.  Neurological:     General: No focal deficit present.     Mental Status: He is alert.  Psychiatric:        Attention and Perception: Attention normal.        Behavior: Behavior normal. Behavior is cooperative.     Pertinent Labs, Studies, and Procedures:  CBC Latest Ref Rng & Units 12/30/2020 12/29/2020 08/16/2020  WBC 4.0 - 10.5 K/uL 10.4 10.8(H) 11.1(H)  Hemoglobin 13.0 - 17.0 g/dL 08/18/2020 78.4 69.6  Hematocrit 39.0 - 52.0 % 47.3 45.1 50.3  Platelets 150 - 400 K/uL 290 291 338   BMP Latest Ref Rng & Units 12/30/2020 12/29/2020 08/27/2020  Glucose 70 - 99 mg/dL 10/27/2020) 95 85  BUN 6 - 20 mg/dL 15 14 15   Creatinine 0.61 - 1.24 mg/dL 284(X 3.24  BUN/Creat Ratio 9 - 20 - - -  Sodium 135 - 145 mmol/L 138 138 139  Potassium 3.5 - 5.1 mmol/L 3.1(L) 4.9 4.2  Chloride 98 - 111 mmol/L 105 108 108  CO2 22 - 32 mmol/L 23 21(L) 23  Calcium 8.9 - 10.3 mg/dL 4.01) 0.27) 2.5(D)   Echo 12/30/2020: Left ventricular ejection fraction, by estimation, is 20 to 25%. The left ventricle has severely decreased function. The left ventricle demonstrates global hypokinesis. The left ventricular internal cavity size was severely dilated. There is mild left ventricular hypertrophy. Left ventricular  diastolic parameters are consistent with Grade II diastolic dysfunction (pseudonormalization). Right ventricular systolic function is moderately reduced. The right ventricular size is normal.The mitral valve is normal in structure. No evidence of mitral valve regurgitation. No evidence of mitral stenosis. The aortic valve is normal in structure. Aortic valve regurgitation is not visualized. No aortic stenosis is present.The inferior vena cava is normal in size with greater than 50% respiratory variability, suggesting right atrial pressure of 3 mmHg.  Discharge Instructions: Discharge Instructions     (HEART FAILURE PATIENTS) Call MD:  Anytime you have any of the following symptoms: 1) 3 pound weight gain in 24 hours or 5 pounds in 1 week 2) shortness of breath, with or without a dry hacking cough 3) swelling in the hands, feet or stomach 4) if you have to sleep on extra pillows at night in order to breathe.   Complete by: As directed  Call MD for:  difficulty breathing, headache or visual disturbances   Complete by: As directed    Call MD for:  extreme fatigue   Complete by: As directed    Call MD for:  hives   Complete by: As directed    Call MD for:  persistant dizziness or light-headedness   Complete by: As directed    Call MD for:  persistant nausea and vomiting   Complete by: As directed    Call MD for:  redness, tenderness, or signs of infection (pain, swelling, redness, odor or green/yellow discharge around incision site)   Complete by: As directed    Call MD for:  severe uncontrolled pain   Complete by: As directed    Call MD for:  temperature >100.4   Complete by: As directed    Diet - low sodium heart healthy   Complete by: As directed    Discharge instructions   Complete by: As directed    Mr. Sherburn, I am so glad you are feeling better. You were admitted for a heart failure exacerbation. Moving forward, it will be important to take your medications as prescribed and limit  the amount of water you are taking. Please see the following notes:  Continue taking: Carvedilol 12.5mg  twice daily Entresto (sacubitril-valsartan) 97-103mg  twice daily Farxiga (dapagliflozin) 10mg  daily Spironolactone 12.5mg  daily Digoxin 0.125mg  daily Lasix (furosemide) 40mg  daily - not as needed  - In addition, to reduce your risk of further hospitalizations, make sure you are limiting your fluid intake and how much salt you are eating daily. You should not drink more than 1.5L fluid daily.  Please make sure to follow-up with your cardiologist and primary care physician.  It was a pleasure meeting you, Mr. Rhoads. I hope you stay happy and healthy!  Thanks, , MD   Increase activity slowly   Complete by: As directed      Mr. Luten, I am so glad you are feeling better. You were admitted for a heart failure exacerbation. Moving forward, it will be important to take your medications as prescribed and limit the amount of water you are taking. Please see the following notes:  Continue taking: Carvedilol 12.5mg  twice daily Entresto (sacubitril-valsartan) 97-103mg  twice daily Farxiga (dapagliflozin) 10mg  daily Spironolactone 12.5mg  daily Digoxin 0.125mg  daily Lasix (furosemide) 40mg  daily - not as needed  - In addition, to reduce your risk of further hospitalizations, make sure you are limiting your fluid intake and how much salt you are eating daily. You should not drink more than 1.5L fluid daily.  Please make sure to follow-up with your cardiologist and primary care physician.  It was a pleasure meeting you, Mr. Klem. I hope you stay happy and healthy!  Thanks, Internal Medicine Team  Signed: 05-29-2002, MD 12/30/2020, 1:15 PM   Pager: [phone]

## 2020-12-30 NOTE — Plan of Care (Signed)
  Problem: Skin Integrity: Goal: Risk for impaired skin integrity will decrease Outcome: Completed/Met   Problem: Safety: Goal: Ability to remain free from injury will improve Outcome: Completed/Met   Problem: Pain Managment: Goal: General experience of comfort will improve Outcome: Completed/Met   Problem: Coping: Goal: Level of anxiety will decrease Outcome: Completed/Met   Problem: Nutrition: Goal: Adequate nutrition will be maintained Outcome: Completed/Met   Problem: Activity: Goal: Risk for activity intolerance will decrease Outcome: Completed/Met

## 2020-12-30 NOTE — Progress Notes (Signed)
  Echocardiogram 2D Echocardiogram has been performed.  Donald Murray 12/30/2020, 9:05 AM

## 2021-01-09 NOTE — Progress Notes (Addendum)
ADVANCED HF CLINIC NOTE  Primary Care: Patient, [name] (Inactive) Primary Cardiologist: Olga Millers, MD HF Cardiologist: Dr. Gala Romney  HPI: Donald Murray is a  35 y.o. male with HTN, IVDA (heroin), tobacco use, morbid obesity and systolic HF (onset 9/21) referred by Dr. Jens Som for further management of his HF.   Echo 10/20 EF 60-65%   Admitted to [facility] 9/21 with new onset HF in setting of severe HTN 174/128.Marland Kitchen ECG with sinus tach and frequent PVCs.   Echo 02/16/20: EF 20-25% Moderate RV dysfunction.   Seen by AHF in HF Consultation for the first time 04/24/20. 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 Lasix and Entresto increased. Digoxin added. Zio placed to quantify PVC burden - 4.3%. Zio 12/21 Sinus - average HR 104, 4.3% PVCs.  Clinic visit 12/21 doing well, Entresto was increased.  He no showed his follow up appointment in late February 2022.    Seen in hospital 08/15/20 with medication and dietary non-adherence leading to fluid retention and he was having trouble sleeping, getting short of breath at night and having PND.  He was restarted on GDMT and given IV lasix 60mg  x3 doses responded well and was discharged the following day.    Hospital follow up 4/22 GDMT titrated further with plans to repeat echo, if EF not improved, consider cath.  Admitted 8/22 with A/C CHF, diuresed with IV lasix. Repeat echo with EF 20-25%, RV moderately reduced. Exacerbation attributed to medication non-compliance and increase fluid intake. Discharge weight 327 lbs.  Today he returns for post hospitalization HF follow up. SOB at night, not sleeping well. Dyspnea with increased physical activity, does not do stairs. Denies CP, dizziness, edema. +Orthopnea. Appetite ok. No fever or chills. Does not weigh at home. Still missing medications ~2-3x/week. Works at [employer] and [employer], and has another part time job. Anticipating having insurance next month. Drinks >2L/daily, and eats out at least  once a day. Smoking 1 ppd.  Past Medical History:  Diagnosis Date   CHF (congestive heart failure) (HCC)    Hypertension    Obesity    Snoring    Current Outpatient Medications  Medication Sig Dispense Refill   carvedilol (COREG) 12.5 MG tablet Take 1 tablet (12.5 mg total) by mouth 2 (two) times daily with a meal. 180 tablet 3   dapagliflozin propanediol (FARXIGA) 10 MG TABS tablet Take 1 tablet (10 mg total) by mouth daily before breakfast. 30 tablet 11   DESCOVY 200-25 MG tablet Take 1 tablet by mouth daily.     digoxin (LANOXIN) 0.125 MG tablet TAKE 1 TABLET BY MOUTH DAILY 34 tablet 2   furosemide (LASIX) 20 MG tablet Take 2 tablets (40 mg total) by mouth daily. Take additional 20 mg daily for edema or weight gain of 3 lbs in 1 day or 5 lbs in 2 days. 180 tablet 3   sacubitril-valsartan (ENTRESTO) 97-103 MG Take 1 tablet by mouth 2 (two) times daily. 60 tablet 3   spironolactone (ALDACTONE) 25 MG tablet TAKE 1/2 TABLET BY MOUTH DAILY 17 tablet 2   No current facility-administered medications for this encounter.   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"   Drug use: Not Currently    Comment: Pt stated "It was opiates"   Sexual activity: Not on file  Other Topics Concern   Not on file  Social History Narrative   Not on file   Social Determinants of Health   Financial Resource Strain: Not on file  Food Insecurity: Not on file  Transportation Needs: Not  on file  Physical Activity: Not on file  Stress: Not on file  Social Connections: Not on file  Intimate Partner Violence: Not on file   Family History  Problem Relation Age of Onset   Hypertension Mother    Hypertension Father    BP 112/70   Pulse 89   Wt (!) 151 kg (332 lb 12.8 oz)   SpO2 97%   BMI 46.42 kg/m   Wt Readings from Last 3 Encounters:  01/10/21 (!) 151 kg (332 lb 12.8 oz)  12/30/20 (!) 148.7 kg (327 lb 14.4 oz)  08/27/20 (!) 147.8 kg (325 lb 12.8 oz)   PHYSICAL EXAM: General:  NAD. No resp difficulty HEENT: Normal Neck: Supple. Thick neck, JVP 7-8 Carotids 2+ bilat; no bruits. No lymphadenopathy or thryomegaly appreciated. Cor: PMI nondisplaced. Tachy irregular rate & rhythm. No rubs, gallops or murmurs. Lungs: Clear Abdomen: Obese, nontender, nondistended. No hepatosplenomegaly. No bruits or masses. Good bowel sounds. Extremities: No cyanosis, clubbing, rash, edema Neuro: Alert & oriented x 3, cranial nerves grossly intact. Moves all 4 extremities w/o difficulty. Affect pleasant.  ECG: ST with PVCs (personally reviewed).  ASSESSMENT & PLAN:  1. Chronic systolic HF - Diagnosed 9/21 Echo EF 20-25% RV moderately HK - Suspect HTN vs PVC-mediated (PVC burden 4.3% - probably not high enough to cause CM). - Echo (8/22): EF 20-25%, severe LV dysfunction, Grade II DD, RV moderately reduced - NYHA III symptoms, volume up today. Likely in setting of missing multiple days of medications. - Increase lasix to 80 mg daily x 3 days, then back to 40 mg daily. +40 mEq of KCl. - Increase spiro to 25 mg daily. - Continue Entresto 97/103 mg bid. - Continue digoxin 0.125 mg daily. - Continue carvedilol 12.5 mg bid. - Continue Farxiga 10 mg daily. - Has not been able to be compliant with GDMT, not an ICD candidate. Discussed with Dr. Gala Romney. - Will arrange paramedicine to assist with medication compliance. - Discussed limiting fluids to less than 2 L/day and reducing salt  intake and eating out/take out. - BMET, BNP and digoxin level today, repeat BMET in 10 days.  2. HTN, severe - Well controlled when taking his meds.  3. Frequent PVCs - Zio 12/21 4.3% PVCs - probably not high enough burden to cause CM.  - Needs sleep study - does not have insurance.  4. IVDA (heroin) - Reports no use since 12/20.  5. Tobacco Use - Smoking 1 ppd. - Not ready to quit.  6. Morbid obesity - Needs weight loss. -Body mass index is 46.42 kg/m.  7. Non-compliance: - Working 2 full-time jobs and a part time job. - He was given a pillbox today. - Discussed paramedicine. He is agreeable, will arrange.  Follow up  with APP in 3-4 weeks to reassess volume. Consider switching lasix to torsemide.  Will keep appt with Dr. Gala Romney + echo in 2 months. Needs to stay on his medications.  Anderson Malta Comfort, FNP  01/10/21

## 2021-01-10 ENCOUNTER — Encounter (HOSPITAL_COMMUNITY): Payer: Self-pay

## 2021-01-10 ENCOUNTER — Other Ambulatory Visit (HOSPITAL_COMMUNITY): Payer: Self-pay

## 2021-01-10 ENCOUNTER — Ambulatory Visit (HOSPITAL_COMMUNITY)
Admit: 2021-01-10 | Discharge: 2021-01-10 | Disposition: A | Discharge status: Home / Self Care | Payer: Self-pay | Attending: Family Medicine | Admitting: Family Medicine

## 2021-01-10 ENCOUNTER — Other Ambulatory Visit: Payer: Self-pay

## 2021-01-10 VITALS — BP 112/70 | HR 89 | Wt 332.8 lb

## 2021-01-10 DIAGNOSIS — Z720 Tobacco use: Secondary | ICD-10-CM

## 2021-01-10 DIAGNOSIS — I493 Ventricular premature depolarization: Secondary | ICD-10-CM | POA: Insufficient documentation

## 2021-01-10 DIAGNOSIS — I5022 Chronic systolic (congestive) heart failure: Secondary | ICD-10-CM | POA: Insufficient documentation

## 2021-01-10 DIAGNOSIS — Z79899 Other long term (current) drug therapy: Secondary | ICD-10-CM | POA: Insufficient documentation

## 2021-01-10 DIAGNOSIS — Z9114 Patient's other noncompliance with medication regimen: Secondary | ICD-10-CM | POA: Insufficient documentation

## 2021-01-10 DIAGNOSIS — I420 Dilated cardiomyopathy: Secondary | ICD-10-CM | POA: Insufficient documentation

## 2021-01-10 DIAGNOSIS — Z6841 Body Mass Index (BMI) 40.0 and over, adult: Secondary | ICD-10-CM | POA: Insufficient documentation

## 2021-01-10 DIAGNOSIS — I110 Hypertensive heart disease with heart failure: Secondary | ICD-10-CM | POA: Insufficient documentation

## 2021-01-10 DIAGNOSIS — F1990 Other psychoactive substance use, unspecified, uncomplicated: Secondary | ICD-10-CM

## 2021-01-10 DIAGNOSIS — Z7984 Long term (current) use of oral hypoglycemic drugs: Secondary | ICD-10-CM | POA: Insufficient documentation

## 2021-01-10 DIAGNOSIS — Z8249 Family history of ischemic heart disease and other diseases of the circulatory system: Secondary | ICD-10-CM | POA: Insufficient documentation

## 2021-01-10 DIAGNOSIS — F17210 Nicotine dependence, cigarettes, uncomplicated: Secondary | ICD-10-CM | POA: Insufficient documentation

## 2021-01-10 DIAGNOSIS — Z9119 Patient's noncompliance with other medical treatment and regimen: Secondary | ICD-10-CM | POA: Insufficient documentation

## 2021-01-10 DIAGNOSIS — I10 Essential (primary) hypertension: Secondary | ICD-10-CM

## 2021-01-10 LAB — DIGOXIN LEVEL: Digoxin Level: <0.2 ng/mL — ABNORMAL LOW (ref 0.8–2.0)

## 2021-01-10 LAB — BRAIN NATRIURETIC PEPTIDE: B Natriuretic Peptide: 278 pg/mL — ABNORMAL HIGH (ref 0.0–100.0)

## 2021-01-10 LAB — BASIC METABOLIC PANEL
Anion gap: 6 (ref 5–15)
BUN: 15 mg/dL (ref 6–20)
CO2: 24 mmol/L (ref 22–32)
Calcium: 8.6 mg/dL — ABNORMAL LOW (ref 8.9–10.3)
Chloride: 108 mmol/L (ref 98–111)
Creatinine, Ser: 0.98 mg/dL (ref 0.61–1.24)
GFR, Estimated: >60 mL/min (ref >60)
Glucose, Bld: 114 mg/dL — ABNORMAL HIGH (ref 70–99)
Potassium: 4.1 mmol/L (ref 3.5–5.1)
Sodium: 138 mmol/L (ref 135–145)

## 2021-01-10 MED ORDER — FUROSEMIDE 20 MG PO TABS: ORAL_TABLET | ORAL | 3 refills | Status: DC

## 2021-01-10 MED ORDER — POTASSIUM CHLORIDE CRYS ER 20 MEQ PO TBCR
40.0000 meq | EXTENDED_RELEASE_TABLET | Freq: Every day | ORAL | 3 refills | Status: DC
  Filled 2021-01-10: qty 60, 30d supply, fill #0
  Filled 2021-05-20: qty 60, 30d supply, fill #1

## 2021-01-10 MED ORDER — SPIRONOLACTONE 25 MG PO TABS
ORAL_TABLET | Freq: Every day | ORAL | 2 refills | Status: DC
  Filled 2021-01-10: qty 17, fill #0

## 2021-01-10 MED ORDER — CARVEDILOL 12.5 MG PO TABS
12.5000 mg | ORAL_TABLET | Freq: Two times a day (BID) | ORAL | 3 refills | Status: DC
  Filled 2021-01-10: qty 60, 30d supply, fill #0
  Filled 2021-05-20: qty 60, 30d supply, fill #1

## 2021-01-10 MED ORDER — FUROSEMIDE 20 MG PO TABS
ORAL_TABLET | ORAL | 3 refills | Status: DC
  Filled 2021-01-10: qty 64, 29d supply, fill #0
  Filled 2021-05-20: qty 64, 30d supply, fill #1

## 2021-01-10 MED ORDER — DIGOXIN 125 MCG PO TABS
0.1250 mg | ORAL_TABLET | Freq: Every day | ORAL | 2 refills | Status: DC
  Filled 2021-01-10: qty 30, 30d supply, fill #0
  Filled 2021-05-20: qty 30, 30d supply, fill #1
  Filled 2021-06-30: qty 30, 30d supply, fill #2

## 2021-01-10 MED ORDER — SPIRONOLACTONE 25 MG PO TABS
25.0000 mg | ORAL_TABLET | Freq: Every day | ORAL | 2 refills | Status: DC
  Filled 2021-01-10: qty 30, 30d supply, fill #0
  Filled 2021-05-20: qty 30, 30d supply, fill #1
  Filled 2021-06-30: qty 30, 30d supply, fill #2

## 2021-01-10 NOTE — Patient Instructions (Addendum)
INCREASE Lasix to 80 mg daily for 3 days, then resume normal dose of 40 mg, daily thereafter  INCREASE Spironolactone to 25 mg , one tab daily  START Potassium 40 meq daily  Labs today We will only contact you if something comes back abnormal or we need to make some changes. Otherwise no news is good news!  Labs needed in 7-14 days  Your physician recommends that you schedule a follow-up appointment in: 3-4 weeks  in the Advanced Practitioners (PA/NP) Clinic   Do the following things EVERYDAY: Weigh yourself in the morning before breakfast. Write it down and keep it in a log. Take your medicines as prescribed Eat low salt foods--Limit salt (sodium) to 2000 mg per day.  Stay as active as you can everyday Limit all fluids for the day to less than 2 liters  At the [facility], you and your health needs are our priority. As part of our continuing mission to provide you with exceptional heart care, we have created designated Provider Care Teams. These Care Teams include your primary Cardiologist (physician) and Advanced Practice Providers (APPs- Physician Assistants and Nurse Practitioners) who all work together to provide you with the care you need, when you need it.   You may see any of the following providers on your designated Care Team at your next follow up: Dr Arvilla Meres Dr Marca Ancona Dr Brandon Melnick, NP Robbie Lis, [place] Mikki Santee Karle Plumber, PharmD   Please be sure to bring in all your medications bottles to every appointment.   If you have any questions or concerns before your next appointment please send [place] a message through [place] or call our office at [phone].    TO LEAVE A MESSAGE FOR THE NURSE SELECT OPTION 2, PLEASE LEAVE A MESSAGE INCLUDING: YOUR NAME DATE OF BIRTH CALL BACK NUMBER REASON FOR CALL**this is important as we prioritize the call backs  YOU WILL RECEIVE A CALL BACK THE SAME DAY AS LONG AS  YOU CALL BEFORE 4:00 PM

## 2021-01-10 NOTE — Addendum Note (Signed)
Encounter addended by: Jacklynn Ganong, FNP on: 01/10/2021 3:47 PM  Actions taken: Clinical Note Signed

## 2021-01-14 ENCOUNTER — Telehealth (HOSPITAL_COMMUNITY): Payer: Self-pay

## 2021-01-14 NOTE — Telephone Encounter (Signed)
Attempted to reach pt regarding referral and to sch home visit, no answer, LVM asking for him to return my call.  He does have clinic appointment on Friday.   Will continue to f/u.   Kerry Hough, EMT-Paramedic 01/14/21

## 2021-01-15 ENCOUNTER — Telehealth (HOSPITAL_COMMUNITY): Payer: Self-pay | Admitting: Licensed Clinical Social Worker

## 2021-01-15 NOTE — Telephone Encounter (Signed)
CSW contacted patient to schedule Paramedicine Intake. Message left for return call. Donald Beech, LCSW, CCSW-MCS [phone]

## 2021-01-17 ENCOUNTER — Ambulatory Visit (HOSPITAL_COMMUNITY)
Admission: RE | Admit: 2021-01-17 | Discharge: 2021-01-17 | Disposition: A | Discharge status: Home / Self Care | Payer: Self-pay | Source: Ambulatory Visit | Attending: Cardiology | Admitting: Cardiology

## 2021-01-17 ENCOUNTER — Other Ambulatory Visit: Payer: Self-pay

## 2021-01-17 DIAGNOSIS — I5022 Chronic systolic (congestive) heart failure: Secondary | ICD-10-CM | POA: Insufficient documentation

## 2021-01-17 LAB — BASIC METABOLIC PANEL
Anion gap: 10 (ref 5–15)
BUN: 18 mg/dL (ref 6–20)
CO2: 21 mmol/L — ABNORMAL LOW (ref 22–32)
Calcium: 9.1 mg/dL (ref 8.9–10.3)
Chloride: 108 mmol/L (ref 98–111)
Creatinine, Ser: 1.02 mg/dL (ref 0.61–1.24)
GFR, Estimated: >60 mL/min (ref >60)
Glucose, Bld: 84 mg/dL (ref 70–99)
Potassium: 4.5 mmol/L (ref 3.5–5.1)
Sodium: 139 mmol/L (ref 135–145)

## 2021-01-17 NOTE — Addendum Note (Signed)
Encounter addended by: Marcy Siren, LCSW on: 01/17/2021 2:23 PM  Actions taken: Flowsheet accepted, Pend clinical note, Clinical Note Signed

## 2021-01-17 NOTE — Progress Notes (Signed)
Heart and Vascular Care Navigation  01/17/2021  Donald Murray 14-Jan-1986 [id number]  Reason for Referral: Patient is new to Paramedicine.    Engaged with patient face to face for initial visit for Heart and Vascular Care Coordination.                                                                                                   Assessment:  Patient is a 35 yo male who lives in a boarding home. 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 He shares that he would like to pursue a career as a substance abuse counselor. He is currently working off  some debt and plans to go to school to become a Veterinary surgeon. Patient shared that he hopes with the upcoming open enrollment at [employer] that he might be able to get some insurance. CSW discussed process and to keep the Paramedics/CSW informed of his progress.                                 HRT/VAS Care Coordination     Patients Home Cardiology Office Heart Failure Clinic   Outpatient Care Team Social Worker   Social Worker Name: Lasandra Beech, [place] [phone]   Living arrangements for the past 2 months Boarding House  Recovery Home   Lives with: Self   Patient Current Insurance Coverage Self-Pay   Patient Has Concern With Paying Medical Bills Yes   Medical Bill Referrals: Patient might hvae some options soon at [employer] his employer during open enrollment.   Does Patient Have Prescription Coverage? No   Patient Prescription Assistance Programs Heart Failure [facility] Assistive Devices/Equipment Scales       Social History:                                                                             SDOH Screenings   Alcohol Screen: Not on file  Depression (PHQ2-9): Not on file  Financial Resource Strain: Low Risk    Difficulty of Paying Living Expenses: Not very hard  Food  Insecurity: No Food Insecurity   Worried About Running Out of Food in the Last Year: Never true   Ran Out of Food in the Last Year: Never true  Housing: Low Risk    Last Housing Risk Score: 0  Physical Activity: Not on file  Social Connections: Not on file  Stress: Not on file  Tobacco Use: High Risk   Smoking Tobacco Use: Every Day   Smokeless Tobacco Use: Never  Transportation Needs: No Transportation Needs   Lack  of Transportation (Medical): No   Lack of Transportation (Non-Medical): No    SDOH Interventions: Financial Resources:  Corporate treasurer Interventions: Intervention Not Indicated N/a  Food Insecurity:  Food Insecurity Interventions: Intervention Not Indicated (Patient has food stamps)  Housing Insecurity:  Housing Interventions: Intervention Not Indicated  Transportation:   Transportation Interventions: Intervention Not Indicated    Follow-up plan:  Patient appears to have good support and denies any other SDoH concerns at this time. CSW provided support and explained the Paramedicine Program at length. Patient appears very grateful for the support of the program and will return call if further questions arise regarding insurance assistance. Lasandra Beech, LCSW, CCSW-MCS [phone]

## 2021-01-20 ENCOUNTER — Telehealth (HOSPITAL_COMMUNITY): Payer: Self-pay

## 2021-01-21 ENCOUNTER — Other Ambulatory Visit (HOSPITAL_COMMUNITY): Payer: Self-pay

## 2021-01-21 MED ORDER — ENTRESTO 97-103 MG PO TABS: 1.0000 | ORAL_TABLET | Freq: Two times a day (BID) | ORAL | 1 refills | Status: DC

## 2021-01-21 NOTE — Telephone Encounter (Signed)
Attempted to contact pt regarding home visit, no answer, LVM for him to return my call.   Kerry Hough, EMT-Paramedic  01/21/21

## 2021-01-22 ENCOUNTER — Encounter (HOSPITAL_COMMUNITY): Payer: Self-pay | Admitting: Adult Health

## 2021-01-22 ENCOUNTER — Inpatient Hospital Stay: Payer: Self-pay | Admitting: Family Medicine

## 2021-01-22 NOTE — Progress Notes (Signed)
HF Paramedicine Team Based Care Meeting  [name]  HF NP - Syncere Kaminski NP-C   [facility] HF Paramedicine  [name] SW  Unable to reach will discharge from HF Paramedicine. If he NO SHOWS 02/07/21 may need to discharge.   Hospital admit within the last 30 days for heart failure?   Medications concerns?   Transportation issues ?   Education needs?   SDOH   Eligible for discharge? Pending.   Donald Scripter NP-C  3:45 PM

## 2021-02-06 ENCOUNTER — Telehealth (HOSPITAL_COMMUNITY): Payer: Self-pay

## 2021-02-06 NOTE — Telephone Encounter (Signed)
Called to confirm/remind patient of their appointment at the [facility] on 02/07/21.   Patient reminded to bring all medications and/or complete list.  Confirmed patient has transportation. Gave directions, instructed to utilize valet parking.  Confirmed appointment prior to ending call.

## 2021-02-07 ENCOUNTER — Encounter (HOSPITAL_COMMUNITY): Payer: Self-pay

## 2021-02-10 ENCOUNTER — Telehealth (HOSPITAL_COMMUNITY): Payer: Self-pay

## 2021-02-10 NOTE — Telephone Encounter (Signed)
Documentation--  Pt is being removed from paramedicine due to no contact was able to be made with him after several attempts.  He had confirmed clinic appointment last week but he was no show to that. It was discussed with Amy in prior meeting if he no-showed to clinic appointment on 9/16 he would be d/c.   Kerry Hough, EMT-Paramedic  02/10/21

## 2021-03-07 ENCOUNTER — Ambulatory Visit (HOSPITAL_COMMUNITY): Admission: RE | Admit: 2021-03-07 | Payer: Self-pay | Source: Ambulatory Visit

## 2021-03-07 ENCOUNTER — Encounter (HOSPITAL_COMMUNITY): Payer: Self-pay | Admitting: Internal Medicine

## 2021-05-04 ENCOUNTER — Other Ambulatory Visit: Payer: Self-pay

## 2021-05-04 ENCOUNTER — Emergency Department (HOSPITAL_BASED_OUTPATIENT_CLINIC_OR_DEPARTMENT_OTHER)
Admission: EM | Admit: 2021-05-04 | Discharge: 2021-05-04 | Disposition: A | Discharge status: Home / Self Care | Payer: Self-pay | Attending: Emergency Medicine | Admitting: Emergency Medicine

## 2021-05-04 ENCOUNTER — Emergency Department (HOSPITAL_BASED_OUTPATIENT_CLINIC_OR_DEPARTMENT_OTHER): Payer: Self-pay

## 2021-05-04 ENCOUNTER — Encounter (HOSPITAL_BASED_OUTPATIENT_CLINIC_OR_DEPARTMENT_OTHER): Payer: Self-pay

## 2021-05-04 DIAGNOSIS — I5043 Acute on chronic combined systolic (congestive) and diastolic (congestive) heart failure: Secondary | ICD-10-CM | POA: Insufficient documentation

## 2021-05-04 DIAGNOSIS — Z79899 Other long term (current) drug therapy: Secondary | ICD-10-CM | POA: Insufficient documentation

## 2021-05-04 DIAGNOSIS — B349 Viral infection, unspecified: Secondary | ICD-10-CM

## 2021-05-04 DIAGNOSIS — Z20822 Contact with and (suspected) exposure to covid-19: Secondary | ICD-10-CM | POA: Insufficient documentation

## 2021-05-04 DIAGNOSIS — I110 Hypertensive heart disease with heart failure: Secondary | ICD-10-CM | POA: Insufficient documentation

## 2021-05-04 DIAGNOSIS — F17210 Nicotine dependence, cigarettes, uncomplicated: Secondary | ICD-10-CM | POA: Insufficient documentation

## 2021-05-04 LAB — CBC WITH DIFFERENTIAL/PLATELET
Abs Immature Granulocytes: 0.05 10*3/uL (ref 0.00–0.07)
Basophils Absolute: 0.1 10*3/uL (ref 0.0–0.1)
Basophils Relative: 1 %
Eosinophils Absolute: 0.2 10*3/uL (ref 0.0–0.5)
Eosinophils Relative: 2 %
HCT: 47.4 % (ref 39.0–52.0)
Hemoglobin: 15 g/dL (ref 13.0–17.0)
Immature Granulocytes: 0 %
Lymphocytes Relative: 19 %
Lymphs Abs: 2.1 10*3/uL (ref 0.7–4.0)
MCH: 26.1 pg (ref 26.0–34.0)
MCHC: 31.6 g/dL (ref 30.0–36.0)
MCV: 82.4 fL (ref 80.0–100.0)
Monocytes Absolute: 0.7 10*3/uL (ref 0.1–1.0)
Monocytes Relative: 7 %
Neutro Abs: 8 10*3/uL — ABNORMAL HIGH (ref 1.7–7.7)
Neutrophils Relative %: 71 %
Platelets: 318 10*3/uL (ref 150–400)
RBC: 5.75 MIL/uL (ref 4.22–5.81)
RDW: 18.1 % — ABNORMAL HIGH (ref 11.5–15.5)
WBC: 11.1 10*3/uL — ABNORMAL HIGH (ref 4.0–10.5)
nRBC: 0 % (ref 0.0–0.2)

## 2021-05-04 LAB — BASIC METABOLIC PANEL
Anion gap: 11 (ref 5–15)
BUN: 19 mg/dL (ref 6–20)
CO2: 24 mmol/L (ref 22–32)
Calcium: 9.4 mg/dL (ref 8.9–10.3)
Chloride: 103 mmol/L (ref 98–111)
Creatinine, Ser: 1.1 mg/dL (ref 0.61–1.24)
GFR, Estimated: >60 mL/min (ref >60)
Glucose, Bld: 111 mg/dL — ABNORMAL HIGH (ref 70–99)
Potassium: 3.8 mmol/L (ref 3.5–5.1)
Sodium: 138 mmol/L (ref 135–145)

## 2021-05-04 LAB — BRAIN NATRIURETIC PEPTIDE: B Natriuretic Peptide: 1017.9 pg/mL — ABNORMAL HIGH (ref 0.0–100.0)

## 2021-05-04 LAB — D-DIMER, QUANTITATIVE: D-Dimer, Quant: 0.38 ug/mL-FEU (ref 0.00–0.50)

## 2021-05-04 LAB — TROPONIN I (HIGH SENSITIVITY): Troponin I (High Sensitivity): 41 ng/L — ABNORMAL HIGH (ref <18)

## 2021-05-04 LAB — RESP PANEL BY RT-PCR (FLU A&B, COVID) ARPGX2
Influenza A by PCR: NEGATIVE
Influenza B by PCR: NEGATIVE
SARS Coronavirus 2 by RT PCR: NEGATIVE

## 2021-05-04 MED ORDER — FUROSEMIDE 10 MG/ML IJ SOLN
80.0000 mg | Freq: Once | INTRAMUSCULAR | Status: AC
  Administered 2021-05-04: 80 mg via INTRAVENOUS
  Filled 2021-05-04: qty 8

## 2021-05-04 NOTE — Discharge Instructions (Signed)
You are experiencing a flareup of your congestive heart failure in the setting of a virus.  I recommended for the next 7 days that you double your morning Lasix dose from 40 mg to 80 mg.  This will help pull more water from your lungs.  After that you should be able to go back to regular dose.  However it is important that you call your cardiologist or your doctor, whoever manages your heart failure, to ask for a close follow-up appointment.  You may need another picture of your heart (echocardiogram).

## 2021-05-04 NOTE — ED Triage Notes (Signed)
Has been sick for the last 4 days, throat hurts, head hurts, ears hurt, possibly running a fever

## 2021-05-04 NOTE — ED Triage Notes (Signed)
Woke up twice tonight gasping for breath. Hx of CHF. Also was sweating when he woke up.

## 2021-05-04 NOTE — ED Provider Notes (Signed)
[facility] EMERGENCY DEPT Provider Note   CSN: [id number] Arrival date & time: 05/04/21  0446     History Chief Complaint  Patient presents with   Shortness of Breath    Donald Murray is a 35 y.o. male.  The history is provided by the patient.  Shortness of Breath He has history of hypertension, heart failure and comes in complaining of shortness of breath and cough for the last 3 days.  Cough has occasionally been productive of some blood-tinged sputum.  He denies chest pain, heaviness, tightness, pressure.  He denies fever or chills but has had sweats.  There has been no nausea or vomiting.  He is not aware of any sick contacts, but does work in a public environment.  He has been vaccinated against COVID-19 but has not had influenza vaccination this year.  He was worried that his shortness of breath may be related to his heart failure so he doubled his dose of furosemide for the last 3 days, but has not noted any increase in urine output.   Past Medical History:  Diagnosis Date   CHF (congestive heart failure) (HCC)    Hypertension    Obesity    Snoring     Patient Active Problem List   Diagnosis Date Noted   Acute on chronic heart failure (HCC) 12/29/2020   PVC (premature ventricular contraction) 08/15/2020   Essential hypertension 03/20/2020   Acute congestive heart failure (HCC)    Cellulitis 03/04/2019   IVDU (intravenous drug user) 03/04/2019    Past Surgical History:  Procedure Laterality Date   I & D EXTREMITY Right 03/04/2019   Procedure: IRRIGATION AND DEBRIDEMENT OF RIGHT ELBOW;  Surgeon: Dominica Severin, MD;  Location: [facility] OR;  Service: Orthopedics;  Laterality: Right;   IRRIGATION AND DEBRIDEMENT ABSCESS Left 03/04/2019   Procedure: Irrigation And Debridement Abscess Left hand;  Surgeon: Dominica Severin, MD;  Location: [facility] OR;  Service: Orthopedics;  Laterality: Left;   UMBILICAL HERNIA REPAIR         Family History  Problem Relation Age  of Onset   Hypertension Mother    Hypertension Father     Social History   Tobacco Use   Smoking status: Every Day    Packs/day: 1.00    Years: 16.00    Pack years: 16.00    Types: Cigarettes   Smokeless tobacco: Never  Vaping Use   Vaping Use: Some days   Substances: Nicotine  Substance Use Topics   Alcohol use: Not Currently    Comment: Pt stated "2 years clean"   Drug use: Not Currently    Comment: Pt stated "It was opiates"    Home Medications Prior to Admission medications   Medication Sig Start Date End Date Taking? Authorizing Provider  carvedilol (COREG) 12.5 MG tablet Take 1 tablet (12.5 mg total) by mouth 2 (two) times daily with a meal. 01/10/21   [place]ford, Anderson Malta, FNP  dapagliflozin propanediol (FARXIGA) 10 MG TABS tablet Take 1 tablet (10 mg total) by mouth daily before breakfast. 08/27/20   Angelita Ingles, MD  DESCOVY 200-25 MG tablet Take 1 tablet by mouth daily. 11/29/20   [provider]  digoxin (LANOXIN) 0.125 MG tablet Take 1 tablet (0.125 mg total) by mouth daily. 01/10/21   Milford, Anderson Malta, FNP  furosemide (LASIX) 20 MG tablet Take 4 tablets (80 mg total) by mouth daily for 3 days, THEN 2 tablets (40 mg total) daily. 01/10/21 02/12/21  Jacklynn Ganong, FNP  potassium chloride SA (KLOR-CON) 20 MEQ tablet Take 2 tablets (40 mEq total) by mouth daily. 01/10/21   [place]ford, Anderson Malta, FNP  sacubitril-valsartan (ENTRESTO) 97-103 MG Take 1 tablet by mouth 2 (two) times daily. 01/21/21   Bensimhon, Bevelyn Buckles, MD  spironolactone (ALDACTONE) 25 MG tablet Take 1 tablet (25 mg total) by mouth daily. 01/10/21   Jacklynn Ganong, FNP    Allergies    Patient has no known allergies.  Review of Systems   Review of Systems  Respiratory:  Positive for shortness of breath.   All other systems reviewed and are negative.  Physical Exam Updated Vital Signs BP (!) 158/115 (BP Location: Right Arm)   Pulse (!) 134   Temp 98.2 F (36.8 C) (Oral)   Resp (!) 24    Ht 5\' 11"  (1.803 m)   Wt (!) 140.6 kg   SpO2 99%   BMI 43.24 kg/m   Physical Exam Vitals and nursing note reviewed.  35 year old male, resting comfortably and in no acute distress. Vital signs are significant for elevated heart rate during, respiratory rate, blood pressure. Oxygen saturation is 99%, which is normal. Head is normocephalic and atraumatic. PERRLA, EOMI. Oropharynx is clear. Neck is nontender and supple without adenopathy or JVD. Back is nontender and there is no CVA tenderness.  There is no presacral edema. Lungs have fine bibasilar rales without wheezes or rhonchi.  There is no prolonged cessation of exhalation phase. Chest is nontender. Heart has regular rate and rhythm without murmur. Abdomen is soft, flat, nontender. Extremities have 1+ edema, full range of motion is present. Skin is warm and dry without rash. Neurologic: Mental status is normal, cranial nerves are intact, moves all extremities equally.  ED Results / Procedures / Treatments   Labs (all labs ordered are listed, but only abnormal results are displayed) Labs Reviewed - No data to display  EKG None  Radiology No results found.  Procedures Procedures   Medications Ordered in ED Medications - No data to display  ED Course  I have reviewed the triage vital signs and the nursing notes.  Pertinent labs & imaging results that were available during my care of the patient were reviewed by me and considered in my medical decision making (see chart for details).  Clinical Course as of 05/04/21 0715  14/11/22 May 04, 2021  May 06, 2021 Pt signed out to me by Dr [id number], briefly 35 yo male here with SOB and suspected CHF exacerbation.  BNP doubled here.  Received IV lasix here.  Trop at baseline.  No hypoxia.  Ddimer negative. [MT]    Clinical Course User Index [MT] 20, MD   MDM Rules/Calculators/A&P                         Cough, chills, dyspnea concerning for respiratory infection such as  influenza or COVID-19.  Will check chest x-ray to rule out pneumonia.  Also, possible heart failure exacerbation.  No bronchospasm noted on exam.  Old records are reviewed showing recent echocardiogram with evidence of both systolic and diastolic heart failure.  Also, with tachycardia and hemoptysis, will need to check D-dimer to rule out pulmonary embolism.  Labs show mildly elevated troponin which he has had in the past and today's value is similar to what it has been in the past.  BNP is significantly elevated compared with baseline consistent with heart failure exacerbation.  Chest x-ray is read by radiologist as  not showing any acute process.  D-dimer is normal.  Respiratory pathogen panel is negative.  At this point, symptoms seem to be heart failure exacerbation.  He is given a dose of furosemide and will need to be observed for therapeutic response at which time decision can be made whether to treat him as an inpatient or outpatient.  Case is signed out to Dr. Renaye Rakers.  Final Clinical Impression(s) / ED Diagnoses Final diagnoses:  Acute on chronic combined systolic and diastolic congestive heart failure [facility])    Rx / DC Orders ED Discharge Orders     None        Dione Booze, MD 05/04/21 [phone]

## 2021-05-04 NOTE — ED Provider Notes (Signed)
Clinical Course as of 05/04/21 8416  Wynelle Link May 04, 2021  6063 Pt signed out to me by Dr Preston Fleeting, briefly 35 yo male here with SOB and suspected CHF exacerbation.  BNP doubled here.  Received IV lasix here.  Trop at baseline.  No hypoxia.  Ddimer negative. [MT]    Clinical Course User Index [MT] Desi Rowe, Kermit Balo, MD   I reassessed the patient.  He remained stable on room air.  He has put out approximately 3 urinals full of urine, feels it is breathing better.  It appears from his history that he likely had a recent viral URI which triggered some congestive heart failure.  However with his adequate urine output and his breathing being more comfortable, I do think home management would be reasonable.  I advised for the next 7 days to double his Lasix dose from 40 mg to 80 mg in the morning.  He can follow-up with his doctor for this.  We also discussed return precautions if his breathing gets worse at home, explained he may need further diuresis and management in the hospital.  He verbalized understanding.   Terald Sleeper, MD 05/04/21 [phone]

## 2021-05-20 ENCOUNTER — Other Ambulatory Visit (HOSPITAL_COMMUNITY): Payer: Self-pay

## 2021-05-20 ENCOUNTER — Other Ambulatory Visit (HOSPITAL_COMMUNITY): Payer: Self-pay | Admitting: *Deleted

## 2021-05-20 DIAGNOSIS — I5022 Chronic systolic (congestive) heart failure: Secondary | ICD-10-CM

## 2021-05-21 ENCOUNTER — Other Ambulatory Visit: Payer: Self-pay

## 2021-05-21 ENCOUNTER — Emergency Department (HOSPITAL_BASED_OUTPATIENT_CLINIC_OR_DEPARTMENT_OTHER): Payer: Self-pay

## 2021-05-21 ENCOUNTER — Inpatient Hospital Stay (HOSPITAL_BASED_OUTPATIENT_CLINIC_OR_DEPARTMENT_OTHER)
Admission: EM | Admit: 2021-05-21 | Discharge: 2021-05-23 | DRG: 291 | Disposition: A | Discharge status: Home / Self Care | Payer: Self-pay | Attending: Cardiology | Admitting: Cardiology

## 2021-05-21 ENCOUNTER — Encounter (HOSPITAL_BASED_OUTPATIENT_CLINIC_OR_DEPARTMENT_OTHER): Payer: Self-pay | Admitting: *Deleted

## 2021-05-21 DIAGNOSIS — I10 Essential (primary) hypertension: Secondary | ICD-10-CM

## 2021-05-21 DIAGNOSIS — Z6841 Body Mass Index (BMI) 40.0 and over, adult: Secondary | ICD-10-CM

## 2021-05-21 DIAGNOSIS — I5023 Acute on chronic systolic (congestive) heart failure: Secondary | ICD-10-CM | POA: Diagnosis present

## 2021-05-21 DIAGNOSIS — F17210 Nicotine dependence, cigarettes, uncomplicated: Secondary | ICD-10-CM | POA: Diagnosis present

## 2021-05-21 DIAGNOSIS — Z9114 Patient's other noncompliance with medication regimen: Secondary | ICD-10-CM

## 2021-05-21 DIAGNOSIS — I509 Heart failure, unspecified: Secondary | ICD-10-CM

## 2021-05-21 DIAGNOSIS — I493 Ventricular premature depolarization: Secondary | ICD-10-CM | POA: Diagnosis present

## 2021-05-21 DIAGNOSIS — I420 Dilated cardiomyopathy: Secondary | ICD-10-CM

## 2021-05-21 DIAGNOSIS — Z20822 Contact with and (suspected) exposure to covid-19: Secondary | ICD-10-CM | POA: Diagnosis present

## 2021-05-21 DIAGNOSIS — Z8249 Family history of ischemic heart disease and other diseases of the circulatory system: Secondary | ICD-10-CM

## 2021-05-21 DIAGNOSIS — Z91199 Patient's noncompliance with other medical treatment and regimen due to unspecified reason: Secondary | ICD-10-CM

## 2021-05-21 DIAGNOSIS — I110 Hypertensive heart disease with heart failure: Principal | ICD-10-CM | POA: Diagnosis present

## 2021-05-21 DIAGNOSIS — I5022 Chronic systolic (congestive) heart failure: Secondary | ICD-10-CM

## 2021-05-21 DIAGNOSIS — G4733 Obstructive sleep apnea (adult) (pediatric): Secondary | ICD-10-CM | POA: Diagnosis present

## 2021-05-21 DIAGNOSIS — Z79899 Other long term (current) drug therapy: Secondary | ICD-10-CM

## 2021-05-21 LAB — CBC
HCT: 44.2 % (ref 39.0–52.0)
Hemoglobin: 13.9 g/dL (ref 13.0–17.0)
MCH: 26.8 pg (ref 26.0–34.0)
MCHC: 31.4 g/dL (ref 30.0–36.0)
MCV: 85.3 fL (ref 80.0–100.0)
Platelets: 286 10*3/uL (ref 150–400)
RBC: 5.18 MIL/uL (ref 4.22–5.81)
RDW: 17.9 % — ABNORMAL HIGH (ref 11.5–15.5)
WBC: 10.2 10*3/uL (ref 4.0–10.5)
nRBC: 0 % (ref 0.0–0.2)

## 2021-05-21 LAB — COMPREHENSIVE METABOLIC PANEL
ALT: 28 U/L (ref 0–44)
AST: 25 U/L (ref 15–41)
Albumin: 3.7 g/dL (ref 3.5–5.0)
Alkaline Phosphatase: 70 U/L (ref 38–126)
Anion gap: 10 (ref 5–15)
BUN: 20 mg/dL (ref 6–20)
CO2: 23 mmol/L (ref 22–32)
Calcium: 8.9 mg/dL (ref 8.9–10.3)
Chloride: 105 mmol/L (ref 98–111)
Creatinine, Ser: 1.17 mg/dL (ref 0.61–1.24)
GFR, Estimated: >60 mL/min (ref >60)
Glucose, Bld: 98 mg/dL (ref 70–99)
Potassium: 3.9 mmol/L (ref 3.5–5.1)
Sodium: 138 mmol/L (ref 135–145)
Total Bilirubin: 0.4 mg/dL (ref 0.3–1.2)
Total Protein: 6.7 g/dL (ref 6.5–8.1)

## 2021-05-21 LAB — DIGOXIN LEVEL: Digoxin Level: <0.2 ng/mL — ABNORMAL LOW (ref 0.8–2.0)

## 2021-05-21 LAB — CBC WITH DIFFERENTIAL/PLATELET
Abs Immature Granulocytes: 0.05 10*3/uL (ref 0.00–0.07)
Basophils Absolute: 0.1 10*3/uL (ref 0.0–0.1)
Basophils Relative: 1 %
Eosinophils Absolute: 0.2 10*3/uL (ref 0.0–0.5)
Eosinophils Relative: 2 %
HCT: 43.2 % (ref 39.0–52.0)
Hemoglobin: 13.8 g/dL (ref 13.0–17.0)
Immature Granulocytes: 1 %
Lymphocytes Relative: 16 %
Lymphs Abs: 1.6 10*3/uL (ref 0.7–4.0)
MCH: 26.7 pg (ref 26.0–34.0)
MCHC: 31.9 g/dL (ref 30.0–36.0)
MCV: 83.7 fL (ref 80.0–100.0)
Monocytes Absolute: 0.7 10*3/uL (ref 0.1–1.0)
Monocytes Relative: 7 %
Neutro Abs: 7.7 10*3/uL (ref 1.7–7.7)
Neutrophils Relative %: 73 %
Platelets: 295 10*3/uL (ref 150–400)
RBC: 5.16 MIL/uL (ref 4.22–5.81)
RDW: 18.2 % — ABNORMAL HIGH (ref 11.5–15.5)
WBC: 10.3 10*3/uL (ref 4.0–10.5)
nRBC: 0 % (ref 0.0–0.2)

## 2021-05-21 LAB — RESP PANEL BY RT-PCR (FLU A&B, COVID) ARPGX2
Influenza A by PCR: NEGATIVE
Influenza B by PCR: NEGATIVE
SARS Coronavirus 2 by RT PCR: NEGATIVE

## 2021-05-21 LAB — TROPONIN I (HIGH SENSITIVITY)
Troponin I (High Sensitivity): 35 ng/L — ABNORMAL HIGH (ref <18)
Troponin I (High Sensitivity): 30 ng/L — ABNORMAL HIGH (ref <18)

## 2021-05-21 LAB — CBG MONITORING, ED: Glucose-Capillary: 113 mg/dL — ABNORMAL HIGH (ref 70–99)

## 2021-05-21 LAB — CREATININE, SERUM
Creatinine, Ser: 1.19 mg/dL (ref 0.61–1.24)
GFR, Estimated: >60 mL/min (ref >60)

## 2021-05-21 LAB — BRAIN NATRIURETIC PEPTIDE: B Natriuretic Peptide: 1141.2 pg/mL — ABNORMAL HIGH (ref 0.0–100.0)

## 2021-05-21 MED ORDER — FUROSEMIDE 10 MG/ML IJ SOLN
40.0000 mg | Freq: Once | INTRAMUSCULAR | Status: AC
  Administered 2021-05-21: 07:00:00 40 mg via INTRAVENOUS
  Filled 2021-05-21: qty 4

## 2021-05-21 MED ORDER — SPIRONOLACTONE 25 MG PO TABS
25.0000 mg | ORAL_TABLET | Freq: Every day | ORAL | Status: DC
  Administered 2021-05-21 – 2021-05-23 (×3): 25 mg via ORAL
  Filled 2021-05-21 (×3): qty 1

## 2021-05-21 MED ORDER — ACETAMINOPHEN 325 MG PO TABS: 650.0000 mg | ORAL_TABLET | ORAL | Status: DC | PRN

## 2021-05-21 MED ORDER — POTASSIUM CHLORIDE CRYS ER 20 MEQ PO TBCR
40.0000 meq | EXTENDED_RELEASE_TABLET | Freq: Two times a day (BID) | ORAL | Status: AC
  Administered 2021-05-21 (×2): 40 meq via ORAL
  Filled 2021-05-21 (×2): qty 2

## 2021-05-21 MED ORDER — ENOXAPARIN SODIUM 80 MG/0.8ML IJ SOSY
80.0000 mg | PREFILLED_SYRINGE | INTRAMUSCULAR | Status: DC
  Administered 2021-05-22: 17:00:00 80 mg via SUBCUTANEOUS
  Filled 2021-05-21 (×2): qty 0.8

## 2021-05-21 MED ORDER — ONDANSETRON HCL 4 MG/2ML IJ SOLN: 4.0000 mg | Freq: Four times a day (QID) | INTRAMUSCULAR | Status: DC | PRN

## 2021-05-21 MED ORDER — SODIUM CHLORIDE 0.9 % IV SOLN: 250.0000 mL | INTRAVENOUS | Status: DC | PRN

## 2021-05-21 MED ORDER — DAPAGLIFLOZIN PROPANEDIOL 10 MG PO TABS
10.0000 mg | ORAL_TABLET | Freq: Every day | ORAL | Status: DC
  Administered 2021-05-21 – 2021-05-23 (×3): 10 mg via ORAL
  Filled 2021-05-21 (×3): qty 1

## 2021-05-21 MED ORDER — DIGOXIN 125 MCG PO TABS
0.1250 mg | ORAL_TABLET | Freq: Every day | ORAL | Status: DC
  Administered 2021-05-21 – 2021-05-23 (×3): 0.125 mg via ORAL
  Filled 2021-05-21 (×3): qty 1

## 2021-05-21 MED ORDER — ENOXAPARIN SODIUM 40 MG/0.4ML IJ SOSY: 40.0000 mg | PREFILLED_SYRINGE | INTRAMUSCULAR | Status: DC

## 2021-05-21 MED ORDER — SODIUM CHLORIDE 0.9% FLUSH: 3.0000 mL | INTRAVENOUS | Status: DC | PRN

## 2021-05-21 MED ORDER — SODIUM CHLORIDE 0.9% FLUSH
3.0000 mL | Freq: Two times a day (BID) | INTRAVENOUS | Status: DC
  Administered 2021-05-21 – 2021-05-22 (×3): 3 mL via INTRAVENOUS

## 2021-05-21 MED ORDER — FUROSEMIDE 10 MG/ML IJ SOLN
80.0000 mg | Freq: Two times a day (BID) | INTRAMUSCULAR | Status: DC
  Administered 2021-05-21 – 2021-05-23 (×5): 80 mg via INTRAVENOUS
  Filled 2021-05-21 (×5): qty 8

## 2021-05-21 NOTE — H&P (Addendum)
Advanced Heart Failure Team History and Physical Note   PCP:  Pcp, No  PCP-Cardiology: Olga Millers, MD HF Cardiologist: Dr. Gala Romney      Reason for Admission: Acute on chronic systolic CHF     HPI:     Donald Murray is a  35 y.o. male with HTN, IVDA (heroin), tobacco use, morbid obesity and systolic HF (onset 9/21) referred by Dr. Jens Som for further management of his HF.    Echo 10/20 EF 60-65%    Admitted to [facility] 9/21 with new onset HF in setting of severe HTN 174/128. ECG with sinus tach and frequent PVCs.    Echo 02/16/20: EF 20-25% Moderate RV dysfunction.    Seen by AHF in HF Consultation for the first time 04/24/20. 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 Lasix and Entresto increased. Digoxin added. Zio placed to quantify PVC burden - 4.3%. Zio 12/21 Sinus - average HR 104, 4.3% PVCs.   Clinic visit 12/21 doing well, Entresto was increased.  He no showed his follow up appointment in late February 2022.     Seen in hospital 08/15/20 with medication and dietary non-adherence leading to fluid retention and he was having trouble sleeping, getting short of breath at night and having PND.  He was restarted on GDMT and given IV lasix 60mg  x3 doses responded well and was discharged the following day.     Hospital follow up 4/22 GDMT titrated further with plans to repeat echo, if EF not improved, consider cath.   Admitted 8/22 with A/C CHF, diuresed with IV lasix. Repeat echo with EF 20-25%, RV moderately reduced. Exacerbation attributed to medication non-compliance and increase fluid intake. Discharge weight 327 lbs.   Last seen in clinic for follow-up in 08/22. Had not been adherent with medications. Appeared volume up. Referred for paramedicine but discharged after unable to contact him. No showed his follow-up in HF clinic 09/22.    Seen in ED at [facility] on 05/04/21 with shortness of breath and cough. Felt to be volume overloaded after suspected viral illness. Given IV lasix  and home furosemide increased from 40 mg to 80 mg daily X 7 days.    Presented to ED at [facility] again early this am with worsening dyspnea X 3 weeks. Had remained on increased dose of his furosemide at 40 mg BID at home. Continued with cough and severe orthopnea despite this. Digoxin level < 0.2, BNP 1,141, HS troponin 35>30 (chronically elevated), Scr 1.17, CO2 23. Negative for COVID and influenza. Chest x-ray with low lung volumes with cardiomegaly and pulmonary venous congestion. Appeared volume overloaded and was given IV lasix 40 mg.  BP soft. Cardiology contacted. ED to ED transfer recommended for evaluation by Advanced Heart Failure.    SBP 90s-low 100s. Rhythm sinus tach low 100s with PVCs.   Reports one unmeasured void prior to transfer here. Has urinated several times since arrival here, once 450 cc followed by very small amounts. Unable to lie flat for physical exam.   Reports adherence with medications. Following sodium restriction but admits to trouble restricting fluid intake.    Continues to work as a [employer] at [employer], [employer]tele[employer] and [employer].  Denies recent alcohol or drug use. Smokes 1 pack of cigarettes every 4-5 days.       Review of Systems: [y] = yes, [ ]  = no  General: Weight gain [Y]; Weight loss [ ] ; Anorexia [ ] ; Fatigue [ ] ; Fever [ ] ; Chills [ ] ; Weakness [ ]   Cardiac: Chest pain/pressure [ ] ; Resting SOB [Y]; Exertional SOB [Y]; Orthopnea [Y]; Pedal Edema [ ] ; Palpitations [ ] ; Syncope [ ] ; Presyncope [ ] ; Paroxysmal nocturnal dyspnea[Y]  Pulmonary: Cough [ ] ; Wheezing[ ] ; Hemoptysis[ ] ; Sputum [ ] ; Snoring [ ]   GI: Vomiting[ ] ; Dysphagia[ ] ; Melena[ ] ; Hematochezia [ ] ; Heartburn[ ] ; Abdominal pain [ ] ; Constipation [ ] ; Diarrhea [ ] ; BRBPR [ ]   GU: Hematuria[ ] ; Dysuria [ ] ; Nocturia[ ]   Vascular: Pain in legs with walking [ ] ; Pain in feet with lying flat [ ] ; Non-healing sores [ ] ; Stroke [ ] ; TIA [ ] ; Slurred speech [ ] ;  Neuro: Headaches[ ] ; Vertigo[ ] ;  Seizures[ ] ; Paresthesias[ ] ;Blurred vision [ ] ; Diplopia [ ] ; Vision changes [ ]   Ortho/Skin: Arthritis [ ] ; Joint pain [ ] ; Muscle pain [ ] ; Joint swelling [ ] ; Back Pain [ ] ; Rash [ ]   Psych: Depression[ ] ; Anxiety[ ]   Heme: Bleeding problems [ ] ; Clotting disorders [ ] ; Anemia [ ]   Endocrine: Diabetes [ ] ; Thyroid dysfunction[ ]    Home Medications        Prior to Admission medications   Medication Sig Start Date End Date Taking? Authorizing Provider  carvedilol (COREG) 12.5 MG tablet Take 1 tablet (12.5 mg total) by mouth 2 (two) times daily with a meal. 01/10/21   Yes Milford, Maricela Bo, FNP  dapagliflozin propanediol (FARXIGA) 10 MG TABS tablet Take 1 tablet (10 mg total) by mouth daily before breakfast. 08/27/20   Yes Winfrey, Jenne Pane, MD  digoxin (LANOXIN) 0.125 MG tablet Take 1 tablet (0.125 mg total) by mouth daily. 01/10/21   Yes Milford, Maricela Bo, FNP  furosemide (LASIX) 20 MG tablet Take 4 tablets (80 mg total) by mouth daily for 3 days, THEN 2 tablets (40 mg total) daily. 01/10/21 06/19/21 Yes Milford, Maricela Bo, FNP  potassium chloride SA (KLOR-CON M) 20 MEQ tablet Take 2 tablets (40 mEq total) by mouth daily. 01/10/21   Yes Milford, Maricela Bo, FNP  sacubitril-valsartan (ENTRESTO) 97-103 MG Take 1 tablet by mouth 2 (two) times daily. 01/21/21   Yes Bensimhon, Shaune Pascal, MD  spironolactone (ALDACTONE) 25 MG tablet Take 1 tablet (25 mg total) by mouth daily. 01/10/21   Yes Milford, Maricela Bo, FNP  DESCOVY 200-25 MG tablet Take 1 tablet by mouth daily. Patient not taking: Reported on 05/21/2021 11/29/20     [provider]      Past Medical History:     Past Medical History:  Diagnosis Date   CHF (congestive heart failure) ([place])     Hypertension     Obesity     Snoring        Past Surgical History:      Past Surgical History:  Procedure Laterality Date   I & D EXTREMITY Right 03/04/2019    Procedure: IRRIGATION AND DEBRIDEMENT OF RIGHT ELBOW;  Surgeon: Roseanne Kaufman,  MD;  Location: [place];  Service: Orthopedics;  Laterality: Right;   IRRIGATION AND DEBRIDEMENT ABSCESS Left 03/04/2019    Procedure: Irrigation And Debridement Abscess Left hand;  Surgeon: Roseanne Kaufman, MD;  Location: [place];  Service: Orthopedics;  Laterality: Left;   UMBILICAL HERNIA REPAIR          Family History:       Family History  Problem Relation Age of Onset   Hypertension Mother     Hypertension Father        Social History: Social History  Socioeconomic History   Marital status: Single      Spouse name: Not on file   Number of children: Not on file   Years of education: Not on file   Highest education level: Not on file  Occupational History   Not on file  Tobacco Use   Smoking status: Every Day      Packs/day: 1.00      Years: 16.00      Pack years: 16.00      Types: Cigarettes   Smokeless tobacco: Never  Vaping Use   Vaping Use: Some days   Substances: Nicotine  Substance and Sexual Activity   Alcohol use: Not Currently      Comment: Pt stated "2 years clean"   Drug use: Not Currently      Comment: Pt stated "It was opiates"   Sexual activity: Not on file  Other Topics Concern   Not on file  Social History Narrative   Not on file    Social Determinants of Health       Financial Resource Strain: Low Risk    Difficulty of Paying Living Expenses: Not very hard  Food Insecurity: No Food Insecurity   Worried About Running Out of Food in the Last Year: Never true   Ran Out of Food in the Last Year: Never true  Transportation Needs: No Transportation Needs   Lack of Transportation (Medical): No   Lack of Transportation (Non-Medical): No  Physical Activity: Not on file  Stress: Not on file  Social Connections: Not on file      Allergies:  No Known Allergies   Objective:     Vital Signs:              Temp:  [97.8 F (36.6 C)] 97.8 F (36.6 C) (12/28 0442) Pulse Rate:  [54-110] 79 (12/28 1308) Resp:  [15-32] 23 (12/28 1308) BP:  (90-122)/(64-104) 104/85 (12/28 1308) SpO2:  [93 %-100 %] 99 % (12/28 1308) Weight:  [140.6 kg-160.6 kg] 160.6 kg (12/28 0651)     Filed Weights    05/21/21 0440 05/21/21 0651  Weight: (!) 140.6 kg (!) 160.6 kg        Physical Exam     General:  Appears dyspneic lying in bed HEENT: normal Neck: supple. JVP to jaw. Carotids 2+ bilat; no bruits. No lymphadenopathy or thyromegaly appreciated. Cor: PMI nondisplaced. Regular rhythm, tachycardia. No rubs, gallops or murmurs. Lungs: bibasilar rales Abdomen: soft, nontender, nondistended. No hepatosplenomegaly. No bruits or masses. Good bowel sounds. Extremities: no cyanosis, clubbing, rash, trace edema Neuro: alert & orientedx3, cranial nerves grossly intact. moves all 4 extremities w/o difficulty. Affect pleasant     EKG    Sinus tachycardia with rate 100 bpm, PVCs   Labs      Basic Metabolic Panel: Last Labs      Recent Labs  Lab 05/21/21 0457  NA 138  K 3.9  CL 105  CO2 23  GLUCOSE 98  BUN 20  CREATININE 1.17  CALCIUM 8.9        Liver Function Tests: Last Labs      Recent Labs  Lab 05/21/21 0457  AST 25  ALT 28  ALKPHOS 70  BILITOT 0.4  PROT 6.7  ALBUMIN 3.7      Last Labs   No results for input(s): LIPASE, AMYLASE in the last 168 hours.   Last Labs   No results for input(s): AMMONIA in the last 168 hours.  CBC: Last Labs      Recent Labs  Lab 05/21/21 0457  WBC 10.3  NEUTROABS 7.7  HGB 13.8  HCT 43.2  MCV 83.7  PLT 295        Cardiac Enzymes: Last Labs   No results for input(s): CKTOTAL, CKMB, CKMBINDEX, TROPONINI in the last 168 hours.     BNP: BNP (last 3 results) Recent Labs (within last 365 days)       Recent Labs    01/10/21 1014 05/04/21 0515 05/21/21 0457  BNP 278.0* 1,017.9* 1,141.2*        ProBNP (last 3 results) Recent Labs (within last 365 days)  No results for input(s): PROBNP in the last 8760 hours.       CBG: Last Labs      Recent Labs  Lab  05/21/21 0449  GLUCAP 113*        Coagulation Studies: Recent Labs (last 2 labs)   No results for input(s): LABPROT, INR in the last 72 hours.     Imaging: DG Chest Port 1 View   Result Date: 05/21/2021 CLINICAL DATA:  35 year old male with history of shortness of breath. EXAM: PORTABLE CHEST 1 VIEW COMPARISON:  Chest x-ray 05/04/2021. FINDINGS: Images under penetrated, limiting the diagnostic sensitivity and specificity of the examination. With these limitations in mind lung volumes are low. No consolidative airspace disease. No pleural effusions. No pneumothorax. No pulmonary nodule or mass noted. Pulmonary vasculature is engorged, without frank pulmonary edema. Mild cardiomegaly. IMPRESSION: 1. Low lung volumes with cardiomegaly and pulmonary venous congestion, but no frank pulmonary edema. Electronically Signed   By: Vinnie Langton M.D.   On: 05/21/2021 05:34   Imaging Results (Last 48 hours)        Patient Profile  35 y.o. male with history of chronic systolic HF, HTN, PVCs, hx IVDA, tobacco use, morbid obesity. Now admitted with a/c systolic CHF.   Assessment/Plan    1. Acute on chronic systolic HF - Diagnosed 0000000 Echo EF 20-25% RV moderately HK - Suspect HTN vs PVC-mediated (PVC burden 4.3% - probably not high enough to cause CM). - Echo (8/22): EF 20-25%, severe LV dysfunction, Grade II DD, RV moderately reduced - Has not had R/LHC or cMRI. No insurance coverage previously limited workup - A/C systolic CHF with symptoms starting after suspected viral illness about 3 weeks ago. Failed outpatient treatment with 2nd ED visit last few weeks. NYHA III. - Sub-optimal output with 40 mg lasix IV. Will increase to 80 mg IV BID.  - Hold entresto with soft BP - No beta blocker with a/c systolic CHF - Continue digoxin 0.125 mg daily - Continue spiro 25 mg daily - Needs to cut back fluid intake.  - Compliance previously an issue. Reports he has been taking medications daily -  Repeat echo   2. HTN, severe - Controlled. BP after soft in setting of acute on chronic CHF   3. Sinus tachycardia - Probably d/t #1 - Monitor for signs of low output   4. Frequent PVCs - Zio 12/21 4.3% PVCs - probably not high enough burden to cause CM.  - Needs sleep study - does not have insurance.   5. IVDA (heroin) - Reports no use since 12/20.   6. Tobacco Use - Smoking 1 pack every 4-5 days - Not ready to quit.   7. Morbid obesity - Needs weight loss. - Body mass index is 49.4 kg/m.    Consult HF TOC CSW. Reports he has  insurance that will be active in January 2023.    FINCH, LINDSAY N, PA-C 05/21/2021, 1:30 PM   Advanced Heart Failure Team Pager [phone] (M-F; 7a - 5p)  Please contact [facility] for night-coverage after hours (4p -7a ) and weekends on [email or web address]  Patient seen with PA, agree with the above note.   He has had orthopnea and worsening dyspnea for 3-4 wks.  Today was his 2nd visit to the ER.  He says that he has been taking all his meds.  Will get insurance on January 1st.   General: NAD, obese.  Neck: JVP 14+ cm, no thyromegaly or thyroid nodule.  Lungs: Clear to auscultation bilaterally with normal respiratory effort. CV: Nonpalpable PMI.  Heart regular S1/S2, no S3/S4, no murmur.  1+ ankle edema.  No carotid bruit.  Normal pedal pulses.  Abdomen: Soft, nontender, no hepatosplenomegaly, no distention.  Skin: Intact without lesions or rashes.  Neurologic: Alert and oriented x 3.  Psych: Normal affect. Extremities: No clubbing or cyanosis.  HEENT: Normal.   1. Acute on chronic systolic CHF: Low EF known since 9/21.  Last echo in 8/22 with EF 20-25%, severe LV dysfunction, Grade II DD, RV moderately reduced.  Cause uncertain. No heavy ETOH or drugs that commonly cause cardiomyopathy.  No strong family history.  He does smoke and has history of HTN, but BP now on the low side. Concern at one point for PVC-mediated CMP, but only 4.3% on last  monitor.  On exam, he is volume overloaded with NYHA class IIIb symptoms.  He will need admission.  - Lasix 80 mg IV bid.  - Continue digoxin (says he was taking at home though level undetectable).  - Continue dapagliflozin 10 daily.  - Continue spironolactone 25 daily.  - Hold Entresto with soft BP, hopefully restart soon.  - Cut back on fluid intake.  - Eventually will need RHC/LHC.  If no CAD, will need cardiac MRI.  2. Suspect OSA: Strong suspicion, stops breathing at night.  Will need sleep study when insurance kicks in.  3. Active smoker: Recommended cessation.   Loralie Champagne 05/21/2021 2:32 PM

## 2021-05-21 NOTE — ED Notes (Signed)
Got paient into a gown on the monitor patient is resting with call bell in reach

## 2021-05-21 NOTE — ED Provider Notes (Signed)
°  Physical Exam  BP 110/89 (BP Location: Right Arm)    Pulse (!) 54    Temp 97.8 F (36.6 C) (Oral)    Resp 20    Ht 1.803 m (5\' 11" )    Wt (!) 160.6 kg    SpO2 100%    BMI 49.40 kg/m   Physical Exam  ED Course/Procedures     Procedures  MDM  Patient with chf and being transferred ED to ED.  Dr. Discussed with Dr. Madilyn Hook.  Patient's weight up to 160 kg c.w. volume overload.  Lasix given. Appears stable at this time.  Will continue close monitoring until transfer.        Herbie Baltimore, MD 05/21/21 1537

## 2021-05-21 NOTE — ED Triage Notes (Signed)
Pt BIB [facility] from [facility] for c/o SOB that has persisted even when he takes 40 mg lasix for CHF. Pt is reported to increase his dosages of lasix with no improvement. Urine out has decreased & SOB continues to be an issue. A/Ox4 upon arrival to ED, BP 114/100 with 104 bpm & intermittent periods of bradycardia in the 40's. 20g PIV in Rt FA, BNP 1142, Trop 45 (per [facility]).

## 2021-05-21 NOTE — Plan of Care (Signed)
  Problem: Cardiac: Goal: Ability to achieve and maintain adequate cardiopulmonary perfusion will improve Outcome: Progressing   

## 2021-05-21 NOTE — ED Triage Notes (Signed)
Pt states he was seen here a couple of weeks ago for same complaint and was supposed to follow up with cardiology but they are unable to see him until middle of February; pt states the sob wakes him up at [employer]

## 2021-05-21 NOTE — Plan of Care (Signed)

## 2021-05-21 NOTE — Consult Note (Deleted)
Advanced Heart Failure Team History and Physical Note   PCP:  Pcp, No  PCP-Cardiology: Kirk Ruths, MD     Reason for Admission: Acute on chronic systolic CHF   HPI:    Donald Murray is a  35 y.o. male with HTN, IVDA (heroin), tobacco use, morbid obesity and systolic HF (onset 0000000) referred by Dr. [name] for further management of his HF.    Echo 10/20 EF 60-65%    Admitted to [facility] 9/21 with new onset HF in setting of severe HTN 174/128. ECG with sinus tach and frequent PVCs.    Echo 02/16/20: EF 20-25% Moderate RV dysfunction.    Seen by AHF in HF Consultation for the first time 04/24/20. 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 Lasix and Entresto increased. Digoxin added. Zio placed to quantify PVC burden - 4.3%. Zio 12/21 Sinus - average HR 104, 4.3% PVCs.   Clinic visit 12/21 doing well, Entresto was increased.  He no showed his follow up appointment in late February 2022.     Seen in hospital 08/15/20 with medication and dietary non-adherence leading to fluid retention and he was having trouble sleeping, getting short of breath at night and having PND.  He was restarted on GDMT and given IV lasix 60mg  x3 doses responded well and was discharged the following day.     Hospital follow up 4/22 GDMT titrated further with plans to repeat echo, if EF not improved, consider cath.   Admitted 8/22 with A/C CHF, diuresed with IV lasix. Repeat echo with EF 20-25%, RV moderately reduced. Exacerbation attributed to medication non-compliance and increase fluid intake. Discharge weight 327 lbs.   Last seen in clinic for follow-up in 08/22. Had not been adherent with medications. Appeared volume up. Referred for paramedicine but discharged after unable to contact him. No showed his follow-up in HF clinic 09/22.    Seen in ED at [facility] on 05/04/21 with shortness of breath and cough. Felt to be volume overloaded after suspected viral illness. Given IV lasix and home furosemide increased  from 40 mg to 80 mg daily X 7 days.    Presented to ED at [facility] again early this am with worsening dyspnea X 3 weeks. Had remained on increased dose of his furosemide at 40 mg BID at home. Continued with cough and severe orthopnea despite this. Digoxin level < 0.2, BNP 1,141, HS troponin 35>30 (chronically elevated), Scr 1.17, CO2 23. Negative for COVID and influenza. Chest x-ray with low lung volumes with cardiomegaly and pulmonary venous congestion. Appeared volume overloaded and was given IV lasix 40 mg.  BP soft. Cardiology contacted. ED to ED transfer recommended for evaluation by Advanced Heart Failure.    SBP 90s-low 100s. Rhythm sinus tach low 100s with PVCs.   Reports one unmeasured void prior to transfer here. Has urinated several times since arrival here, once 450 cc followed by very small amounts. Unable to lie flat for physical exam.   Reports adherence with medications. Following sodium restriction but admits to trouble restricting fluid intake.    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  Denies recent alcohol or drug use. Smokes 1 pack of cigarettes every 4-5 days.     Review of Systems: [y] = yes, [ ]  = no  General: Weight gain [Y]; Weight loss [ ] ; Anorexia [ ] ; Fatigue [ ] ; Fever [ ] ; Chills [ ] ; Weakness [ ]   Cardiac: Chest pain/pressure [ ] ; Resting SOB [Y];  Exertional SOB [Y]; Orthopnea [Y]; Pedal Edema [ ] ; Palpitations [ ] ; Syncope [ ] ; Presyncope [ ] ; Paroxysmal nocturnal dyspnea[Y]  Pulmonary: Cough [ ] ; Wheezing[ ] ; Hemoptysis[ ] ; Sputum [ ] ; Snoring [ ]   GI: Vomiting[ ] ; Dysphagia[ ] ; Melena[ ] ; Hematochezia [ ] ; Heartburn[ ] ; Abdominal pain [ ] ; Constipation [ ] ; Diarrhea [ ] ; BRBPR [ ]   GU: Hematuria[ ] ; Dysuria [ ] ; Nocturia[ ]   Vascular: Pain in legs with walking [ ] ; Pain in feet with lying flat [ ] ; Non-healing sores [ ] ; Stroke [ ] ; TIA [ ] ; Slurred speech [ ] ;  Neuro: Headaches[ ] ; Vertigo[ ] ; Seizures[ ] ; Paresthesias[ ] ;Blurred  vision [ ] ; Diplopia [ ] ; Vision changes [ ]   Ortho/Skin: Arthritis [ ] ; Joint pain [ ] ; Muscle pain [ ] ; Joint swelling [ ] ; Back Pain [ ] ; Rash [ ]   Psych: Depression[ ] ; Anxiety[ ]   Heme: Bleeding problems [ ] ; Clotting disorders [ ] ; Anemia [ ]   Endocrine: Diabetes [ ] ; Thyroid dysfunction[ ]   Home Medications Prior to Admission medications   Medication Sig Start Date End Date Taking? Authorizing Provider  carvedilol (COREG) 12.5 MG tablet Take 1 tablet (12.5 mg total) by mouth 2 (two) times daily with a meal. 01/10/21  Yes Milford, Maricela Bo, FNP  dapagliflozin propanediol (FARXIGA) 10 MG TABS tablet Take 1 tablet (10 mg total) by mouth daily before breakfast. 08/27/20  Yes Winfrey, Jenne Pane, MD  digoxin (LANOXIN) 0.125 MG tablet Take 1 tablet (0.125 mg total) by mouth daily. 01/10/21  Yes Milford, Maricela Bo, FNP  furosemide (LASIX) 20 MG tablet Take 4 tablets (80 mg total) by mouth daily for 3 days, THEN 2 tablets (40 mg total) daily. 01/10/21 06/19/21 Yes Milford, Maricela Bo, FNP  potassium chloride SA (KLOR-CON M) 20 MEQ tablet Take 2 tablets (40 mEq total) by mouth daily. 01/10/21  Yes Milford, Maricela Bo, FNP  sacubitril-valsartan (ENTRESTO) 97-103 MG Take 1 tablet by mouth 2 (two) times daily. 01/21/21  Yes Bensimhon, Shaune Pascal, MD  spironolactone (ALDACTONE) 25 MG tablet Take 1 tablet (25 mg total) by mouth daily. 01/10/21  Yes Milford, Maricela Bo, FNP  DESCOVY 200-25 MG tablet Take 1 tablet by mouth daily. Patient not taking: Reported on 05/21/2021 11/29/20   [provider]    Past Medical History: Past Medical History:  Diagnosis Date   CHF (congestive heart failure) ([place])    Hypertension    Obesity    Snoring     Past Surgical History: Past Surgical History:  Procedure Laterality Date   I & D EXTREMITY Right 03/04/2019   Procedure: IRRIGATION AND DEBRIDEMENT OF RIGHT ELBOW;  Surgeon: Roseanne Kaufman, MD;  Location: [facility];  Service: Orthopedics;  Laterality: Right;    IRRIGATION AND DEBRIDEMENT ABSCESS Left 03/04/2019   Procedure: Irrigation And Debridement Abscess Left hand;  Surgeon: Roseanne Kaufman, MD;  Location: [facility] [place];  Service: Orthopedics;  Laterality: Left;   UMBILICAL HERNIA REPAIR      Family History:  Family History  Problem Relation Age of Onset   Hypertension Mother    Hypertension Father     Social History: Social History   Socioeconomic History   Marital status: Single    Spouse name: Not on file   Number of children: Not on file   Years of education: Not on file   Highest education level: Not on file  Occupational History   Not on file  Tobacco Use   Smoking status: Every Day  Packs/day: 1.00    Years: 16.00    Pack years: 16.00    Types: Cigarettes   Smokeless tobacco: Never  Vaping Use   Vaping Use: Some days   Substances: Nicotine  Substance and Sexual Activity   Alcohol use: Not Currently    Comment: Pt stated "2 years clean"   Drug use: Not Currently    Comment: Pt stated "It was opiates"   Sexual activity: Not on file  Other Topics Concern   Not on file  Social History Narrative   Not on file   Social Determinants of Health   Financial Resource Strain: Low Risk    Difficulty of Paying Living Expenses: Not very hard  Food Insecurity: No Food Insecurity   Worried About Running Out of Food in the Last Year: Never true   Ran Out of Food in the Last Year: Never true  Transportation Needs: No Transportation Needs   Lack of Transportation (Medical): No   Lack of Transportation (Non-Medical): No  Physical Activity: Not on file  Stress: Not on file  Social Connections: Not on file    Allergies:  No Known Allergies  Objective:    Vital Signs:   Temp:  [97.8 F (36.6 C)] 97.8 F (36.6 C) (12/28 0442) Pulse Rate:  [54-110] 79 (12/28 1308) Resp:  [15-32] 23 (12/28 1308) BP: (90-122)/(64-104) 104/85 (12/28 1308) SpO2:  [93 %-100 %] 99 % (12/28 1308) Weight:  [140.6 kg-160.6 kg] 160.6 kg (12/28  0651)   Filed Weights   05/21/21 0440 05/21/21 0651  Weight: (!) 140.6 kg (!) 160.6 kg     Physical Exam     General:  Appears dyspneic lying in bed HEENT: normal Neck: supple. JVP to jaw. Carotids 2+ bilat; no bruits. No lymphadenopathy or thyromegaly appreciated. Cor: PMI nondisplaced. Regular rhythm, tachycardia. No rubs, gallops or murmurs. Lungs: bibasilar rales Abdomen: soft, nontender, nondistended. No hepatosplenomegaly. No bruits or masses. Good bowel sounds. Extremities: no cyanosis, clubbing, rash, trace edema Neuro: alert & orientedx3, cranial nerves grossly intact. moves all 4 extremities w/o difficulty. Affect pleasant   EKG   Sinus tachycardia with rate 100 bpm, PVCs  Labs     Basic Metabolic Panel: Recent Labs  Lab 05/21/21 0457  NA 138  K 3.9  CL 105  CO2 23  GLUCOSE 98  BUN 20  CREATININE 1.17  CALCIUM 8.9    Liver Function Tests: Recent Labs  Lab 05/21/21 0457  AST 25  ALT 28  ALKPHOS 70  BILITOT 0.4  PROT 6.7  ALBUMIN 3.7   No results for input(s): LIPASE, AMYLASE in the last 168 hours. No results for input(s): AMMONIA in the last 168 hours.  CBC: Recent Labs  Lab 05/21/21 0457  WBC 10.3  NEUTROABS 7.7  HGB 13.8  HCT 43.2  MCV 83.7  PLT 295    Cardiac Enzymes: No results for input(s): CKTOTAL, CKMB, CKMBINDEX, TROPONINI in the last 168 hours.  BNP: BNP (last 3 results) Recent Labs    01/10/21 1014 05/04/21 0515 05/21/21 0457  BNP 278.0* 1,017.9* 1,141.2*    ProBNP (last 3 results) No results for input(s): PROBNP in the last 8760 hours.   CBG: Recent Labs  Lab 05/21/21 0449  GLUCAP 113*    Coagulation Studies: No results for input(s): LABPROT, INR in the last 72 hours.  Imaging: DG Chest Port 1 View  Result Date: 05/21/2021 CLINICAL DATA:  35 year old male with history of shortness of breath. EXAM: PORTABLE CHEST 1 VIEW COMPARISON:  Chest x-ray 05/04/2021. FINDINGS: Images under penetrated, limiting  the diagnostic sensitivity and specificity of the examination. With these limitations in mind lung volumes are low. No consolidative airspace disease. No pleural effusions. No pneumothorax. No pulmonary nodule or mass noted. Pulmonary vasculature is engorged, without frank pulmonary edema. Mild cardiomegaly. IMPRESSION: 1. Low lung volumes with cardiomegaly and pulmonary venous congestion, but no frank pulmonary edema. Electronically Signed   By: Vinnie Langton M.D.   On: 05/21/2021 05:34     Patient Profile  35 y.o. male with history of chronic systolic HF, HTN, PVCs, hx IVDA, tobacco use, morbid obesity. Now admitted with a/c systolic CHF.  Assessment/Plan   1. Acute on chronic systolic HF - Diagnosed 0000000 Echo EF 20-25% RV moderately HK - Suspect HTN vs PVC-mediated (PVC burden 4.3% - probably not high enough to cause CM). - Echo (8/22): EF 20-25%, severe LV dysfunction, Grade II DD, RV moderately reduced - Has not had R/LHC or cMRI. No insurance coverage previously limited workup - A/C systolic CHF with symptoms starting after suspected viral illness about 3 weeks ago. Failed outpatient treatment with 2nd ED visit last few weeks. NYHA III. - Sub-optimal output with 40 mg lasix IV. Will increase to 80 mg IV BID.  - Hold entresto with soft BP - No beta blocker with a/c systolic CHF - Continue digoxin 0.125 mg daily - Continue spiro 25 mg daily - Needs to cut back fluid intake.  - Compliance previously an issue. Reports he has been taking medications daily   2. HTN, severe - Controlled. BP after soft in setting of acute on chronic CHF   3. Sinus tachycardia - Probably d/t #1 - Monitor for signs of low output   4. Frequent PVCs - Zio 12/21 4.3% PVCs - probably not high enough burden to cause CM.  - Needs sleep study - does not have insurance.   5. IVDA (heroin) - Reports no use since 12/20.   6. Tobacco Use - Smoking 1 pack every 4-5 days - Not ready to quit.   7. Morbid  obesity - Needs weight loss. - Body mass index is 49.4 kg/m.     Consult HF TOC CSW. Reports he has insurance that will be active in January 2023.      Jamarcus Laduke N, PA-C 05/21/2021, 1:30 PM  Advanced Heart Failure Team Pager [phone] (M-F; 7a - 5p)  Please contact [facility] for night-coverage after hours (4p -7a ) and weekends on [email or web address]

## 2021-05-21 NOTE — ED Provider Notes (Signed)
Pt arrives from [facility] as transfer for stat cardiology consult.  Consult placed on arrival at 8:30 AM.  Cards to see.  Pt stable on RA, 98%.  RR 24.  Obese on exam, lung sounds difficult to auscultate, but speaking in full sentences without resp distress.  HR 110 bpm and regular.  Labs with BNP elevated, trop 35->35  Pt received IV lasix with moderate urine output en route to hospital  Per medical records, pt has known CHF, had echo 4 months ago in Aug 2022:   1. Left ventricular ejection fraction, by estimation, is 20 to 25%. The  left ventricle has severely decreased function. The left ventricle  demonstrates global hypokinesis. The left ventricular internal cavity size  was severely dilated. There is mild left ventricular hypertrophy. Left ventricular diastolic parameters are consistent with Grade II diastolic dysfunction (pseudonormalization).       140 pm - notified by cardiology they will admit the patient for CHF exacerbation.   Terald Sleeper, MD 05/21/21 1341

## 2021-05-21 NOTE — Progress Notes (Signed)
NURSING PROGRESS NOTE  Donald Murray [id number] Admission Data: 05/21/2021 8:08 PM Attending Provider: Laurey Morale, MD PCP:Pcp, No Code Status: Full  Donald Murray is a 35 y.o. male patient admitted from ED:  -No acute distress noted.  -No complaints of shortness of breath.  -No complaints of chest pain.   Cardiac Monitoring:  Cardiac monitor yields:sinus tachycardia.  Blood pressure 91/75, pulse 86, temperature 97.9 F (36.6 C), temperature source Oral, resp. rate 20, height 5\' 11"  (1.803 m), weight (!) 158.2 kg, SpO2 96 %.   IV Fluids:  IV in place, occlusive dsg intact without redness, IV cath forearm left, condition patent and no redness none.   Allergies:  Patient has no known allergies.  Past Medical History:   has a past medical history of CHF (congestive heart failure) (HCC), Hypertension, Obesity, and Snoring.  Past Surgical History:   has a past surgical history that includes Umbilical hernia repair; I & D extremity (Right, 03/04/2019); and Irrigation and debridement abscess (Left, 03/04/2019).  Social History:   reports that he has been smoking cigarettes. He has a 16.00 pack-year smoking history. He has never used smokeless tobacco. He reports that he does not currently use alcohol. He reports that he does not currently use drugs.  Skin: Clean, dry, and intact.  Patient/Family orientated to room. Information packet given to patient/family. Admission inpatient armband information verified with patient/family to include name and date of birth and placed on patient arm. Side rails up x 2, fall assessment and education completed with patient/family. Patient/family able to verbalize understanding of risk associated with falls and verbalized understanding to call for assistance before getting out of bed. Call light within reach. Patient/family able to voice and demonstrate understanding of unit orientation instructions.    Will continue to evaluate and treat per MD  orders.

## 2021-05-21 NOTE — ED Notes (Signed)
Doug with CL called for consult 7:15

## 2021-05-21 NOTE — ED Provider Notes (Signed)
[facility] EMERGENCY DEPT Provider Note   CSN: [id number] Arrival date & time: 05/21/21  0435     History Chief Complaint  Patient presents with   Shortness of Breath    Donald Murray is a 35 y.o. male.  The history is provided by the patient and medical records.  Shortness of Breath Donald Murray is a 35 y.o. male who presents to the Emergency Department complaining of shortness of breath.  He presents to the emergency department complaining of 3 weeks of progressive shortness of breath.  He has significant dyspnea on exertion, fatigue and severe orthopnea.  He was seen in the emergency department several weeks ago for similar symptoms with plan to follow-up with his cardiologist.  When calling cardiology for follow-up he cannot get an appointment until mid February.  Since ED discharge he has been increasing his Lasix from 40mg  to 40 mg twice daily.  He uses this occasionally to assist with his breathing when he sleeps.  Yesterday he took 80 mg twice daily and still had severe orthopnea and was unable to sleep due to difficulty breathing.  He does have associated nonproductive cough.  He also reports some abdominal pain on the left side related to umbilical hernia that is present when he coughs.  That has been present for the last week.  He also reports decreased urinary output over the last few days.  He states that he is only urinated 3 times today.  No fevers, chest pain, nausea, vomiting.  He is compliant with his home medications.  He does have a history of CHF, diagnosed 1 year ago.    Past Medical History:  Diagnosis Date   CHF (congestive heart failure) ([place])    Hypertension    Obesity    Snoring     Patient Active Problem List   Diagnosis Date Noted   Acute on chronic heart failure ([place]) 12/29/2020   PVC (premature ventricular contraction) 08/15/2020   Essential hypertension 03/20/2020   Acute congestive heart failure ([place])    Cellulitis 03/04/2019    IVDU (intravenous drug user) 03/04/2019    Past Surgical History:  Procedure Laterality Date   I & D EXTREMITY Right 03/04/2019   Procedure: IRRIGATION AND DEBRIDEMENT OF RIGHT ELBOW;  Surgeon: Roseanne Kaufman, MD;  Location: [facility];  Service: Orthopedics;  Laterality: Right;   IRRIGATION AND DEBRIDEMENT ABSCESS Left 03/04/2019   Procedure: Irrigation And Debridement Abscess Left hand;  Surgeon: Roseanne Kaufman, MD;  Location: [facility];  Service: Orthopedics;  Laterality: Left;   UMBILICAL HERNIA REPAIR         Family History  Problem Relation Age of Onset   Hypertension Mother    Hypertension Father     Social History   Tobacco Use   Smoking status: Every Day    Packs/day: 1.00    Years: 16.00    Pack years: 16.00    Types: Cigarettes   Smokeless tobacco: Never  Vaping Use   Vaping Use: Some days   Substances: Nicotine  Substance Use Topics   Alcohol use: Not Currently    Comment: Pt stated "2 years clean"   Drug use: Not Currently    Comment: Pt stated "It was opiates"    Home Medications Prior to Admission medications   Medication Sig Start Date End Date Taking? Authorizing Provider  carvedilol (COREG) 12.5 MG tablet Take 1 tablet (12.5 mg total) by mouth 2 (two) times daily with a meal. 01/10/21   Milford, Maricela Bo, FNP  dapagliflozin propanediol (FARXIGA) 10 MG TABS tablet Take 1 tablet (10 mg total) by mouth daily before breakfast. 08/27/20   Katherine Roan, MD  DESCOVY 200-25 MG tablet Take 1 tablet by mouth daily. 11/29/20   [provider]  digoxin (LANOXIN) 0.125 MG tablet Take 1 tablet (0.125 mg total) by mouth daily. 01/10/21   Milford, Maricela Bo, FNP  furosemide (LASIX) 20 MG tablet Take 4 tablets (80 mg total) by mouth daily for 3 days, THEN 2 tablets (40 mg total) daily. 01/10/21 06/19/21  Rafael Bihari, FNP  potassium chloride SA (KLOR-CON M) 20 MEQ tablet Take 2 tablets (40 mEq total) by mouth daily. 01/10/21   Milford, Maricela Bo, FNP   sacubitril-valsartan (ENTRESTO) 97-103 MG Take 1 tablet by mouth 2 (two) times daily. 01/21/21   Bensimhon, Shaune Pascal, MD  spironolactone (ALDACTONE) 25 MG tablet Take 1 tablet (25 mg total) by mouth daily. 01/10/21   Rafael Bihari, FNP    Allergies    Patient has no known allergies.  Review of Systems   Review of Systems  Respiratory:  Positive for shortness of breath.   All other systems reviewed and are negative.  Physical Exam Updated Vital Signs BP 110/89 (BP Location: Right Arm)    Pulse (!) 54    Temp 97.8 F (36.6 C) (Oral)    Resp 20    Ht 5\' 11"  (1.803 m)    Wt (!) 160.6 kg    SpO2 100%    BMI 49.40 kg/m   Physical Exam Vitals and nursing note reviewed.  Constitutional:      General: He is in acute distress.     Appearance: He is well-developed. He is diaphoretic.  HENT:     Head: Normocephalic and atraumatic.  Cardiovascular:     Rate and Rhythm: Regular rhythm. Tachycardia present.     Heart sounds: No murmur heard. Pulmonary:     Effort: Pulmonary effort is normal. No respiratory distress.     Comments: Distant breath sounds bilaterally.  There are faint crackles in the right lung base Abdominal:     Palpations: Abdomen is soft.     Tenderness: There is no abdominal tenderness. There is no guarding or rebound.  Musculoskeletal:        General: No tenderness.     Comments: Faint DP pulses bilaterally.  There is delayed cap refill to bilateral lower extremities.  No significant lower extremity edema  Skin:    General: Skin is warm.  Neurological:     Mental Status: He is alert and oriented to person, place, and time.  Psychiatric:        Behavior: Behavior normal.    ED Results / Procedures / Treatments   Labs (all labs ordered are listed, but only abnormal results are displayed) Labs Reviewed  CBC WITH DIFFERENTIAL/PLATELET - Abnormal; Notable for the following components:      Result Value   RDW 18.2 (*)    All other components within normal limits   BRAIN NATRIURETIC PEPTIDE - Abnormal; Notable for the following components:   B Natriuretic Peptide 1,141.2 (*)    All other components within normal limits  DIGOXIN LEVEL - Abnormal; Notable for the following components:   Digoxin Level <0.2 (*)    All other components within normal limits  CBG MONITORING, ED - Abnormal; Notable for the following components:   Glucose-Capillary 113 (*)    All other components within normal limits  TROPONIN I (HIGH SENSITIVITY) -  Abnormal; Notable for the following components:   Troponin I (High Sensitivity) 35 (*)    All other components within normal limits  RESP PANEL BY RT-PCR (FLU A&B, COVID) ARPGX2  COMPREHENSIVE METABOLIC PANEL  TROPONIN I (HIGH SENSITIVITY)    EKG None  Radiology DG Chest Port 1 View  Result Date: 05/21/2021 CLINICAL DATA:  35 year old male with history of shortness of breath. EXAM: PORTABLE CHEST 1 VIEW COMPARISON:  Chest x-ray 05/04/2021. FINDINGS: Images under penetrated, limiting the diagnostic sensitivity and specificity of the examination. With these limitations in mind lung volumes are low. No consolidative airspace disease. No pleural effusions. No pneumothorax. No pulmonary nodule or mass noted. Pulmonary vasculature is engorged, without frank pulmonary edema. Mild cardiomegaly. IMPRESSION: 1. Low lung volumes with cardiomegaly and pulmonary venous congestion, but no frank pulmonary edema. Electronically Signed   By: Trudie Reed M.D.   On: 05/21/2021 05:34    Procedures Procedures   Medications Ordered in ED Medications  furosemide (LASIX) injection 40 mg (40 mg Intravenous Given 05/21/21 4270)    ED Course  I have reviewed the triage vital signs and the nursing notes.  Pertinent labs & imaging results that were available during my care of the patient were reviewed by me and considered in my medical decision making (see chart for details).    MDM Rules/Calculators/A&P                            Patient with history of CHF here for evaluation of increased shortness of breath, cough, orthopnea.  Patient ill-appearing on ED presentation with diaphoresis, poor peripheral perfusion.  He does have occasional crackles on lung exam.  He has tachypnea but no respiratory distress.  No evidence of pneumonia on x-ray.  His BNP is elevated.  Renal function is within normal limits.  Presentation is not consistent with PE, pneumonia.  Concern for CHF.  Discussed with Dr. Herbie Baltimore with cardiology.  Due to borderline blood pressures, unclear where patient will need placed in the hospital recommends ED to ED transfer so he can be evaluated at the bedside by cardiology.  Will treat with Lasix for diuresis.  Discussed with Dr. Senaida Ores in the [facility] emergency department, who accepts the patient in transfer.  Discussed with patient findings of studies and recommendation for admission and he is in agreement with treatment plan.  Final Clinical Impression(s) / ED Diagnoses Final diagnoses:  None    Rx / DC Orders ED Discharge Orders     None        Tilden Fossa, MD 05/21/21 [phone]

## 2021-05-21 NOTE — ED Notes (Signed)
Pt given ice chips with EDP approval.  

## 2021-05-22 ENCOUNTER — Inpatient Hospital Stay (HOSPITAL_COMMUNITY): Payer: Self-pay

## 2021-05-22 DIAGNOSIS — I5023 Acute on chronic systolic (congestive) heart failure: Secondary | ICD-10-CM

## 2021-05-22 LAB — ECHOCARDIOGRAM COMPLETE
Area-P 1/2: 6.63 cm2
Calc EF: 12 %
Height: 71 in
S' Lateral: 6.4 cm
Single Plane A2C EF: 17.2 %
Single Plane A4C EF: 10.1 %
Weight: 5509.74 oz

## 2021-05-22 LAB — CBC
HCT: 43.2 % (ref 39.0–52.0)
Hemoglobin: 13.7 g/dL (ref 13.0–17.0)
MCH: 26.4 pg (ref 26.0–34.0)
MCHC: 31.7 g/dL (ref 30.0–36.0)
MCV: 83.2 fL (ref 80.0–100.0)
Platelets: 293 10*3/uL (ref 150–400)
RBC: 5.19 MIL/uL (ref 4.22–5.81)
RDW: 18.1 % — ABNORMAL HIGH (ref 11.5–15.5)
WBC: 10.8 10*3/uL — ABNORMAL HIGH (ref 4.0–10.5)
nRBC: 0 % (ref 0.0–0.2)

## 2021-05-22 LAB — BASIC METABOLIC PANEL
Anion gap: 11 (ref 5–15)
BUN: 24 mg/dL — ABNORMAL HIGH (ref 6–20)
CO2: 22 mmol/L (ref 22–32)
Calcium: 8.5 mg/dL — ABNORMAL LOW (ref 8.9–10.3)
Chloride: 104 mmol/L (ref 98–111)
Creatinine, Ser: 1.33 mg/dL — ABNORMAL HIGH (ref 0.61–1.24)
GFR, Estimated: >60 mL/min (ref >60)
Glucose, Bld: 96 mg/dL (ref 70–99)
Potassium: 4.2 mmol/L (ref 3.5–5.1)
Sodium: 137 mmol/L (ref 135–145)

## 2021-05-22 LAB — MAGNESIUM: Magnesium: 2 mg/dL (ref 1.7–2.4)

## 2021-05-22 MED ORDER — POTASSIUM CHLORIDE CRYS ER 20 MEQ PO TBCR
30.0000 meq | EXTENDED_RELEASE_TABLET | Freq: Two times a day (BID) | ORAL | Status: AC
  Administered 2021-05-22 (×2): 30 meq via ORAL
  Filled 2021-05-22 (×2): qty 1

## 2021-05-22 MED ORDER — PERFLUTREN LIPID MICROSPHERE
1.0000 mL | INTRAVENOUS | Status: AC | PRN
Start: 2021-05-22 — End: 2021-05-22
  Administered 2021-05-22: 11:00:00 2 mL via INTRAVENOUS
  Filled 2021-05-22: qty 10

## 2021-05-22 MED ORDER — SACUBITRIL-VALSARTAN 24-26 MG PO TABS
1.0000 | ORAL_TABLET | Freq: Two times a day (BID) | ORAL | Status: DC
  Administered 2021-05-22: 21:00:00 1 via ORAL
  Filled 2021-05-22: qty 1

## 2021-05-22 MED ORDER — MAGNESIUM SULFATE 2 GM/50ML IV SOLN
2.0000 g | Freq: Once | INTRAVENOUS | Status: AC
  Administered 2021-05-22: 12:00:00 2 g via INTRAVENOUS
  Filled 2021-05-22: qty 50

## 2021-05-22 NOTE — Plan of Care (Signed)

## 2021-05-22 NOTE — Discharge Summary (Addendum)
Advanced Heart Failure Team  Discharge Summary   Patient ID: Antoinette Haskett MRN: [id number], DOB/AGE: January 28, 1986 35 y.o. Admit date: 05/21/2021 D/C date:     05/23/2021   Primary Discharge Diagnoses:  Acute on Chronic Systolic Heart Failure Tobacco Abuse Suspected OSA PVCs   Hospital Course:   35 y.o. male with HTN, remote h/o IVDA (heroin), tobacco use, morbid obesity and systolic HF (onset 9/21).  Echo 10/20 EF 60-65%    Admitted to [facility] 9/21 with new onset HF in setting of severe HTN 174/128. ECG with sinus tach and frequent PVCs.    Echo 02/16/20: EF 20-25% Moderate RV dysfunction.    Seen by AHF in HF Consultation for the first time 04/24/20. 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 Lasix and Entresto increased. Digoxin added. Zio placed to quantify PVC burden - 4.3%. Zio 12/21 Sinus - average HR 104, 4.3% PVCs.   Admitted 8/22 with A/C CHF, diuresed with IV lasix. Repeat echo with EF 20-25%, RV moderately reduced. Exacerbation attributed to medication non-compliance and increase fluid intake. Discharge weight 327 lbs.  Seen in ED at [facility] on 05/04/21 with shortness of breath and cough. Felt to be volume overloaded after suspected viral illness. Given IV lasix and home furosemide increased from 40 mg to 80 mg daily X 7 days.    Presented to ED at [facility] 12/28 with worsening dyspnea X 3 weeks. Had remained on increased dose of his furosemide at 40 mg BID at home. Continued with cough and severe orthopnea despite this. Digoxin level < 0.2, BNP 1,141, HS troponin 35>30 (chronically elevated), Scr 1.17, CO2 23. Negative for COVID and influenza. Chest x-ray with low lung volumes with cardiomegaly and pulmonary venous congestion. Appeared volume overloaded and was given IV lasix 40 mg.  BP soft. Cardiology contacted. ED to ED transfer recommended for evaluation by Advanced Heart Failure.   He was admitted and started on IV Lasix w/ good diuretic response. Diuresed total of 16 lb.  Discharged on 60 mg lasix daily (prior dose 40 mg daily). Entresto and Coreg initially held on admit given soft BP but continued on farxiga, spiro and digoxin. Entresto added back prior to discharge but at lower dose (discharge 24/26 mg BID, on 97/103 mg BID PTA). Coreg also restarted at 3.125 mg BID (on 12.5 mg BID PTA).   Echo showed severely reduced LVEF <20% w/ global HK, no visible thrombus, + mild MR, RV normal. R/LHC was recommended after diuresis to r/o CAD however pt requested for cath to be completed as outpatient as he had to be discharged by 12/30 to be with company visiting in town.  Post hospital f/u arranged. Attempt to titrate entresto at f/u if blood pressure remains stable. Will need set up for outpatient R/LHC. Will also need sleep study to assess for OSA.     Discharge Weight Range: 354>>338 Discharge Vitals: Blood pressure (!) 91/59, pulse 97, temperature 97.7 F (36.5 C), temperature source Oral, resp. rate (!) 25, height  (1.803 m), weight (!) 153.3 kg, SpO2 100 %.  Labs: Lab Results  Component Value Date   WBC 10.8 (H) 05/22/2021   HGB 13.7 05/22/2021   HCT 43.2 05/22/2021   MCV 83.2 05/22/2021   PLT 293 05/22/2021    Recent Labs  Lab 05/21/21 0457 05/21/21 1339 05/23/21 0322  NA 138   < > 137  K 3.9   < > 3.9  CL 105   < > 102  CO2 23   < > 26  BUN 20   < > 23*  CREATININE 1.17   < > 1.29*  CALCIUM 8.9   < > 8.4*  PROT 6.7  --   --   BILITOT 0.4  --   --   ALKPHOS 70  --   --   ALT 28  --   --   AST 25  --   --   GLUCOSE 98   < > 91   < > = values in this interval not displayed.   No results found for: CHOL, HDL, LDLCALC, TRIG BNP (last 3 results) Recent Labs    01/10/21 1014 05/04/21 0515 05/21/21 0457  BNP 278.0* 1,017.9* 1,141.2*    ProBNP (last 3 results) No results for input(s): PROBNP in the last 8760 hours.   Diagnostic Studies/Procedures   ECHOCARDIOGRAM COMPLETE  Result Date: 05/22/2021    ECHOCARDIOGRAM REPORT    Patient Name:   DAMARCO KEYSOR Date of Exam: 05/22/2021 Medical Rec #:  [id number]     Height:       71.0 in Accession #:    [id number]    Weight:       344.4 lb Date of Birth:  1985/12/08    BSA:          2.656 m Patient Age:    34 years      BP:           109/42 mmHg Patient Gender: M             HR:           98 bpm. Exam Location:  Inpatient Procedure: 2D Echo, Cardiac Doppler, Color Doppler and Intracardiac            Opacification Agent Indications:    I50.40* Unspecified combined systolic (congestive) and diastolic                 (congestive) heart failure  History:        Patient has prior history of Echocardiogram examinations, most                 recent 01/09/2021. CHF, Abnormal ECG, Arrythmias:Tachycardia and                 PVC, Signs/Symptoms:Shortness of Breath and Dyspnea; Risk                 Factors:Current Smoker and Hypertension. IVDU. Heroin use.  Sonographer:    Sheralyn Boatman RDCS Referring Phys: [id number] [facility]  Sonographer Comments: Technically difficult study due to poor echo windows and patient is morbidly obese. Image acquisition challenging due to patient body habitus. IMPRESSIONS  1. Severe global hypokinesis. Left ventricular ejection fraction, by estimation, is <20%. The left ventricle has severely decreased function. The left ventricle demonstrates global hypokinesis. The left ventricular internal cavity size was moderately dilated. Indeterminate diastolic filling due to E-A fusion. Elevated left ventricular end-diastolic pressure.  2. Right ventricular systolic function is normal. The right ventricular size is normal. There is moderately elevated pulmonary artery systolic pressure.  3. Left atrial size was mildly dilated.  4. The mitral valve is normal in structure. Mild mitral valve regurgitation. No evidence of mitral stenosis.  5. The aortic valve is normal in structure. Aortic valve regurgitation is not visualized. No aortic stenosis is present.  6. The inferior vena cava is  dilated in size with <50% respiratory variability, suggesting right atrial pressure of 15 mmHg. FINDINGS  Left Ventricle: Severe global hypokinesis. Left ventricular  ejection fraction, by estimation, is <20%. The left ventricle has severely decreased function. The left ventricle demonstrates global hypokinesis. Definity contrast agent was given IV to delineate the left ventricular endocardial borders. The left ventricular internal cavity size was moderately dilated. There is no left ventricular hypertrophy. Indeterminate diastolic filling due to E-A fusion. Elevated left ventricular end-diastolic pressure.  LV Wall Scoring: There is diffuse hypokinesis. Right Ventricle: The right ventricular size is normal. No increase in right ventricular wall thickness. Right ventricular systolic function is normal. There is moderately elevated pulmonary artery systolic pressure. The tricuspid regurgitant velocity is 2.78 m/s, and with an assumed right atrial pressure of 15 mmHg, the estimated right ventricular systolic pressure is 45.9 mmHg. Left Atrium: Left atrial size was mildly dilated. Right Atrium: Right atrial size was normal in size. Pericardium: There is no evidence of pericardial effusion. Mitral Valve: The mitral valve is normal in structure. Mild mitral valve regurgitation. No evidence of mitral valve stenosis. Tricuspid Valve: The tricuspid valve is normal in structure. Tricuspid valve regurgitation is mild . No evidence of tricuspid stenosis. Aortic Valve: The aortic valve is normal in structure. Aortic valve regurgitation is not visualized. No aortic stenosis is present. Pulmonic Valve: The pulmonic valve was normal in structure. Pulmonic valve regurgitation is trivial. No evidence of pulmonic stenosis. Aorta: The aortic root is normal in size and structure. Venous: The inferior vena cava is dilated in size with less than 50% respiratory variability, suggesting right atrial pressure of 15 mmHg. IAS/Shunts: No  atrial level shunt detected by color flow Doppler.  LEFT VENTRICLE PLAX 2D LVIDd:         6.70 cm      Diastology LVIDs:         6.40 cm      LV e' medial:    4.88 cm/s LV PW:         1.10 cm      LV E/e' medial:  21.0 LV IVS:        1.00 cm      LV e' lateral:   11.20 cm/s LVOT diam:     2.60 cm      LV E/e' lateral: 9.2 LV SV:         44 LV SV Index:   17 LVOT Area:     5.31 cm  LV Volumes (MOD) LV vol d, MOD A2C: 274.0 ml LV vol d, MOD A4C: 227.0 ml LV vol s, MOD A2C: 227.0 ml LV vol s, MOD A4C: 204.0 ml LV SV MOD A2C:     47.0 ml LV SV MOD A4C:     227.0 ml LV SV MOD BP:      30.3 ml RIGHT VENTRICLE            IVC RV S prime:     9.14 cm/s  IVC diam: 2.90 cm TAPSE (M-mode): 1.4 cm LEFT ATRIUM             Index        RIGHT ATRIUM           Index LA diam:        5.50 cm 2.07 cm/m   RA Area:     21.00 cm LA Vol (A2C):   48.4 ml 18.22 ml/m  RA Volume:   61.50 ml  23.15 ml/m LA Vol (A4C):   88.0 ml 33.13 ml/m LA Biplane Vol: 67.2 ml 25.30 ml/m  AORTIC VALVE LVOT Vmax:   61.20 cm/s LVOT Vmean:  39.533 cm/s LVOT VTI:    0.083 m  AORTA Ao Root diam: 3.50 cm Ao Asc diam:  3.50 cm MITRAL VALVE                TRICUSPID VALVE MV Area (PHT): 6.63 cm     TR Peak grad:   30.9 mmHg MV Decel Time: 115 msec     TR Vmax:        278.00 cm/s MV E velocity: 102.50 cm/s                             SHUNTS                             Systemic VTI:  0.08 m                             Systemic Diam: 2.60 cm Chilton Si MD Electronically signed by Chilton Si MD Signature Date/Time: 05/22/2021/4:39:42 PM    Final     Discharge Medications   Allergies as of 05/23/2021   No Known Allergies      Medication List     STOP taking these medications    Descovy 200-25 MG tablet Generic drug: emtricitabine-tenofovir AF   Entresto 97-103 MG Generic drug: sacubitril-valsartan Replaced by: sacubitril-valsartan 24-26 MG       TAKE these medications    carvedilol 3.125 MG tablet Commonly known as: COREG Take  1 tablet (3.125 mg total) by mouth 2 (two) times daily with a meal. What changed:  medication strength how much to take   digoxin 0.125 MG tablet Commonly known as: LANOXIN Take 1 tablet (0.125 mg total) by mouth daily.   Farxiga 10 MG Tabs tablet Generic drug: dapagliflozin propanediol Take 1 tablet (10 mg total) by mouth daily before breakfast.   furosemide 40 MG tablet Commonly known as: Lasix Take 1 tablet (40 mg total) by mouth 2 (two) times daily. Start taking on: May 24, 2021 What changed:  medication strength See the new instructions.   potassium chloride SA 20 MEQ tablet Commonly known as: KLOR-CON M Take 3 tablets (60 mEq total) by mouth daily. Start taking on: May 24, 2021 What changed: how much to take   sacubitril-valsartan 24-26 MG Commonly known as: ENTRESTO Take 1 tablet by mouth 2 (two) times daily. Replaces: Entresto 97-103 MG   sacubitril-valsartan 24-26 MG Commonly known as: ENTRESTO Take 1 tablet by mouth 2 (two) times daily.   spironolactone 25 MG tablet Commonly known as: ALDACTONE Take 1 tablet (25 mg total) by mouth daily.        Disposition   The patient will be discharged in stable condition to home. Discharge Instructions     Diet - low sodium heart healthy   Complete by: As directed    Heart Failure patients record your daily weight using the same scale at the same time of day   Complete by: As directed    Increase activity slowly   Complete by: As directed        Follow-up Information     [facility] Follow up.   Specialty: Cardiology Why: 06/02/21 at 9:00 AM  The [facility] at [facility], Doran Stabler information: [address] [id number] mc [place] [address] [phone]  Duration of Discharge Encounter: Greater than 35 minutes   Signed, Maximino Sarin, PA-C  05/23/2021, 10:41  AM  Patient seen with PA, agree with the above discharge plan.  Volume status improved.   He will need close followup in CHF clinic.   Marca Ancona 05/23/2021 1:18 PM

## 2021-05-22 NOTE — Progress Notes (Signed)
°  Echocardiogram 2D Echocardiogram has been performed.  Donald Murray 05/22/2021, 11:27 AM

## 2021-05-22 NOTE — Progress Notes (Addendum)
HF CSW reached out to [employer] to see about following up on the Medicaid screen completed on 05/21/21 to see if he will be referred to a Physiological scientist as Mr. Schinke doesn't appear to have health insurance. May have active insurance in January 2023? [employer] reported that the patient doesn't appear to qualify for Medicaid at this time but the Financial Navigator has spoken to him about [facility] Financial assistance to help with his bills.    Patient will benefit from PT/OT consult for disposition recommendations.   Chessa Barrasso, MSW, LCSWA [phone] Heart Failure Social Worker

## 2021-05-22 NOTE — Progress Notes (Addendum)
Advanced Heart Failure Rounding Note  PCP-Cardiologist: Olga Millers, MD   Subjective:    Good responses to IV Lasix, 5L in UOP. Wt down 10 lb. BP stable   SCr stable, 1.2>>1.3   PVCs ~10/min  K 4.2  Mg 2.0   Feels ok today but difficulties sleeping last night due to orthopnea. No chest pain.  Echo pending    Objective:   Weight Range: (!) 156.2 kg Body mass index is 48.03 kg/m.   Vital Signs:   Temp:  [97.6 F (36.4 C)-98.1 F (36.7 C)] 97.8 F (36.6 C) (12/29 0747) Pulse Rate:  [33-103] 97 (12/29 0841) Resp:  [13-34] 20 (12/29 0747) BP: (91-125)/(42-99) 109/42 (12/29 0747) SpO2:  [90 %-100 %] 99 % (12/29 0748) Weight:  [156.2 kg-158.2 kg] 156.2 kg (12/29 0533)    Weight change: Filed Weights   05/21/21 0651 05/21/21 1338 05/22/21 0533  Weight: (!) 160.6 kg (!) 158.2 kg (!) 156.2 kg    Intake/Output:   Intake/Output Summary (Last 24 hours) at 05/22/2021 0912 Last data filed at 05/22/2021 0748 Gross per 24 hour  Intake 1080 ml  Output 4675 ml  Net -3595 ml      Physical Exam    General:  Well appearing. No resp difficulty HEENT: Normal Neck: Supple. JVP to jaw . Carotids 2+ bilat; no bruits. No lymphadenopathy or thyromegaly appreciated. Cor: PMI nondisplaced. Regular rate & rhythm. No rubs, gallops or murmurs. Lungs: decreased BS at the bases bilaterally  Abdomen: Soft, nontender, nondistended. No hepatosplenomegaly. No bruits or masses. Good bowel sounds. Extremities: No cyanosis, clubbing, rash, 1+ LE edema Neuro: Alert & orientedx3, cranial nerves grossly intact. moves all 4 extremities w/o difficulty. Affect pleasant   Telemetry   NSR 90s w/ PVCs ~10/min   EKG    No new EKG to review   Labs    CBC Recent Labs    05/21/21 0457 05/21/21 1339 05/22/21 0343  WBC 10.3 10.2 10.8*  NEUTROABS 7.7  --   --   HGB 13.8 13.9 13.7  HCT 43.2 44.2 43.2  MCV 83.7 85.3 83.2  PLT 295 286 293   Basic Metabolic Panel Recent Labs     05/21/21 0457 05/21/21 1339 05/22/21 0343  NA 138  --  137  K 3.9  --  4.2  CL 105  --  104  CO2 23  --  22  GLUCOSE 98  --  96  BUN 20  --  24*  CREATININE 1.17 1.19 1.33*  CALCIUM 8.9  --  8.5*  MG  --   --  2.0   Liver Function Tests Recent Labs    05/21/21 0457  AST 25  ALT 28  ALKPHOS 70  BILITOT 0.4  PROT 6.7  ALBUMIN 3.7   No results for input(s): LIPASE, AMYLASE in the last 72 hours. Cardiac Enzymes No results for input(s): CKTOTAL, CKMB, CKMBINDEX, TROPONINI in the last 72 hours.  BNP: BNP (last 3 results) Recent Labs    01/10/21 1014 05/04/21 0515 05/21/21 0457  BNP 278.0* 1,017.9* 1,141.2*    ProBNP (last 3 results) No results for input(s): PROBNP in the last 8760 hours.   D-Dimer No results for input(s): DDIMER in the last 72 hours. Hemoglobin A1C No results for input(s): HGBA1C in the last 72 hours. Fasting Lipid Panel No results for input(s): CHOL, HDL, LDLCALC, TRIG, CHOLHDL, LDLDIRECT in the last 72 hours. Thyroid Function Tests No results for input(s): TSH, T4TOTAL, T3FREE, THYROIDAB in the  last 72 hours.  Invalid input(s): FREET3  Other results:   Imaging    No results found.   Medications:     Scheduled Medications:  dapagliflozin propanediol  10 mg Oral Daily   digoxin  0.125 mg Oral Daily   enoxaparin (LOVENOX) injection  80 mg Subcutaneous Q24H   furosemide  80 mg Intravenous BID   sodium chloride flush  3 mL Intravenous Q12H   spironolactone  25 mg Oral Daily    Infusions:  sodium chloride      PRN Medications: sodium chloride, acetaminophen, ondansetron (ZOFRAN) IV, sodium chloride flush   Assessment/Plan   1. Acute on chronic systolic CHF: Low EF known since 9/21.  Last echo in 8/22 with EF 20-25%, severe LV dysfunction, Grade II DD, RV moderately reduced.  Cause uncertain. No heavy ETOH or drugs that commonly cause cardiomyopathy.  No strong family history.  He does smoke and has history of HTN, but BP now  on the low side. Concern at one point for PVC-mediated CMP, but only 4.3% on last monitor.  Now admitted w/ volume overloaded with NYHA class IIIb symptoms. Good response to IV Lasix. Wt trending down. Remains fluid overloaded - Continue Lasix 80 mg IV bid.  - Continue digoxin 0.125 - Continue dapagliflozin 10 daily.  - Continue spironolactone 25 daily.  - Hold Entresto with soft BP, hopefully restart soon.  - Fluid restrict.  - Eventually will need RHC/LHC.  If no CAD, will need cardiac MRI.  2. Suspect OSA: Strong suspicion, stops breathing at night.  Will need sleep study when insurance kicks in.  3. Active smoker: Recommended cessation.  4. PVCs: Zio 12/21 4.3% PVCs - probably not high enough burden to cause CM.  - c/w PVCs on tele ~10/min. Monitor - keep K >4.0 and Mg >2.0  - needs eventual sleep study   Length of Stay: 1  Donald Simmons, PA-C  05/22/2021, 9:12 AM  Advanced Heart Failure Team Pager [phone] (M-F; 7a - 5p)  Please contact [facility] for night-coverage after hours (5p -7a ) and weekends on [email or web address]  Patient seen with PA, agree with the above note.   Good diuresis yesterday, weight coming down.  Still notes orthopnea.  BP stable.   General: NAD Neck: JVP 12 cm, no thyromegaly or thyroid nodule.  Lungs: Clear to auscultation bilaterally with normal respiratory effort. CV: Nondisplaced PMI.  Heart regular S1/S2, no S3/S4, no murmur.  No peripheral edema.   Abdomen: Soft, nontender, no hepatosplenomegaly, no distention.  Skin: Intact without lesions or rashes.  Neurologic: Alert and oriented x 3.  Psych: Normal affect. Extremities: No clubbing or cyanosis.  HEENT: Normal.   Still some volume overload but improving.  Creatinine 1.3.   - Continue Lasix 80 mg IV bid today.  - Add back Entresto 24/26 bid.  - Continue spironolactone, digoxin, Farxiga.  - Repeat echo today.  - We discussed RHC/LHC for tomorrow, need to rule out CAD.  He says that he  really has to leave tomorrow because it is his birthday and people have come into town for it.  Therefore, will diurese him further overnight, give a dose of IV Lasix tomorrow morning, then likely let him go home.  He will have to followup for cath as outpatient.   Donald Murray 05/22/2021 3:53 PM

## 2021-05-23 ENCOUNTER — Telehealth (HOSPITAL_COMMUNITY): Payer: Self-pay | Admitting: Licensed Clinical Social Worker

## 2021-05-23 ENCOUNTER — Other Ambulatory Visit (HOSPITAL_COMMUNITY): Payer: Self-pay

## 2021-05-23 LAB — BASIC METABOLIC PANEL
Anion gap: 9 (ref 5–15)
BUN: 23 mg/dL — ABNORMAL HIGH (ref 6–20)
CO2: 26 mmol/L (ref 22–32)
Calcium: 8.4 mg/dL — ABNORMAL LOW (ref 8.9–10.3)
Chloride: 102 mmol/L (ref 98–111)
Creatinine, Ser: 1.29 mg/dL — ABNORMAL HIGH (ref 0.61–1.24)
GFR, Estimated: >60 mL/min (ref >60)
Glucose, Bld: 91 mg/dL (ref 70–99)
Potassium: 3.9 mmol/L (ref 3.5–5.1)
Sodium: 137 mmol/L (ref 135–145)

## 2021-05-23 LAB — MAGNESIUM: Magnesium: 2.5 mg/dL — ABNORMAL HIGH (ref 1.7–2.4)

## 2021-05-23 MED ORDER — SACUBITRIL-VALSARTAN 24-26 MG PO TABS: 1.0000 | ORAL_TABLET | Freq: Two times a day (BID) | ORAL | 3 refills | Status: DC

## 2021-05-23 MED ORDER — SACUBITRIL-VALSARTAN 24-26 MG PO TABS
1.0000 | ORAL_TABLET | Freq: Two times a day (BID) | ORAL | 3 refills | Status: DC
Start: 2021-05-24 — End: 2021-06-02

## 2021-05-23 MED ORDER — POTASSIUM CHLORIDE CRYS ER 20 MEQ PO TBCR
40.0000 meq | EXTENDED_RELEASE_TABLET | Freq: Once | ORAL | Status: AC
  Administered 2021-05-23: 08:00:00 40 meq via ORAL
  Filled 2021-05-23: qty 2

## 2021-05-23 MED ORDER — SACUBITRIL-VALSARTAN 24-26 MG PO TABS: 1.0000 | ORAL_TABLET | Freq: Two times a day (BID) | ORAL | Status: DC

## 2021-05-23 MED ORDER — POTASSIUM CHLORIDE CRYS ER 20 MEQ PO TBCR
60.0000 meq | EXTENDED_RELEASE_TABLET | Freq: Every day | ORAL | 3 refills | Status: DC
  Filled 2021-05-23: qty 60, 20d supply, fill #0

## 2021-05-23 MED ORDER — FUROSEMIDE 40 MG PO TABS
40.0000 mg | ORAL_TABLET | Freq: Two times a day (BID) | ORAL | 3 refills | Status: DC
  Filled 2021-05-23: qty 60, 30d supply, fill #0

## 2021-05-23 MED ORDER — CARVEDILOL 3.125 MG PO TABS
3.1250 mg | ORAL_TABLET | Freq: Two times a day (BID) | ORAL | 3 refills | Status: DC
  Filled 2021-05-23: qty 60, 30d supply, fill #0

## 2021-05-23 NOTE — Progress Notes (Addendum)
Notified had SBP in upper 80s prior to discharge.  Reviewed with Dr. Shirlee Latch. Will hold coreg at discharge and hold tonight's dose of entresto.  BP monitoring device being delivered to his home tomorrow.  Patient departed before medication updates could be relayed to him. He will be called at home to review changes.

## 2021-05-23 NOTE — Plan of Care (Signed)

## 2021-05-23 NOTE — Progress Notes (Addendum)
Advanced Heart Failure Rounding Note  PCP-Cardiologist: Olga Millers, MD   Subjective:    Continues to diuresis w/ IV Lasix, additional 5.6L in UOP yesterday. Overall net negative 8L this admit. Wt down 16 lb.   Still slightly above previous baseline dry wt but overall feeling better. No resting dyspnea. LEE much improved. Denies CP.   BP soft, low 90s systolic (prior to meds). No orthostatic symptoms.   SCr stable, 1.2>>1.3>1.3   Fever PVCs on tele, ~6/min K 3.9 Mg 2.5   Echo severely reduced LVEF <20% w/ global HK, no visible thrombus, + mild MR, RV normal  Wants to finish w/u and do cath as outpatient.   Objective:   Weight Range: (!) 153.3 kg Body mass index is 47.14 kg/m.   Vital Signs:   Temp:  [97.8 F (36.6 C)-98.3 F (36.8 C)] 98.1 F (36.7 C) (12/30 0417) Pulse Rate:  [33-102] 97 (12/30 0417) Resp:  [18-22] 18 (12/30 0417) BP: (96-123)/(42-96) 96/77 (12/30 0423) SpO2:  [99 %-100 %] 100 % (12/30 0417) Weight:  [153.3 kg] 153.3 kg (12/30 0024) Last BM Date: 05/21/21  Weight change: Filed Weights   05/21/21 1338 05/22/21 0533 05/23/21 0024  Weight: (!) 158.2 kg (!) 156.2 kg (!) 153.3 kg    Intake/Output:   Intake/Output Summary (Last 24 hours) at 05/23/2021 0732 Last data filed at 05/23/2021 0400 Gross per 24 hour  Intake 1660 ml  Output 5575 ml  Net -3915 ml      Physical Exam    General:  Well appearing. No respiratory difficulty HEENT: normal Neck: supple. Thick neck, JVD ~9 cm. Carotids 2+ bilat; no bruits. No lymphadenopathy or thyromegaly appreciated. Cor: PMI nondisplaced. Regular rate & rhythm. No rubs, gallops or murmurs. Lungs: clear Abdomen: soft, nontender, nondistended. No hepatosplenomegaly. No bruits or masses. Good bowel sounds. Extremities: no cyanosis, clubbing, rash, trace bilateral LE edema Neuro: alert & oriented x 3, cranial nerves grossly intact. moves all 4 extremities w/o difficulty. Affect  pleasant.   Telemetry   NSR 90s w/ occasional PVCs ~6/min   EKG    No new EKG to review   Labs    CBC Recent Labs    05/21/21 0457 05/21/21 1339 05/22/21 0343  WBC 10.3 10.2 10.8*  NEUTROABS 7.7  --   --   HGB 13.8 13.9 13.7  HCT 43.2 44.2 43.2  MCV 83.7 85.3 83.2  PLT 295 286 293   Basic Metabolic Panel Recent Labs    94/17/40 0343 05/23/21 0322  NA 137 137  K 4.2 3.9  CL 104 102  CO2 22 26  GLUCOSE 96 91  BUN 24* 23*  CREATININE 1.33* 1.29*  CALCIUM 8.5* 8.4*  MG 2.0 2.5*   Liver Function Tests Recent Labs    05/21/21 0457  AST 25  ALT 28  ALKPHOS 70  BILITOT 0.4  PROT 6.7  ALBUMIN 3.7   No results for input(s): LIPASE, AMYLASE in the last 72 hours. Cardiac Enzymes No results for input(s): CKTOTAL, CKMB, CKMBINDEX, TROPONINI in the last 72 hours.  BNP: BNP (last 3 results) Recent Labs    01/10/21 1014 05/04/21 0515 05/21/21 0457  BNP 278.0* 1,017.9* 1,141.2*    ProBNP (last 3 results) No results for input(s): PROBNP in the last 8760 hours.   D-Dimer No results for input(s): DDIMER in the last 72 hours. Hemoglobin A1C No results for input(s): HGBA1C in the last 72 hours. Fasting Lipid Panel No results for input(s): CHOL, HDL, LDLCALC,  TRIG, CHOLHDL, LDLDIRECT in the last 72 hours. Thyroid Function Tests No results for input(s): TSH, T4TOTAL, T3FREE, THYROIDAB in the last 72 hours.  Invalid input(s): FREET3  Other results:   Imaging    ECHOCARDIOGRAM COMPLETE  Result Date: 05/22/2021    ECHOCARDIOGRAM REPORT   Patient Name:   Donald Murray Date of Exam: 05/22/2021 Medical Rec #:  [id number]     Height:       71.0 in Accession #:    [id number]    Weight:       344.4 lb Date of Birth:  January 04, 1986    BSA:          2.656 m Patient Age:    35 years      BP:           109/42 mmHg Patient Gender: M             HR:           98 bpm. Exam Location:  Inpatient Procedure: 2D Echo, Cardiac Doppler, Color Doppler and Intracardiac             Opacification Agent Indications:    I50.40* Unspecified combined systolic (congestive) and diastolic                 (congestive) heart failure  History:        Patient has prior history of Echocardiogram examinations, most                 recent 01/09/2021. CHF, Abnormal ECG, Arrythmias:Tachycardia and                 PVC, Signs/Symptoms:Shortness of Breath and Dyspnea; Risk                 Factors:Current Smoker and Hypertension. IVDU. Heroin use.  Sonographer:    Roseanna Rainbow RDCS Referring Phys: [id number] [facility]  Sonographer Comments: Technically difficult study due to poor echo windows and patient is morbidly obese. Image acquisition challenging due to patient body habitus. IMPRESSIONS  1. Severe global hypokinesis. Left ventricular ejection fraction, by estimation, is <20%. The left ventricle has severely decreased function. The left ventricle demonstrates global hypokinesis. The left ventricular internal cavity size was moderately dilated. Indeterminate diastolic filling due to E-A fusion. Elevated left ventricular end-diastolic pressure.  2. Right ventricular systolic function is normal. The right ventricular size is normal. There is moderately elevated pulmonary artery systolic pressure.  3. Left atrial size was mildly dilated.  4. The mitral valve is normal in structure. Mild mitral valve regurgitation. No evidence of mitral stenosis.  5. The aortic valve is normal in structure. Aortic valve regurgitation is not visualized. No aortic stenosis is present.  6. The inferior vena cava is dilated in size with <50% respiratory variability, suggesting right atrial pressure of 15 mmHg. FINDINGS  Left Ventricle: Severe global hypokinesis. Left ventricular ejection fraction, by estimation, is <20%. The left ventricle has severely decreased function. The left ventricle demonstrates global hypokinesis. Definity contrast agent was given IV to delineate the left ventricular endocardial borders. The left ventricular  internal cavity size was moderately dilated. There is no left ventricular hypertrophy. Indeterminate diastolic filling due to E-A fusion. Elevated left ventricular end-diastolic pressure.  LV Wall Scoring: There is diffuse hypokinesis. Right Ventricle: The right ventricular size is normal. No increase in right ventricular wall thickness. Right ventricular systolic function is normal. There is moderately elevated pulmonary artery systolic pressure. The tricuspid regurgitant velocity is 2.78 m/s,  and with an assumed right atrial pressure of 15 mmHg, the estimated right ventricular systolic pressure is 123XX123 mmHg. Left Atrium: Left atrial size was mildly dilated. Right Atrium: Right atrial size was normal in size. Pericardium: There is no evidence of pericardial effusion. Mitral Valve: The mitral valve is normal in structure. Mild mitral valve regurgitation. No evidence of mitral valve stenosis. Tricuspid Valve: The tricuspid valve is normal in structure. Tricuspid valve regurgitation is mild . No evidence of tricuspid stenosis. Aortic Valve: The aortic valve is normal in structure. Aortic valve regurgitation is not visualized. No aortic stenosis is present. Pulmonic Valve: The pulmonic valve was normal in structure. Pulmonic valve regurgitation is trivial. No evidence of pulmonic stenosis. Aorta: The aortic root is normal in size and structure. Venous: The inferior vena cava is dilated in size with less than 50% respiratory variability, suggesting right atrial pressure of 15 mmHg. IAS/Shunts: No atrial level shunt detected by color flow Doppler.  LEFT VENTRICLE PLAX 2D LVIDd:         6.70 cm      Diastology LVIDs:         6.40 cm      LV e' medial:    4.88 cm/s LV PW:         1.10 cm      LV E/e' medial:  21.0 LV IVS:        1.00 cm      LV e' lateral:   11.20 cm/s LVOT diam:     2.60 cm      LV E/e' lateral: 9.2 LV SV:         44 LV SV Index:   17 LVOT Area:     5.31 cm  LV Volumes (MOD) LV vol d, MOD A2C: 274.0 ml  LV vol d, MOD A4C: 227.0 ml LV vol s, MOD A2C: 227.0 ml LV vol s, MOD A4C: 204.0 ml LV SV MOD A2C:     47.0 ml LV SV MOD A4C:     227.0 ml LV SV MOD BP:      30.3 ml RIGHT VENTRICLE            IVC RV S prime:     9.14 cm/s  IVC diam: 2.90 cm TAPSE (M-mode): 1.4 cm LEFT ATRIUM             Index        RIGHT ATRIUM           Index LA diam:        5.50 cm 2.07 cm/m   RA Area:     21.00 cm LA Vol (A2C):   48.4 ml 18.22 ml/m  RA Volume:   61.50 ml  23.15 ml/m LA Vol (A4C):   88.0 ml 33.13 ml/m LA Biplane Vol: 67.2 ml 25.30 ml/m  AORTIC VALVE LVOT Vmax:   61.20 cm/s LVOT Vmean:  39.533 cm/s LVOT VTI:    0.083 m  AORTA Ao Root diam: 3.50 cm Ao Asc diam:  3.50 cm MITRAL VALVE                TRICUSPID VALVE MV Area (PHT): 6.63 cm     TR Peak grad:   30.9 mmHg MV Decel Time: 115 msec     TR Vmax:        278.00 cm/s MV E velocity: 102.50 cm/s  SHUNTS                             Systemic VTI:  0.08 m                             Systemic Diam: 2.60 cm Skeet Latch MD Electronically signed by Skeet Latch MD Signature Date/Time: 05/22/2021/4:39:42 PM    Final      Medications:     Scheduled Medications:  dapagliflozin propanediol  10 mg Oral Daily   digoxin  0.125 mg Oral Daily   enoxaparin (LOVENOX) injection  80 mg Subcutaneous Q24H   furosemide  80 mg Intravenous BID   sacubitril-valsartan  1 tablet Oral BID   sodium chloride flush  3 mL Intravenous Q12H   spironolactone  25 mg Oral Daily    Infusions:  sodium chloride      PRN Medications: sodium chloride, acetaminophen, ondansetron (ZOFRAN) IV, sodium chloride flush   Assessment/Plan   1. Acute on chronic systolic CHF: Low EF known since 9/21.  Last echo in 8/22 with EF 20-25%, severe LV dysfunction, Grade II DD, RV moderately reduced.  Cause uncertain. No heavy ETOH or drugs that commonly cause cardiomyopathy.  No strong family history.  He does smoke and has history of HTN, but BP now on the low side.  Concern at one point for PVC-mediated CMP, but only 4.3% on last monitor.  Now admitted w/ volume overloaded with NYHA class IIIb symptoms. Echo this admit w/ severely reduced LVEF <20% w/ global HK, no visible thrombus, + mild MR, RV normal. Good response to IV Lasix + symptomatic improvement. Wt down 16 lb but still slightly above baseline dry wt. Renal function stable.  - Give another dose of IV Lasix 80 mg and supp K  - Continue digoxin 0.125 - Continue dapagliflozin 10 daily.  - Continue spironolactone 25 daily.  - Hold Entresto with soft BP (SBPs low 90s) - Fluid restrict.  - Eventually will need RHC/LHC, will plan to complete as outpatient.  If no CAD, will need cardiac MRI.  2. Suspect OSA: Strong suspicion, stops breathing at night.  Will need sleep study when insurance kicks in.  3. Active smoker: Recommended cessation.  4. PVCs: Zio 12/21 4.3% PVCs - probably not high enough burden to cause CM.  - c/w PVCs on tele 6/min.  - keep K >4.0 and Mg >2.0  - needs eventual sleep study   We discussed staying for inpatient RHC/LHC, need to rule out CAD.  He says that he really has to leave because it is his birthday and people have come into town for it. Will give another dose of IV Lasix this AM prior to d/c today. He will have to followup for cath as outpatient.  Length of Stay: 2  Lyda Jester, PA-C  05/23/2021, 7:32 AM  Advanced Heart Failure Team Pager [phone] (M-F; 7a - 5p)  Please contact [facility] for night-coverage after hours (5p -7a ) and weekends on [email or web address]  Patient seen with PA, agree with the above note.   He diuresed well again overnight, I/Os net negative 3915.  Weight down.  Breathing improved.  SBP 90s-100s.    General: NAD Neck: JVP 8 cm, no thyromegaly or thyroid nodule.  Lungs: Clear to auscultation bilaterally with normal respiratory effort. CV: Nondisplaced PMI.  Heart regular S1/S2, no S3/S4, no murmur.  Trace ankle edema.  Abdomen: Soft,  nontender, no hepatosplenomegaly, no distention.  Skin: Intact without lesions or rashes.  Neurologic: Alert and oriented x 3.  Psych: Normal affect. Extremities: No clubbing or cyanosis.  HEENT: Normal.   Patient wants to go home today, it is his birthday and has people who have come in town to see him.  Needs LHC/RHC to rule out coronary disease as cause of CMP.  Not urgent, he can do as outpatient.   Volume status improved, will send home on higher dose Lasix 40 mg po bid.   Continue digoxin 0.125, dapagliflozin 10 mg daily, and spironolactone 25 mg daily.  He supposedly has been taking Entresto 97/103 bid and Coreg 12.5 mg bid at home, but with blood pressures he is running here, I am concerned that he has not been compliant with meds.  Will hold Coreg for now, send home on Entresto 24/26 bid starting tomorrow.  He will need close followup in CHF clinic.   Loralie Champagne 05/23/2021 12:46 PM

## 2021-05-23 NOTE — Telephone Encounter (Signed)
Consulted by inpatient team to assist pt with getting BP cuff.  Is not financially capable of purchasing himself and has very concerning BPs at this time.  CSW able to help order- will arrive at pt home tomorrow  Burna Sis, LCSW Clinical Social Worker Advanced [facility] Failure Clinic Desk#: [phone] Cell#: [phone]

## 2021-05-30 ENCOUNTER — Telehealth (HOSPITAL_COMMUNITY): Payer: Self-pay

## 2021-05-30 NOTE — Telephone Encounter (Signed)
Called to confirm/remind patient of their appointment at the Advanced Heart Failure Clinic on 06/02/21.  ° °Patient reminded to bring all medications and/or complete list. ° °Confirmed patient has transportation. Gave directions, instructed to utilize valet parking. ° °Confirmed appointment prior to ending call.  ° °

## 2021-06-02 ENCOUNTER — Other Ambulatory Visit (HOSPITAL_COMMUNITY): Payer: Self-pay

## 2021-06-02 ENCOUNTER — Encounter (HOSPITAL_COMMUNITY): Payer: Self-pay

## 2021-06-02 ENCOUNTER — Other Ambulatory Visit: Payer: Self-pay

## 2021-06-02 ENCOUNTER — Ambulatory Visit (HOSPITAL_COMMUNITY)
Admission: RE | Admit: 2021-06-02 | Discharge: 2021-06-02 | Disposition: A | Discharge status: Home / Self Care | Payer: BC Managed Care – PPO | Source: Ambulatory Visit | Attending: Family Medicine | Admitting: Family Medicine

## 2021-06-02 VITALS — BP 118/74 | HR 89 | Wt 347.0 lb

## 2021-06-02 DIAGNOSIS — I5022 Chronic systolic (congestive) heart failure: Secondary | ICD-10-CM | POA: Diagnosis not present

## 2021-06-02 DIAGNOSIS — I110 Hypertensive heart disease with heart failure: Secondary | ICD-10-CM | POA: Insufficient documentation

## 2021-06-02 DIAGNOSIS — F17210 Nicotine dependence, cigarettes, uncomplicated: Secondary | ICD-10-CM | POA: Diagnosis not present

## 2021-06-02 DIAGNOSIS — R29818 Other symptoms and signs involving the nervous system: Secondary | ICD-10-CM

## 2021-06-02 DIAGNOSIS — I493 Ventricular premature depolarization: Secondary | ICD-10-CM | POA: Diagnosis not present

## 2021-06-02 DIAGNOSIS — Z6841 Body Mass Index (BMI) 40.0 and over, adult: Secondary | ICD-10-CM | POA: Diagnosis not present

## 2021-06-02 DIAGNOSIS — Z9114 Patient's other noncompliance with medication regimen: Secondary | ICD-10-CM

## 2021-06-02 DIAGNOSIS — I10 Essential (primary) hypertension: Secondary | ICD-10-CM

## 2021-06-02 DIAGNOSIS — Z720 Tobacco use: Secondary | ICD-10-CM

## 2021-06-02 LAB — CBC
HCT: 47.7 % (ref 39.0–52.0)
Hemoglobin: 15.1 g/dL (ref 13.0–17.0)
MCH: 26.5 pg (ref 26.0–34.0)
MCHC: 31.7 g/dL (ref 30.0–36.0)
MCV: 83.7 fL (ref 80.0–100.0)
Platelets: 280 10*3/uL (ref 150–400)
RBC: 5.7 MIL/uL (ref 4.22–5.81)
RDW: 17.7 % — ABNORMAL HIGH (ref 11.5–15.5)
WBC: 9.1 10*3/uL (ref 4.0–10.5)
nRBC: 0 % (ref 0.0–0.2)

## 2021-06-02 LAB — BASIC METABOLIC PANEL
Anion gap: 9 (ref 5–15)
BUN: 20 mg/dL (ref 6–20)
CO2: 21 mmol/L — ABNORMAL LOW (ref 22–32)
Calcium: 8.5 mg/dL — ABNORMAL LOW (ref 8.9–10.3)
Chloride: 107 mmol/L (ref 98–111)
Creatinine, Ser: 1 mg/dL (ref 0.61–1.24)
GFR, Estimated: >60 mL/min (ref >60)
Glucose, Bld: 93 mg/dL (ref 70–99)
Potassium: 4.1 mmol/L (ref 3.5–5.1)
Sodium: 137 mmol/L (ref 135–145)

## 2021-06-02 LAB — DIGOXIN LEVEL: Digoxin Level: 0.4 ng/mL — ABNORMAL LOW (ref 0.8–2.0)

## 2021-06-02 LAB — MAGNESIUM: Magnesium: 2.2 mg/dL (ref 1.7–2.4)

## 2021-06-02 LAB — BRAIN NATRIURETIC PEPTIDE: B Natriuretic Peptide: 322.6 pg/mL — ABNORMAL HIGH (ref 0.0–100.0)

## 2021-06-02 MED ORDER — ENTRESTO 49-51 MG PO TABS
1.0000 | ORAL_TABLET | Freq: Two times a day (BID) | ORAL | 4 refills | Status: DC
  Filled 2021-06-02 (×2): qty 60, 30d supply, fill #0

## 2021-06-02 NOTE — Progress Notes (Signed)
ADVANCED HF CLINIC NOTE  Primary Care: Pcp, No Primary Cardiologist: Kirk Ruths, MD HF Cardiologist: Dr. Haroldine Laws  HPI: Donald Murray is a  36 y.o. male with HTN, IVDA (heroin), tobacco use, morbid obesity and systolic HF (onset 0000000).  Echo 10/20 EF 60-65%   Admitted to [facility] 9/21 with new onset HF in setting of severe HTN 174/128.Marland Kitchen ECG with sinus tach and frequent PVCs.   Echo 02/16/20: EF 20-25% Moderate RV dysfunction.   Seen by AHF in HF Consultation for the first time 04/24/20. 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 Lasix and Entresto increased. Digoxin added. Zio placed to quantify PVC burden - 4.3%. Zio 12/21 Sinus - average HR 104, 4.3% PVCs.  Clinic visit 12/21 doing well, Entresto was increased.  He no showed his follow up appointment in late February 2022.    Seen in hospital 08/15/20 with medication and dietary non-adherence leading to fluid retention and he was having trouble sleeping, getting short of breath at night and having PND.  He was restarted on GDMT and given IV lasix 60mg  x3 doses responded well and was discharged the following day.    Admitted 8/22 with A/C CHF, diuresed with IV lasix. Repeat echo with EF 20-25%, RV moderately reduced. Exacerbation attributed to medication non-compliance and increase fluid intake. Discharge weight 327 lbs.  No showed his follow-up in HF clinic 09/22.    Admitted 12/22 with A/C CHF exacerbation. Diuresed with IV lasix. AHF consulted. Echo showed severely reduced LVEF <20% w/ global HK, no visible thrombus, + mild MR, RV normal. R/LHC was recommended after diuresis to r/o CAD however pt requested for cath to be completed as outpatient. He was discharged home, weight 338 lbs.   Today he returns for post hospital HF follow up. Overall feeling fine. 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 He is not SOB at work. Denies CP, dizziness, edema, or PND/Orthopnea. Appetite ok. No fever or chills. Not  weighing at home. Taking all medications. Smoking 1/2 ppd. No ETOH or drug use.  Past Medical History:  Diagnosis Date   CHF (congestive heart failure) (HCC)    Hypertension    Obesity    Snoring    Current Outpatient Medications  Medication Sig Dispense Refill   dapagliflozin propanediol (FARXIGA) 10 MG TABS tablet Take 1 tablet (10 mg total) by mouth daily before breakfast. 30 tablet 11   digoxin (LANOXIN) 0.125 MG tablet Take 1 tablet (0.125 mg total) by mouth daily. 30 tablet 2   furosemide (LASIX) 40 MG tablet Take 1 tablet (40 mg total) by mouth 2 (two) times daily. 60 tablet 3   potassium chloride SA (KLOR-CON M) 20 MEQ tablet Take 3 tablets (60 mEq total) by mouth daily. 60 tablet 3   sacubitril-valsartan (ENTRESTO) 24-26 MG Take 1 tablet by mouth 2 (two) times daily. 180 tablet 3   spironolactone (ALDACTONE) 25 MG tablet Take 1 tablet (25 mg total) by mouth daily. 30 tablet 2   No current facility-administered medications for this encounter.   No Known Allergies  Social History   Socioeconomic History   Marital status: Single    Spouse name: Not on file   Number of children: Not on file   Years of education: Not on file   Highest education level: Not on file  Occupational History   Not on file  Tobacco Use   Smoking status: Every Day    Packs/day: 1.00    Years: 16.00  Pack years: 16.00    Types: Cigarettes   Smokeless tobacco: Never  Vaping Use   Vaping Use: Some days   Substances: Nicotine  Substance and Sexual Activity   Alcohol use: Not Currently    Comment: Pt stated "2 years clean"   Drug use: Not Currently    Comment: Pt stated "It was opiates"   Sexual activity: Not on file  Other Topics Concern   Not on file  Social History Narrative   Not on file   Social Determinants of Health   Financial Resource Strain: Low Risk    Difficulty of Paying Living Expenses: Not very hard  Food Insecurity: No Food Insecurity   Worried About Running Out of  Food in the Last Year: Never true   Ran Out of Food in the Last Year: Never true  Transportation Needs: No Transportation Needs   Lack of Transportation (Medical): No   Lack of Transportation (Non-Medical): No  Physical Activity: Not on file  Stress: Not on file  Social Connections: Not on file  Intimate Partner Violence: Not on file   Family History  Problem Relation Age of Onset   Hypertension Mother    Hypertension Father    BP 118/74    Pulse 89    Wt (!) 157.4 kg (347 lb)    SpO2 97%    BMI 48.40 kg/m   Wt Readings from Last 3 Encounters:  06/02/21 (!) 157.4 kg (347 lb)  05/23/21 (!) 153.3 kg (338 lb)  05/04/21 (!) 140.6 kg (310 lb)   PHYSICAL EXAM: General:  NAD. No resp difficulty HEENT: Normal Neck: Supple. Thick neck. Carotids 2+ bilat; no bruits. No lymphadenopathy or thryomegaly appreciated. Cor: PMI nondisplaced. Regular rate & rhythm. No rubs, gallops or murmurs. Lungs: Clear Abdomen: Obese, nontender, nondistended. No hepatosplenomegaly. No bruits or masses. Good bowel sounds. Extremities: No cyanosis, clubbing, rash, 1+ BLE edema Neuro: Alert & oriented x 3, cranial nerves grossly intact. Moves all 4 extremities w/o difficulty. Affect pleasant.  ECG: SR, qrs 100 msec (personally reviewed).  ReDs: 38%  ASSESSMENT & PLAN: 1. Chronic systolic CHF:  - Low EF known since 9/21.  - Echo in 8/22 with EF 20-25%, severe LV dysfunction, Grade II DD, RV moderately reduced.   - Unclear etiology. No heavy ETOH or drugs, strong family hx. He does smoke and has history of HTN. Concern at one point for PVC-mediated CM, but only 4.3% on last monitor.   - Echo 12/22 w/ severely reduced LVEF <20% w/ global HK, no visible thrombus, + mild MR, RV normal.  - NYHA II, volume status looks elevated on exam, ReDs 38%. - Increase Lasix to 80 mg bid x 3 days + extra 20 KCL, then back to Lasix 80 mg daily + KCl 60 mEq daily. - Increase Entresto to 49/51 mg bid. - Continue digoxin 0.125  mg daily. - Continue dapagliflozin 10 daily.  - Continue spironolactone 25 daily.  - Fluid restrict.  - Arrange for [facility] to evaluate for CAD as cause of CM. If no CAD, will need cardiac MRI. We discussed this today and he is agreeable. - Labs today, BMET in 10-14 days.  2. HTN - Well controlled today. - Watch with increase in [place].  3. PVCs  - Zio 12/21 4.3% PVCs - probably not high enough burden to cause CM.  - Sleep study as above. - Check Mag today.  4. Suspect OSA  - Arrange for Home Sleep Study.   5. Tobacco  Use - Recommended cessation.  - Not ready to quit.  6. H/o non-compliance - Discharged from paramedicine due to failure to contact. - Discussed importance of taking medications regularly. - He has a pillbox  Follow up with APP after [facility] with Dr. Haroldine Laws.  Allena Katz, FNP-BC 06/02/21

## 2021-06-02 NOTE — Progress Notes (Signed)
ReDS Vest / Clip - 06/02/21 0900       ReDS Vest / Clip   Station Marker D    Ruler Value 45.5    ReDS Value Range Moderate volume overload    ReDS Actual Value 38

## 2021-06-02 NOTE — Patient Instructions (Addendum)
Thank you for coming in today  Labs were done today, if any labs are abnormal the clinic will call you  You were set up for a at home sleep study  INCREASE Entresto 49/51 mg 1 tablet daily   INCREASE Lasix to 80 mg twice daily for 3 days  INCREASE Potassium to 40 meq twice daily for 3 days THEN after the 3 days go back to Lasix 40 mg twice daily and potassium 60 meq daily   Your physician recommends that you schedule a follow-up appointment in: with clinic after heart cath  Your physician recommends that you return for lab work in: 10 -14 days  You are scheduled for Cardiac Catheterization on 06/12/2021 with Dr. Gala Romney  Please arrive at the [facility] at  0530 a.m. on the day of your procedure.  1. DIET ___ Nothing to eat or drink after midnight except your medications with a sip of water.    3. MAKE SURE YOU TAKE YOUR ASPIRIN.  4. ___ DO NOT TAKE these medications before your procedure: Lasix and Spirolactone     ___ YOU MAY TAKE ALL of your remaining medications with a small amount of water.   5. Plan for one night stay - bring personal belongings (i.e. toothpaste, toothbrush, etc.)  6. Bring a current list of your medications and current insurance cards.  7. Must have a responsible person to drive you home.  8. Someone must be with you for the first 24 hours after you arrive home.  9. Please wear clothes that are easy to get on and off and wear slip-on shoes.  * Special note: Every effort is made to have your procedure done on time. Occasionally there are emergencies that present themselves at the hospital that may cause delays. Please be patient if a delay does occur.  If you have any questions after you get home, please call the office at the number listed above.  At the [facility] Clinic, you and your health needs are our priority. As part of our continuing mission to provide you with exceptional heart care, we have  created designated Provider Care Teams. These Care Teams include your primary Cardiologist (physician) and Advanced Practice Providers (APPs- Physician Assistants and Nurse Practitioners) who all work together to provide you with the care you need, when you need it.   You may see any of the following providers on your designated Care Team at your next follow up: Dr Arvilla Meres Dr Carron Curie, NP Robbie Lis, [place] [facility], [place] Karle Plumber, PharmD   Please be sure to bring in all your medications bottles to every appointment.   If you have any questions or concerns before your next appointment please send [place] a message through [place] or call our office at [phone].    TO LEAVE A MESSAGE FOR THE NURSE SELECT OPTION 2, PLEASE LEAVE A MESSAGE INCLUDING: YOUR NAME DATE OF BIRTH CALL BACK NUMBER REASON FOR CALL**this is important as we prioritize the call backs  YOU WILL RECEIVE A CALL BACK THE SAME DAY AS LONG AS YOU CALL BEFORE 4:00 PM

## 2021-06-02 NOTE — Progress Notes (Signed)
Patient Name: Donald Murray        DOB: 12-21-1985      Height:     Weight: 347 lbs  Office Name: [facility]          Referring Provider:Jessica Milford NP  Today's Date:06/02/21  Date:   STOP BANG RISK ASSESSMENT S (snore) Have you been told that you snore?     YES   T (tired) Are you often tired, fatigued, or sleepy during the day?   YES  O (obstruction) Do you stop breathing, choke, or gasp during sleep? NO   P (pressure) Do you have or are you being treated for high blood pressure? YES   B (BMI) Is your body index greater than 35 kg/m? YES   A (age) Are you 70 years old or older? NO   N (neck) Do you have a neck circumference greater than 16 inches?   YES   G (gender) Are you a male? YES   TOTAL STOP/BANG YES ANSWERS                                                                        For Office Use Only              Procedure Order Form    YES to 3+ Stop Bang questions OR two clinical symptoms - patient qualifies for WatchPAT (CPT 95800)             Clinical Notes: Will consult Sleep Specialist and refer for management of therapy due to patient increased risk of Sleep Apnea. Ordering a sleep study due to the following two clinical symptoms: Excessive daytime sleepiness G47.10 / Gastroesophageal reflux K21.9 / Nocturia R35.1 / Morning Headaches G44.221 / Difficulty concentrating R41.840 / Memory problems or poor judgment G31.84 / Personality changes or irritability R45.4 / Loud snoring R06.83 / Depression F32.9 / Unrefreshed by sleep G47.8 / Impotence N52.9 / History of high blood pressure R03.0 / Insomnia G47.00    I understand that I am proceeding with a home sleep apnea test as ordered by my treating physician. I understand that untreated sleep apnea is a serious cardiovascular risk factor and it is my responsibility to perform the test and seek management for sleep apnea. I will be contacted with the results and be managed for sleep apnea by a local  sleep physician. I will be receiving equipment and further instructions from [facility]. I shall promptly ship back the equipment via the included mailing label. I understand my insurance will be billed for the test and as the patient I am responsible for any insurance related out-of-pocket costs incurred. I have been provided with written instructions and can call for additional video or telephonic instruction, with 24-hour availability of qualified personnel to answer any questions: Patient Help Desk [phone].  Patient Signature ______________________________________________________   Date______________________ Patient Telemedicine Verbal Consent

## 2021-06-02 NOTE — H&P (View-Only) (Signed)
° ° °ADVANCED HF CLINIC NOTE ° °Primary Care: Pcp, No °Primary Cardiologist: Brian Crenshaw, MD °HF Cardiologist: Dr. Bensimhon ° °HPI: °Donald Murray is a  35 y.o. male with HTN, IVDA (heroin), tobacco use, morbid obesity and systolic HF (onset 9/21). ° °Echo 10/20 EF 60-65%  ° °Admitted to [facility] 9/21 with new onset HF in setting of severe HTN 174/128.. ECG with sinus tach and frequent PVCs.  ° °Echo 02/16/20: EF 20-25% Moderate RV dysfunction.  ° °Seen by AHF in HF Consultation for the first time 04/24/20. 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 Lasix and Entresto increased. Digoxin added. Zio placed to quantify PVC burden - 4.3%. Zio 12/21 Sinus - average HR 104, 4.3% PVCs. ° °Clinic visit 12/21 doing well, Entresto was increased.  He no showed his follow up appointment in late February 2022.   ° °Seen in hospital 08/15/20 with medication and dietary non-adherence leading to fluid retention and he was having trouble sleeping, getting short of breath at night and having PND.  He was restarted on GDMT and given IV lasix 60mg x3 doses responded well and was discharged the following day.   ° °Admitted 8/22 with A/C CHF, diuresed with IV lasix. Repeat echo with EF 20-25%, RV moderately reduced. Exacerbation attributed to medication non-compliance and increase fluid intake. Discharge weight 327 lbs. ° °No showed his follow-up in HF clinic 09/22.  °  °Admitted 12/22 with A/C CHF exacerbation. Diuresed with IV lasix. AHF consulted. Echo showed severely reduced LVEF <20% w/ global HK, no visible thrombus, + mild MR, RV normal. R/LHC was recommended after diuresis to r/o CAD however pt requested for cath to be completed as outpatient. He was discharged home, weight 338 lbs.  ° °Today he returns for post hospital HF follow up. Overall feeling fine. 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 He is not SOB at work. Denies CP, dizziness, edema, or PND/Orthopnea. Appetite ok. No fever or chills. Not  weighing at home. Taking all medications. Smoking 1/2 ppd. No ETOH or drug use. ° °Past Medical History:  °Diagnosis Date  ° CHF (congestive heart failure) (HCC)   ° Hypertension   ° Obesity   ° Snoring   ° °Current Outpatient Medications  °Medication Sig Dispense Refill  ° dapagliflozin propanediol (FARXIGA) 10 MG TABS tablet Take 1 tablet (10 mg total) by mouth daily before breakfast. 30 tablet 11  ° digoxin (LANOXIN) 0.125 MG tablet Take 1 tablet (0.125 mg total) by mouth daily. 30 tablet 2  ° furosemide (LASIX) 40 MG tablet Take 1 tablet (40 mg total) by mouth 2 (two) times daily. 60 tablet 3  ° potassium chloride SA (KLOR-CON M) 20 MEQ tablet Take 3 tablets (60 mEq total) by mouth daily. 60 tablet 3  ° sacubitril-valsartan (ENTRESTO) 24-26 MG Take 1 tablet by mouth 2 (two) times daily. 180 tablet 3  ° spironolactone (ALDACTONE) 25 MG tablet Take 1 tablet (25 mg total) by mouth daily. 30 tablet 2  ° °No current facility-administered medications for this encounter.  ° °No Known Allergies ° °Social History  ° °Socioeconomic History  ° Marital status: Single  °  Spouse name: Not on file  ° Number of children: Not on file  ° Years of education: Not on file  ° Highest education level: Not on file  °Occupational History  ° Not on file  °Tobacco Use  ° Smoking status: Every Day  °  Packs/day: 1.00  °  Years: 16.00  °    Pack years: 16.00  °  Types: Cigarettes  ° Smokeless tobacco: Never  °Vaping Use  ° Vaping Use: Some days  ° Substances: Nicotine  °Substance and Sexual Activity  ° Alcohol use: Not Currently  °  Comment: Pt stated "2 years clean"  ° Drug use: Not Currently  °  Comment: Pt stated "It was opiates"  ° Sexual activity: Not on file  °Other Topics Concern  ° Not on file  °Social History Narrative  ° Not on file  ° °Social Determinants of Health  ° °Financial Resource Strain: Low Risk   ° Difficulty of Paying Living Expenses: Not very hard  °Food Insecurity: No Food Insecurity  ° Worried About Running Out of  Food in the Last Year: Never true  ° Ran Out of Food in the Last Year: Never true  °Transportation Needs: No Transportation Needs  ° Lack of Transportation (Medical): No  ° Lack of Transportation (Non-Medical): No  °Physical Activity: Not on file  °Stress: Not on file  °Social Connections: Not on file  °Intimate Partner Violence: Not on file  ° °Family History  °Problem Relation Age of Onset  ° Hypertension Mother   ° Hypertension Father   ° °BP 118/74    Pulse 89    Wt (!) 157.4 kg (347 lb)    SpO2 97%    BMI 48.40 kg/m²  ° °Wt Readings from Last 3 Encounters:  °06/02/21 (!) 157.4 kg (347 lb)  °05/23/21 (!) 153.3 kg (338 lb)  °05/04/21 (!) 140.6 kg (310 lb)  ° °PHYSICAL EXAM: °General:  NAD. No resp difficulty °HEENT: Normal °Neck: Supple. Thick neck. Carotids 2+ bilat; no bruits. No lymphadenopathy or thryomegaly appreciated. °Cor: PMI nondisplaced. Regular rate & rhythm. No rubs, gallops or murmurs. °Lungs: Clear °Abdomen: Obese, nontender, nondistended. No hepatosplenomegaly. No bruits or masses. Good bowel sounds. °Extremities: No cyanosis, clubbing, rash, 1+ BLE edema °Neuro: Alert & oriented x 3, cranial nerves grossly intact. Moves all 4 extremities w/o difficulty. Affect pleasant. ° °ECG: SR, qrs 100 msec (personally reviewed). ° °ReDs: 38% ° °ASSESSMENT & PLAN: °1. Chronic systolic CHF:  °- Low EF known since 9/21.  °- Echo in 8/22 with EF 20-25%, severe LV dysfunction, Grade II DD, RV moderately reduced.   °- Unclear etiology. No heavy ETOH or drugs, strong family hx. He does smoke and has history of HTN. Concern at one point for PVC-mediated CM, but only 4.3% on last monitor.   °- Echo 12/22 w/ severely reduced LVEF <20% w/ global HK, no visible thrombus, + mild MR, RV normal.  °- NYHA II, volume status looks elevated on exam, ReDs 38%. °- Increase Lasix to 80 mg bid x 3 days + extra 20 KCL, then back to Lasix 80 mg daily + KCl 60 mEq daily. °- Increase Entresto to 49/51 mg bid. °- Continue digoxin 0.125  mg daily. °- Continue dapagliflozin 10 daily.  °- Continue spironolactone 25 daily.  °- Fluid restrict.  °- Arrange for R/LHC to evaluate for CAD as cause of CM. If no CAD, will need cardiac MRI. We discussed this today and he is agreeable. °- Labs today, BMET in 10-14 days. ° °2. HTN °- Well controlled today. °- Watch with increase in Entresto. ° °3. PVCs  °- Zio 12/21 4.3% PVCs - probably not high enough burden to cause CM.  °- Sleep study as above. °- Check Mag today. ° °4. Suspect OSA  °- Arrange for Home Sleep Study.  ° °5. Tobacco   Use °- Recommended cessation.  °- Not ready to quit. ° °6. H/o non-compliance °- Discharged from paramedicine due to failure to contact. °- Discussed importance of taking medications regularly. °- He has a pillbox ° °Follow up with APP after R/LHC with Dr. Bensimhon. ° °Preslie Depasquale, FNP-BC °06/02/21 ° ° °

## 2021-06-02 NOTE — Progress Notes (Signed)
Patient given home sleep study device to complete ordered home sleep study.  I reviewed directions for use during his clinic visit today.  He is aware to await my call to complete study to assure that insurance precert is not necessary. °

## 2021-06-09 ENCOUNTER — Telehealth (HOSPITAL_COMMUNITY): Payer: Self-pay | Admitting: *Deleted

## 2021-06-09 NOTE — Telephone Encounter (Signed)
Sleep study auth request faxed to [facility] [id number]

## 2021-06-11 ENCOUNTER — Other Ambulatory Visit (HOSPITAL_COMMUNITY): Payer: Self-pay

## 2021-06-12 ENCOUNTER — Encounter (HOSPITAL_COMMUNITY): Payer: Self-pay

## 2021-06-12 ENCOUNTER — Encounter (HOSPITAL_COMMUNITY)
Admission: RE | Disposition: A | Discharge status: Home / Self Care | Payer: Self-pay | Source: Home / Self Care | Attending: Internal Medicine

## 2021-06-12 ENCOUNTER — Other Ambulatory Visit (HOSPITAL_COMMUNITY): Payer: Self-pay

## 2021-06-12 ENCOUNTER — Ambulatory Visit (HOSPITAL_COMMUNITY)
Admission: RE | Admit: 2021-06-12 | Discharge: 2021-06-12 | Disposition: A | Discharge status: Home / Self Care | Payer: BC Managed Care – PPO | Attending: Internal Medicine | Admitting: Internal Medicine

## 2021-06-12 ENCOUNTER — Other Ambulatory Visit: Payer: Self-pay

## 2021-06-12 DIAGNOSIS — F17210 Nicotine dependence, cigarettes, uncomplicated: Secondary | ICD-10-CM | POA: Diagnosis not present

## 2021-06-12 DIAGNOSIS — Z9114 Patient's other noncompliance with medication regimen: Secondary | ICD-10-CM | POA: Diagnosis not present

## 2021-06-12 DIAGNOSIS — Z6841 Body Mass Index (BMI) 40.0 and over, adult: Secondary | ICD-10-CM | POA: Insufficient documentation

## 2021-06-12 DIAGNOSIS — I428 Other cardiomyopathies: Secondary | ICD-10-CM | POA: Insufficient documentation

## 2021-06-12 DIAGNOSIS — I5022 Chronic systolic (congestive) heart failure: Secondary | ICD-10-CM | POA: Diagnosis not present

## 2021-06-12 DIAGNOSIS — I110 Hypertensive heart disease with heart failure: Secondary | ICD-10-CM | POA: Insufficient documentation

## 2021-06-12 DIAGNOSIS — I493 Ventricular premature depolarization: Secondary | ICD-10-CM | POA: Diagnosis not present

## 2021-06-12 DIAGNOSIS — Z79899 Other long term (current) drug therapy: Secondary | ICD-10-CM | POA: Diagnosis not present

## 2021-06-12 DIAGNOSIS — I272 Pulmonary hypertension, unspecified: Secondary | ICD-10-CM | POA: Insufficient documentation

## 2021-06-12 HISTORY — PX: RIGHT/LEFT HEART CATH AND CORONARY ANGIOGRAPHY: CATH118266

## 2021-06-12 LAB — POCT I-STAT 7, (LYTES, BLD GAS, ICA,H+H)
Acid-Base Excess: 1 mmol/L (ref 0.0–2.0)
Acid-base deficit: 2 mmol/L (ref 0.0–2.0)
Bicarbonate: 22.1 mmol/L (ref 20.0–28.0)
Bicarbonate: 25.7 mmol/L (ref 20.0–28.0)
Calcium, Ion: 1.1 mmol/L — ABNORMAL LOW (ref 1.15–1.40)
Calcium, Ion: 1.28 mmol/L (ref 1.15–1.40)
HCT: 40 % (ref 39.0–52.0)
HCT: 41 % (ref 39.0–52.0)
Hemoglobin: 13.6 g/dL (ref 13.0–17.0)
Hemoglobin: 13.9 g/dL (ref 13.0–17.0)
O2 Saturation: 54 %
O2 Saturation: 98 %
Potassium: 3.8 mmol/L (ref 3.5–5.1)
Potassium: 3.9 mmol/L (ref 3.5–5.1)
Sodium: 142 mmol/L (ref 135–145)
Sodium: 143 mmol/L (ref 135–145)
TCO2: 23 mmol/L (ref 22–32)
TCO2: 27 mmol/L (ref 22–32)
pCO2 arterial: 34.5 mmHg (ref 32.0–48.0)
pCO2 arterial: 42.3 mmHg (ref 32.0–48.0)
pH, Arterial: 7.391 (ref 7.350–7.450)
pH, Arterial: 7.414 (ref 7.350–7.450)
pO2, Arterial: 110 mmHg — ABNORMAL HIGH (ref 83.0–108.0)
pO2, Arterial: 29 mmHg — CL (ref 83.0–108.0)

## 2021-06-12 LAB — POCT I-STAT EG7
Acid-Base Excess: 0 mmol/L (ref 0.0–2.0)
Bicarbonate: 25.5 mmol/L (ref 20.0–28.0)
Calcium, Ion: 1.24 mmol/L (ref 1.15–1.40)
HCT: 42 % (ref 39.0–52.0)
Hemoglobin: 14.3 g/dL (ref 13.0–17.0)
O2 Saturation: 55 %
Potassium: 4.2 mmol/L (ref 3.5–5.1)
Sodium: 141 mmol/L (ref 135–145)
TCO2: 27 mmol/L (ref 22–32)
pCO2, Ven: 43.5 mmHg — ABNORMAL LOW (ref 44.0–60.0)
pH, Ven: 7.376 (ref 7.250–7.430)
pO2, Ven: 30 mmHg — CL (ref 32.0–45.0)

## 2021-06-12 SURGERY — RIGHT/LEFT HEART CATH AND CORONARY ANGIOGRAPHY
Anesthesia: LOCAL

## 2021-06-12 MED ORDER — LIDOCAINE HCL (PF) 1 % IJ SOLN
INTRAMUSCULAR | Status: DC | PRN
  Administered 2021-06-12: 4 mL

## 2021-06-12 MED ORDER — HEPARIN SODIUM (PORCINE) 1000 UNIT/ML IJ SOLN
INTRAMUSCULAR | Status: AC
  Filled 2021-06-12: qty 10

## 2021-06-12 MED ORDER — LIDOCAINE HCL (PF) 1 % IJ SOLN
INTRAMUSCULAR | Status: AC
  Filled 2021-06-12: qty 30

## 2021-06-12 MED ORDER — HYDRALAZINE HCL 20 MG/ML IJ SOLN: 10.0000 mg | INTRAMUSCULAR | Status: DC | PRN

## 2021-06-12 MED ORDER — SODIUM CHLORIDE 0.9% FLUSH: 3.0000 mL | Freq: Two times a day (BID) | INTRAVENOUS | Status: DC

## 2021-06-12 MED ORDER — VERAPAMIL HCL 2.5 MG/ML IV SOLN
INTRAVENOUS | Status: AC
  Filled 2021-06-12: qty 2

## 2021-06-12 MED ORDER — SODIUM CHLORIDE 0.9% FLUSH: 3.0000 mL | INTRAVENOUS | Status: DC | PRN

## 2021-06-12 MED ORDER — HEPARIN (PORCINE) IN NACL 1000-0.9 UT/500ML-% IV SOLN
INTRAVENOUS | Status: AC
  Filled 2021-06-12: qty 1000

## 2021-06-12 MED ORDER — HEPARIN (PORCINE) IN NACL 1000-0.9 UT/500ML-% IV SOLN
INTRAVENOUS | Status: DC | PRN
  Administered 2021-06-12 (×2): 500 mL

## 2021-06-12 MED ORDER — IOHEXOL 350 MG/ML SOLN
INTRAVENOUS | Status: DC | PRN
  Administered 2021-06-12: 40 mL

## 2021-06-12 MED ORDER — LABETALOL HCL 5 MG/ML IV SOLN: 10.0000 mg | INTRAVENOUS | Status: DC | PRN

## 2021-06-12 MED ORDER — VERAPAMIL HCL 2.5 MG/ML IV SOLN
INTRAVENOUS | Status: DC | PRN
  Administered 2021-06-12: 10 mL via INTRA_ARTERIAL

## 2021-06-12 MED ORDER — HEPARIN SODIUM (PORCINE) 1000 UNIT/ML IJ SOLN
INTRAMUSCULAR | Status: DC | PRN
  Administered 2021-06-12: 7000 [IU] via INTRAVENOUS

## 2021-06-12 MED ORDER — ACETAMINOPHEN 325 MG PO TABS: 650.0000 mg | ORAL_TABLET | ORAL | Status: DC | PRN

## 2021-06-12 MED ORDER — ONDANSETRON HCL 4 MG/2ML IJ SOLN: 4.0000 mg | Freq: Four times a day (QID) | INTRAMUSCULAR | Status: DC | PRN

## 2021-06-12 MED ORDER — ASPIRIN 81 MG PO CHEW
81.0000 mg | CHEWABLE_TABLET | ORAL | Status: AC
  Administered 2021-06-12: 81 mg via ORAL
  Filled 2021-06-12: qty 1

## 2021-06-12 MED ORDER — SODIUM CHLORIDE 0.9 % IV SOLN: INTRAVENOUS | Status: DC

## 2021-06-12 MED ORDER — FUROSEMIDE 10 MG/ML IJ SOLN
INTRAMUSCULAR | Status: AC
  Filled 2021-06-12: qty 8

## 2021-06-12 MED ORDER — SODIUM CHLORIDE 0.9 % IV SOLN: 250.0000 mL | INTRAVENOUS | Status: DC | PRN

## 2021-06-12 MED ORDER — FUROSEMIDE 10 MG/ML IJ SOLN
INTRAMUSCULAR | Status: DC | PRN
  Administered 2021-06-12: 80 mg via INTRAVENOUS

## 2021-06-12 SURGICAL SUPPLY — 11 items
CATH BALLN WEDGE 5F 110CM (CATHETERS) ×1 IMPLANT
CATH INFINITI 5 FR JL3.5 (CATHETERS) ×1 IMPLANT
CATH INFINITI JR4 5F (CATHETERS) ×1 IMPLANT
DEVICE RAD COMP TR BAND LRG (VASCULAR PRODUCTS) ×1 IMPLANT
GLIDESHEATH SLEND SS 6F .021 (SHEATH) ×1 IMPLANT
GUIDEWIRE .025 260CM (WIRE) ×1 IMPLANT
GUIDEWIRE INQWIRE 1.5J.035X260 (WIRE) IMPLANT
INQWIRE 1.5J .035X260CM (WIRE) ×2
PACK CARDIAC CATHETERIZATION (CUSTOM PROCEDURE TRAY) ×2 IMPLANT
SHEATH GLIDE SLENDER 4/5FR (SHEATH) ×1 IMPLANT
TRANSDUCER W/STOPCOCK (MISCELLANEOUS) ×2 IMPLANT

## 2021-06-12 NOTE — Interval H&P Note (Signed)
History and Physical Interval Note:  06/12/2021 7:42 AM  Donald Murray  has presented today for surgery, with the diagnosis of heart failure.  The various methods of treatment have been discussed with the patient and family. After consideration of risks, benefits and other options for treatment, the patient has consented to  Procedure(s): RIGHT/LEFT HEART CATH AND CORONARY ANGIOGRAPHY (N/A) and possible coronary angioplasty as a surgical intervention.  The patient's history has been reviewed, patient examined, no change in status, stable for surgery.  I have reviewed the patient's chart and labs.  Questions were answered to the patient's satisfaction.     [name]

## 2021-06-12 NOTE — Discharge Instructions (Signed)
Radial Site Care  This sheet gives you information about how to care for yourself after your procedure. Your health care provider may also give you more specific instructions. If you have problems or questions, contact your health care provider. What can I expect after the procedure? After the procedure, it is common to have: Bruising and tenderness at the catheter insertion area. Follow these instructions at home: Medicines Take over-the-counter and prescription medicines only as told by your health care provider. Insertion site care Follow instructions from your health care provider about how to take care of your insertion site. Make sure you: Wash your hands with soap and water before you remove your bandage (dressing). If soap and water are not available, use hand sanitizer. May remove dressing in 24 hours. Check your insertion site every day for signs of infection. Check for: Redness, swelling, or pain. Fluid or blood. Pus or a bad smell. Warmth. Do no take baths, swim, or use a hot tub for 5 days. You may shower 24-48 hours after the procedure. Remove the dressing and gently wash the site with plain soap and water. Pat the area dry with a clean towel. Do not rub the site. That could cause bleeding. Do not apply powder or lotion to the site. Activity  For 24 hours after the procedure, or as directed by your health care provider: Do not flex or bend the affected arm. Do not push or pull heavy objects with the affected arm. Do not drive yourself home from the hospital or clinic. You may drive 24 hours after the procedure. Do not operate machinery or power tools. KEEP ARM ELEVATED THE REMAINDER OF THE DAY. Do not push, pull or lift anything that is heavier than 10 lb for 5 days. Ask your health care provider when it is okay to: Return to work or school. Resume usual physical activities or sports. Resume sexual activity. General instructions If the catheter site starts to  bleed, raise your arm and put firm pressure on the site. If the bleeding does not stop, get help right away. This is a medical emergency. DRINK PLENTY OF FLUIDS FOR THE NEXT 2-3 DAYS. No alcohol consumption for 24 hours after receiving sedation. If you went home on the same day as your procedure, a responsible adult should be with you for the first 24 hours after you arrive home. Keep all follow-up visits as told by your health care provider. This is important. Contact a health care provider if: You have a fever. You have redness, swelling, or yellow drainage around your insertion site. Get help right away if: You have unusual pain at the radial site. The catheter insertion area swells very fast. The insertion area is bleeding, and the bleeding does not stop when you hold steady pressure on the area. Your arm or hand becomes pale, cool, tingly, or numb. These symptoms may represent a serious problem that is an emergency. Do not wait to see if the symptoms will go away. Get medical help right away. Call your local emergency services (911 in the [place] Do not drive yourself to the hospital. Summary After the procedure, it is common to have bruising and tenderness at the site. Follow instructions from your health care provider about how to take care of your radial site wound. Check the wound every day for signs of infection.  This information is not intended to replace advice given to you by your health care provider. Make sure you discuss any questions you have with   your health care provider. Document Revised: 06/16/2017 Document Reviewed: 06/16/2017 Elsevier Patient Education  2020 Elsevier Inc.  

## 2021-06-12 NOTE — Progress Notes (Unsigned)
Medication Samples have been provided to the patient.  Drug name: Sherryll Burger       Strength: 49/51 mg        Qty: 1  LOT: [id number]  Exp.Date: 11/2022   Dosing instructions: Take 1 tablet Twice daily   The patient has been instructed regarding the correct time, dose, and frequency of taking this medication, including desired effects and most common side effects.   Smitty Cords Giovanny Dugal 8:23 AM 06/12/2021

## 2021-06-12 NOTE — Progress Notes (Unsigned)
As per Dr. Gala Romney Lasix dose increased to 80 mg Twice daily. Sample of Entresto given to  Dr. Gala Romney

## 2021-06-13 ENCOUNTER — Encounter (HOSPITAL_COMMUNITY): Payer: Self-pay | Admitting: Internal Medicine

## 2021-06-16 ENCOUNTER — Other Ambulatory Visit (HOSPITAL_COMMUNITY): Payer: BC Managed Care – PPO

## 2021-06-17 ENCOUNTER — Other Ambulatory Visit (HOSPITAL_COMMUNITY): Payer: Self-pay

## 2021-06-18 ENCOUNTER — Other Ambulatory Visit: Payer: Self-pay

## 2021-06-18 ENCOUNTER — Ambulatory Visit (HOSPITAL_COMMUNITY)
Admit: 2021-06-18 | Discharge: 2021-06-18 | Disposition: A | Discharge status: Home / Self Care | Payer: BC Managed Care – PPO | Attending: Cardiology | Admitting: Cardiology

## 2021-06-18 VITALS — BP 98/75 | HR 100 | Temp 97.9°F | Ht 71.0 in | Wt 345.8 lb

## 2021-06-18 DIAGNOSIS — I5022 Chronic systolic (congestive) heart failure: Secondary | ICD-10-CM

## 2021-06-18 DIAGNOSIS — I5023 Acute on chronic systolic (congestive) heart failure: Secondary | ICD-10-CM | POA: Diagnosis not present

## 2021-06-18 DIAGNOSIS — Z6841 Body Mass Index (BMI) 40.0 and over, adult: Secondary | ICD-10-CM | POA: Diagnosis not present

## 2021-06-18 DIAGNOSIS — I10 Essential (primary) hypertension: Secondary | ICD-10-CM

## 2021-06-18 DIAGNOSIS — I493 Ventricular premature depolarization: Secondary | ICD-10-CM | POA: Diagnosis not present

## 2021-06-18 DIAGNOSIS — I110 Hypertensive heart disease with heart failure: Secondary | ICD-10-CM | POA: Diagnosis not present

## 2021-06-18 DIAGNOSIS — F1990 Other psychoactive substance use, unspecified, uncomplicated: Secondary | ICD-10-CM

## 2021-06-18 LAB — COMPREHENSIVE METABOLIC PANEL
ALT: 18 U/L (ref 0–44)
AST: 17 U/L (ref 15–41)
Albumin: 3.6 g/dL (ref 3.5–5.0)
Alkaline Phosphatase: 69 U/L (ref 38–126)
Anion gap: 10 (ref 5–15)
BUN: 14 mg/dL (ref 6–20)
CO2: 24 mmol/L (ref 22–32)
Calcium: 8.8 mg/dL — ABNORMAL LOW (ref 8.9–10.3)
Chloride: 105 mmol/L (ref 98–111)
Creatinine, Ser: 1.12 mg/dL (ref 0.61–1.24)
GFR, Estimated: >60 mL/min (ref >60)
Glucose, Bld: 99 mg/dL (ref 70–99)
Potassium: 3.8 mmol/L (ref 3.5–5.1)
Sodium: 139 mmol/L (ref 135–145)
Total Bilirubin: 0.8 mg/dL (ref 0.3–1.2)
Total Protein: 7.1 g/dL (ref 6.5–8.1)

## 2021-06-18 LAB — BRAIN NATRIURETIC PEPTIDE: B Natriuretic Peptide: 356.2 pg/mL — ABNORMAL HIGH (ref 0.0–100.0)

## 2021-06-18 LAB — DIGOXIN LEVEL: Digoxin Level: 0.5 ng/mL — ABNORMAL LOW (ref 0.8–2.0)

## 2021-06-18 NOTE — Progress Notes (Signed)
Patient in clinic for VAD appt.  He was inquiring about his ordered home sleep study. He tells me that he received a letter in the mail stating that the home sleep study was approved by his insurance.  Based on that information I have advised him to proceed with the study.

## 2021-06-18 NOTE — Progress Notes (Signed)
CSW met with patient in the VAD clinic today. Patient is being seen for initial discussions of potential VAD in the future. Patient shared that [place] up [place] over and has no family in the local area. He states that he has a substance use history but has been clean from drugs for 2 years and alcohol for the past 14 years. 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 Patient verbalizes understanding of potential plan for VAD and current plan of care. He will follow up with VAD Clinic and reach out to CSW as needed. [name], [place], [place]

## 2021-06-18 NOTE — Progress Notes (Signed)
Patient presents to [facility] Murray for initial vad evaluation visit by himself.   Dr. Haroldine Murray discussed heart cath results with patient with potential need for advanced heart failure therapy in the future. Ideal plan will be for patient to improve on medication, but if not, may need to consider LVAD. Current BMI of 48 is contraindicative for transplant at this time. Repeat echo scheduled next visit. VAD education started this visit.   Patient met with Donald Sarna, LCSW to discuss potential social barriers/concerns if VAD becomes an option.   Patient confirms he increased his Lasix to 80 mg twice daily. He has not been doing daily weights, but does have scale at home. He says he is drinking "a lot" because he "stays thirsty" but can't calculate how much he is drinking daily. He says he is "not adding" salt to his food, but due to work schedule, does "eat out a lot".  Patient says he is feeling "better" and had the "best day" yesterday that he has had in a while.   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 He also woks 8 hrs PRN at [employer] in [place]. Says both jobs are not very physical and he denies issues with either.   Patient waiting medication assistance for Entresto. He received samples last visit. [name], CPhT updated. Samples bottles x 2 of Entresto 49-51 mg; SN: [id number]; exp 11/24. Pt says he has been getting meds through [facility]. He now has [employer] through work so he will need to start getting medications with his insurance. Asked him to call VAD Murray if any issues in obtained medications.   Patient received letter saying his sleep study has been approved by his insurance. Donald Binning, RN in room and told him to go ahead an perform sleep study and send in after completed. Patient verbalized agreement to same.   Patient has not had routine dental care, but now has dental insurance. Encouraged dental check in near future if possible (if VAD becomes an  option in future).   Symptom Yes No Details  Angina        x Activity:  Claudication        x How far:  Syncope        x When:  Stroke        x   Orthopnea        x  How many pillows:  4  PND        x  How often:  "often" prior to admission;  none since hospital discharge  CPAP        x How many hrs:  Pedal edema        x   Abd fullness        x   N&V        x Reports can eat a full meal  Diaphoresis        x  When:  chronic; "hot natured"  SOB        x  Activity:  flight of stairs  Palpitations               x When:  ICD shock   N/A  Hospitlizaitons       x  When/where/why:   ED visit       x  When/where/why:  Other MD       x  When/who/why:  Activity   Working full time  Fluid   >  2 Liters/daily  Diet   No added salt    Vital Signs:  Sitting   Standing Temp: 97.9 Automatic BP:  98/75 (82)  98/62 (78) HR:  100 SR    105 SPO2:  95% on RA  Weight: 345.8 lbs Last weight:  347 lbs Home weights: has not been doing  Device:   N/A  VAD education initiated with patient per Dr. Haroldine Murray.  VAD educational packet including "Understanding Your Options with Advanced Heart Failure", "[place] Patient Agreement for VAD Evaluation and Potential Implantation" and [employer] "Heartmate 3 Left Ventricular Device (LVAD) Patient Guide", "[place] HM III Patient Education", "[place] Mechanical Circulatory Support Program", and "Decision Aids for Left Ventricular Assist Device" given to patient reference.     Explained need for 24/7 care when pt is discharged home due to sternal precautions, adaptation to living on support, emotional support, consistent and meticulous exit site care and management, medication adherence and high volume of follow up visits with the Rogers Murray after discharge; pt verbalized understanding of above.   Discussed average hospital stay of 2 weeks with 6 weeks of sternal precautions including no driving, no lifting, pushing, pulling, etc.   Explained need to be  on blood thinner as long as he has pump which will require labs every week or every other week to monitor levels of warfarin.  Reinforced need for 24 hour/7 day week caregivers; will also need to abide by sternal precautions with no lifting >10lbs, pushing, pulling and will need assistance with adapting to new life style with VAD equipment and care.   Reviewed pictures of VAD drive line, site care, dressing changes, and drive line stabilization including securement attachment device and abdominal binder. Discussed with pt that he will be required to purchase dressing supplies as long as patient has the VAD in place.   Provided brief equipment overview and demonstration with HeartMate III including discussion on the following:   a) MPU b) system controller   c) universal Charity fundraiser   d) battery clips   e) Batteries   f)  Perc lock   g) Percutaneous lead   The patient at this time cannot identify a caregiver, but will think about it. Family lives in different states and they care for elderly family members. Sister lives in [place], but has newborn baby. He lives with roommates, but they all work and have busy lives. He works to [employer] and pay bills, does not have secondary income or savings at this time. He would need @ 12 weeks FMLA for surgery and recovery if all goes well. Discussed with Donald Sarna, LCSW.                            The patient understands that from this discussion it does not mean that they will receive the device, but that depends on an extensive evaluation process. Will not begin evaluation at this time. Patient will have repeat echo, possible CPX in future, and continue medication for now. If he remains stable will transition him to Heart Failure Murray per Dr. Haroldine Murray.   Patient Instructions: No change in medications.  Try to get dental check up. Return in 3 weeks for echo and VAD appt with Dr. Haroldine Murray. Call if any issues with medications or heart  failure symptoms. VAD Murray contact information provided.    Donald Girt, RN VAD Coordinator   Office: [phone] 24/7 VAD Pager: [phone]

## 2021-06-18 NOTE — Patient Instructions (Addendum)
No change in medications.  Try to get dental check up. Return in 3 weeks for echo and VAD appt with Dr. Gala Romney. Call if any issues with medications or heart failure symptoms. VAD Clinic contact information provided.

## 2021-06-19 ENCOUNTER — Inpatient Hospital Stay (INDEPENDENT_AMBULATORY_CARE_PROVIDER_SITE_OTHER): Payer: Self-pay | Admitting: Primary Care

## 2021-06-20 ENCOUNTER — Other Ambulatory Visit (HOSPITAL_COMMUNITY): Payer: Self-pay | Admitting: *Deleted

## 2021-06-20 ENCOUNTER — Other Ambulatory Visit (HOSPITAL_COMMUNITY): Payer: Self-pay

## 2021-06-20 ENCOUNTER — Telehealth (HOSPITAL_COMMUNITY): Payer: Self-pay | Admitting: Pharmacy Technician

## 2021-06-20 MED ORDER — DAPAGLIFLOZIN PROPANEDIOL 10 MG PO TABS
10.0000 mg | ORAL_TABLET | Freq: Every day | ORAL | 3 refills | Status: DC
  Filled 2021-06-20 – 2021-11-03 (×2): qty 90, 90d supply, fill #0
  Filled 2022-03-10: qty 90, 90d supply, fill #1
  Filled 2022-03-10: qty 30, 30d supply, fill #1

## 2021-06-20 NOTE — Telephone Encounter (Signed)
Patient Advocate Encounter   Received notification from [employer] that prior authorization for Donald Murray is required.   PA submitted on CoverMyMeds Key [id number] Status is pending   Will continue to follow.  The patient currently has [place] assistance for Donald Murray that is set to expire on 09/01/21. Since he has Nurse, learning disability will check the copay and make sure we can apply the co-pay card to the prescription. The patient's current 90 day co-pay for Entresto is $150.  BIN [id number] PCN OHCP ID [id number] Group [id number]

## 2021-06-20 NOTE — Telephone Encounter (Signed)
Advanced Heart Failure Patient Advocate Encounter  Prior Authorization for Marcelline Deist has been approved.    PA# [id number] Effective dates: 06/20/21 through 06/20/22  Patients co-pay is $25 (90 days)  BIN [id number] PCN CN ID [id number] Group [id number]  Called and spoke with the patient. He is aware that we will not seek assistance at this time. Sent 90 day RX request to [facility] ([facility] to send to [facility] outpatient. Will put the RXs on hold until the patient calls in and requests a refill.  Archer Asa, CPhT

## 2021-06-22 NOTE — Progress Notes (Signed)
ADVANCED HF CLINIC NOTE  Primary Care: Pcp, No Primary Cardiologist: Olga Millers, MD  HPI:  Donald Murray is a morbidly obese 36 y.o. male with HTN, previous IVDA (heroin), tobacco use and systolic HF (onset 9/21) referred by Dr. Jens Som for further management of his HF.   Echo 10/20 EF 60-65%   Admitted to [facility] 9/21 with new onset HF in setting of severe HTN 174/128.Marland Kitchen ECG with sinus tach and frequent PVCs.   Echo 02/16/20: EF 20-25% Moderate RV dysfunction.   I saw him in HF Consultation for the first time 04/24/20. 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 Lasix and Entresto increased. Digoxin added. Zio placed to quantify PVC burden - 4.3%.  Admitted 12/22 with A/C CHF exacerbation. Echo LVEF <20% w/ global HK, no visible thrombus, + mild MR, RV normal. R/LHC was recommended after diuresis to r/o CAD however pt requested for cath to be completed as outpatient.  Underwent cath 06/12/21 with normal cors. EF < 10% Elevated filling pressures (PCWP 28) and low output (CI 1.5). Lasix increased to 80 bid. Says he feels much better. Better than he has felt in a long time. 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 Takes his time. Gets SOB if he goes to fast. Complaint with meds. No CP, orthopnea or PND. No substance abuse   RHC 1/23 Ao = 91/72 (81) LV = 108/37 RA = 9 RV = 51/15 PA = 66/38 (50) PCW = 28 Fick cardiac output/index = 3.9/1.5 PVR = 5.7 SVR = 1,484 Ao sat = 98% PA sat = 55%, 54% PAPI = 3.1  Zio 12/21 Sinus - average HR 104 4.3% PVCs    Past Medical History:  Diagnosis Date   CHF (congestive heart failure) (HCC)    Hypertension    Obesity    Snoring     Current Outpatient Medications  Medication Sig Dispense Refill   dapagliflozin propanediol (FARXIGA) 10 MG TABS tablet Take 1 tablet (10 mg total) by mouth daily before breakfast. 90 tablet 3   digoxin (LANOXIN) 0.125 MG tablet Take 1 tablet (0.125 mg total) by mouth daily. 30 tablet 2    furosemide (LASIX) 80 MG tablet Take 80 mg by mouth 2 (two) times daily.     potassium chloride SA (KLOR-CON M) 20 MEQ tablet Take 3 tablets (60 mEq total) by mouth daily. 60 tablet 3   sacubitril-valsartan (ENTRESTO) 49-51 MG Take 1 tablet by mouth 2 (two) times daily. 60 tablet 4   spironolactone (ALDACTONE) 25 MG tablet Take 1 tablet (25 mg total) by mouth daily. 30 tablet 2   No current facility-administered medications for this encounter.    No Known Allergies    Social History   Socioeconomic History   Marital status: Single    Spouse name: Not on file   Number of children: Not on file   Years of education: Not on file   Highest education level: Not on file  Occupational History   Not on file  Tobacco Use   Smoking status: Every Day    Packs/day: 1.00    Years: 16.00    Pack years: 16.00    Types: Cigarettes   Smokeless tobacco: Never  Vaping Use   Vaping Use: Some days   Substances: Nicotine  Substance and Sexual Activity   Alcohol use: Not Currently    Comment: Pt stated "2 years clean"   Drug use: Not Currently    Comment: Pt stated "It was opiates"   Sexual  activity: Not on file  Other Topics Concern   Not on file  Social History Narrative   Not on file   Social Determinants of Health   Financial Resource Strain: Low Risk    Difficulty of Paying Living Expenses: Not very hard  Food Insecurity: No Food Insecurity   Worried About Running Out of Food in the Last Year: Never true   Ran Out of Food in the Last Year: Never true  Transportation Needs: No Transportation Needs   Lack of Transportation (Medical): No   Lack of Transportation (Non-Medical): No  Physical Activity: Not on file  Stress: Not on file  Social Connections: Not on file  Intimate Partner Violence: Not on file      Family History  Problem Relation Age of Onset   Hypertension Mother    Hypertension Father     Vitals:   06/18/21 1432  BP: 98/75  Pulse: 100  Temp: 97.9 F (36.6  C)  TempSrc: Oral  SpO2: 95%  Weight: (!) 156.9 kg (345 lb 12.8 oz)  Height: 5\' 11"  (1.803 m)    PHYSICAL EXAM: General:  Obese male. Well appearing. No resp difficulty HEENT: normal Neck: supple. no JVD. Carotids 2+ bilat; no bruits. No lymphadenopathy or thryomegaly appreciated. Cor: PMI nondisplaced. Regular tachy  No rubs, gallops or murmurs. Lungs: clear Abdomen: obese soft, nontender, nondistended. No hepatosplenomegaly. No bruits or masses. Good bowel sounds. Extremities: no cyanosis, clubbing, rash, edema Neuro: alert & orientedx3, cranial nerves grossly intact. moves all 4 extremities w/o difficulty. Affect pleasant   ASSESSMENT & PLAN:  1. Chronic systolic HF - diagnosed 9/21 Echo EF 20-25% RV moderately HK - suspect HTN vs PVC-mediated (PVC burden 4.3% - probably not high enough to cause CM) - cath 1/23 no CAD EF < 10% - NYHA II- III - Volume status improved. Continue lasi 80 bid - Continue Farxiga 10 - Continue Entresto 49/51 - Continue digoxin 0.125 - Continue spiro 12.5 - Off carvedilol due to low output - Long talk about possible need for advanced therapies. Now doing better from clinical standpoint. Will repeat echo and continue to follow closely. Low threshold to proceed with CPX - Not transplant candidate due to size but would be VAD candidate likely  2. HTN, severe - Blood pressure now on low side  3. Frequent PVCs - Zio 12/21 4.3% PVCs - probably not high enough burden to cause CM  - needs sleep study - does not have insurance to cover  4. IVDA (heroin) - reports no use since 12/20  5. Morbid obesity - needs weight loss and sleep study - consider GLP1RA  Seen by VAD team and SW as well.   Total time personally spent 45 minutes. Over half that time spent discussing above.    1/21, MD  2:29 PM

## 2021-06-27 ENCOUNTER — Telehealth (HOSPITAL_COMMUNITY): Payer: Self-pay | Admitting: Surgery

## 2021-06-27 NOTE — Telephone Encounter (Signed)
Patient called and I left a message to remind him to complete ordered home sleep study.

## 2021-06-29 ENCOUNTER — Encounter (HOSPITAL_BASED_OUTPATIENT_CLINIC_OR_DEPARTMENT_OTHER): Payer: BC Managed Care – PPO | Admitting: Cardiology

## 2021-06-29 DIAGNOSIS — G4733 Obstructive sleep apnea (adult) (pediatric): Secondary | ICD-10-CM | POA: Diagnosis not present

## 2021-06-29 NOTE — Procedures (Addendum)
° ° °  Sleep Study Report Patient Information Study Date: 06/29/21 Patient Name: Donald Murray Patient ID: [id number] Birth Date: 11-03-1985 Age: 36 Gender: Male Referring Physician: Arvilla Meres, MD  TEST DESCRIPTION: Home sleep apnea testing was completed using the WatchPat, a Type 1 device, utilizing peripheral arterial tonometry (PAT), chest movement, actigraphy, pulse oximetry, pulse rate, body position and snore. AHI was calculated with apnea and hypopnea using valid sleep time as the denominator. RDI includes apneas, hypopneas, and RERAs. The data acquired and the scoring of sleep and all associated events were performed in accordance with the recommended standards and specifications as outlined in the AASM Manual for the Scoring of Sleep and Associated Events 2.2.0 (2015).  FINDINGS: 1. Severe Obstructive Sleep Apnea with AHI 72.1/hr. 2. Moderate Central Sleep Apnea with pAHIc 23.7/hr with 39.6% Cheyne Stokes Respirations. 3. Oxygen desaturations as low as 84%. 4. Moderate snoring was present. O2 sats were < 88% for 2.7 min. 5. Total sleep time was 4 hrs and 2 min. 6. No REM sleep was recorded. 7. Shortened sleep onset latency at 8 min. 8. No REM sleep was present. 9. Total awakenings were 29.  DIAGNOSIS: Severe Obstructive Sleep Apnea (G47.33) Moderate Cental Sleep Apnea  RECOMMENDATIONS: 1. Clinical correlation of these findings is necessary. The decision to treat obstructive sleep apnea (OSA) is usually based on the presence of apnea symptoms or the presence of associated medical conditions such as Hypertension, Congestive Heart Failure, Atrial Fibrillation or Obesity. The most common symptoms of OSA are snoring, gasping for breath while sleeping, daytime sleepiness and fatigue.  2. Initiating apnea therapy is recommended given the presence of symptoms and/or associated conditions. Recommend proceeding with one of the following:   a. Auto-CPAP therapy with a  pressure range of 5-20cm H2O.  Patient may require BiPAP given severity of respiratory events.    b. An oral appliance (OA) that can be obtained from certain dentists with expertise in sleep medicine. These are primarily of use in non-obese patients with mild and moderate disease.   c. An ENT consultation which may be useful to look for specific causes of obstruction and possible treatment options.   d. If patient is intolerant to PAP therapy, consider referral to ENT for evaluation for hypoglossal nerve stimulator.  3. Close follow-up is necessary to ensure success with CPAP or oral appliance therapy for maximum benefit .  4. A follow-up oximetry study on CPAP is recommended to assess the adequacy of therapy and determine the need for supplemental oxygen or the potential need for Bi-level therapy. An arterial blood gas to determine the adequacy of baseline ventilation and oxygenation should also be considered.  5. Healthy sleep recommendations include: adequate nightly sleep (normal 7-9 hrs/night), avoidance of caffeine after noon and alcohol near bedtime, and maintaining a sleep environment that is cool, dark and quiet.  6. Weight loss for overweight patients is recommended. Even modest amounts of weight loss can significantly improve the severity of sleep apnea.  7. Snoring recommendations include: weight loss where appropriate, side sleeping, and avoidance of alcohol before bed.  8. Operation of motor vehicle should be avoided when sleepy.  Signature: Electronically Signed: 06/29/21 Armanda Magic, MD; [facility]; Diplomat, American Board of Sleep Medicine

## 2021-06-30 ENCOUNTER — Other Ambulatory Visit: Payer: Self-pay

## 2021-06-30 ENCOUNTER — Other Ambulatory Visit (HOSPITAL_COMMUNITY): Payer: Self-pay | Admitting: Family Medicine

## 2021-06-30 ENCOUNTER — Encounter (HOSPITAL_COMMUNITY): Payer: Self-pay

## 2021-06-30 ENCOUNTER — Ambulatory Visit (HOSPITAL_COMMUNITY)
Admission: RE | Admit: 2021-06-30 | Discharge: 2021-06-30 | Disposition: A | Discharge status: Home / Self Care | Payer: BC Managed Care – PPO | Source: Ambulatory Visit | Attending: Internal Medicine | Admitting: Internal Medicine

## 2021-06-30 ENCOUNTER — Other Ambulatory Visit (HOSPITAL_COMMUNITY): Payer: Self-pay | Admitting: Unknown Physician Specialty

## 2021-06-30 ENCOUNTER — Other Ambulatory Visit (HOSPITAL_COMMUNITY): Payer: Self-pay

## 2021-06-30 ENCOUNTER — Encounter (HOSPITAL_COMMUNITY): Payer: Self-pay | Admitting: *Deleted

## 2021-06-30 VITALS — BP 107/82 | HR 116 | Temp 98.3°F | Ht 71.0 in | Wt 374.4 lb

## 2021-06-30 DIAGNOSIS — I5023 Acute on chronic systolic (congestive) heart failure: Secondary | ICD-10-CM

## 2021-06-30 DIAGNOSIS — G4733 Obstructive sleep apnea (adult) (pediatric): Secondary | ICD-10-CM

## 2021-06-30 DIAGNOSIS — I5043 Acute on chronic combined systolic (congestive) and diastolic (congestive) heart failure: Secondary | ICD-10-CM | POA: Diagnosis not present

## 2021-06-30 DIAGNOSIS — I493 Ventricular premature depolarization: Secondary | ICD-10-CM | POA: Diagnosis not present

## 2021-06-30 DIAGNOSIS — I5022 Chronic systolic (congestive) heart failure: Secondary | ICD-10-CM

## 2021-06-30 LAB — CBC
HCT: 46.3 % (ref 39.0–52.0)
Hemoglobin: 15 g/dL (ref 13.0–17.0)
MCH: 26.7 pg (ref 26.0–34.0)
MCHC: 32.4 g/dL (ref 30.0–36.0)
MCV: 82.4 fL (ref 80.0–100.0)
Platelets: 317 10*3/uL (ref 150–400)
RBC: 5.62 MIL/uL (ref 4.22–5.81)
RDW: 16.3 % — ABNORMAL HIGH (ref 11.5–15.5)
WBC: 10.2 10*3/uL (ref 4.0–10.5)
nRBC: 0 % (ref 0.0–0.2)

## 2021-06-30 LAB — BASIC METABOLIC PANEL
Anion gap: 12 (ref 5–15)
BUN: 17 mg/dL (ref 6–20)
CO2: 23 mmol/L (ref 22–32)
Calcium: 9 mg/dL (ref 8.9–10.3)
Chloride: 104 mmol/L (ref 98–111)
Creatinine, Ser: 1.11 mg/dL (ref 0.61–1.24)
GFR, Estimated: >60 mL/min (ref >60)
Glucose, Bld: 97 mg/dL (ref 70–99)
Potassium: 3.9 mmol/L (ref 3.5–5.1)
Sodium: 139 mmol/L (ref 135–145)

## 2021-06-30 LAB — BRAIN NATRIURETIC PEPTIDE: B Natriuretic Peptide: 804 pg/mL — ABNORMAL HIGH (ref 0.0–100.0)

## 2021-06-30 MED ORDER — METOLAZONE 2.5 MG PO TABS
2.5000 mg | ORAL_TABLET | ORAL | 3 refills | Status: DC | PRN
  Filled 2021-06-30: qty 30, 30d supply, fill #0

## 2021-06-30 MED ORDER — FUROSEMIDE 10 MG/ML IJ SOLN
80.0000 mg | Freq: Once | INTRAMUSCULAR | Status: AC
  Administered 2021-06-30: 80 mg via INTRAVENOUS

## 2021-06-30 MED ORDER — POTASSIUM CHLORIDE 20 MEQ PO PACK: 40.0000 meq | PACK | Freq: Once | ORAL | Status: DC

## 2021-06-30 MED ORDER — TORSEMIDE 20 MG PO TABS
40.0000 mg | ORAL_TABLET | Freq: Two times a day (BID) | ORAL | 3 refills | Status: DC
  Filled 2021-06-30: qty 180, fill #0
  Filled 2021-06-30: qty 360, 90d supply, fill #0
  Filled 2021-09-23: qty 360, 90d supply, fill #1
  Filled 2021-12-31 – 2022-03-10 (×3): qty 360, 90d supply, fill #2

## 2021-06-30 MED ORDER — METOLAZONE 2.5 MG PO TABS
2.5000 mg | ORAL_TABLET | Freq: Once | ORAL | Status: AC
  Administered 2021-06-30: 2.5 mg via ORAL

## 2021-06-30 MED ORDER — POTASSIUM CHLORIDE CRYS ER 20 MEQ PO TBCR
EXTENDED_RELEASE_TABLET | ORAL | 3 refills | Status: DC
  Filled 2021-06-30: qty 240, 48d supply, fill #0

## 2021-06-30 MED ORDER — POTASSIUM CHLORIDE CRYS ER 20 MEQ PO TBCR
40.0000 meq | EXTENDED_RELEASE_TABLET | Freq: Once | ORAL | Status: AC
  Administered 2021-06-30: 40 meq via ORAL

## 2021-06-30 NOTE — Patient Instructions (Signed)
Stop lasix and start Torsemide 40 mg twice daily. Take Metolazone 2.5 mg tomorrow to boost urine output. May take one tablet as needed on days you feel your urine output is down. Take extra potassium 40 meq (2 tabs) on days you take Metolazone. Return to clinic Friday. If not improved, be prepared for hospital admission.

## 2021-06-30 NOTE — Progress Notes (Signed)
Patient presents to VAD Clinic for sick visit by himself. Patient called VAD office earlier today with c/o increased PND for last few nights.   Patient says Friday/Saturday night he started waking up multiple times during night with feelings of being unable to breathe. He says it started the night he performed home sleep study. Last night he had to go and sit up on corner of couch in order to sleep. He also notes his urine output has decreased over last few days after taking Lasix 80 mg twice daily.   He denies any weight gain, peripheral edema, or abdominal swelling. Dr. Gala Romney in and assessed patient. He advised patient be admitted today for IV lasix and start VAD evaluation. Pt says he has to be at work over next two days for busy schedule. Lasix 80 mg IV + Metolazone 2.5 mg + 40 meq potassium given in clinic today via # 20 angiocath left hand (see MAR). Patient voided 1 liter clear yellow urine over next hour. IV dc'd prior to patient leaving clinic.   Patient reports fluid intake @ 4 liters/day; "I stay thirsty" and does not follow 2000 mg sodium/day diet. Explained importance of 2 liters fluid and 2 gm NA daily intake to help with heart failure symptoms. Pt verbalized understanding of same.   Dr. Gala Romney instructed patient to switch from Lasix to torsemide 40 mg bid, take metolazone 2.5 mg tomorrow, along with extra 40 meq potassium. He may also take metolazone 2.5 mg once daily as needed if urine output does not respond to torsemide. Pt is to call VAD clinic if symptoms worsen or do not improve; or come to ED. Pt verbalized agreement to same.   Patient has not read literature sent home about VAD. He has not found caregiver. Discussed with Dr. Gala Romney, he will speak with his sister who lives in [place] about possibility of helping him if VAD surgery needed.   New Rxs sent electronically to [facility]. Patient says he has not received his new medical insurance card.     Vital  Signs:   Temp:  98.3 Automatic BP:  107/82 (92)   HR:  116 SR on cardiac monitor   SPO2:  97% on RA  Weight: 347.4 lbs Last weight:  345.8 lbs Home weights: has not been doing  Device:   N/A   Patient Instructions: Stop lasix and start Torsemide 40 mg twice daily. Take Metolazone 2.5 mg tomorrow to boost urine output. May take one tablet as needed per day on days you feel your urine output is down. Take extra potassium 40 meq (2 tabs) on days you take Metolazone. Return to clinic Friday. If not improved, be prepared for hospital admission.    Hessie Diener, RN VAD Coordinator   Office: [phone] 24/7 VAD Pager: [phone]

## 2021-07-01 ENCOUNTER — Ambulatory Visit: Payer: BC Managed Care – PPO

## 2021-07-01 ENCOUNTER — Other Ambulatory Visit: Payer: Self-pay

## 2021-07-01 DIAGNOSIS — G4733 Obstructive sleep apnea (adult) (pediatric): Secondary | ICD-10-CM

## 2021-07-04 ENCOUNTER — Telehealth (HOSPITAL_COMMUNITY): Payer: Self-pay | Admitting: Unknown Physician Specialty

## 2021-07-04 ENCOUNTER — Encounter (HOSPITAL_COMMUNITY): Payer: BC Managed Care – PPO

## 2021-07-04 NOTE — Telephone Encounter (Signed)
Pt called the clinic yesterday stating that since starting the Torsemide he no longer has PND and his SOB is much better. Pt wanted to cancel his appt for 2/10 since he is making good urine and his symptoms are much better. Pt states that he needs to work on 2/10 pt assures [place] that he will be at his appt on 2/15 for echo and to see Dr Gala Romney. Dr Gala Romney updated with above.  Carlton Adam RN, BSN VAD Coordinator 24/7 Pager [phone]

## 2021-07-05 NOTE — Progress Notes (Signed)
ADVANCED HF CLINIC NOTE  Primary Care: Pcp, No Primary Cardiologist: Olga Millers, MD  HPI:  Donald Murray is a morbidly obese 36 y.o. male with HTN, previous IVDA (heroin), tobacco use and systolic HF (onset 9/21) referred by Dr. Jens Som for further management of his HF.   Echo 10/20 EF 60-65%   Admitted to [facility] 9/21 with new onset HF in setting of severe HTN 174/128.Marland Kitchen ECG with sinus tach and frequent PVCs.   Echo 02/16/20: EF 20-25% Moderate RV dysfunction.   I saw him in HF Consultation for the first time 04/24/20. 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 Lasix and Entresto increased. Digoxin added. Zio placed to quantify PVC burden - 4.3%.  Admitted 12/22 with A/C CHF exacerbation. Echo LVEF <20% w/ global HK, no visible thrombus, + mild MR, RV normal. R/LHC was recommended after diuresis to r/o CAD however pt requested for cath to be completed as outpatient.  Underwent cath 06/12/21 with normal cors. EF < 10% Elevated filling pressures (PCWP 28) and low output (CI 1.5). Lasix increased to 80 bid.  Presents to clinic for unscheduled visit due to recurrent orthopnea and PND. Still working but has to take frequent breaks. + LE edema. Compliant with meds but lasix not working well.   RHC 1/23 Ao = 91/72 (81) LV = 108/37 RA = 9 RV = 51/15 PA = 66/38 (50) PCW = 28 Fick cardiac output/index = 3.9/1.5 PVR = 5.7 SVR = 1,484 Ao sat = 98% PA sat = 55%, 54% PAPI = 3.1  Zio 12/21 Sinus - average HR 104 4.3% PVCs    Past Medical History:  Diagnosis Date   CHF (congestive heart failure) (HCC)    Hypertension    Obesity    Snoring     Current Outpatient Medications  Medication Sig Dispense Refill   dapagliflozin propanediol (FARXIGA) 10 MG TABS tablet Take 1 tablet (10 mg total) by mouth daily before breakfast. 90 tablet 3   digoxin (LANOXIN) 0.125 MG tablet Take 1 tablet (0.125 mg total) by mouth daily. 30 tablet 2   metolazone (ZAROXOLYN) 2.5 MG tablet Take 1 tablet  (2.5 mg total) by mouth as needed. 30 tablet 3   sacubitril-valsartan (ENTRESTO) 49-51 MG Take 1 tablet by mouth 2 (two) times daily. 60 tablet 4   spironolactone (ALDACTONE) 25 MG tablet Take 1 tablet (25 mg total) by mouth daily. 30 tablet 2   torsemide (DEMADEX) 20 MG tablet Take 2 tablets (40 mg total) by mouth 2 (two) times daily. 360 tablet 3   potassium chloride SA (KLOR-CON M) 20 MEQ tablet Take 3 tablets (60 mEq total) by mouth daily. Take an additional 2 tablets (40 mEq) on days that you take metolazone. 240 tablet 3   No current facility-administered medications for this encounter.    No Known Allergies    Social History   Socioeconomic History   Marital status: Single    Spouse name: Not on file   Number of children: Not on file   Years of education: Not on file   Highest education level: Not on file  Occupational History   Not on file  Tobacco Use   Smoking status: Every Day    Packs/day: 1.00    Years: 16.00    Pack years: 16.00    Types: Cigarettes   Smokeless tobacco: Never  Vaping Use   Vaping Use: Some days   Substances: Nicotine  Substance and Sexual Activity   Alcohol use: Not Currently  Comment: Pt stated "2 years clean"   Drug use: Not Currently    Comment: Pt stated "It was opiates"   Sexual activity: Not on file  Other Topics Concern   Not on file  Social History Narrative   Not on file   Social Determinants of Health   Financial Resource Strain: Low Risk    Difficulty of Paying Living Expenses: Not very hard  Food Insecurity: No Food Insecurity   Worried About Running Out of Food in the Last Year: Never true   Ran Out of Food in the Last Year: Never true  Transportation Needs: No Transportation Needs   Lack of Transportation (Medical): No   Lack of Transportation (Non-Medical): No  Physical Activity: Not on file  Stress: Not on file  Social Connections: Not on file  Intimate Partner Violence: Not on file      Family History   Problem Relation Age of Onset   Hypertension Mother    Hypertension Father     Vitals:   06/30/21 1447  BP: 107/82  Pulse: (!) 116  Temp: 98.3 F (36.8 C)  SpO2: 97%  Weight: (!) 169.8 kg (374 lb 6.4 oz)  Height: 5\' 11"  (1.803 m)    PHYSICAL EXAM: General:  Obese male SOB with exertion HEENT: normal Neck: supple. JVP to jaw  Carotids 2+ bilat; no bruits. No lymphadenopathy or thryomegaly appreciated. Cor: Tachy regular +s3 Lungs: clear Abdomen: obese soft, nontender, nondistended. No hepatosplenomegaly. No bruits or masses. Good bowel sounds. Extremities: no cyanosis, clubbing, rash, 2+ edema Neuro: alert & orientedx3, cranial nerves grossly intact. moves all 4 extremities w/o difficulty. Affect pleasant  ASSESSMENT & PLAN:  1. 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 Will switch lasix to torsemide 40 mg bid. Call clinic or come to ER if symptoms worsening. We will see him back at the end of the week  - Continue Farxiga 10 - Continue Entresto 49/51 - Continue digoxin 0.125 - Continue spiro 12.5 - Off carvedilol due to low output - I remained concerned about possible need for advanced therapies particularly with resting tachycardia. Low threshold to proceed with CPX - Not transplant candidate due to size but would be VAD candidate likely  2. HTN, severe - Blood pressure now on low side  3. Frequent PVCs - Zio 12/21 4.3% PVCs - probably not high enough burden to cause CM  - needs sleep study - does not have insurance to cover  4. IVDA (heroin) - reports no use since 12/20  5. Morbid obesity - needs  weight loss and sleep study - consider GLP1RA - no change   Total time spent 45 minutes. Over half that time spent discussing above.   1/21, MD  8:05 PM

## 2021-07-09 ENCOUNTER — Ambulatory Visit (HOSPITAL_BASED_OUTPATIENT_CLINIC_OR_DEPARTMENT_OTHER)
Admission: RE | Admit: 2021-07-09 | Discharge: 2021-07-09 | Disposition: A | Discharge status: Home / Self Care | Payer: BC Managed Care – PPO | Source: Ambulatory Visit | Attending: Cardiology | Admitting: Cardiology

## 2021-07-09 ENCOUNTER — Other Ambulatory Visit (HOSPITAL_COMMUNITY): Payer: Self-pay

## 2021-07-09 ENCOUNTER — Encounter (HOSPITAL_COMMUNITY): Payer: Self-pay

## 2021-07-09 ENCOUNTER — Other Ambulatory Visit: Payer: Self-pay

## 2021-07-09 ENCOUNTER — Encounter (HOSPITAL_COMMUNITY): Payer: Self-pay | Admitting: Internal Medicine

## 2021-07-09 ENCOUNTER — Ambulatory Visit (HOSPITAL_COMMUNITY)
Admission: RE | Admit: 2021-07-09 | Discharge: 2021-07-09 | Disposition: A | Discharge status: Home / Self Care | Payer: BC Managed Care – PPO | Source: Ambulatory Visit | Attending: Internal Medicine | Admitting: Internal Medicine

## 2021-07-09 VITALS — BP 106/72 | HR 106 | Temp 98.2°F | Wt 349.6 lb

## 2021-07-09 DIAGNOSIS — I110 Hypertensive heart disease with heart failure: Secondary | ICD-10-CM | POA: Insufficient documentation

## 2021-07-09 DIAGNOSIS — I5022 Chronic systolic (congestive) heart failure: Secondary | ICD-10-CM | POA: Diagnosis not present

## 2021-07-09 DIAGNOSIS — I5043 Acute on chronic combined systolic (congestive) and diastolic (congestive) heart failure: Secondary | ICD-10-CM

## 2021-07-09 DIAGNOSIS — G4733 Obstructive sleep apnea (adult) (pediatric): Secondary | ICD-10-CM

## 2021-07-09 DIAGNOSIS — I5023 Acute on chronic systolic (congestive) heart failure: Secondary | ICD-10-CM | POA: Diagnosis not present

## 2021-07-09 DIAGNOSIS — F1990 Other psychoactive substance use, unspecified, uncomplicated: Secondary | ICD-10-CM

## 2021-07-09 LAB — BASIC METABOLIC PANEL
Anion gap: 11 (ref 5–15)
BUN: 21 mg/dL — ABNORMAL HIGH (ref 6–20)
CO2: 24 mmol/L (ref 22–32)
Calcium: 8.9 mg/dL (ref 8.9–10.3)
Chloride: 101 mmol/L (ref 98–111)
Creatinine, Ser: 1.03 mg/dL (ref 0.61–1.24)
GFR, Estimated: >60 mL/min (ref >60)
Glucose, Bld: 108 mg/dL — ABNORMAL HIGH (ref 70–99)
Potassium: 3.4 mmol/L — ABNORMAL LOW (ref 3.5–5.1)
Sodium: 136 mmol/L (ref 135–145)

## 2021-07-09 LAB — CBC
HCT: 47.4 % (ref 39.0–52.0)
Hemoglobin: 15.4 g/dL (ref 13.0–17.0)
MCH: 26.7 pg (ref 26.0–34.0)
MCHC: 32.5 g/dL (ref 30.0–36.0)
MCV: 82.3 fL (ref 80.0–100.0)
Platelets: 290 10*3/uL (ref 150–400)
RBC: 5.76 MIL/uL (ref 4.22–5.81)
RDW: 16 % — ABNORMAL HIGH (ref 11.5–15.5)
WBC: 9.7 10*3/uL (ref 4.0–10.5)
nRBC: 0 % (ref 0.0–0.2)

## 2021-07-09 LAB — ECHOCARDIOGRAM COMPLETE
Calc EF: 25.2 %
S' Lateral: 6.1 cm
Single Plane A2C EF: 12.9 %
Single Plane A4C EF: 36.7 %

## 2021-07-09 LAB — DIGOXIN LEVEL: Digoxin Level: 0.2 ng/mL — ABNORMAL LOW (ref 0.8–2.0)

## 2021-07-09 LAB — BRAIN NATRIURETIC PEPTIDE: B Natriuretic Peptide: 199.6 pg/mL — ABNORMAL HIGH (ref 0.0–100.0)

## 2021-07-09 MED ORDER — PERFLUTREN LIPID MICROSPHERE
1.0000 mL | INTRAVENOUS | Status: DC | PRN
  Administered 2021-07-09: 2 mL via INTRAVENOUS
  Filled 2021-07-09: qty 10

## 2021-07-09 MED ORDER — POTASSIUM CHLORIDE CRYS ER 20 MEQ PO TBCR
EXTENDED_RELEASE_TABLET | ORAL | 3 refills | Status: DC
  Filled 2021-07-09: qty 240, fill #0

## 2021-07-09 MED ORDER — METOLAZONE 2.5 MG PO TABS
2.5000 mg | ORAL_TABLET | ORAL | 1 refills | Status: DC
  Filled 2021-07-09: qty 30, 210d supply, fill #0
  Filled 2021-12-31 – 2022-03-10 (×3): qty 12, 84d supply, fill #0

## 2021-07-09 NOTE — Patient Instructions (Addendum)
Take Metolazone once per week on Wednesday with 2 extra potassium tablets.  Scheduled for CPX Friday 07/18/21 at 9am. Hold Torsemide and Spironolactone that morning; you may take when you get home. Do not smoke 2 hours prior. No caffeine that morning. Make sure you wear comfy clothes and tennis shoes.  Call VAD coordinators with any worsening heart failure symptoms. [phone] Return for follow up with Dr Gala Romney in 2 weeks

## 2021-07-09 NOTE — Progress Notes (Signed)
MCS EDUCATION NOTE:                VAD evaluation consent reviewed and signed by Clarene Reamer. Initial VAD teaching completed with pt.   VAD educational packet including "Understanding Your Options with Advanced Heart Failure", "[place] Patient Agreement for VAD Evaluation and Potential Implantation" consent, and [employer] "Heartmate 3 Left Ventricular Device (LVAD) Patient Guide", Heartmate 3 Left Ventricular Assist System Patient Education Program DVD", "[place] HM III Patient Education", "[place] Mechanical Circulatory Support Program", and "Decision Aids for Left Ventricular Assist Device" previously provided to patient. Pt reports he has this at home.   Explained need for 24/7 care when pt is discharged home due to sternal precautions, adaptation to living on support, emotional support, consistent and meticulous exit site care and management, medication adherence and high volume of follow up visits with the VAD Clinic after discharge; both pt and caregiver verbalized understanding of above.   Explained that LVAD can be implanted for two indications in the setting of advanced left ventricular heart failure treatment:  Bridge to transplant - used for patients who cannot safely wait for heart transplant without this device.  Or    Destination therapy - used for patients until end of life or recovery of heart function.  Patient acknowledges that the indication at this point in time for LVAD therapy would be for destination therapy due to currently smoking and BMI.   Provided brief equipment overview and demonstration with HeartMate III training loop demonstrated at previous appt:   a) power module   b) system controller   c) universal Magazine features editor   d) battery clips   e) Batteries   f)  Perc lock   g) Percutaneous lead  Reviewed and supplied a copy of home inspection check list stressing that only three pronged grounded power outlets can be used for VAD equipment. [employer] will  confirmed home has electrical outlets that will support the equipment and let us know. He has access working telephone.  Identified the following lifestyle modifications while living on MCS:    1. No driving for at least three months and then only if doctor gives permission to do so.   2. No tub baths while pump implanted, and shower only when doctor gives permission.   3. No swimming or submersion in water while implanted with pump.   4. No contact sports or engaging in jumping activities.   5. Always have a backup controller, charged spare batteries, and battery clips nearby at all times in case of emergency.   6. Call the doctor or hospital contact person if any change in how the pump sounds, feels, or works.   7. Plan to sleep only when connected to the power module.   8. Do not sleep on your stomach.   9. Keep a backup system controller, charged batteries, battery clips, and flashlight near you during sleep in case of electrical power outage.   10. Exit site care including dressing changes, monitoring for infection, and importance of keeping percutaneous lead stabilized at all times.     Extended the option to have one of our current patients and caregiver(s) come to talk with him about living on support to assist with decision making. He does not wish to speak with a current patient at this time. Will extend offer again in the future.   Reviewed pictures of VAD drive line, site care, dressing changes, and drive line stabilization including securement attachment device and abdominal  binder. Discussed with pt and family that they will be required to purchase dressing supplies as long as patient has the VAD in place.   Intermacs patient survival statistics through September 2022 reviewed with patient and caregiver as follows:                                                The patient understands that from this discussion it does not mean that they will receive the device, but that depends  on an extensive evaluation process. The patient is aware of the fact that if at anytime they want to stop the evaluation process they can.  All questions have been answered at this time and contact information was provided should they encounter any further questions. He is agreeable at this time to the evaluation process and will move forward.    Alyce Pagan RN VAD Coordinator  Office: [phone]  24/7 Pager: [phone]

## 2021-07-09 NOTE — Progress Notes (Addendum)
Patient presents for 1 week f/u in [facility] today alone. He had complete echo this morning.  Pt reports he is feeling much better since starting Torsemide 40 mg BID. Notes improved urine output. Denies shortness of breath. He has not had to take any Metolazone since last clinic visit. He reports he does not weight consistently at home. He is still drinking >2 L per day, and while he is not adding salt to food, he is still eating what he wants. Wt is up 2 lbs today. Per Dr Haroldine Laws pt instructed pt to take Metolazone 2.5 mg weekly with 2 extra K. Pt verbalized understanding. Updated prescription sent to pt's pharmacy.   Currently smoking < 1 pack of cigarettes per day. Encouraged cessation if able.   Continued discussion regarding VAD evaluation. See separate note for documentation. Consent forms signed today. He thinks his sister who lives in [place] will able to help with caregiver role, but she recently had a baby, so he is unsure how available she will be. Pt verbalized concerns about pain management post-op with his previous opioid addiction issues. We will need to discuss pain management plan as a team prior to implant.   Per Dr Haroldine Laws will get CPX. After CPX will discuss timing of scheduling VAD evaluation tests. CPX scheduled for next Friday 07/18/21 at 9.    Vital Signs:  HR: 106 ST w/ occasional PVCs BP: 106/72 (84) SPO2: 96 %   Weight: 349.6 lb lb w/o eqt Last weight: 347.4 lb Home weights:  lbs- does not weigh consistently at home   Symptom YES NO DETAILS  Angina  x Activity:  Claudication  x How Far:  Syncope  x When:  Stroke  x   Orthopnea  x How many pillows: Improved with starting Torsemide. Able to sleep comfortably laying down   PND  x How often:  CPAP   How many hours:  Pedal Edema x    Abdominal Fullness     Nausea / Vomit  x   Diaphoresis  x When:  Shortness of Breath  x Activity: Improved with Torsemide. Taking less rest breaks at work  Palpitations x  When:  Occasional with activity  ICD shock     Bleeding S/S  x   Tea-colored Urine  x   Hospitalizations  x   Emergency Room  x   Other MD     Activity   Fluid Thirsty all the time. Drinking > 2 liters per day  Diet Regular diet. Eats out a lot. Does not add salt to food    Patient Instructions:  Take Metolazone once per week on Wednesday with 2 extra potassium tablets.  Scheduled for CPX Friday 07/18/21 at [facility] and Spironolactone that morning; you may take when you get home. Do not smoke 2 hours prior. No caffeine that morning. Make sure you wear comfy clothes and tennis shoes.  Call VAD coordinators with any worsening heart failure symptoms. [phone] Return for follow up with Dr Haroldine Laws in 2 weeks   K 3.4 today. Discussed with Dr Haroldine Laws. Will increase K to 40 meq BID, with an extra 60 meq today. Will repeat BMET in 1 week when pt comes for CPX. Called and spoke with pt above the above. He verbalized understanding of all instructions.    Emerson Monte RN [place] Coordinator  Office: [phone]  24/7 Pager: [phone]

## 2021-07-10 ENCOUNTER — Other Ambulatory Visit (HOSPITAL_COMMUNITY): Payer: Self-pay | Admitting: Unknown Physician Specialty

## 2021-07-10 DIAGNOSIS — I5023 Acute on chronic systolic (congestive) heart failure: Secondary | ICD-10-CM

## 2021-07-12 NOTE — Progress Notes (Signed)
ADVANCED HF CLINIC NOTE  Primary Care: Pcp, No Primary Cardiologist: Kirk Ruths, MD  HPI:  Donald Murray is a morbidly obese 36 y.o. male with HTN, previous IVDA (heroin), tobacco use and systolic HF (onset 0000000) referred by Dr. [name] for further management of his HF.   Echo 10/20 EF 60-65%   Admitted to [facility] 9/21 with new onset HF in setting of severe HTN 174/128.Marland Kitchen ECG with sinus tach and frequent PVCs.   Echo 02/16/20: EF 20-25% Moderate RV dysfunction.   I saw him in HF Consultation for the first time 04/24/20. 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 Lasix and Entresto increased. Digoxin added. Zio placed to quantify PVC burden - 4.3%.  Admitted 12/22 with A/C CHF exacerbation. Echo LVEF <20% w/ global HK, no visible thrombus, + mild MR, RV normal. R/LHC was recommended after diuresis to r/o CAD however pt requested for cath to be completed as outpatient.  Underwent cath 06/12/21 with normal cors. EF < 10% Elevated filling pressures (PCWP 28) and low output (CI 1.5). Lasix increased to 80 bid.  Seen last week for worsening HF/volume overload. Treated with IV lasix and lasix switch to torsemide. Here for f/u. Says he is feeling better. Weight down about 20 pounds. Orthopnea has resolved. Able to continue to work at [employer]. Mild dyspnea. Edema improved.    RHC 1/23 Ao = 91/72 (81) LV = 108/37 RA = 9 RV = 51/15 PA = 66/38 (50) PCW = 28 Fick cardiac output/index = 3.9/1.5 PVR = 5.7 SVR = 1,484 Ao sat = 98% PA sat = 55%, 54% PAPI = 3.1  Zio 12/21 Sinus - average HR 104 4.3% PVCs    Past Medical History:  Diagnosis Date   CHF (congestive heart failure) (HCC)    Hypertension    Obesity    Snoring     Current Outpatient Medications  Medication Sig Dispense Refill   dapagliflozin propanediol (FARXIGA) 10 MG TABS tablet Take 1 tablet (10 mg total) by mouth daily before breakfast. 90 tablet 3   digoxin (LANOXIN) 0.125 MG tablet Take 1 tablet (0.125 mg total) by  mouth daily. 30 tablet 2   sacubitril-valsartan (ENTRESTO) 49-51 MG Take 1 tablet by mouth 2 (two) times daily. 60 tablet 4   spironolactone (ALDACTONE) 25 MG tablet Take 1 tablet (25 mg total) by mouth daily. 30 tablet 2   torsemide (DEMADEX) 20 MG tablet Take 2 tablets (40 mg total) by mouth 2 (two) times daily. 360 tablet 3   metolazone (ZAROXOLYN) 2.5 MG tablet Take 1 tablet (2.5 mg total) by mouth once a week on Wednesdays with 2 extra tablets of potassium 30 tablet 1   potassium chloride SA (KLOR-CON M) 20 MEQ tablet Take 2 tablets (40 meq) twice daily. Take an additional 2 tablets (40 meq) on days when you take Metolazone.. 240 tablet 3   No current facility-administered medications for this encounter.    No Known Allergies    Social History   Socioeconomic History   Marital status: Single    Spouse name: Not on file   Number of children: Not on file   Years of education: Not on file   Highest education level: Not on file  Occupational History   Not on file  Tobacco Use   Smoking status: Every Day    Packs/day: 1.00    Years: 16.00    Pack years: 16.00    Types: Cigarettes   Smokeless tobacco: Never  Vaping Use   Vaping  Use: Some days   Substances: Nicotine  Substance and Sexual Activity   Alcohol use: Not Currently    Comment: Pt stated "2 years clean"   Drug use: Not Currently    Comment: Pt stated "It was opiates"   Sexual activity: Not on file  Other Topics Concern   Not on file  Social History Narrative   Not on file   Social Determinants of Health   Financial Resource Strain: Low Risk    Difficulty of Paying Living Expenses: Not very hard  Food Insecurity: No Food Insecurity   Worried About Running Out of Food in the Last Year: Never true   Ran Out of Food in the Last Year: Never true  Transportation Needs: No Transportation Needs   Lack of Transportation (Medical): No   Lack of Transportation (Non-Medical): No  Physical Activity: Not on file   Stress: Not on file  Social Connections: Not on file  Intimate Partner Violence: Not on file      Family History  Problem Relation Age of Onset   Hypertension Mother    Hypertension Father     Vitals:   07/09/21 0904  BP: 106/72  Pulse: (!) 106  Temp: 98.2 F (36.8 C)  SpO2: 96%  Weight: (!) 158.6 kg (349 lb 9.6 oz)   Wt Readings from Last 3 Encounters:  07/09/21 (!) 158.6 kg (349 lb 9.6 oz)  06/30/21 (!) 169.8 kg (374 lb 6.4 oz)  06/18/21 (!) 156.9 kg (345 lb 12.8 oz)    PHYSICAL EXAM: General:  Obese male No resp difficulty HEENT: normal Neck: supple. JVP hard to see Carotids 2+ bilat; no bruits. No lymphadenopathy or thryomegaly appreciated. Cor: PMI nondisplaced. Regular tachy Lungs: clear Abdomen: obese soft, nontender, nondistended. No hepatosplenomegaly. No bruits or masses. Good bowel sounds. Extremities: no cyanosis, clubbing, rash, 1+ edema Neuro: alert & orientedx3, cranial nerves grossly intact. moves all 4 extremities w/o difficulty. Affect pleasant   ASSESSMENT & PLAN:  1. Acute on chronic systolic HF - diagnosed 0000000 Echo EF 20-25% RV moderately HK - suspect HTN vs PVC-mediated (PVC burden 4.3% - probably not high enough to cause CM) - cath 1/23 no CAD EF < 10% - NYHA III-IIIB - Symptomatically improved with diuresis but remains fluid overloaded - Add metolazone once per week on Wednesday with 2 extra potassium tablets.  - Continue Farxiga 10 - Continue Entresto 49/51 - Continue digoxin 0.125 - Continue spiro 12.5 - Off carvedilol due to low output - I remained concerned about possible need for advanced therapies particularly with resting tachycardia. Will get CPX for further risk stratification - Body mass index is 48.76 kg/m. - Not transplant candidate due to size but would be VAD candidate likely  2. HTN, severe - Blood pressure now on low side  3. Frequent PVCs - Zio 12/21 4.3% PVCs - probably not high enough burden to cause CM  -  needs sleep study - does not have insurance to cover  4. IVDA (heroin) - reports no use since 12/20  5. Morbid obesity - needs weight loss and sleep study - consider GLP1RA - no change  Total time spent > 40 minutes. Over half that time spent discussing above.   Glori Bickers, MD  2:41 PM

## 2021-07-18 ENCOUNTER — Telehealth: Payer: Self-pay | Admitting: *Deleted

## 2021-07-18 ENCOUNTER — Other Ambulatory Visit (HOSPITAL_COMMUNITY): Payer: Self-pay | Admitting: *Deleted

## 2021-07-18 ENCOUNTER — Other Ambulatory Visit: Payer: Self-pay

## 2021-07-18 ENCOUNTER — Ambulatory Visit (HOSPITAL_COMMUNITY): Payer: BC Managed Care – PPO

## 2021-07-18 ENCOUNTER — Ambulatory Visit (HOSPITAL_COMMUNITY)
Admission: RE | Admit: 2021-07-18 | Discharge: 2021-07-18 | Disposition: A | Discharge status: Home / Self Care | Payer: BC Managed Care – PPO | Source: Ambulatory Visit | Attending: Cardiology | Admitting: Cardiology

## 2021-07-18 DIAGNOSIS — I5043 Acute on chronic combined systolic (congestive) and diastolic (congestive) heart failure: Secondary | ICD-10-CM | POA: Insufficient documentation

## 2021-07-18 DIAGNOSIS — I5023 Acute on chronic systolic (congestive) heart failure: Secondary | ICD-10-CM

## 2021-07-18 LAB — BASIC METABOLIC PANEL
Anion gap: 15 (ref 5–15)
BUN: 26 mg/dL — ABNORMAL HIGH (ref 6–20)
CO2: 20 mmol/L — ABNORMAL LOW (ref 22–32)
Calcium: 9.1 mg/dL (ref 8.9–10.3)
Chloride: 99 mmol/L (ref 98–111)
Creatinine, Ser: 1.45 mg/dL — ABNORMAL HIGH (ref 0.61–1.24)
GFR, Estimated: >60 mL/min (ref >60)
Glucose, Bld: 120 mg/dL — ABNORMAL HIGH (ref 70–99)
Potassium: 3.2 mmol/L — ABNORMAL LOW (ref 3.5–5.1)
Sodium: 134 mmol/L — ABNORMAL LOW (ref 135–145)

## 2021-07-18 NOTE — Telephone Encounter (Signed)
-----   Message from Quintella Reichert, MD sent at 06/29/2021  7:00 PM EST ----- Please let patient know that they have sleep apnea.  Recommend therapeutic CPAP titration for treatment of patient's sleep disordered breathing.  If unable to perform an in lab titration then initiate ResMed auto CPAP from 4 to 15cm H2O with heated humidity and mask of choice and overnight pulse ox on CPAP.

## 2021-07-18 NOTE — Telephone Encounter (Signed)
Patient notified of HST results and recommendations. He agrees to proceed with CPAP titration. PA request submitted to [facility].

## 2021-07-23 ENCOUNTER — Encounter (HOSPITAL_COMMUNITY): Payer: BC Managed Care – PPO

## 2021-07-28 ENCOUNTER — Encounter (HOSPITAL_COMMUNITY): Payer: BC Managed Care – PPO

## 2021-07-30 ENCOUNTER — Encounter (HOSPITAL_COMMUNITY): Payer: Self-pay

## 2021-08-01 ENCOUNTER — Other Ambulatory Visit (HOSPITAL_COMMUNITY): Payer: Self-pay

## 2021-08-01 ENCOUNTER — Ambulatory Visit (HOSPITAL_COMMUNITY)
Admission: RE | Admit: 2021-08-01 | Discharge: 2021-08-01 | Disposition: A | Discharge status: Home / Self Care | Payer: BC Managed Care – PPO | Source: Ambulatory Visit | Attending: Cardiology | Admitting: Cardiology

## 2021-08-01 ENCOUNTER — Other Ambulatory Visit (HOSPITAL_COMMUNITY): Payer: Self-pay | Admitting: Family Medicine

## 2021-08-01 ENCOUNTER — Other Ambulatory Visit: Payer: Self-pay

## 2021-08-01 VITALS — BP 131/86 | HR 112 | Temp 98.2°F | Ht 71.0 in | Wt 354.4 lb

## 2021-08-01 DIAGNOSIS — Z6841 Body Mass Index (BMI) 40.0 and over, adult: Secondary | ICD-10-CM | POA: Insufficient documentation

## 2021-08-01 DIAGNOSIS — F1111 Opioid abuse, in remission: Secondary | ICD-10-CM | POA: Insufficient documentation

## 2021-08-01 DIAGNOSIS — Z79899 Other long term (current) drug therapy: Secondary | ICD-10-CM | POA: Diagnosis not present

## 2021-08-01 DIAGNOSIS — I493 Ventricular premature depolarization: Secondary | ICD-10-CM | POA: Diagnosis not present

## 2021-08-01 DIAGNOSIS — I110 Hypertensive heart disease with heart failure: Secondary | ICD-10-CM | POA: Diagnosis not present

## 2021-08-01 DIAGNOSIS — F17210 Nicotine dependence, cigarettes, uncomplicated: Secondary | ICD-10-CM | POA: Diagnosis not present

## 2021-08-01 DIAGNOSIS — I5023 Acute on chronic systolic (congestive) heart failure: Secondary | ICD-10-CM | POA: Insufficient documentation

## 2021-08-01 DIAGNOSIS — Z7984 Long term (current) use of oral hypoglycemic drugs: Secondary | ICD-10-CM | POA: Diagnosis not present

## 2021-08-01 DIAGNOSIS — E876 Hypokalemia: Secondary | ICD-10-CM | POA: Insufficient documentation

## 2021-08-01 DIAGNOSIS — I10 Essential (primary) hypertension: Secondary | ICD-10-CM | POA: Diagnosis not present

## 2021-08-01 DIAGNOSIS — I50812 Chronic right heart failure: Secondary | ICD-10-CM | POA: Diagnosis not present

## 2021-08-01 LAB — BASIC METABOLIC PANEL
Anion gap: 10 (ref 5–15)
BUN: 13 mg/dL (ref 6–20)
CO2: 28 mmol/L (ref 22–32)
Calcium: 8.6 mg/dL — ABNORMAL LOW (ref 8.9–10.3)
Chloride: 101 mmol/L (ref 98–111)
Creatinine, Ser: 1.07 mg/dL (ref 0.61–1.24)
GFR, Estimated: >60 mL/min (ref >60)
Glucose, Bld: 92 mg/dL (ref 70–99)
Potassium: 3.6 mmol/L (ref 3.5–5.1)
Sodium: 139 mmol/L (ref 135–145)

## 2021-08-01 MED ORDER — SPIRONOLACTONE 25 MG PO TABS
25.0000 mg | ORAL_TABLET | Freq: Every day | ORAL | 3 refills | Status: DC
  Filled 2021-08-01 – 2021-08-22 (×2): qty 90, 90d supply, fill #0
  Filled 2021-12-31 – 2022-03-10 (×3): qty 90, 90d supply, fill #1

## 2021-08-01 MED ORDER — DIGOXIN 125 MCG PO TABS
0.1250 mg | ORAL_TABLET | Freq: Every day | ORAL | 3 refills | Status: DC
  Filled 2021-08-01 – 2021-08-22 (×2): qty 90, 90d supply, fill #0
  Filled 2021-12-01: qty 90, 90d supply, fill #1
  Filled 2022-03-10 (×2): qty 90, 90d supply, fill #2

## 2021-08-01 MED ORDER — SACUBITRIL-VALSARTAN 97-103 MG PO TABS
1.0000 | ORAL_TABLET | Freq: Two times a day (BID) | ORAL | 3 refills | Status: DC
  Filled 2021-08-01 – 2021-09-23 (×2): qty 180, 90d supply, fill #0
  Filled 2021-12-31: qty 180, 90d supply, fill #1

## 2021-08-01 NOTE — Patient Instructions (Addendum)
Increase Entresto to 97/103 twice daily - Rx sent  ?Get daily wts - call if wt is increasing ?Limit fluid to 2 liters/day if possible ?Decrease smoking if possible ?Return in 3 weeks.  ?

## 2021-08-01 NOTE — Progress Notes (Signed)
? ?ADVANCED HF CLINIC NOTE ? ?Primary Care: Pcp, No ?Primary Cardiologist: Olga Millers, MD ? ?HPI: ?Donald Murray is a morbidly obese 36 y.o. male with HTN, previous IVDA (heroin), tobacco use and systolic HF (onset 9/21) referred by Dr. Jens Som for further management of his HF.  ? ?Echo 10/20 EF 60-65%  ? ?Admitted to [facility] 9/21 with new onset HF in setting of severe HTN 174/128. ECG with sinus tach and frequent PVCs.  ? ?Echo 02/16/20: EF 20-25% Moderate RV dysfunction.  ? ?Dr Gala Romney saw HF Consultation for the first time 04/24/20. 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 Lasix and Entresto increased. Digoxin added. Zio placed to quantify PVC burden - 4.3%. ? ?Admitted 12/22 with A/C CHF exacerbation. Echo LVEF <20% w/ global HK, no visible thrombus, + mild MR, RV normal. R/LHC was recommended after diuresis to r/o CAD however pt requested for cath to be completed as outpatient. ? ?Underwent cath 06/12/21 with normal cors. EF < 10% Elevated filling pressures (PCWP 28) and low output (CI 1.5). Lasix increased to 80 bid. ? ?He was seen by Dr Gala Romney on 07/09/21. Volume overloaded. Lasix stopped and switched to torsemide.  ? ?Had CPX test 07/18/21. RER 1 VO2 21.6 Slope 46 ? ?Overall feeling fine. SOB after walking 1 block. SOB with steps.  DeniesPND/Orthopnea. Appetite ok. No fever or chills. He has not been weighing at home. Smoking 1/2 PPD. Attends NA meetings. Taking all medications. Works full time at [employer]. He is on parole but unable to complete community service due to dyspnea. His sister lives in [place] and is planning relocate to [place].  ? ?RHC 1/23 ?Ao = 91/72 (81) ?LV = 108/37 ?RA = 9 ?RV = 51/15 ?PA = 66/38 (50) ?PCW = 28 ?Fick cardiac output/index = 3.9/1.5 ?PVR = 5.7 ?SVR = 1,484 ?Ao sat = 98% ?PA sat = 55%, 54% ?PAPI = 3.1 ? ?Zio 12/21 ?Sinus - average HR 104 ?4.3% PVCs ? ? ? ?Past Medical History:  ?Diagnosis Date  ? CHF (congestive heart failure) (HCC)   ? Hypertension   ? Obesity   ? Snoring    ? ? ?Current Outpatient Medications  ?Medication Sig Dispense Refill  ? dapagliflozin propanediol (FARXIGA) 10 MG TABS tablet Take 1 tablet (10 mg total) by mouth daily before breakfast. 90 tablet 3  ? metolazone (ZAROXOLYN) 2.5 MG tablet Take 1 tablet (2.5 mg total) by mouth once a week on Wednesdays with 2 extra tablets of potassium 30 tablet 1  ? potassium chloride SA (KLOR-CON M) 20 MEQ tablet Take 2 tablets (40 meq) twice daily. Take an additional 2 tablets (40 meq) on days when you take Metolazone.. 240 tablet 3  ? sacubitril-valsartan (ENTRESTO) 49-51 MG Take 1 tablet by mouth 2 (two) times daily. 60 tablet 4  ? torsemide (DEMADEX) 20 MG tablet Take 2 tablets (40 mg total) by mouth 2 (two) times daily. 360 tablet 3  ? digoxin (LANOXIN) 0.125 MG tablet Take 1 tablet (0.125 mg total) by mouth daily. 90 tablet 3  ? spironolactone (ALDACTONE) 25 MG tablet Take 1 tablet (25 mg total) by mouth daily. 90 tablet 3  ? ?No current facility-administered medications for this encounter.  ? ? ?No Known Allergies ? ?  ?Social History  ? ?Socioeconomic History  ? Marital status: Single  ?  Spouse name: Not on file  ? Number of children: Not on file  ? Years of education: Not on file  ? Highest education level: Not on file  ?Occupational  History  ? Not on file  ?Tobacco Use  ? Smoking status: Every Day  ?  Packs/day: 1.00  ?  Years: 16.00  ?  Pack years: 16.00  ?  Types: Cigarettes  ? Smokeless tobacco: Never  ?Vaping Use  ? Vaping Use: Some days  ? Substances: Nicotine  ?Substance and Sexual Activity  ? Alcohol use: Not Currently  ?  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 Drug use: Not Currently  ?  Comment: Pt stated "It was opiates"  ? Sexual activity: Not on file  ?Other Topics Concern  ? Not on file  ?Social History Narrative  ? Not on file  ? ?Social Determinants of Health  ? ?Financial Resource Strain: Low Risk   ? Difficulty of Paying Living Expenses: Not very hard  ?Food Insecurity: No Food Insecurity  ? Worried  About Programme researcher, broadcasting/film/video in the Last Year: Never true  ? Ran Out of Food in the Last Year: Never true  ?Transportation Needs: No Transportation Needs  ? Lack of Transportation (Medical): No  ? Lack of Transportation (Non-Medical): No  ?Physical Activity: Not on file  ?Stress: Not on file  ?Social Connections: Not on file  ?Intimate Partner Violence: Not on file  ? ? ?  ?Family History  ?Problem Relation Age of Onset  ? Hypertension Mother   ? Hypertension Father   ? ? ?Vitals:  ? 08/01/21 1106 08/01/21 1109  ?BP: (!) 129/103 131/86  ?Pulse: (!) 112   ?Temp: 98.2 ?F (36.8 ?C)   ?SpO2: 96%   ?Weight: (!) 160.8 kg (354 lb 6.4 oz)   ?Height: 5\' 11"  (1.803 m)   ? ?Wt Readings from Last 3 Encounters:  ?08/01/21 (!) 160.8 kg (354 lb 6.4 oz)  ?07/09/21 (!) 158.6 kg (349 lb 9.6 oz)  ?06/30/21 (!) 169.8 kg (374 lb 6.4 oz)  ?  ?General:  No resp difficulty ?HEENT: normal ?Neck: supple. JVP 6-7 . Carotids 2+ bilat; no bruits. No lymphadenopathy or thryomegaly appreciated. ?Cor: PMI nondisplaced. Regular rate & rhythm. No rubs, gallops or murmurs. ?Lungs: clear ?Abdomen: soft, nontender, nondistended. No hepatosplenomegaly. No bruits or masses. Good bowel sounds. ?Extremities: no cyanosis, clubbing, rash, R and LLE trace edema ?Neuro: alert & orientedx3, cranial nerves grossly intact. moves all 4 extremities w/o difficulty. Affect pleasant ? ?ASSESSMENT & PLAN: ? ?1. Acute on chronic systolic HF ?- diagnosed 9/21 Echo EF 20-25% RV moderately HK ?- suspect HTN vs PVC-mediated (PVC burden 4.3% - probably not high enough to cause CM) ?- cath 1/23 no CAD EF < 10% ?- CPX 21.6 Slope 46 RER 1 ?- NYHA III. Volume status ok. Continue current dose of torsemide and metolazone.  ?- Continue Farxiga 10 ?- Increase entresto 97-103 mg twice a day ?- Continue digoxin 0.125 ?- Continue spiro 25 mg daily.  ?-Off carvedilol due to low output ?-  Body mass index is 49.43 kg/m?. ?- Not transplant candidate due to size and ongoing tobacco abuse.  Discussed cessation  ?- May be VAD candidate but he does have a reliable caregiver. ? ?2. HTN, severe ?- Stable.  ? ?3. Frequent PVCs ?- Zio 12/21 4.3% PVCs - probably not high enough burden to cause CM  ?- needs sleep study - does not have insurance to cover ? ?4. IVDA (heroin) ?- reports no use since 12/20 ?- Attends NA ? ?5. Morbid obesity ?-Body mass index is 49.43 kg/m?. ?- needs weight loss and sleep study ?- consider GLP1RA ?- no  change ? ?Check BMET today and in 7 days.  ? ?Follow up with Dr Gala Romney in 2-3 weeks. Discussed smoking cessation.  ? ?Tonye Becket, NP  ?11:23 AM ? ? ?

## 2021-08-01 NOTE — Progress Notes (Signed)
Patient presents for 2 week f/u in [facility] today alone.  ? ?Pt reports he is feeling good on current medication. BP elevated today, re-checked and remains elevated. Will increase Entresto per Darrick Grinder, NP.  ? ?Weight up today from last visit, patient is not weighing at home (does have scale). He is drinking > 2L per day and continues to eat available food (fast food). Amy discussed importance of keeping fluid intake < 2 liters/day and eating healthier food that is low in salt.  ? ?Still smoking < 1 pack of cigarettes per day. Patient will try to decrease if possible and if unable, will discuss cessation aides next visit.  ? ?Continued discussion regarding VAD evaluation. His sister who is only near relative will be re-locating to [place] with husband for his work schedule. No alternative caregiver identified.  ?  ?Vital Signs:  ?Temp: 98.2 ?HR: 112 ?BP: 129/103 (114) 131/86 (102) ?SPO2: 96% on RA ?  ?Weight: 354.4 lb lb w/o eqt ?Last weight: 349.6 lb ?Home weights:  does not weigh consistently at home ? ? ?Symptom YES NO DETAILS  ?Angina  x Activity:  ?Claudication  x How Far:  ?Syncope  x When:  ?Stroke  x   ?Orthopnea  x How many pillows: Improved with starting Torsemide. Able to sleep comfortably laying down   ?PND  x How often:  ?CPAP   How many hours:  ?Pedal Edema x    ?Abdominal Fullness  x   ?Nausea / Vomit  x   ?Diaphoresis  x When:  ?Shortness of Breath  x Activity: walking level one block; incline, stairs  ?Palpitations  x When: Occasional with activity  ?ICD shock   N/A  ?Hospitalizations  x   ?Emergency Room  x   ?Other MD  x   ?Activity Works four 10 hr days   ?Fluid Thirsty all the time. Drinking > 2 liters per day  ?Diet Regular diet. Eats out a lot. Does not add salt to food  ? ? ?Patient Instructions:  ?Increase Entresto to 97/103 twice daily - Rx sent  ?Get daily wts - call if wt is increasing ?Limit fluid to 2 liters/day if possible ?Decrease smoking if possible ?Return in 3 weeks.  ? ?   ?[name] RN ?VAD Coordinator  ?Office: [phone]  ?24/7 Pager: [phone]  ? ?

## 2021-08-11 ENCOUNTER — Other Ambulatory Visit (HOSPITAL_COMMUNITY): Payer: Self-pay

## 2021-08-21 ENCOUNTER — Other Ambulatory Visit: Payer: Self-pay | Admitting: Cardiology

## 2021-08-21 DIAGNOSIS — J069 Acute upper respiratory infection, unspecified: Secondary | ICD-10-CM | POA: Diagnosis not present

## 2021-08-21 DIAGNOSIS — R030 Elevated blood-pressure reading, without diagnosis of hypertension: Secondary | ICD-10-CM | POA: Diagnosis not present

## 2021-08-21 DIAGNOSIS — Z03818 Encounter for observation for suspected exposure to other biological agents ruled out: Secondary | ICD-10-CM | POA: Diagnosis not present

## 2021-08-21 DIAGNOSIS — G4733 Obstructive sleep apnea (adult) (pediatric): Secondary | ICD-10-CM

## 2021-08-21 DIAGNOSIS — Z20822 Contact with and (suspected) exposure to covid-19: Secondary | ICD-10-CM | POA: Diagnosis not present

## 2021-08-22 ENCOUNTER — Other Ambulatory Visit (HOSPITAL_COMMUNITY): Payer: Self-pay

## 2021-08-22 ENCOUNTER — Ambulatory Visit (HOSPITAL_COMMUNITY)
Admission: RE | Admit: 2021-08-22 | Discharge: 2021-08-22 | Disposition: A | Discharge status: Home / Self Care | Payer: BC Managed Care – PPO | Source: Ambulatory Visit | Attending: Internal Medicine | Admitting: Internal Medicine

## 2021-08-22 ENCOUNTER — Other Ambulatory Visit (HOSPITAL_COMMUNITY): Payer: Self-pay | Admitting: *Deleted

## 2021-08-22 ENCOUNTER — Telehealth (HOSPITAL_COMMUNITY): Payer: Self-pay | Admitting: *Deleted

## 2021-08-22 ENCOUNTER — Telehealth (HOSPITAL_COMMUNITY): Payer: Self-pay | Admitting: Pharmacist

## 2021-08-22 ENCOUNTER — Encounter (HOSPITAL_COMMUNITY): Payer: Self-pay

## 2021-08-22 VITALS — BP 133/96 | HR 115 | Temp 98.1°F | Ht 71.0 in | Wt 346.4 lb

## 2021-08-22 DIAGNOSIS — E876 Hypokalemia: Secondary | ICD-10-CM

## 2021-08-22 DIAGNOSIS — I5023 Acute on chronic systolic (congestive) heart failure: Secondary | ICD-10-CM | POA: Diagnosis not present

## 2021-08-22 DIAGNOSIS — Z733 Stress, not elsewhere classified: Secondary | ICD-10-CM | POA: Insufficient documentation

## 2021-08-22 DIAGNOSIS — I10 Essential (primary) hypertension: Secondary | ICD-10-CM | POA: Diagnosis not present

## 2021-08-22 DIAGNOSIS — R000 Tachycardia, unspecified: Secondary | ICD-10-CM

## 2021-08-22 DIAGNOSIS — Z596 Low income: Secondary | ICD-10-CM | POA: Diagnosis not present

## 2021-08-22 LAB — BASIC METABOLIC PANEL
Anion gap: 13 (ref 5–15)
BUN: 18 mg/dL (ref 6–20)
CO2: 30 mmol/L (ref 22–32)
Calcium: 9.1 mg/dL (ref 8.9–10.3)
Chloride: 93 mmol/L — ABNORMAL LOW (ref 98–111)
Creatinine, Ser: 1.24 mg/dL (ref 0.61–1.24)
GFR, Estimated: >60 mL/min (ref >60)
Glucose, Bld: 96 mg/dL (ref 70–99)
Potassium: 2.3 mmol/L — CL (ref 3.5–5.1)
Sodium: 136 mmol/L (ref 135–145)

## 2021-08-22 LAB — BRAIN NATRIURETIC PEPTIDE: B Natriuretic Peptide: 133.2 pg/mL — ABNORMAL HIGH (ref 0.0–100.0)

## 2021-08-22 MED ORDER — IVABRADINE HCL 5 MG PO TABS
5.0000 mg | ORAL_TABLET | Freq: Two times a day (BID) | ORAL | 6 refills | Status: DC
  Filled 2021-08-22 (×2): qty 60, 30d supply, fill #0

## 2021-08-22 MED ORDER — POTASSIUM CHLORIDE CRYS ER 20 MEQ PO TBCR
80.0000 meq | EXTENDED_RELEASE_TABLET | Freq: Two times a day (BID) | ORAL | 3 refills | Status: DC
  Filled 2021-08-22: qty 248, 30d supply, fill #0
  Filled 2021-08-22: qty 260, fill #0

## 2021-08-22 NOTE — Progress Notes (Signed)
Patient presents for 3 week f/u in [facility] today alone.  ? ?Patient reports he has been "sick" this week. Says sore throat, diarrhea, cough, head congestion started Monday of this week. He went to urgent care yesterday and says he tested negative for Covid and flu. He reports he was told it was an URI with no medications prescribed.  ? ?Weight down today, patient reports he hasn't been eating "much" since Monday except "chicken noodle soup".  ? ?Patient reports he had to go out of town last week to help his family and he is "short on funds" this week. He ran out of Digoxin and Arlyce Harman, but says he is taking "everything else". He confirms he increased his Entresto to 97-103 twice daily. He is taking torsemide 40 mg twice daily and metolazone 2.5 mg on Wednesday along with 2 extra potassium tablets. Patient confirms he took am medication this am (except Dig and Arlyce Harman) ? ?Dr. Haroldine Laws instructed him to fill Arlyce Harman and Dig and re-start today. Contacted [facility] and both these have zero co-pay. He will pick up today. ? ?Dr. Haroldine Laws ordered Corlanor 5 mg twice daily for elevated HR.  Audry Riles, PharmD obtained prior auth, co-pay card given to patient, his out of pocket expense today will be 20.00. Blima Singer, LCSW in to discuss financial assistance with patient.  ? ?Patient admits he is still drinking > 2L per day and continues to eat available food (fast food). He is still not weighing daily as requested.  Still smoking < 1 pack of cigarettes per day.  ? ?Patient will transition to HF clinic next visit with echo per Dr. Haroldine Laws.  ? ?Vital Signs:  ?Temp: 98.1 ?HR: 115 ?BP: 133/96 (106) ?SPO2: 95% on RA ?  ?Weight: 46.4 lb lb w/o eqt ?Last weight:  354.4 lb ?Home weights:  does not weigh at home ? ? ?Symptom YES NO DETAILS  ?Angina  x Activity:  ?Claudication  x How Far:  ?Syncope  x When:  ?Stroke  x   ?Orthopnea   x  How many pillows: 2 - 4 pillows nightly   ?PND  x How often:  ?CPAP  x How many hours:  has 2nd sleep study 09/29/21  ?Pedal Edema  x   ?Abdominal Fullness  x   ?Nausea / Vomit  x   ?Diaphoresis   x  When:  chronic  ?Shortness of Breath  x Activity:   ?Palpitations  x When:   ?ICD shock   N/A  ?Hospitalizations  x   ?Emergency Room   x  08/21/21 Urgent Care w/ sore throat, cough, diarrhea, head congestion; dx URI with no meds prescribed  ?Other MD  x   ?Activity Works four 10 hr days   ?Fluid Drinking > 2 liters per day  ?Diet Regular diet. Eats out a lot. Does not add salt to food  ? ? ?Patient Instructions: ?Refill your Digoxin and Arlyce Harman and re-start today. Called OP pharmacy, should be zero out of pocket for these two. ?Start Corlanor 5 mg twice daily. Audry Riles, PharmD obtained authorization. Gave patient Co-pay card to assist with patient amount out of pocket. [name], LCSW in to discuss financial assistance with patient. ?Return to [facility] in 1 month with echo.  ?  ?[name] RN ?VAD Coordinator  ?Office: [phone]  ?24/7 Pager: [phone]  ? ?

## 2021-08-22 NOTE — Telephone Encounter (Signed)
Advanced Heart Failure Patient Advocate Encounter ? ?Prior Authorization for Corlanor has been approved.   ? ?PA# [id number] ?Effective dates: 08/22/21 through 08/23/22 ? ?Patients co-pay is $100.00. Patient was provided with a copay card to reduce the cost to $20.00/month.  ? ? ?Karle Plumber, PharmD, BCPS, BCCP, CPP ?Heart Failure Clinic Pharmacist ?[phone] ? ? ?

## 2021-08-22 NOTE — Telephone Encounter (Signed)
Patient Advocate Encounter ?  ?Received notification from [employer] that prior authorization for Corlanor is required. ?  ?PA submitted on CoverMyMeds ?Key [id number]  ?Status is pending ?  ?Will continue to follow. ? ? ?Karle Plumber, PharmD, BCPS, BCCP, CPP ?Heart Failure Clinic Pharmacist ?[phone] ? ?

## 2021-08-22 NOTE — Telephone Encounter (Signed)
Called patient per Dr. Haroldine Laws. Potassium level low on today's lab work. Confirmed patient is currently taking potassium 40 meq twice daily with additional 40 meq on Wednesdays with metolazone. Pt is to take 160 meq potassium today (total of 8 tablets) and increase his daily dose to 80 mg twice daily (4 tabs twice daily). He will need to repeat lab on Monday. Pt verbalized agreement to same.  ? ?[name] RN, VAD Coordinator ?[phone] ?

## 2021-08-22 NOTE — Patient Instructions (Signed)
Refill your Digoxin and Cleda Daub and re-start today. Called OP pharmacy, should be zero out of pocket for these two. ?Start Corlanor 5 mg twice daily. Karle Plumber, PharmD obtained authorization. Gave patient Co-pay card to assist with patient amount out of pocket. Lasandra Beech, LCSW in to discuss financial assistance with patient. ?Return to [facility] Clinic in 1 month with echo.  ? ?

## 2021-08-22 NOTE — Progress Notes (Signed)
Patient shared with VAD coordinator some financial concerns and CSW was referred to assist patient. He shared that recently he had to travel to visit his father who was hospitalized and patient needed to assist with care of his grandmother. Patient on a limited income and struggling to make ends meet with a reduced income and added travel expenses. CSW assisted with visa gift card to assist with payment towards medication co pay and gas needs. Patient signed acknowledgement form and grateful for the assistance. Lasandra Beech, LCSW, CCSW-MCS [phone] ? ?

## 2021-08-25 ENCOUNTER — Other Ambulatory Visit (HOSPITAL_COMMUNITY): Payer: Self-pay | Admitting: *Deleted

## 2021-08-25 ENCOUNTER — Other Ambulatory Visit (HOSPITAL_COMMUNITY): Payer: BC Managed Care – PPO

## 2021-08-25 ENCOUNTER — Encounter (HOSPITAL_COMMUNITY): Payer: BC Managed Care – PPO | Admitting: Internal Medicine

## 2021-08-25 DIAGNOSIS — I5022 Chronic systolic (congestive) heart failure: Secondary | ICD-10-CM

## 2021-08-26 ENCOUNTER — Telehealth (HOSPITAL_COMMUNITY): Payer: Self-pay | Admitting: *Deleted

## 2021-08-26 NOTE — Telephone Encounter (Signed)
Called and spoke with patient regarding missed lab appt yesterday. He states he did not get out of work until 5:45 pm yesterday. Rescheduled lab appt for Thursday 08/28/21 at 2:00 pm so pt can come on the way to work.  ? ?Alyce Pagan RN ?VAD Coordinator  ?Office: [phone]  ?24/7 Pager: [phone]  ? ?

## 2021-08-28 ENCOUNTER — Ambulatory Visit (HOSPITAL_COMMUNITY)
Admission: RE | Admit: 2021-08-28 | Discharge: 2021-08-28 | Disposition: A | Discharge status: Home / Self Care | Payer: BC Managed Care – PPO | Source: Ambulatory Visit | Attending: Cardiology | Admitting: Cardiology

## 2021-08-28 DIAGNOSIS — E876 Hypokalemia: Secondary | ICD-10-CM | POA: Diagnosis not present

## 2021-08-28 DIAGNOSIS — I5023 Acute on chronic systolic (congestive) heart failure: Secondary | ICD-10-CM | POA: Diagnosis not present

## 2021-08-28 LAB — BASIC METABOLIC PANEL
Anion gap: 7 (ref 5–15)
BUN: 12 mg/dL (ref 6–20)
CO2: 26 mmol/L (ref 22–32)
Calcium: 8.9 mg/dL (ref 8.9–10.3)
Chloride: 106 mmol/L (ref 98–111)
Creatinine, Ser: 1.06 mg/dL (ref 0.61–1.24)
GFR, Estimated: >60 mL/min (ref >60)
Glucose, Bld: 154 mg/dL — ABNORMAL HIGH (ref 70–99)
Potassium: 4.2 mmol/L (ref 3.5–5.1)
Sodium: 139 mmol/L (ref 135–145)

## 2021-09-02 ENCOUNTER — Other Ambulatory Visit (HOSPITAL_COMMUNITY): Payer: Self-pay

## 2021-09-11 ENCOUNTER — Other Ambulatory Visit (HOSPITAL_COMMUNITY): Payer: Self-pay

## 2021-09-23 ENCOUNTER — Other Ambulatory Visit (HOSPITAL_COMMUNITY): Payer: Self-pay

## 2021-09-23 NOTE — Progress Notes (Signed)
? ?ADVANCED HF CLINIC NOTE ? ?Primary Care: Pcp, No ?Primary Cardiologist: Olga Millers, MD ? ?HPI: ? ?Aviel Davalos is a morbidly obese 36 y.o. male with HTN, previous IVDA (heroin), tobacco use and systolic HF (onset 9/21) referred by Dr. Jens Som for further management of his HF.  ? ?Echo 10/20 EF 60-65%  ? ?Admitted to [facility] 9/21 with new onset HF in setting of severe HTN 174/128.Marland Kitchen ECG with sinus tach and frequent PVCs.  ? ?Echo 02/16/20: EF 20-25% Moderate RV dysfunction.  ? ?I saw him in HF Consultation for the first time 04/24/20. 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 Lasix and Entresto increased. Digoxin added. Zio placed to quantify PVC burden - 4.3%. ? ?Admitted 12/22 with A/C CHF exacerbation. Echo LVEF <20% w/ global HK, no visible thrombus, + mild MR, RV normal. R/LHC was recommended after diuresis to r/o CAD however pt requested for cath to be completed as outpatient. ? ?Underwent cath 06/12/21 with normal cors. EF < 10% Elevated filling pressures (PCWP 28) and low output (CI 1.5). Lasix increased to 80 bid. ? ?Seen recently for worsening HF/volume overload. Treated with IV lasix and lasix switch to torsemide. ? ?Here for HF f/u. Says he has been sick this week with sore throat, diarrhea, cough, head congestion started Monday of this week. He went to urgent care yesterday and says he tested negative for Covid and flu. He reports he was told it was an URI with no medications prescribed. Starting to feel a bit better. Weight down 8 pounds.  ? ?Mild SOB with activity. Denies edema, orthopnea of PND. Says he had to go out of town last week to help his family and he is "short on funds" this week. He is out of dig and spiro but reports taking everything else. Remains tachy  ? ?Still drinking > 2L per day and continues to eat available food (fast food). Still smoking < 1 pack of cigarettes per day.  ?  ?CPX test 07/18/21 ?FVC 5.12  (92%)      ?FEV1 4.34  (96%)        ?FEV1/FVC 85   (103%)        ?MVV 164  (93%)   ? ?Resting HR: 126 Standing HR:125 Peak HR: 175   (95% age predicted max HR)  ?BP rest (standing): 92/70 BP peak: 148/72  ?Peak VO2: 21.6 (87% predicted peak VO2)  ?When adjusted to the patient's ideal body weight of 180.3 lb (81.8 kg) the peak VO2 is 41.6 ml/kg (ibw)/min (99% of the ibw-adjusted predicted).  ?VE/VCO2 slope:  45  ?VE/MVV:  92%  ?O2pulse:  19 (90% predicted O2pulse)  ? ? ?RHC 1/23 ?Ao = 91/72 (81) ?LV = 108/37 ?RA = 9 ?RV = 51/15 ?PA = 66/38 (50) ?PCW = 28 ?Fick cardiac output/index = 3.9/1.5 ?PVR = 5.7 ?SVR = 1,484 ?Ao sat = 98% ?PA sat = 55%, 54% ?PAPI = 3.1 ? ?Zio 12/21 ?Sinus - average HR 104 ?4.3% PVCs ? ? ? ?Past Medical History:  ?Diagnosis Date  ? CHF (congestive heart failure) (HCC)   ? Hypertension   ? Obesity   ? Snoring   ? ? ?Current Outpatient Medications  ?Medication Sig Dispense Refill  ? dapagliflozin propanediol (FARXIGA) 10 MG TABS tablet Take 1 tablet (10 mg total) by mouth daily before breakfast. 90 tablet 3  ? ivabradine (CORLANOR) 5 MG TABS tablet Take 1 tablet (5 mg total) by mouth 2 (two) times daily with a meal. 60 tablet 6  ?  metolazone (ZAROXOLYN) 2.5 MG tablet Take 1 tablet (2.5 mg total) by mouth once a week on Wednesdays with 2 extra tablets of potassium 30 tablet 1  ? sacubitril-valsartan (ENTRESTO) 97-103 MG Take 1 tablet by mouth 2 (two) times daily. 180 tablet 3  ? torsemide (DEMADEX) 20 MG tablet Take 2 tablets (40 mg total) by mouth 2 (two) times daily. 360 tablet 3  ? digoxin (LANOXIN) 0.125 MG tablet Take 1 tablet (0.125 mg total) by mouth daily. (Patient not taking: Reported on 08/22/2021) 90 tablet 3  ? potassium chloride SA (KLOR-CON M) 20 MEQ tablet Take 4 tablets (80 mEq total) by mouth 2 (two) times daily and 2 tablets ( ) as directed with metalazone on wednesdays. 260 tablet 3  ? spironolactone (ALDACTONE) 25 MG tablet Take 1 tablet (25 mg total) by mouth daily. (Patient not taking: Reported on 08/22/2021) 90 tablet 3  ? ?No current  facility-administered medications for this encounter.  ? ? ?No Known Allergies ? ?  ?Social History  ? ?Socioeconomic History  ? Marital status: Single  ?  Spouse name: Not on file  ? Number of children: Not on file  ? Years of education: Not on file  ? Highest education level: Not on file  ?Occupational History  ? Not on file  ?Tobacco Use  ? Smoking status: Every Day  ?  Packs/day: 1.00  ?  Years: 16.00  ?  Pack years: 16.00  ?  Types: Cigarettes  ? Smokeless tobacco: Never  ?Vaping Use  ? Vaping Use: Some days  ? Substances: Nicotine  ?Substance and Sexual Activity  ? Alcohol use: Not Currently  ?  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 Drug use: Not Currently  ?  Comment: Pt stated "It was opiates"  ? Sexual activity: Not on file  ?Other Topics Concern  ? Not on file  ?Social History Narrative  ? Not on file  ? ?Social Determinants of Health  ? ?Financial Resource Strain: Low Risk   ? Difficulty of Paying Living Expenses: Not very hard  ?Food Insecurity: No Food Insecurity  ? Worried About Programme researcher, broadcasting/film/video in the Last Year: Never true  ? Ran Out of Food in the Last Year: Never true  ?Transportation Needs: No Transportation Needs  ? Lack of Transportation (Medical): No  ? Lack of Transportation (Non-Medical): No  ?Physical Activity: Not on file  ?Stress: Not on file  ?Social Connections: Not on file  ?Intimate Partner Violence: Not on file  ? ? ?  ?Family History  ?Problem Relation Age of Onset  ? Hypertension Mother   ? Hypertension Father   ? ? ?Vitals:  ? 08/22/21 1125  ?BP: (!) 133/96  ?Pulse: (!) 115  ?Temp: 98.1 ?F (36.7 ?C)  ?TempSrc: Oral  ?SpO2: 98%  ?Weight: (!) 157.1 kg (346 lb 6.4 oz)  ?Height: 5\' 11"  (1.803 m)  ? ?Wt Readings from Last 3 Encounters:  ?08/22/21 (!) 157.1 kg (346 lb 6.4 oz)  ?08/01/21 (!) 160.8 kg (354 lb 6.4 oz)  ?07/09/21 (!) 158.6 kg (349 lb 9.6 oz)  ?  ?PHYSICAL EXAM: ?General:  Obese male  No resp difficulty ?HEENT: normal ?Neck: supple. no JVD. Carotids 2+ bilat; no bruits. No  lymphadenopathy or thryomegaly appreciated. ?Cor: Tachy + s3. ?Lungs: clear ?Abdomen: obese soft, nontender, nondistended. No hepatosplenomegaly. No bruits or masses. Good bowel sounds. ?Extremities: no cyanosis, clubbing, rash, edema warm ?Neuro: alert & orientedx3, cranial nerves grossly intact. moves all 4 extremities  w/o difficulty. Affect pleasant ? ?ASSESSMENT & PLAN: ? ?1. Chronic systolic HF ?- diagnosed 9/21 Echo EF 20-25% RV moderately HK ?- suspect HTN vs PVC-mediated (PVC burden 4.3% - probably not high enough to cause CM) ?- cath 1/23 no CAD EF < 10% ?- CPX 2/23 pVO2: 21.6 (87% predicted peak VO2) corrected for ibw pVO2 41.6 ml/kg (ibw)/min (99% of the ibw-adjusted predicted). Slope:45 O2pulse:  19 (90% pred) ?- Improving slowly but still quite tachycardic ?- Volume status improved. Has had poor po intake duet o recent virus ?- Continue Farxiga 10 ?- Continue Entresto 49/51 ?- Restart digoxin 0.125 ?- Restart spiro 12.5 ?- Off carvedilol due to low output, Will start ivabradine 5 bid and see if better tolerated. ?- I remained concerned about possible need for advanced therapies but CPX test shows relatively well preserved functional capacity but slope is quite high. Continue to follow closely ?- Body mass index is 48.31 kg/m?. ?- Not transplant candidate due to size but would be VAD candidate likely ? ?2. HTN, severe ?- BP ok  ? ?3. Frequent PVCs ?- Zio 12/21 4.3% PVCs - probably not high enough burden to cause CM  ?- needs sleep study - does not have insurance to cover ? ?4. IVDA (heroin) ?- reports no use since 12/20 ? ?5. Morbid obesity ?- needs weight loss and sleep study ?- consider GLP1RA ? ?Total time spent 40 minutes. Over half that time spent discussing above.  ? ?Arvilla Meres, MD  ?11:58 PM ? ? ?

## 2021-09-24 ENCOUNTER — Other Ambulatory Visit (HOSPITAL_COMMUNITY): Payer: Self-pay

## 2021-09-24 ENCOUNTER — Ambulatory Visit (HOSPITAL_COMMUNITY)
Admission: RE | Admit: 2021-09-24 | Discharge: 2021-09-24 | Disposition: A | Discharge status: Home / Self Care | Payer: BC Managed Care – PPO | Source: Ambulatory Visit | Attending: Internal Medicine | Admitting: Internal Medicine

## 2021-09-24 ENCOUNTER — Encounter (HOSPITAL_COMMUNITY): Payer: Self-pay | Admitting: Internal Medicine

## 2021-09-24 ENCOUNTER — Telehealth (HOSPITAL_COMMUNITY): Payer: Self-pay | Admitting: *Deleted

## 2021-09-24 ENCOUNTER — Ambulatory Visit (HOSPITAL_BASED_OUTPATIENT_CLINIC_OR_DEPARTMENT_OTHER)
Admission: RE | Admit: 2021-09-24 | Discharge: 2021-09-24 | Disposition: A | Discharge status: Home / Self Care | Payer: BC Managed Care – PPO | Source: Ambulatory Visit | Attending: Internal Medicine | Admitting: Internal Medicine

## 2021-09-24 VITALS — BP 104/70 | HR 101 | Wt 356.6 lb

## 2021-09-24 DIAGNOSIS — I110 Hypertensive heart disease with heart failure: Secondary | ICD-10-CM | POA: Insufficient documentation

## 2021-09-24 DIAGNOSIS — I509 Heart failure, unspecified: Secondary | ICD-10-CM | POA: Diagnosis not present

## 2021-09-24 DIAGNOSIS — Z79899 Other long term (current) drug therapy: Secondary | ICD-10-CM | POA: Insufficient documentation

## 2021-09-24 DIAGNOSIS — I10 Essential (primary) hypertension: Secondary | ICD-10-CM

## 2021-09-24 DIAGNOSIS — E876 Hypokalemia: Secondary | ICD-10-CM

## 2021-09-24 DIAGNOSIS — Z7984 Long term (current) use of oral hypoglycemic drugs: Secondary | ICD-10-CM | POA: Diagnosis not present

## 2021-09-24 DIAGNOSIS — F1121 Opioid dependence, in remission: Secondary | ICD-10-CM | POA: Diagnosis not present

## 2021-09-24 DIAGNOSIS — I5023 Acute on chronic systolic (congestive) heart failure: Secondary | ICD-10-CM

## 2021-09-24 DIAGNOSIS — I5022 Chronic systolic (congestive) heart failure: Secondary | ICD-10-CM

## 2021-09-24 DIAGNOSIS — R000 Tachycardia, unspecified: Secondary | ICD-10-CM | POA: Diagnosis not present

## 2021-09-24 DIAGNOSIS — I493 Ventricular premature depolarization: Secondary | ICD-10-CM | POA: Diagnosis not present

## 2021-09-24 DIAGNOSIS — Z6841 Body Mass Index (BMI) 40.0 and over, adult: Secondary | ICD-10-CM | POA: Diagnosis not present

## 2021-09-24 LAB — BASIC METABOLIC PANEL
Anion gap: 11 (ref 5–15)
BUN: 14 mg/dL (ref 6–20)
CO2: 29 mmol/L (ref 22–32)
Calcium: 9.3 mg/dL (ref 8.9–10.3)
Chloride: 99 mmol/L (ref 98–111)
Creatinine, Ser: 1.07 mg/dL (ref 0.61–1.24)
GFR, Estimated: >60 mL/min (ref >60)
Glucose, Bld: 95 mg/dL (ref 70–99)
Potassium: 2.9 mmol/L — ABNORMAL LOW (ref 3.5–5.1)
Sodium: 139 mmol/L (ref 135–145)

## 2021-09-24 LAB — BRAIN NATRIURETIC PEPTIDE: B Natriuretic Peptide: 70.2 pg/mL (ref 0.0–100.0)

## 2021-09-24 LAB — ECHOCARDIOGRAM COMPLETE
Area-P 1/2: 3.68 cm2
Calc EF: 18.5 %
S' Lateral: 5.9 cm
Single Plane A2C EF: 23.7 %
Single Plane A4C EF: 21.2 %

## 2021-09-24 LAB — DIGOXIN LEVEL: Digoxin Level: 0.4 ng/mL — ABNORMAL LOW (ref 0.8–2.0)

## 2021-09-24 MED ORDER — IVABRADINE HCL 5 MG PO TABS
7.5000 mg | ORAL_TABLET | Freq: Two times a day (BID) | ORAL | 6 refills | Status: DC
  Filled 2021-09-24: qty 60, 20d supply, fill #0

## 2021-09-24 MED ORDER — PERFLUTREN LIPID MICROSPHERE
1.0000 mL | INTRAVENOUS | Status: DC | PRN
  Administered 2021-09-24: 2 mL via INTRAVENOUS
  Filled 2021-09-24: qty 10

## 2021-09-24 MED ORDER — IVABRADINE HCL 7.5 MG PO TABS
7.5000 mg | ORAL_TABLET | Freq: Two times a day (BID) | ORAL | 6 refills | Status: DC
  Filled 2021-09-24 – 2021-12-01 (×2): qty 60, 30d supply, fill #0
  Filled 2021-12-31 – 2022-03-10 (×3): qty 60, 30d supply, fill #1

## 2021-09-24 MED ORDER — POTASSIUM CHLORIDE CRYS ER 20 MEQ PO TBCR: 80.0000 meq | EXTENDED_RELEASE_TABLET | Freq: Every day | ORAL | 3 refills | Status: DC

## 2021-09-24 NOTE — Addendum Note (Signed)
Encounter addended by: Dolores Patty, MD on: 09/24/2021 12:08 AM ? Actions taken: Clinical Note Signed, Level of Service modified, Visit diagnoses modified

## 2021-09-24 NOTE — Progress Notes (Addendum)
? ?ADVANCED HF CLINIC NOTE ? ?Primary Care: Pcp, No ?Primary Cardiologist: Kirk Ruths, MD ? ?HPI: ? ?Donald Murray is a morbidly obese 36 y.o. male with HTN, previous IVDA (heroin), tobacco use and systolic HF (onset 0000000) referred by Dr. [name] for further management of his HF.  ? ?Echo 10/20 EF 60-65%  ? ?Admitted to [facility] 9/21 with new onset HF in setting of severe HTN 174/128.Marland Kitchen ECG with sinus tach and frequent PVCs.  ? ?Echo 02/16/20: EF 20-25% Moderate RV dysfunction.  ? ?I saw him in HF Consultation for the first time 04/24/20. 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 Lasix and Entresto increased. Digoxin added. Zio placed to quantify PVC burden - 4.3%. ? ?Admitted 12/22 with A/C CHF exacerbation. Echo LVEF <20% w/ global HK, no visible thrombus, + mild MR, RV normal. R/LHC was recommended after diuresis to r/o CAD however pt requested for cath to be completed as outpatient. ? ?Underwent cath 06/12/21 with normal cors. EF < 10% Elevated filling pressures (PCWP 28) and low output (CI 1.5). Lasix increased to 80 bid. ? ?Here for HF f/u. Doing ok. Still working FT at [employer]. Still SOB with exertion. No problems with meds.  ? ?Echo today 09/24/21: EF 20-25% RV moderately HK Personally reviewed ? ?Cardiac studies: ?  ?CPX test 07/18/21 ?FVC 5.12  (92%)      ?FEV1 4.34  (96%)        ?FEV1/FVC 85   (103%)        ?MVV 164  (93%)  ? ?Resting HR: 126 Standing HR:125 Peak HR: 175   (95% age predicted max HR)  ?BP rest (standing): 92/70 BP peak: 148/72  ?Peak VO2: 21.6 (87% predicted peak VO2)  ?When adjusted to the patient's ideal body weight of 180.3 lb (81.8 kg) the peak VO2 is 41.6 ml/kg (ibw)/min (99% of the ibw-adjusted predicted).  ?VE/VCO2 slope:  45  ?VE/MVV:  92%  ?O2pulse:  19 (90% predicted O2pulse)  ? ? ?[place] 1/23 ?Ao = 91/72 (81) ?LV = 108/37 ?RA = 9 ?RV = 51/15 ?PA = 66/38 (50) ?PCW = 28 ?Fick cardiac output/index = 3.9/1.5 ?PVR = 5.7 ?SVR = 1,484 ?Ao sat = 98% ?PA sat = 55%, 54% ?PAPI = 3.1 ? ?Zio  12/21 ?Sinus - average HR 104 ?4.3% PVCs ? ? ? ?Past Medical History:  ?Diagnosis Date  ? CHF (congestive heart failure) ([place])   ? Hypertension   ? Obesity   ? Snoring   ? ? ?Current Outpatient Medications  ?Medication Sig Dispense Refill  ? dapagliflozin propanediol (FARXIGA) 10 MG TABS tablet Take 1 tablet (10 mg total) by mouth daily before breakfast. 90 tablet 3  ? digoxin (LANOXIN) 0.125 MG tablet Take 1 tablet (0.125 mg total) by mouth daily. 90 tablet 3  ? ivabradine (CORLANOR) 5 MG TABS tablet Take 1 tablet (5 mg total) by mouth 2 (two) times daily with a meal. 60 tablet 6  ? metolazone (ZAROXOLYN) 2.5 MG tablet Take 1 tablet (2.5 mg total) by mouth once a week on Wednesdays with 2 extra tablets of potassium 30 tablet 1  ? potassium chloride SA (KLOR-CON M) 20 MEQ tablet Take 4 tablets (80 mEq total) by mouth 2 (two) times daily and 2 tablets (92meq) as directed with metalazone on wednesdays. 260 tablet 3  ? sacubitril-valsartan (ENTRESTO) 97-103 MG Take 1 tablet by mouth 2 (two) times daily. 180 tablet 3  ? spironolactone (ALDACTONE) 25 MG tablet Take 1 tablet (25 mg total) by mouth daily.  90 tablet 3  ? torsemide (DEMADEX) 20 MG tablet Take 2 tablets (40 mg total) by mouth 2 (two) times daily. 360 tablet 3  ? ?No current facility-administered medications for this encounter.  ? ?Facility-Administered Medications Ordered in Other Encounters  ?Medication Dose Route Frequency Provider Last Rate Last Admin  ? perflutren lipid microspheres (DEFINITY) IV suspension  1-10 mL Intravenous PRN Mackenzi Krogh, Shaune Pascal, MD   2 mL at 09/24/21 0906  ? ? ?No Known Allergies ? ?  ?Social History  ? ?Socioeconomic History  ? Marital status: Single  ?  Spouse name: Not on file  ? Number of children: Not on file  ? Years of education: Not on file  ? Highest education level: Not on file  ?Occupational History  ? Not on file  ?Tobacco Use  ? Smoking status: Every Day  ?  Packs/day: 1.00  ?  Years: 16.00  ?  Pack years: 16.00  ?   Types: Cigarettes  ? Smokeless tobacco: Never  ?Vaping Use  ? Vaping Use: Some days  ? Substances: Nicotine  ?Substance and Sexual Activity  ? Alcohol use: Not Currently  ?  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 Drug use: Not Currently  ?  Comment: Pt stated "It was opiates"  ? Sexual activity: Not on file  ?Other Topics Concern  ? Not on file  ?Social History Narrative  ? Not on file  ? ?Social Determinants of Health  ? ?Financial Resource Strain: Low Risk   ? Difficulty of Paying Living Expenses: Not very hard  ?Food Insecurity: No Food Insecurity  ? Worried About Charity fundraiser in the Last Year: Never true  ? Ran Out of Food in the Last Year: Never true  ?Transportation Needs: No Transportation Needs  ? Lack of Transportation (Medical): No  ? Lack of Transportation (Non-Medical): No  ?Physical Activity: Not on file  ?Stress: Not on file  ?Social Connections: Not on file  ?Intimate Partner Violence: Not on file  ? ? ?  ?Family History  ?Problem Relation Age of Onset  ? Hypertension Mother   ? Hypertension Father   ? ? ?Vitals:  ? 09/24/21 0929  ?BP: 104/70  ?Pulse: (!) 101  ?SpO2: 93%  ?Weight: (!) 161.8 kg (356 lb 9.6 oz)  ? ?Wt Readings from Last 3 Encounters:  ?09/24/21 (!) 161.8 kg (356 lb 9.6 oz)  ?08/22/21 (!) 157.1 kg (346 lb 6.4 oz)  ?08/01/21 (!) 160.8 kg (354 lb 6.4 oz)  ?  ?PHYSICAL EXAM: ?General: Obese male No resp difficulty ?HEENT: normal ?Neck: supple. no JVD. Carotids 2+ bilat; no bruits. No lymphadenopathy or thryomegaly appreciated. ?Cor: PMI nondisplaced. Tachy regular No rubs, gallops or murmurs. ?Lungs: clear ?Abdomen: soft, nontender, nondistended. No hepatosplenomegaly. No bruits or masses. Good bowel sounds. ?Extremities: no cyanosis, clubbing, rash, edema ?Neuro: alert & orientedx3, cranial nerves grossly intact. moves all 4 extremities w/o difficulty. Affect pleasant ? ?ECG SR 92 Personally reviewed ? ? ? ?ASSESSMENT & PLAN: ? ?1. Chronic systolic HF ?- diagnosed 0000000 Echo EF 20-25%  RV moderately HK ?- suspect HTN vs PVC-mediated (PVC burden 4.3% - probably not high enough to cause CM) ?- cath 1/23 no CAD EF < 10% ?- CPX 2/23 pVO2: 21.6 (87% predicted peak VO2) corrected for ibw pVO2 41.6 ml/kg (ibw)/min (99% of the ibw-adjusted predicted). Slope:45 O2pulse:  19 (90% pred) ?- Echo today 09/24/21: EF 20-25% RV moderately HK Personally reviewed ?- Stable NYHA II-III. CPX test reassuring  ?-  Volume status ok ?- Continue Farxiga 10 ?- Continue Entresto 49/51 ?- Continue digoxin 0.125 ?- Continue spiro 12.5 ?- Off carvedilol due to low output ?- Increase ivabradine 7.5 bid a ?- I remained concerned about possible need for advanced therapies but CPX test shows relatively well preserved functional capacity but slope is quite high. Continue to follow closely ?- Body mass index is 49.74 kg/m?. ?- Not transplant candidate due to size but would be VAD candidate likely ?- Refer to EP for ICD ?- Labs today ? ?2. HTN, severe ?- BP ok ? ?3. Frequent PVCs ?- Zio 12/21 4.3% PVCs - probably not high enough burden to cause CM  ?- needs sleep study - does not have insurance to cover ? ?4. IVDA (heroin) ?- reports no use since 12/20 ? ?5. Morbid obesity ?- needs weight loss and sleep study ?- consider GLP1RA ? ? ?Glori Bickers, MD  ?9:46 AM ? ? ?

## 2021-09-24 NOTE — Patient Instructions (Signed)
Good to see you today! ? ?INCREASE Ivabradine to 7.5 mg ( 1 1/2 tablets)Twice daily ? ?Labs done today, your results will be available in MyChart, we will contact you for abnormal readings ?  ?Your physician recommends that you schedule a follow-up appointment in: Electrophysiology for ICD evaluation ? ?If you have any questions or concerns before your next appointment please send [place] a message through [facility] or call our office at [phone].   ? ?TO LEAVE A MESSAGE FOR THE NURSE SELECT OPTION 2, PLEASE LEAVE A MESSAGE INCLUDING: ?YOUR NAME ?DATE OF BIRTH ?CALL BACK NUMBER ?REASON FOR CALL**this is important as we prioritize the call backs ? ?YOU WILL RECEIVE A CALL BACK THE SAME DAY AS LONG AS YOU CALL BEFORE 4:00 PM ? ?At the [facility], you and your health needs are our priority. As part of our continuing mission to provide you with exceptional heart care, we have created designated Provider Care Teams. These Care Teams include your primary Cardiologist (physician) and Advanced Practice Providers (APPs- Physician Assistants and Nurse Practitioners) who all work together to provide you with the care you need, when you need it.  ? ?You may see any of the following providers on your designated Care Team at your next follow up: ?Dr Glori Bickers ?Dr Loralie Champagne ?Darrick Grinder, NP ?Lyda Jester, PA ?Jessica Milford,NP ?Marlyce Huge, PA ?Audry Riles, PharmD ? ? ?Please be sure to bring in all your medications bottles to every appointment.  ? ? ? ?

## 2021-09-24 NOTE — Telephone Encounter (Signed)
Pt aware, he states he has been taking KCL 80 meq daily however he was out of town and did not take any Fri, Sat or Sun. Per Dr Gala Romney take extra 40 meq now, take total of 120 meq tomorrow, then resume 80 meq Daily. Pt aware, agreeable, and verbalized understanding, repeat lab sch for 5/12 ?

## 2021-09-29 ENCOUNTER — Ambulatory Visit (HOSPITAL_BASED_OUTPATIENT_CLINIC_OR_DEPARTMENT_OTHER): Payer: BC Managed Care – PPO | Attending: Cardiology | Admitting: Cardiology

## 2021-09-29 DIAGNOSIS — G4733 Obstructive sleep apnea (adult) (pediatric): Secondary | ICD-10-CM | POA: Insufficient documentation

## 2021-09-30 NOTE — Procedures (Signed)
? ?  Patient Name: Donald Murray, Donald Murray ?Study Date: 09/29/2021 ?Gender: Male ?D.O.B: 1986-05-12 ?Age (years): 43 ?Referring Provider: Fransico Him MD, ABSM ?Height (inches): 71 ?Interpreting Physician: Fransico Him MD, ABSM ?Weight (lbs): 350 ?RPSGT: Carolin Coy ?BMI: 49 ?MRN: [id number] ?Neck Size: 19.50 ? ?CLINICAL INFORMATION ?The patient is referred for a BiPAP titration to treat sleep apnea. ? ?SLEEP STUDY TECHNIQUE ?As per the AASM Manual for the Scoring of Sleep and Associated Events v2.3 (April 2016) with a hypopnea requiring 4% desaturations. ? ?The channels recorded and monitored were frontal, central and occipital EEG, electrooculogram (EOG), submentalis EMG (chin), nasal and oral airflow, thoracic and abdominal wall motion, anterior tibialis EMG, snore microphone, electrocardiogram, and pulse oximetry. Bilevel positive airway pressure (BPAP) was initiated at the beginning of the study and titrated to treat sleep-disordered breathing. ? ?MEDICATIONS ?Medications self-administered by patient taken the night of the study : N/A ? ?RESPIRATORY PARAMETERS ?Optimal IPAP Pressure (cm): 25  ?AHI at Optimal Pressure (/hr) 0 ?Optimal EPAP Pressure (cm):21  ?Overall Minimal O2 (%):87.0  ?Minimal O2 at Optimal Pressure (%): 92.0 ? ?SLEEP ARCHITECTURE ?Start Time:10:18:15 PM  ?Stop Time:4:49:59 AM  ?Total Time (min):391.7  ?Total Sleep Time (min):267 ?Sleep Latency (min):55.8  ?Sleep Efficiency (%):68.2%  ?REM Latency (min):163.0  ?WASO (min):68.9 ?Stage N1 (%): 20.6%  ?Stage N2 (%): 65.4%  ?Stage N3 (%): 0.0%  ?Stage R (%):14 ?Supine (%):52.62  ?Arousal Index (/hr):45.2  ? ?CARDIAC DATA ?The 2 lead EKG demonstrated sinus rhythm. The mean heart rate was 92.9 beats per minute. Other EKG findings include: PVCs. ? ?LEG MOVEMENT DATA ?The total Periodic Limb Movements of Sleep (PLMS) were 0. The PLMS index was 0.0. A PLMS index of <15 is considered normal in adults. ? ?IMPRESSIONS ?- An optimal PAP pressure was selected for  this patient ( 25 / cm of water) ?- Central sleep apnea was not noted during this titration (CAI = 0.2/h). ?- Mild oxygen desaturations were observed during this titration (min O2 = 87.0%). ?- The patient snored with moderate snoring volume. ?- 2-lead EKG demonstrated: PVCs ?- Clinically significant periodic limb movements were not noted during this study. Arousals associated with PLMs were rare. ? ?DIAGNOSIS ?- Obstructive Sleep Apnea (G47.33) ? ?RECOMMENDATIONS ?- Trial of auto BiPAP therapy with IPAP max 20cm H2O, EPAP min 5cm H2O and PS 4cm H2O  with a Medium size Fisher[employer]Paykel Full Face Vitera mask and heated humidification. ?- Avoid alcohol, sedatives and other CNS depressants that may worsen sleep apnea and disrupt normal sleep architecture. ?- Sleep hygiene should be reviewed to assess factors that may improve sleep quality. ?- Weight management and regular exercise should be initiated or continued. ?- Return to Sleep Center for re-evaluation after 6 weeks of therapy ? ?[Electronically signed] 09/30/2021 02:10 PM ? ?Fransico Him MD, ABSM ?Diplomate, Tax adviser of Sleep Medicine ?

## 2021-10-02 ENCOUNTER — Other Ambulatory Visit (HOSPITAL_COMMUNITY): Payer: Self-pay

## 2021-10-03 ENCOUNTER — Other Ambulatory Visit (HOSPITAL_COMMUNITY): Payer: BC Managed Care – PPO

## 2021-10-15 ENCOUNTER — Telehealth: Payer: Self-pay | Admitting: *Deleted

## 2021-10-15 NOTE — Telephone Encounter (Signed)
-----   Message from Lauralee Evener, Concepcion sent at 09/30/2021  2:22 PM EDT -----  ----- Message ----- From: Sueanne Margarita, MD Sent: 09/30/2021   2:12 PM EDT To: Cv Div Sleep Studies  Please let patient know that they had a successful PAP titration and let DME know that orders are in EPIC.  Please set up 6 week OV with me.

## 2021-10-15 NOTE — Telephone Encounter (Signed)
The patient has been notified of the result .  Left message on voicemail and informed patient to call back to go over his results.Marolyn Hammock, [place] 10/15/2021 9:25 AM    Upon patient request DME selection is [facility] Patient understands he will be contacted by [facility] to set up his cpap. Patient understands to call if [facility] does not contact him with new setup in a timely manner. Patient understands they will be called once confirmation has been received from [facility] that they have received their new machine to schedule 10 week follow up appointment.   [facility] notified of new cpap order  Please add to airview Patient was grateful for the call and thanked me

## 2021-10-15 NOTE — Telephone Encounter (Signed)
Return Call: The patient has been notified of the result and verbalized understanding.  All questions (if any) were answered. Latrelle Dodrill, CMA 10/15/2021 10:00 AM

## 2021-10-23 ENCOUNTER — Ambulatory Visit (INDEPENDENT_AMBULATORY_CARE_PROVIDER_SITE_OTHER): Payer: BC Managed Care – PPO

## 2021-10-23 ENCOUNTER — Ambulatory Visit (INDEPENDENT_AMBULATORY_CARE_PROVIDER_SITE_OTHER): Payer: BC Managed Care – PPO | Admitting: Internal Medicine

## 2021-10-23 ENCOUNTER — Other Ambulatory Visit: Payer: Self-pay | Admitting: Internal Medicine

## 2021-10-23 ENCOUNTER — Encounter: Payer: Self-pay | Admitting: Internal Medicine

## 2021-10-23 VITALS — BP 110/70 | HR 103 | Ht 71.0 in | Wt 362.0 lb

## 2021-10-23 DIAGNOSIS — E876 Hypokalemia: Secondary | ICD-10-CM

## 2021-10-23 DIAGNOSIS — I5022 Chronic systolic (congestive) heart failure: Secondary | ICD-10-CM

## 2021-10-23 DIAGNOSIS — R000 Tachycardia, unspecified: Secondary | ICD-10-CM

## 2021-10-23 NOTE — Patient Instructions (Addendum)
Medication Instructions:  Your physician recommends that you continue on your current medications as directed. Please refer to the Current Medication list given to you today.  *If you need a refill on your cardiac medications before your next appointment, please call your pharmacy*   Lab Work:  BMET today If you have labs (blood work) drawn today and your tests are completely normal, you will receive your results only by: [place] (if you have MyChart) OR A paper copy in the mail If you have any lab test that is abnormal or we need to change your treatment, we will call you to review the results.   Testing/Procedures: [place] [place]- Long Term Monitor Instructions  Your physician has requested you wear a ZIO patch monitor for 14 days.  This is a single patch monitor. [employer] supplies one patch monitor per enrollment. Additional stickers are not available. Please do not apply patch if you will be having a Nuclear Stress Test,  Echocardiogram, Cardiac CT, MRI, or Chest Xray during the period you would be wearing the  monitor. The patch cannot be worn during these tests. You cannot remove and re-apply the  ZIO XT patch monitor.  Your ZIO patch monitor will be mailed 3 day USPS to your address on file. It may take 3-5 days  to receive your monitor after you have been enrolled.  Once you have received your monitor, please review the enclosed instructions. Your monitor  has already been registered assigning a specific monitor serial # to you.  Billing and Patient Assistance Program Information  We have supplied [employer] with any of your insurance information on file for billing purposes. [employer] offers a sliding scale Patient Assistance Program for patients that do not have  insurance, or whose insurance does not completely cover the cost of the ZIO monitor.  You must apply for the Patient Assistance Program to qualify for this discounted rate.  To apply, please call [employer] at  [phone], select option 4, select option 2, ask to apply for  Patient Assistance Program. Theodore Demark will ask your household income, and how many people  are in your household. They will quote your out-of-pocket cost based on that information.  [employer] will also be able to set up a 76-month, interest-free payment plan if needed.  Applying the monitor   Shave hair from upper left chest.  Hold abrader disc by orange tab. Rub abrader in 40 strokes over the upper left chest as  indicated in your monitor instructions.  Clean area with 4 enclosed alcohol pads. Let dry.  Apply patch as indicated in monitor instructions. Patch will be placed under collarbone on left  side of chest with arrow pointing upward.  Rub patch adhesive wings for 2 minutes. Remove white label marked "1". Remove the white  label marked "2". Rub patch adhesive wings for 2 additional minutes.  While looking in a mirror, press and release button in center of patch. A small green light will  flash 3-4 times. This will be your only indicator that the monitor has been turned on.  Do not shower for the first 24 hours. You may shower after the first 24 hours.  Press the button if you feel a symptom. You will hear a small click. Record Date, Time and  Symptom in the Patient Logbook.  When you are ready to remove the patch, follow instructions on the last 2 pages of Patient  Logbook. Stick patch monitor onto the last page of Patient Logbook.  Place Patient  Logbook in the blue and white box. Use locking tab on box and tape box closed  securely. The blue and white box has prepaid postage on it. Please place it in the mailbox as  soon as possible. Your physician should have your test results approximately 7 days after the  monitor has been mailed back to [facility].  Call Tees Toh at [phone] if you have questions regarding  your ZIO XT patch monitor. Call them immediately if you see an orange light  blinking on your  monitor.  If your monitor falls off in less than 4 days, contact our Monitor department at [phone].  If your monitor becomes loose or falls off after 4 days call [employer] at [phone] for  suggestions on securing your monitor     Follow-Up: At [facility], you and your health needs are our priority.  As part of our continuing mission to provide you with exceptional heart care, we have created designated Provider Care Teams.  These Care Teams include your primary Cardiologist (physician) and Advanced Practice Providers (APPs -  Physician Assistants and Nurse Practitioners) who all work together to provide you with the care you need, when you need it.  We recommend signing up for the patient portal called "MyChart".  Sign up information is provided on this After Visit Summary.  MyChart is used to connect with patients for Virtual Visits (Telemedicine).  Patients are able to view lab/test results, encounter notes, upcoming appointments, etc.  Non-urgent messages can be sent to your provider as well.   To learn more about what you can do with MyChart, go to [email or web address].    Your next appointment:   Follow up to be determined  Important Information About Sugar        Important Information About Sugar

## 2021-10-23 NOTE — Progress Notes (Unsigned)
Enrolled patient for a 3 Day Zio XT monitor to be mailed to patients home   Monitor mailed to patient [phone] fell off in less than 24 hours.  Charges cancelled.  Redo serial # [id number] applied in office using tincture of benzoin.  [employer] Neeral Patel notified.

## 2021-10-23 NOTE — Progress Notes (Signed)
ELECTROPHYSIOLOGY CONSULT NOTE  Patient ID: Donald Murray, MRN: [id number], DOB/AGE: 12-08-1985 36 y.o. Admit date: (Not on file) Date of Consult: 10/23/2021  Primary Physician: Pcp, No Primary Cardiologist:  DB/BCr     Donald Murray is a 36 y.o. male who is being seen today for the evaluation of an ICD at the request of Dr. Haroldine Laws.    HPI Donald Murray is a 36 y.o. male referred for consideration of an ICD.  He is a former IV drug abuser, clean now since 12/20 who presented with acute heart failure 9/21.  Found to have a nonischemic cardiomyopathy, guideline directed therapy has been limited by low output prompting the discontinuation of the beta-blocker.  More recently he has been tried on ivabradine and uptitrated without appreciable change in heart rate  Early on noted to have frequent PVC>> ZIO monitoring PVCs 12/21 -- 4.3%.  Mean heart rate 104  No syncope, palps, edema.  Some PND and DOE < 100 -200 ft, with some variability which he is not able to predict     DATE TEST EF   10/20 Echo  60-65%   9/21 Echo  20-25%   12//22 Echo   <20 %   1/23 LHC <10%  No obs CAD CI 1.5  PCWP 28 benign  5/23 `Echo  20-25%    Date Cr K Hgb  5/23 1.07 2.9 15.4(2/23)            Past Medical History:  Diagnosis Date   CHF (congestive heart failure) ([place])    Hypertension    Obesity    Snoring       Surgical History:  Past Surgical History:  Procedure Laterality Date   I & D EXTREMITY Right 03/04/2019   Procedure: IRRIGATION AND DEBRIDEMENT OF RIGHT ELBOW;  Surgeon: Roseanne Kaufman, MD;  Location: [facility] [facility];  Service: Orthopedics;  Laterality: Right;   IRRIGATION AND DEBRIDEMENT ABSCESS Left 03/04/2019   Procedure: Irrigation And Debridement Abscess Left hand;  Surgeon: Roseanne Kaufman, MD;  Location: [facility];  Service: Orthopedics;  Laterality: Left;   RIGHT/LEFT HEART CATH AND CORONARY ANGIOGRAPHY N/A 06/12/2021   Procedure: RIGHT/LEFT HEART CATH AND CORONARY ANGIOGRAPHY;   Surgeon: Jolaine Artist, MD;  Location: [facility];  Service: Cardiovascular;  Laterality: N/A;   UMBILICAL HERNIA REPAIR       Home Meds: Current Meds  Medication Sig   dapagliflozin propanediol (FARXIGA) 10 MG TABS tablet Take 1 tablet (10 mg total) by mouth daily before breakfast.   digoxin (LANOXIN) 0.125 MG tablet Take 1 tablet (0.125 mg total) by mouth daily.   ivabradine (CORLANOR) 7.5 MG TABS tablet Take 1 tablet (7.5 mg total) by mouth 2 (two) times daily with a meal.   metolazone (ZAROXOLYN) 2.5 MG tablet Take 1 tablet (2.5 mg total) by mouth once a week on Wednesdays with 2 extra tablets of potassium   potassium chloride SA (KLOR-CON M) 20 MEQ tablet Take 4 tablets (80 mEq total) by mouth daily.   sacubitril-valsartan (ENTRESTO) 97-103 MG Take 1 tablet by mouth 2 (two) times daily.   spironolactone (ALDACTONE) 25 MG tablet Take 1 tablet (25 mg total) by mouth daily.   torsemide (DEMADEX) 20 MG tablet Take 2 tablets (40 mg total) by mouth 2 (two) times daily.    Allergies: No Known Allergies  Social History   Socioeconomic History   Marital status: Single    Spouse name: Not on file   Number of children: Not  on file   Years of education: Not on file   Highest education level: Not on file  Occupational History   Not on file  Tobacco Use   Smoking status: Every Day    Packs/day: 1.00    Years: 16.00    Pack years: 16.00    Types: Cigarettes   Smokeless tobacco: Never  Vaping Use   Vaping Use: Some days   Substances: Nicotine  Substance and Sexual Activity   Alcohol use: Not Currently    Comment: Pt stated "2 years clean"   Drug use: Not Currently    Comment: Pt stated "It was opiates"   Sexual activity: Not on file  Other Topics Concern   Not on file  Social History Narrative   Not on file   Social Determinants of Health   Financial Resource Strain: Low Risk    Difficulty of Paying Living Expenses: Not very hard  Food Insecurity: No Food  Insecurity   Worried About Running Out of Food in the Last Year: Never true   Ran Out of Food in the Last Year: Never true  Transportation Needs: No Transportation Needs   Lack of Transportation (Medical): No   Lack of Transportation (Non-Medical): No  Physical Activity: Not on file  Stress: Not on file  Social Connections: Not on file  Intimate Partner Violence: Not on file     Family History  Problem Relation Age of Onset   Hypertension Mother    Hypertension Father      ROS:  Please see the history of present illness.     All other systems reviewed and negative.    Physical Exam: Blood pressure 110/70, pulse (!) 103, height 5\' 11"  (1.803 m), weight (!) 362 lb (164.2 kg). General: Well developed, Morbidly obese  male in no acute distress. Head: Normocephalic, atraumatic, sclera non-icteric, no xanthomas, nares are without discharge. EENT: normal  Lymph Nodes:  none Neck: Negative for carotid bruits. JVD not elevated. Back:without scoliosis kyphosis  Lungs: Clear bilaterally to auscultation without wheezes, rales, or rhonchi. Breathing is unlabored. Heart: RRR with S1 S2. No  murmur . No rubs, or gallops appreciated. Abdomen: Soft, non-tender, non-distended with normoactive bowel sounds. No hepatomegaly. No rebound/guarding. No obvious abdominal masses. Msk:  Strength and tone appear normal for age. Extremities: No clubbing or cyanosis. No edema.  Distal pedal pulses are 2+ and equal bilaterally. Skin: Warm and Dry Neuro: Alert and oriented X 3. CN III-XII intact Grossly normal sensory and motor function . Psych:  Responds to questions appropriately with a normal affect.        EKG: sinus @ 103 14/10/35   Assessment and Plan:   Nonischemic Cardiomyopathy  Congestive heart Failure  Sinus tachycardia  Morbid Obesity  Sleep apnea severe  to be treated   Hypokalemia   Patient has persistent left ventricular dysfunction despite guideline directed medical  therapy vsans beta-blockers stopped because of low cardiac output.  His sinus tachycardia may be consequential to his hemodynamic limitations although it has been fast going back a couple of years.  It prompted the question as to whether his tachycardia is contributing to and not entirely consequential to his cardiomyopathy.  We will review with Dr. Reine Just; he has not really slowed very much with the introduction of the uptitration of ivabradine.  It makes me wonder whether there is a role for a trial of amiodarone.  Discussed with Dr. Reine Just.  We will do 3-day monitor to assess mean heart rate,  if it is greater than 100 we will consider an amiodarone trial, if not we will proceed with ICD implantation.  Have reviewed the potential benefits and risks of ICD implantation including but not limited to death, perforation of heart or lung, lead dislodgement, infection,  device malfunction and inappropriate shocks.  The patient and family express understanding and are willing to proceed.    Agree with need for sleep apnea therapy.  His last potassium level was very low.  Recheck it today.  May benefit from further up titration of the spironolactone--  No longer taking drugs, not since 12/20   Virl Axe

## 2021-10-24 LAB — BASIC METABOLIC PANEL
BUN/Creatinine Ratio: 11 (ref 9–20)
BUN: 11 mg/dL (ref 6–20)
CO2: 26 mmol/L (ref 20–29)
Calcium: 9.4 mg/dL (ref 8.7–10.2)
Chloride: 94 mmol/L — ABNORMAL LOW (ref 96–106)
Creatinine, Ser: 0.98 mg/dL (ref 0.76–1.27)
Glucose: 81 mg/dL (ref 70–99)
Potassium: 3.6 mmol/L (ref 3.5–5.2)
Sodium: 138 mmol/L (ref 134–144)
eGFR: 103 mL/min/{1.73_m2} (ref >59)

## 2021-11-01 DIAGNOSIS — E876 Hypokalemia: Secondary | ICD-10-CM | POA: Diagnosis not present

## 2021-11-01 DIAGNOSIS — R000 Tachycardia, unspecified: Secondary | ICD-10-CM | POA: Diagnosis not present

## 2021-11-01 DIAGNOSIS — I5022 Chronic systolic (congestive) heart failure: Secondary | ICD-10-CM | POA: Diagnosis not present

## 2021-11-03 ENCOUNTER — Other Ambulatory Visit (HOSPITAL_COMMUNITY): Payer: Self-pay

## 2021-12-01 ENCOUNTER — Other Ambulatory Visit (HOSPITAL_COMMUNITY): Payer: Self-pay

## 2021-12-02 ENCOUNTER — Other Ambulatory Visit (HOSPITAL_COMMUNITY): Payer: Self-pay

## 2021-12-18 ENCOUNTER — Telehealth: Payer: Self-pay

## 2021-12-18 ENCOUNTER — Encounter: Payer: Self-pay | Admitting: Internal Medicine

## 2021-12-18 ENCOUNTER — Ambulatory Visit (INDEPENDENT_AMBULATORY_CARE_PROVIDER_SITE_OTHER): Payer: BC Managed Care – PPO | Admitting: Internal Medicine

## 2021-12-18 VITALS — Ht 71.0 in

## 2021-12-18 DIAGNOSIS — I420 Dilated cardiomyopathy: Secondary | ICD-10-CM

## 2021-12-18 DIAGNOSIS — I5021 Acute systolic (congestive) heart failure: Secondary | ICD-10-CM

## 2021-12-18 NOTE — Telephone Encounter (Signed)
Attempted phone call to pt at the the request of Dr Graciela Husbands to schedule virtual visit to further discuss ICD implantation.  Left voicemail message to contact RN at [phone] and will also send message to pt through MyChart.

## 2021-12-18 NOTE — Telephone Encounter (Signed)
I called to get the patient ready for his telephone visit with Dr. Graciela Husbands. The patient was notified that it's saying that his [employer] is inactive and does he have a different insurance. The patient said he was in between jobs and will be getting new insurance. The patient said yes he wanted to still have his appointment when he was asked.

## 2021-12-18 NOTE — Progress Notes (Signed)
Electrophysiology TeleHealth Note   Due to national recommendations of social distancing due to COVID 19, an audio/video telehealth visit is felt to be most appropriate for this patient at this time.  See MyChart message from today for the patient's consent to telehealth for [facility].   Date:  12/18/2021   ID:  Donald Murray, DOB Jun 06, 1985, MRN [id number]  Location: patient's home  Provider location: [address], [place]  Evaluation Performed: Follow-up visit  PCP:  Pcp, No  Cardiologist:   DB Electrophysiologist:  SK   Chief Complaint:  cardiomyopathy  History of Present Illness:    Donald Murray is a 36 y.o. male who presents via audio/video conferencing for a telehealth visit today.  Since last being seen in our clinic for consideration of an ICD in the setting of nonischemic cardiomyopathy with persistent sinus tachycardia and evidence of hypokalemia the patient reports dong about the same.  Limiting dyspnea  Dr Dorthea Cove has recommended proceeding with ICD at this time  May need uptitration of spiro for hypokalemia     TEST EF    10/20 Echo  60-65%    9/21 Echo  20-25%    12//22 Echo   <20 %    1/23 LHC <10%  No obs CAD CI 1.5  PCWP 28 benign  5/23 `Echo  20-25%      Date Cr K Hgb Dig  5/23 1.07 2.9 15.4(2/23) 0.4   6/23 0.98 3.6         The patient denies symptoms of fevers, chills, cough, or new SOB worrisome for COVID 19.    Past Medical History:  Diagnosis Date   CHF (congestive heart failure) ([facility])    Hypertension    Obesity    Snoring     Past Surgical History:  Procedure Laterality Date   I & D EXTREMITY Right 03/04/2019   Procedure: IRRIGATION AND DEBRIDEMENT OF RIGHT ELBOW;  Surgeon: Dominica Severin, MD;  Location: [facility];  Service: Orthopedics;  Laterality: Right;   IRRIGATION AND DEBRIDEMENT ABSCESS Left 03/04/2019   Procedure: Irrigation And Debridement Abscess Left hand;  Surgeon: Dominica Severin, MD;  Location: [facility];   Service: Orthopedics;  Laterality: Left;   RIGHT/LEFT HEART CATH AND CORONARY ANGIOGRAPHY N/A 06/12/2021   Procedure: RIGHT/LEFT HEART CATH AND CORONARY ANGIOGRAPHY;  Surgeon: Dolores Patty, MD;  Location: [facility];  Service: Cardiovascular;  Laterality: N/A;   UMBILICAL HERNIA REPAIR      Current Outpatient Medications  Medication Sig Dispense Refill   dapagliflozin propanediol (FARXIGA) 10 MG TABS tablet Take 1 tablet (10 mg total) by mouth daily before breakfast. 90 tablet 3   digoxin (LANOXIN) 0.125 MG tablet Take 1 tablet (0.125 mg total) by mouth daily. 90 tablet 3   ivabradine (CORLANOR) 7.5 MG TABS tablet Take 1 tablet by mouth 2 times daily with a meal. 60 tablet 6   metolazone (ZAROXOLYN) 2.5 MG tablet Take 1 tablet (2.5 mg total) by mouth once a week on Wednesdays with 2 extra tablets of potassium 30 tablet 1   potassium chloride SA (KLOR-CON M) 20 MEQ tablet Take 4 tablets (80 mEq total) by mouth daily. 260 tablet 3   sacubitril-valsartan (ENTRESTO) 97-103 MG Take 1 tablet by mouth 2 (two) times daily. 180 tablet 3   spironolactone (ALDACTONE) 25 MG tablet Take 1 tablet (25 mg total) by mouth daily. 90 tablet 3   torsemide (DEMADEX) 20 MG tablet Take 2 tablets (40 mg  total) by mouth 2 (two) times daily. 360 tablet 3   No current facility-administered medications for this visit.    Allergies:   Patient has no known allergies.     Exam:    Vital Signs:  Ht 5\' 11"  (1.803 m)   BMI 50.49 kg/m        Labs/Other Tests and Data Reviewed:    Recent Labs: 06/02/2021: Magnesium 2.2 06/18/2021: ALT 18 07/09/2021: Hemoglobin 15.4; Platelets 290 09/24/2021: B Natriuretic Peptide 70.2 10/23/2021: BUN 11; Creatinine, Ser 0.98; Potassium 3.6; Sodium 138   Wt Readings from Last 3 Encounters:  10/23/21 (!) 362 lb (164.2 kg)  09/29/21 (!) 350 lb (158.8 kg)  09/24/21 (!) 356 lb 9.6 oz (161.8 kg)     Other studies personally reviewed: Additional studies/ records that  were reviewed today include:      ASSESSMENT & PLAN:    Nonischemic Cardiomyopathy   Congestive heart Failure   Sinus tachycardia   Morbid Obesity   Sleep apnea severe  to be treated    Hypokalemia  Have discussed with him re proceeding with ICD per Dr DB recommendation .  Awaits CPAP machine       COVID 19 screen The patient denies symptoms of COVID 19 at this time.  The importance of social distancing was discussed today.  Follow-up:      Current medicines are reviewed at length with the patient today.   The patient does not have concerns regarding his medicines.  The following changes were made today:  none  Labs/ tests ordered today include: preop blood work No orders of the defined types were placed in this encounter.   Future procedures: ICD implantation  Patient Risk:  after full review of this patients clinical status, I feel that they are at moderate risk at this time.  Today, I have spent 11 minutes with the patient with telehealth technology discussing the above.  Signed, 11/24/21, MD  12/18/2021 2:30 PM     [facility] [address] [place] [phone] (office) [phone] (fax)

## 2021-12-29 NOTE — Progress Notes (Signed)
ADVANCED HF CLINIC NOTE  Primary Care: Pcp, No Primary Cardiologist: Olga Millers, MD HF: Dr. Gala Romney  HPI:  Donald Murray is a morbidly obese 36 y.o. male with HTN, previous IVDA (heroin), tobacco use and systolic HF (onset 9/21) referred by Dr. Jens Som for further management of his HF.   Echo 10/20 EF 60-65%   Admitted to [facility] 9/21 with new onset HF in setting of severe HTN 174/128.Marland Kitchen ECG with sinus tach and frequent PVCs.   Echo 02/16/20: EF 20-25% Moderate RV dysfunction.   I saw him in HF Consultation for the first time 04/24/20. 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 Lasix and Entresto increased. Digoxin added. Zio placed to quantify PVC burden - 4.3%.  Admitted 12/22 with A/C CHF exacerbation. Echo LVEF <20% w/ global HK, no visible thrombus, RV okay, mild MR.  Underwent cath 06/12/21 with normal cors. EF < 10% Elevated filling pressures (PCWP 28) and low output (CI 1.5). Lasix increased to 80 bid.  Sleep study 02/23: Severe OSA, moderate central sleep apnea. Waiting for CPAP machine, reports he was told there is a backorder on supplies.  Echo 09/24/21: EF 20-25% RV moderately HK  Seen recently by Dr. Graciela Husbands and planning ICD.   Here today for f/u. Weight is up 26 lb from last visit. Has not been weighing daily at home. States he has been trying to quit smoking and has been eating a bit more. Down to 2-3 cigarettes a day. Notes some shortness of breath with longer walks but this has been stable, no recent change. Denies orthopnea, PND, LE edema. Reports adherence with medications. Sometimes misses am States he does not add salt to meals, admits he struggles with fluid restriction.  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   Cardiac studies:   CPX test 07/18/21 FVC 5.12  (92%)      FEV1 4.34  (96%)        FEV1/FVC 85   (103%)        MVV 164  (93%)   Resting HR: 126 Standing HR:125 Peak HR: 175   (95% age predicted max HR)  BP rest (standing): 92/70 BP peak:  148/72  Peak VO2: 21.6 (87% predicted peak VO2)  When adjusted to the patient's ideal body weight of 180.3 lb (81.8 kg) the peak VO2 is 41.6 ml/kg (ibw)/min (99% of the ibw-adjusted predicted).  VE/VCO2 slope:  45  VE/MVV:  92%  O2pulse:  19 (90% predicted O2pulse)    RHC 1/23 Ao = 91/72 (81) LV = 108/37 RA = 9 RV = 51/15 PA = 66/38 (50) PCW = 28 Fick cardiac output/index = 3.9/1.5 PVR = 5.7 SVR = 1,484 Ao sat = 98% PA sat = 55%, 54% PAPI = 3.1  Zio 12/21 Sinus - average HR 104 4.3% PVCs    Past Medical History:  Diagnosis Date   CHF (congestive heart failure) (HCC)    Hypertension    Obesity    Snoring     Current Outpatient Medications  Medication Sig Dispense Refill   dapagliflozin propanediol (FARXIGA) 10 MG TABS tablet Take 1 tablet (10 mg total) by mouth daily before breakfast. 90 tablet 3   digoxin (LANOXIN) 0.125 MG tablet Take 1 tablet (0.125 mg total) by mouth daily. 90 tablet 3   ivabradine (CORLANOR) 7.5 MG TABS tablet Take 1 tablet by mouth 2 times daily with a meal. 60 tablet 6   metolazone (ZAROXOLYN) 2.5 MG tablet Take 1 tablet (2.5 mg total) by mouth  once a week on Wednesdays with 2 extra tablets of potassium 30 tablet 1   potassium chloride SA (KLOR-CON M) 20 MEQ tablet Take 4 tablets (80 mEq total) by mouth daily. 260 tablet 3   sacubitril-valsartan (ENTRESTO) 97-103 MG Take 1 tablet by mouth 2 (two) times daily. 180 tablet 3   spironolactone (ALDACTONE) 25 MG tablet Take 1 tablet (25 mg total) by mouth daily. 90 tablet 3   torsemide (DEMADEX) 20 MG tablet Take 2 tablets (40 mg total) by mouth 2 (two) times daily. 360 tablet 3   No current facility-administered medications for this encounter.    No Known Allergies    Social History   Socioeconomic History   Marital status: Single    Spouse name: Not on file   Number of children: Not on file   Years of education: Not on file   Highest education level: Not on file  Occupational History    Not on file  Tobacco Use   Smoking status: Every Day    Packs/day: 1.00    Years: 16.00    Total pack years: 16.00    Types: Cigarettes   Smokeless tobacco: Never  Vaping Use   Vaping Use: Some days   Substances: Nicotine  Substance and Sexual Activity   Alcohol use: Not Currently    Comment: Pt stated "2 years clean"   Drug use: Not Currently    Comment: Pt stated "It was opiates"   Sexual activity: Not on file  Other Topics Concern   Not on file  Social History Narrative   Not on file   Social Determinants of Health   Financial Resource Strain: Low Risk  (01/17/2021)   Overall Financial Resource Strain (CARDIA)    Difficulty of Paying Living Expenses: Not very hard  Food Insecurity: No Food Insecurity (01/17/2021)   Hunger Vital Sign    Worried About Running Out of Food in the Last Year: Never true    Ran Out of Food in the Last Year: Never true  Transportation Needs: No Transportation Needs (01/17/2021)   PRAPARE - Administrator, Civil Service (Medical): No    Lack of Transportation (Non-Medical): No  Physical Activity: Not on file  Stress: Not on file  Social Connections: Not on file  Intimate Partner Violence: Not on file      Family History  Problem Relation Age of Onset   Hypertension Mother    Hypertension Father     Vitals:   12/31/21 1507  BP: 110/70  Pulse: (!) 112  SpO2: 95%  Weight: (!) 173.6 kg (382 lb 12.8 oz)    Wt Readings from Last 3 Encounters:  12/31/21 (!) 173.6 kg (382 lb 12.8 oz)  10/23/21 (!) 164.2 kg (362 lb)  09/29/21 (!) 158.8 kg (350 lb)    PHYSICAL EXAM: General:  Well appearing. Ambulated into clinic. HEENT: normal Neck: supple. no JVD. Carotids 2+ bilat; no bruits.  Cor: PMI nondisplaced. Regular rate & rhythm. No rubs, gallops or murmurs. Lungs: clear Abdomen: soft, nontender, nondistended, obese.  Extremities: no cyanosis, clubbing, rash, 1+ edema Neuro: alert & orientedx3, cranial nerves grossly intact.  moves all 4 extremities w/o difficulty. Affect pleasant    ASSESSMENT & PLAN:  1. Chronic systolic HF - diagnosed 9/21 Echo EF 20-25% RV moderately HK - suspect HTN vs PVC-mediated (PVC burden 4.3% - probably not high enough to cause CM) - cath 1/23 no CAD EF < 10% - CPX 2/23 pVO2: 21.6 (87% predicted  peak VO2) corrected for ibw pVO2 41.6 ml/kg (ibw)/min (99% of the ibw-adjusted predicted). Slope:45 O2pulse:  19 (90% pred) - Echo today 09/24/21: EF 20-25% RV moderately HK Personally reviewed - Stable NYHA II-III. CPX test reassuring  - Weight up from last visit, likely d/t caloric intake rather than volume overload. Has been trying to quit smoking. Volume looks good on exam. Continue Tosemide 40 BID + metolazone once a week. - Continue Farxiga 10 - Decrease entresto to 49/51 mg BID to see if this helps with some of his fatigue. - Continue digoxin 0.125 - Continue spiro 25 - Off carvedilol due to low output - Continue Ivabradine 7.5 mg BID - I remained concerned about possible need for advanced therapies but CPX test shows relatively well preserved functional capacity but slope is quite high. Continue to follow closely - Body mass index is 53.39 kg/m. - Not transplant candidate due to size but would likely be a VAD candidate - Seen by Dr. Graciela Husbands. Planning for ICD implant. Will reach out to EP regarding procedure date. - Labs today  2. HTN, severe - BP ok - Meds as above  3. Frequent PVCs - Zio 12/21 4.3% PVCs - probably not high enough burden to cause CM  - Has severe OSA. Awaiting CPAP equipment  4. IVDA (heroin) - reports no use since 12/20  5. Morbid obesity - needs weight loss  - will refer for consideration of wegovy  6. Sleep apnea - Severe obstructive, moderate central - Supplies on backorder  7. Tobacco use - Congratulated him on cutting back, now 2-3 cigarettes daily. Complete cessation recommended   Follow-up: 3 months  FINCH, LINDSAY N, PA-C  3:23  PM  Patient seen and examined with the above-signed Advanced Practice Provider and/or Housestaff. I personally reviewed laboratory data, imaging studies and relevant notes. I independently examined the patient and formulated the important aspects of the plan. I have edited the note to reflect any of my changes or salient points. I have personally discussed the plan with the patient and/or family.  Doing OK. NYHA II-III. Volume status ok. Trying to quit smoking so has gained > 20 pounds. Gets fatigued with his meds at times. Pending ICD with Dr. Graciela Husbands  General:  Well appearing. No resp difficulty HEENT: normal Neck: supple. no JVD. Carotids 2+ bilat; no bruits. No lymphadenopathy or thryomegaly appreciated. Cor: PMI nondisplaced. Regular rate & rhythm. No rubs, gallops or murmurs. Lungs: clear Abdomen: obese soft, nontender, nondistended. No hepatosplenomegaly. No bruits or masses. Good bowel sounds. Extremities: no cyanosis, clubbing, rash, edema Neuro: alert & orientedx3, cranial nerves grossly intact. moves all 4 extremities w/o difficulty. Affect pleasant   Stable NYHA II-III. Weight increasing with smoking cessation efforts. Refer for [employer].Can cut Entresto back to 49/51. We will f/u with Dr. Graciela Husbands [id number] of ICD. Labs today.   Arvilla Meres, MD  4:25 PM

## 2021-12-31 ENCOUNTER — Other Ambulatory Visit (HOSPITAL_COMMUNITY): Payer: Self-pay

## 2021-12-31 ENCOUNTER — Telehealth: Payer: Self-pay | Admitting: Pharmacist

## 2021-12-31 ENCOUNTER — Ambulatory Visit (HOSPITAL_COMMUNITY)
Admission: RE | Admit: 2021-12-31 | Discharge: 2021-12-31 | Disposition: A | Discharge status: Home / Self Care | Payer: BLUE CROSS/BLUE SHIELD | Source: Ambulatory Visit | Attending: Internal Medicine | Admitting: Internal Medicine

## 2021-12-31 ENCOUNTER — Encounter (HOSPITAL_COMMUNITY): Payer: Self-pay | Admitting: Internal Medicine

## 2021-12-31 VITALS — BP 110/70 | HR 112 | Wt 382.8 lb

## 2021-12-31 DIAGNOSIS — Z720 Tobacco use: Secondary | ICD-10-CM

## 2021-12-31 DIAGNOSIS — G4731 Primary central sleep apnea: Secondary | ICD-10-CM | POA: Diagnosis not present

## 2021-12-31 DIAGNOSIS — F17210 Nicotine dependence, cigarettes, uncomplicated: Secondary | ICD-10-CM | POA: Diagnosis not present

## 2021-12-31 DIAGNOSIS — G4733 Obstructive sleep apnea (adult) (pediatric): Secondary | ICD-10-CM | POA: Insufficient documentation

## 2021-12-31 DIAGNOSIS — Z7984 Long term (current) use of oral hypoglycemic drugs: Secondary | ICD-10-CM | POA: Diagnosis not present

## 2021-12-31 DIAGNOSIS — R0602 Shortness of breath: Secondary | ICD-10-CM | POA: Insufficient documentation

## 2021-12-31 DIAGNOSIS — F1110 Opioid abuse, uncomplicated: Secondary | ICD-10-CM | POA: Diagnosis not present

## 2021-12-31 DIAGNOSIS — I5022 Chronic systolic (congestive) heart failure: Secondary | ICD-10-CM

## 2021-12-31 DIAGNOSIS — I493 Ventricular premature depolarization: Secondary | ICD-10-CM | POA: Insufficient documentation

## 2021-12-31 DIAGNOSIS — Z716 Tobacco abuse counseling: Secondary | ICD-10-CM | POA: Insufficient documentation

## 2021-12-31 DIAGNOSIS — Z79899 Other long term (current) drug therapy: Secondary | ICD-10-CM | POA: Insufficient documentation

## 2021-12-31 DIAGNOSIS — I110 Hypertensive heart disease with heart failure: Secondary | ICD-10-CM | POA: Insufficient documentation

## 2021-12-31 DIAGNOSIS — Z6841 Body Mass Index (BMI) 40.0 and over, adult: Secondary | ICD-10-CM | POA: Diagnosis not present

## 2021-12-31 LAB — COMPREHENSIVE METABOLIC PANEL
ALT: 19 U/L (ref 0–44)
AST: 18 U/L (ref 15–41)
Albumin: 3.2 g/dL — ABNORMAL LOW (ref 3.5–5.0)
Alkaline Phosphatase: 53 U/L (ref 38–126)
Anion gap: 9 (ref 5–15)
BUN: 13 mg/dL (ref 6–20)
CO2: 26 mmol/L (ref 22–32)
Calcium: 9.2 mg/dL (ref 8.9–10.3)
Chloride: 102 mmol/L (ref 98–111)
Creatinine, Ser: 0.93 mg/dL (ref 0.61–1.24)
GFR, Estimated: >60 mL/min (ref >60)
Glucose, Bld: 95 mg/dL (ref 70–99)
Potassium: 3.2 mmol/L — ABNORMAL LOW (ref 3.5–5.1)
Sodium: 137 mmol/L (ref 135–145)
Total Bilirubin: 0.3 mg/dL (ref 0.3–1.2)
Total Protein: 6.9 g/dL (ref 6.5–8.1)

## 2021-12-31 LAB — DIGOXIN LEVEL: Digoxin Level: <0.2 ng/mL — ABNORMAL LOW (ref 0.8–2.0)

## 2021-12-31 LAB — BRAIN NATRIURETIC PEPTIDE: B Natriuretic Peptide: 34.5 pg/mL (ref 0.0–100.0)

## 2021-12-31 MED ORDER — ENTRESTO 49-51 MG PO TABS
1.0000 | ORAL_TABLET | Freq: Two times a day (BID) | ORAL | 11 refills | Status: DC
  Filled 2021-12-31 – 2022-03-10 (×3): qty 180, 90d supply, fill #0
  Filled 2022-11-01 – 2022-11-12 (×2): qty 180, 90d supply, fill #1
  Filled 2022-11-18: qty 180, 90d supply, fill #0

## 2021-12-31 NOTE — Patient Instructions (Signed)
Change Entresto to 49/51 Twice daily  Labs done today, your results will be available in MyChart, we will contact you for abnormal readings.  [facility] have been [facility] to [facility]. They will call you to schedule the appointment  Your physician recommends that you schedule a follow-up appointment in: 3 months   If you have any questions or concerns before your next appointment please send [place] a message through [place] or call our office at [phone].    TO LEAVE A MESSAGE FOR THE NURSE SELECT OPTION 2, PLEASE LEAVE A MESSAGE INCLUDING: YOUR NAME DATE OF BIRTH CALL BACK NUMBER REASON FOR CALL**this is important as we prioritize the call backs  YOU WILL RECEIVE A CALL BACK THE SAME DAY AS LONG AS YOU CALL BEFORE 4:00 PM  At the [facility], you and your health needs are our priority. As part of our continuing mission to provide you with exceptional heart care, we have created designated Provider Care Teams. These Care Teams include your primary Cardiologist (physician) and Advanced Practice Providers (APPs- Physician Assistants and Nurse Practitioners) who all work together to provide you with the care you need, when you need it.   You may see any of the following providers on your designated Care Team at your next follow up: Dr Arvilla Meres Dr Carron Curie, NP Robbie Lis, [place] [facility] [place], [place] Karle Plumber, PharmD   Please be sure to bring in all your medications bottles to every appointment.

## 2021-12-31 NOTE — Telephone Encounter (Signed)
Pt referred by Dr Gala Romney to initiate [facility] for weight loss. Reginal Lutes still on national backorder for the next few months. Pt doesn't have DM so other GLPs not an option. Called pt to discuss. Advised him cannot currently start on GLP due to national backorder, but once Reginal Lutes is available again in the fall, will plan to submit a prior authorization at that time to see if rx is covered by his insurance and will reach out then. He verbalized understanding and was appreciative for the call.

## 2022-01-14 ENCOUNTER — Other Ambulatory Visit (HOSPITAL_COMMUNITY): Payer: Self-pay

## 2022-01-22 ENCOUNTER — Encounter (HOSPITAL_COMMUNITY): Payer: Self-pay

## 2022-02-05 ENCOUNTER — Other Ambulatory Visit (HOSPITAL_COMMUNITY): Payer: Self-pay

## 2022-02-17 ENCOUNTER — Other Ambulatory Visit (HOSPITAL_COMMUNITY): Payer: Self-pay

## 2022-02-17 ENCOUNTER — Telehealth (HOSPITAL_COMMUNITY): Payer: Self-pay | Admitting: Pharmacy Technician

## 2022-02-17 NOTE — Telephone Encounter (Signed)
Patient Advocate Encounter   Received notification from [place] [employer] that prior authorization for Corlanor is required.   PA submitted on CoverMyMeds Key [id number] Status is pending   Will continue to follow.

## 2022-02-18 ENCOUNTER — Other Ambulatory Visit (HOSPITAL_COMMUNITY): Payer: Self-pay

## 2022-02-18 NOTE — Telephone Encounter (Signed)
Advanced Heart Failure Patient Advocate Encounter  Prior Authorization for Corlanor has been approved.    PA# [id number] Effective dates: 02/17/22 through 02/18/23  Charlann Boxer, CPhT

## 2022-02-20 ENCOUNTER — Other Ambulatory Visit (HOSPITAL_COMMUNITY): Payer: Self-pay

## 2022-02-26 ENCOUNTER — Other Ambulatory Visit (HOSPITAL_COMMUNITY): Payer: Self-pay

## 2022-02-26 MED ORDER — BUPROPION HCL 75 MG PO TABS
75.0000 mg | ORAL_TABLET | Freq: Two times a day (BID) | ORAL | 0 refills | Status: DC
  Filled 2022-02-26: qty 60, 30d supply, fill #0

## 2022-02-26 MED ORDER — WEGOVY 0.25 MG/0.5ML ~~LOC~~ SOAJ
0.2500 mg | SUBCUTANEOUS | 0 refills | Status: DC
  Filled 2022-02-26 – 2022-03-10 (×3): qty 2, 28d supply, fill #0

## 2022-02-27 ENCOUNTER — Other Ambulatory Visit (HOSPITAL_COMMUNITY): Payer: Self-pay

## 2022-03-04 ENCOUNTER — Other Ambulatory Visit (HOSPITAL_COMMUNITY): Payer: Self-pay

## 2022-03-10 ENCOUNTER — Other Ambulatory Visit (HOSPITAL_COMMUNITY): Payer: Self-pay

## 2022-03-10 ENCOUNTER — Ambulatory Visit
Admission: EM | Admit: 2022-03-10 | Discharge: 2022-03-10 | Disposition: A | Discharge status: Home / Self Care | Payer: BC Managed Care – PPO | Attending: Nurse Practitioner | Admitting: Nurse Practitioner

## 2022-03-10 DIAGNOSIS — J069 Acute upper respiratory infection, unspecified: Secondary | ICD-10-CM | POA: Diagnosis present

## 2022-03-10 DIAGNOSIS — R059 Cough, unspecified: Secondary | ICD-10-CM

## 2022-03-10 DIAGNOSIS — Z1152 Encounter for screening for COVID-19: Secondary | ICD-10-CM | POA: Insufficient documentation

## 2022-03-10 LAB — RESP PANEL BY RT-PCR (FLU A&B, COVID) ARPGX2
Influenza A by PCR: NEGATIVE
Influenza B by PCR: NEGATIVE
SARS Coronavirus 2 by RT PCR: NEGATIVE

## 2022-03-10 MED ORDER — FLUTICASONE PROPIONATE 50 MCG/ACT NA SUSP
2.0000 | Freq: Every day | NASAL | 0 refills | Status: DC
  Filled 2022-03-10: qty 16, 30d supply, fill #0

## 2022-03-10 MED ORDER — PROMETHAZINE-DM 6.25-15 MG/5ML PO SYRP
5.0000 mL | ORAL_SOLUTION | Freq: Four times a day (QID) | ORAL | 0 refills | Status: DC | PRN
  Filled 2022-03-10: qty 140, 7d supply, fill #0

## 2022-03-10 NOTE — Discharge Instructions (Addendum)
The COVID/flu test is pending.  As discussed, you will be contacted if the results of the test are positive.  You are a candidate to receive Paxlovid as an antiviral if your COVID test is positive. Take medication as prescribed. Increase fluids and allow for plenty of rest. Recommend Tylenol or ibuprofen as needed for pain, fever, or general discomfort. Recommend using a humidifier at bedtime during sleep to help with cough and nasal congestion. Sleep elevated on 2 pillows while cough symptoms persist. Try to cut back on your smoking while symptoms persist to help your cough. Follow-up with your primary care physician if symptoms fail to improve.

## 2022-03-10 NOTE — ED Triage Notes (Signed)
Pt reports cough, nasal congestion, chills x 3 days. NyQuil and Claritin gives no relief.

## 2022-03-10 NOTE — ED Provider Notes (Signed)
[facility]    CSN: [id number] Arrival date & time: 03/10/22  1027      History   Chief Complaint Chief Complaint  Patient presents with   Cough    Congestion - Entered by patient    HPI Donald Murray is a 36 y.o. male.   The history is provided by the patient.   Patient presents for complaints of cough, nasal congestion, and chills that been present for the past 3 days.  He denies fever, headache, sore throat, ear pain, increased wheezing, increased shortness of breath, or difficulty breathing.  Patient states that he has been taking NyQuil and Claritin with no relief.  States cough is worse at night.  He states that he is coughing to the point where he begins to feel lightheaded.  He reports that he has received the [employer] COVID-vaccine.  He states that he does work around a lot of people.  Past Medical History:  Diagnosis Date   CHF (congestive heart failure) (HCC)    Hypertension    Obesity    Snoring     Patient Active Problem List   Diagnosis Date Noted   OSA (obstructive sleep apnea) 09/29/2021   Acute on chronic systolic heart failure (HCC) 05/21/2021   Acute on chronic heart failure (HCC) 12/29/2020   PVC (premature ventricular contraction) 08/15/2020   Essential hypertension 03/20/2020   Acute congestive heart failure (HCC)    Cellulitis 03/04/2019   IVDU (intravenous drug user) 03/04/2019    Past Surgical History:  Procedure Laterality Date   I & D EXTREMITY Right 03/04/2019   Procedure: IRRIGATION AND DEBRIDEMENT OF RIGHT ELBOW;  Surgeon: Dominica Severin, MD;  Location: [facility] OR;  Service: Orthopedics;  Laterality: Right;   IRRIGATION AND DEBRIDEMENT ABSCESS Left 03/04/2019   Procedure: Irrigation And Debridement Abscess Left hand;  Surgeon: Dominica Severin, MD;  [facility];  Service: Orthopedics;  Laterality: Left;   RIGHT/LEFT HEART CATH AND CORONARY ANGIOGRAPHY N/A 06/12/2021   Procedure: RIGHT/LEFT HEART CATH AND CORONARY  ANGIOGRAPHY;  Surgeon: Dolores Patty, MD;  Location: [facility] INVASIVE CV LAB;  Service: Cardiovascular;  Laterality: N/A;   UMBILICAL HERNIA REPAIR         Home Medications    Prior to Admission medications   Medication Sig Start Date End Date Taking? Authorizing Provider  fluticasone (FLONASE) 50 MCG/ACT nasal spray Place 2 sprays into both nostrils daily. 03/10/22  Yes Tallie Dodds-Warren, Sadie Haber, NP  promethazine-dextromethorphan (PROMETHAZINE-DM) 6.25-15 MG/5ML syrup Take 5 mLs by mouth 4 (four) times daily as needed for cough. 03/10/22  Yes Leilanni Halvorson-Warren, Sadie Haber, NP  Pseudoeph-Doxylamine-DM-APAP (NYQUIL PO) Take by mouth.   Yes [provider]  buPROPion (WELLBUTRIN) 75 MG tablet Take 1 tablet (75 mg total) by mouth 2 (two) times daily. 02/26/22     dapagliflozin propanediol (FARXIGA) 10 MG TABS tablet Take 1 tablet (10 mg total) by mouth daily before breakfast. 06/20/21   Milford, Anderson Malta, FNP  digoxin (LANOXIN) 0.125 MG tablet Take 1 tablet (0.125 mg total) by mouth daily. 08/01/21   Bensimhon, Bevelyn Buckles, MD  ivabradine (CORLANOR) 7.5 MG TABS tablet Take 1 tablet by mouth 2 times daily with a meal. 09/24/21   Bensimhon, Bevelyn Buckles, MD  metolazone (ZAROXOLYN) 2.5 MG tablet Take 1 tablet (2.5 mg total) by mouth once a week on Wednesdays with 2 extra tablets of potassium 07/09/21   Bensimhon, Bevelyn Buckles, MD  potassium chloride SA (KLOR-CON M) 20 MEQ tablet Take 4  tablets (80 mEq total) by mouth daily. 09/24/21   Bensimhon, Bevelyn Buckles, MD  sacubitril-valsartan (ENTRESTO) 49-51 MG Take 1 tablet by mouth 2 (two) times daily. 12/31/21   Bensimhon, Bevelyn Buckles, MD  Semaglutide-Weight Management (WEGOVY) 0.25 MG/0.5ML SOAJ Inject 0.25 mg into the skin once a week. 02/26/22     spironolactone (ALDACTONE) 25 MG tablet Take 1 tablet (25 mg total) by mouth daily. 08/01/21   Bensimhon, Bevelyn Buckles, MD  torsemide (DEMADEX) 20 MG tablet Take 2 tablets (40 mg total) by mouth 2 (two) times daily. 06/30/21 06/08/22   Bensimhon, Bevelyn Buckles, MD    Family History Family History  Problem Relation Age of Onset   Hypertension Mother    Hypertension Father     Social History Social History   Tobacco Use   Smoking status: Every Day    Packs/day: 1.00    Years: 16.00    Total pack years: 16.00    Types: Cigarettes   Smokeless tobacco: Never  Vaping Use   Vaping Use: Some days   Substances: Nicotine  Substance Use Topics   Alcohol use: Not Currently    Comment: Pt stated "2 years clean"   Drug use: Not Currently    Comment: Pt stated "It was opiates"     Allergies   Patient has no known allergies.   Review of Systems Review of Systems Per HPI  Physical Exam Triage Vital Signs ED Triage Vitals  Enc Vitals Group     BP 03/10/22 1124 (!) 144/92     Pulse Rate 03/10/22 1124 60     Resp 03/10/22 1124 (!) 22     Temp 03/10/22 1124 98.4 F (36.9 C)     Temp Source 03/10/22 1124 Oral     SpO2 03/10/22 1124 95 %     Weight --      Height --      Head Circumference --      Peak Flow --      Pain Score 03/10/22 1123 0     Pain Loc --      Pain Edu? --      Excl. in GC? --    No data found.  Updated Vital Signs BP (!) 144/92 (BP Location: Right Wrist)   Pulse 60   Temp 98.4 F (36.9 C) (Oral)   Resp (!) 22   SpO2 95%   Visual Acuity Right Eye Distance:   Left Eye Distance:   Bilateral Distance:    Right Eye Near:   Left Eye Near:    Bilateral Near:     Physical Exam Vitals and nursing note reviewed.  Constitutional:      General: He is not in acute distress.    Appearance: Normal appearance.  HENT:     Head: Normocephalic.     Right Ear: Tympanic membrane, ear canal and external ear normal.     Left Ear: Tympanic membrane, ear canal and external ear normal.     Nose: Congestion present.     Right Turbinates: Enlarged and swollen.     Left Turbinates: Enlarged and swollen.     Right Sinus: No maxillary sinus tenderness or frontal sinus tenderness.     Left Sinus:  No maxillary sinus tenderness or frontal sinus tenderness.     Mouth/Throat:     Lips: Pink.     Mouth: Mucous membranes are moist.     Pharynx: Uvula midline. Posterior oropharyngeal erythema present. No pharyngeal swelling, oropharyngeal exudate or uvula  swelling.     Tonsils: No tonsillar exudate. 0 on the right. 0 on the left.  Eyes:     Extraocular Movements: Extraocular movements intact.     Conjunctiva/sclera: Conjunctivae normal.     Pupils: Pupils are equal, round, and reactive to light.  Cardiovascular:     Rate and Rhythm: Normal rate and regular rhythm.     Pulses: Normal pulses.     Heart sounds: Normal heart sounds.  Pulmonary:     Effort: Pulmonary effort is normal.     Breath sounds: Normal breath sounds.  Abdominal:     General: Bowel sounds are normal.     Palpations: Abdomen is soft.     Tenderness: There is no abdominal tenderness.  Musculoskeletal:     Cervical back: Normal range of motion.  Lymphadenopathy:     Cervical: No cervical adenopathy.  Skin:    General: Skin is warm and dry.  Neurological:     General: No focal deficit present.     Mental Status: He is alert and oriented to person, place, and time.  Psychiatric:        Mood and Affect: Mood normal.        Behavior: Behavior normal.      UC Treatments / Results  Labs (all labs ordered are listed, but only abnormal results are displayed) Labs Reviewed  RESP PANEL BY RT-PCR (FLU A&B, COVID) [id number]    EKG   Radiology No results found.  Procedures Procedures (including critical care time)  Medications Ordered in UC Medications - No data to display  Initial Impression / Assessment and Plan / UC Course  I have reviewed the triage vital signs and the nursing notes.  Pertinent labs & imaging results that were available during my care of the patient were reviewed by me and considered in my medical decision making (see chart for details).  Patient presents for complaints of upper  respiratory symptoms that been present for the past 2 days.  Patient does not appear in any distress, he is tachypneic and his blood pressure is slightly elevated.  His lung sounds are clear throughout.  COVID/flu test is pending at this time.  Symptoms are consistent with a viral upper respiratory infection with cough.  If the COVID test is positive, patient is a candidate to receive Paxlovid based on his most recent creatinine in August of 0.93.  Supportive care recommendations were provided to the patient along with symptomatic treatment to include fluticasone for his nasal congestion and Promethazine DM 5\mL for his cough.  Patient verbalizes understanding.  All questions were answered.  Patient is stable for discharge. Final Clinical Impressions(s) / UC Diagnoses   Final diagnoses:  Cough, unspecified type  Viral upper respiratory tract infection with cough     Discharge Instructions      The COVID/flu test is pending.  As discussed, you will be contacted if the results of the test are positive.  You are a candidate to receive Paxlovid as an antiviral if your COVID test is positive. Take medication as prescribed. Increase fluids and allow for plenty of rest. Recommend Tylenol or ibuprofen as needed for pain, fever, or general discomfort. Recommend using a humidifier at bedtime during sleep to help with cough and nasal congestion. Sleep elevated on 2 pillows while cough symptoms persist. Try to cut back on your smoking while symptoms persist to help your cough. Follow-up with your primary care physician if symptoms fail to improve.  ED Prescriptions     Medication Sig Dispense Auth. Provider   fluticasone (FLONASE) 50 MCG/ACT nasal spray Place 2 sprays into both nostrils daily. 16 g Zakariya Knickerbocker-Warren, Alda Lea, NP   promethazine-dextromethorphan (PROMETHAZINE-DM) 6.25-15 MG/5ML syrup Take 5 mLs by mouth 4 (four) times daily as needed for cough. 140 mL Ariabella Brien-Warren, Alda Lea, NP       PDMP not reviewed this encounter.   Tish Men, NP 03/10/22 1143

## 2022-03-11 ENCOUNTER — Other Ambulatory Visit (HOSPITAL_COMMUNITY): Payer: Self-pay

## 2022-03-13 ENCOUNTER — Other Ambulatory Visit (HOSPITAL_COMMUNITY): Payer: Self-pay

## 2022-03-13 MED ORDER — ERGOCALCIFEROL 1.25 MG (50000 UT) PO CAPS
1.0000 | ORAL_CAPSULE | ORAL | 1 refills | Status: DC
  Filled 2022-03-13: qty 12, 84d supply, fill #0

## 2022-03-24 ENCOUNTER — Other Ambulatory Visit (HOSPITAL_COMMUNITY): Payer: Self-pay

## 2022-03-30 NOTE — Progress Notes (Signed)
ADVANCED HF CLINIC NOTE  Primary Care: [facility] Primary Cardiologist: Kirk Ruths, MD HF: Dr. Haroldine Laws  HPI:  Donald Murray is a morbidly obese 36 y.o. male with HTN, previous IVDA (heroin), tobacco use and systolic HF (onset 8/58) referred by Dr. [name] for further management of his HF.   Echo 10/20 EF 60-65%   Admitted to [facility] 9/21 with new onset HF in setting of severe HTN 174/128.Marland Kitchen ECG with sinus tach and frequent PVCs.   Echo 02/16/20: EF 20-25% Moderate RV dysfunction.   I saw him in HF Consultation for the first time 04/24/20. 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 Lasix and Entresto increased. Digoxin added. Zio placed to quantify PVC burden - 4.3%.  Admitted 12/22 with A/C CHF exacerbation. Echo LVEF <20% w/ global HK, no visible thrombus, RV okay, mild MR.  Underwent cath 06/12/21 with normal cors. EF < 10% Elevated filling pressures (PCWP 28) and low output (CI 1.5). Lasix increased to 80 bid.  Sleep study 02/23: Severe OSA, moderate central sleep apnea. Waiting for CPAP machine, reports he was told there is a backorder on supplies.  Echo 09/24/21: EF 20-25% RV moderately HK  Seen by Dr. Caryl Comes in 7/23 and planning ICD. But hasn't been done due to scheduling issues.   Here today for f/u. 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 Having good days and bad days but mostly good. Some days just doesn't have much energy (about 2 days/week). Mild DOE. No CP, edema, orthopnea or PND. Not taking extra diuretics. Has gained 30 pounds in last 6 months    Cardiac studies:   CPX test 07/18/21 FVC 5.12  (92%)      FEV1 4.34  (96%)        FEV1/FVC 85   (103%)        MVV 164  (93%)   Resting HR: 126 Standing HR:125 Peak HR: 175   (95% age predicted max HR)  BP rest (standing): 92/70 BP peak: 148/72  Peak VO2: 21.6 (87% predicted peak VO2)  When adjusted to the patient's ideal body weight of 180.3 lb (81.8 kg) the peak VO2 is 41.6 ml/kg (ibw)/min (99% of  the ibw-adjusted predicted).  VE/VCO2 slope:  45  VE/MVV:  92%  O2pulse:  19 (90% predicted O2pulse)    RHC 1/23 Ao = 91/72 (81) LV = 108/37 RA = 9 RV = 51/15 PA = 66/38 (50) PCW = 28 Fick cardiac output/index = 3.9/1.5 PVR = 5.7 SVR = 1,484 Ao sat = 98% PA sat = 55%, 54% PAPI = 3.1  Zio 12/21 Sinus - average HR 104 4.3% PVCs    Past Medical History:  Diagnosis Date   CHF (congestive heart failure) (HCC)    Hypertension    Obesity    Snoring     Current Outpatient Medications  Medication Sig Dispense Refill   buPROPion (WELLBUTRIN) 75 MG tablet Take 1 tablet (75 mg total) by mouth 2 (two) times daily. 60 tablet 0   dapagliflozin propanediol (FARXIGA) 10 MG TABS tablet Take 1 tablet (10 mg total) by mouth daily before breakfast. 90 tablet 3   digoxin (LANOXIN) 0.125 MG tablet Take 1 tablet (0.125 mg total) by mouth daily. 90 tablet 3   ergocalciferol (VITAMIN D2) 1.25 MG (50000 UT) capsule Take 1 capsule (50,000 Units total) by mouth once a week. 13 capsule 1   fluticasone (FLONASE) 50 MCG/ACT nasal spray Place 2 sprays into both nostrils daily. 16 g 0  ivabradine (CORLANOR) 7.5 MG TABS tablet Take 1 tablet by mouth 2 times daily with a meal. 60 tablet 6   metolazone (ZAROXOLYN) 2.5 MG tablet Take 1 tablet (2.5 mg total) by mouth once a week on Wednesdays with 2 extra tablets of potassium 30 tablet 1   potassium chloride SA (KLOR-CON M) 20 MEQ tablet Take 4 tablets (80 mEq total) by mouth daily. 260 tablet 3   promethazine-dextromethorphan (PROMETHAZINE-DM) 6.25-15 MG/5ML syrup Take 5 mLs by mouth 4 (four) times daily as needed for cough. 140 mL 0   Pseudoeph-Doxylamine-DM-APAP (NYQUIL PO) Take by mouth.     sacubitril-valsartan (ENTRESTO) 49-51 MG Take 1 tablet by mouth 2 (two) times daily. 180 tablet 11   spironolactone (ALDACTONE) 25 MG tablet Take 25 mg by mouth daily.     torsemide (DEMADEX) 20 MG tablet Take 2 tablets (40 mg total) by mouth 2 (two) times daily.  360 tablet 3   No current facility-administered medications for this encounter.    No Known Allergies    Social History   Socioeconomic History   Marital status: Single    Spouse name: Not on file   Number of children: Not on file   Years of education: Not on file   Highest education level: Not on file  Occupational History   Not on file  Tobacco Use   Smoking status: Every Day    Packs/day: 1.00    Years: 16.00    Total pack years: 16.00    Types: Cigarettes   Smokeless tobacco: Never  Vaping Use   Vaping Use: Some days   Substances: Nicotine  Substance and Sexual Activity   Alcohol use: Not Currently    Comment: Pt stated "2 years clean"   Drug use: Not Currently    Comment: Pt stated "It was opiates"   Sexual activity: Not on file  Other Topics Concern   Not on file  Social History Narrative   Not on file   Social Determinants of Health   Financial Resource Strain: Low Risk  (01/17/2021)   Overall Financial Resource Strain (CARDIA)    Difficulty of Paying Living Expenses: Not very hard  Food Insecurity: No Food Insecurity (01/17/2021)   Hunger Vital Sign    Worried About Running Out of Food in the Last Year: Never true    [place] in the Last Year: Never true  Transportation Needs: No Transportation Needs (01/17/2021)   PRAPARE - Hydrologist (Medical): No    Lack of Transportation (Non-Medical): No  Physical Activity: Not on file  Stress: Not on file  Social Connections: Not on file  Intimate Partner Violence: Not on file      Family History  Problem Relation Age of Onset   Hypertension Mother    Hypertension Father     Vitals:   03/31/22 1120  BP: 104/70  Pulse: 92  SpO2: 95%  Weight: (!) 178.7 kg (394 lb)     Wt Readings from Last 3 Encounters:  03/31/22 (!) 178.7 kg (394 lb)  12/31/21 (!) 173.6 kg (382 lb 12.8 oz)  10/23/21 (!) 164.2 kg (362 lb)    PHYSICAL EXAM: General:  Well appearing. No resp  difficulty HEENT: normal Neck: supple. no JVD. Carotids 2+ bilat; no bruits. No lymphadenopathy or thryomegaly appreciated. Cor: PMI nondisplaced. Regular rate & rhythm. No rubs, gallops or murmurs. Lungs: clear Abdomen: obese soft, nontender, nondistended. No hepatosplenomegaly. No bruits or masses. Good bowel sounds.  Extremities: no cyanosis, clubbing, rash, edema Neuro: alert & orientedx3, cranial nerves grossly intact. moves all 4 extremities w/o difficulty. Affect pleasant    ASSESSMENT & PLAN:  1. Chronic systolic HF - diagnosed 0000000 Echo EF 20-25% RV moderately HK - suspect HTN vs PVC-mediated (PVC burden 4.3% - probably not high enough to cause CM) - cath 1/23 no CAD EF < 10% - CPX 2/23 pVO2: 21.6 (87% predicted peak VO2) corrected for ibw pVO2 41.6 ml/kg (ibw)/min (99% of the ibw-adjusted predicted). Slope:45 O2pulse:  19 (90% pred) - Echo 09/24/21: EF 20-25% RV moderately HK - Stable NYHA II-III. CPX test reassuring VO2 but slope high - Weight continues to climb. Now up 30 pounds in 6 months.But volume looks good on exam. Continue Tosemide 40 BID + metolazone once a week. - Continue Farxiga 10 - Continue entresto 49/51 mg BID (cut back due to fatigue.) - Continue digoxin 0.125 - Continue spiro 25 - Off carvedilol due to low output. Will try Toprol 25 qhs - Continue Ivabradine 7.5 mg BID - Continue to follow for need for advanced therapies but  weight may be getting prohibitive even for VAD - Body mass index is 54.95 kg/m. - Seen by Dr. Caryl Comes. Planning for ICD implant. Will reach back out to EP regarding procedure date. - Repeat echo at next visit - Labs today  2. HTN, severe - Blood pressure well controlled. Continue current regimen.  3. Frequent PVCs - Zio 12/21 4.3% PVCs - probably not high enough burden to cause CM  - Has severe OSA. Awaiting CPAP equipment but cannot afford. Will have SW see today  4. IVDA (heroin) - reports no use since 12/20  5. Morbid  obesity - weight up 30 pounds in last 6 months - Unable to get Wegovy due to insurance not improving - Will refer to [facility]  6. Sleep apnea - Severe obstructive, moderate central - Not on CPAP yet. Cannot afford - Will ask SW to help   7. Tobacco use - Congratulated him on cutting back, now ~10 cigarettes daily.  - Discussed need for cessation    Glori Bickers, MD  11:50 AM

## 2022-03-31 ENCOUNTER — Other Ambulatory Visit (HOSPITAL_COMMUNITY): Payer: Self-pay

## 2022-03-31 ENCOUNTER — Encounter (HOSPITAL_COMMUNITY): Payer: Self-pay | Admitting: Internal Medicine

## 2022-03-31 ENCOUNTER — Ambulatory Visit (HOSPITAL_COMMUNITY)
Admission: RE | Admit: 2022-03-31 | Discharge: 2022-03-31 | Disposition: A | Discharge status: Home / Self Care | Payer: BC Managed Care – PPO | Source: Ambulatory Visit | Attending: Internal Medicine | Admitting: Internal Medicine

## 2022-03-31 VITALS — BP 104/70 | HR 92 | Wt 394.0 lb

## 2022-03-31 DIAGNOSIS — Z7984 Long term (current) use of oral hypoglycemic drugs: Secondary | ICD-10-CM | POA: Insufficient documentation

## 2022-03-31 DIAGNOSIS — Z79899 Other long term (current) drug therapy: Secondary | ICD-10-CM | POA: Insufficient documentation

## 2022-03-31 DIAGNOSIS — F17210 Nicotine dependence, cigarettes, uncomplicated: Secondary | ICD-10-CM | POA: Insufficient documentation

## 2022-03-31 DIAGNOSIS — G4733 Obstructive sleep apnea (adult) (pediatric): Secondary | ICD-10-CM

## 2022-03-31 DIAGNOSIS — I110 Hypertensive heart disease with heart failure: Secondary | ICD-10-CM | POA: Diagnosis not present

## 2022-03-31 DIAGNOSIS — I493 Ventricular premature depolarization: Secondary | ICD-10-CM | POA: Diagnosis not present

## 2022-03-31 DIAGNOSIS — Z6841 Body Mass Index (BMI) 40.0 and over, adult: Secondary | ICD-10-CM

## 2022-03-31 DIAGNOSIS — I5022 Chronic systolic (congestive) heart failure: Secondary | ICD-10-CM

## 2022-03-31 DIAGNOSIS — R0609 Other forms of dyspnea: Secondary | ICD-10-CM | POA: Diagnosis not present

## 2022-03-31 DIAGNOSIS — Z720 Tobacco use: Secondary | ICD-10-CM | POA: Diagnosis not present

## 2022-03-31 DIAGNOSIS — F1111 Opioid abuse, in remission: Secondary | ICD-10-CM | POA: Diagnosis not present

## 2022-03-31 LAB — BRAIN NATRIURETIC PEPTIDE: B Natriuretic Peptide: 34.4 pg/mL (ref 0.0–100.0)

## 2022-03-31 MED ORDER — METOPROLOL SUCCINATE ER 25 MG PO TB24
25.0000 mg | ORAL_TABLET | Freq: Every day | ORAL | 6 refills | Status: DC
  Filled 2022-03-31 – 2022-11-18 (×4): qty 30, 30d supply, fill #0

## 2022-03-31 NOTE — Patient Instructions (Signed)
Medication Changes:  START Metoprolol XL 25 mg Daily AT BEDTIME  Lab Work:  Labs done today, your results will be available in MyChart, we will contact you for abnormal readings.  Testing/Procedures:  Your physician has requested that you have an echocardiogram. Echocardiography is a painless test that uses sound waves to create images of your heart. It provides your doctor with information about the size and shape of your heart and how well your heart's chambers and valves are working. This procedure takes approximately one hour. There are no restrictions for this procedure. Please do NOT wear cologne, perfume, aftershave, or lotions (deodorant is allowed). Please arrive 15 minutes prior to your appointment time. IN 3 MONTHS  Referrals:  You have been referred to [facility], they will call you for an appointment  Special Instructions // Education:  Do the following things EVERYDAY: Weigh yourself in the morning before breakfast. Write it down and keep it in a log. Take your medicines as prescribed Eat low salt foods--Limit salt (sodium) to 2000 mg per day.  Stay as active as you can everyday Limit all fluids for the day to less than 2 liters  Follow-Up in: 3 months with echocardiogram (February 2024), **PLEASE CALL OUR OFFICE IN DECEMBER TO SCHEDULE THIS APPOINTMENT  At [facility], you and your health needs are our priority. We have a designated team specialized in the treatment of Heart Failure. This Care Team includes your primary Heart Failure Specialized Cardiologist (physician), Advanced Practice Providers (APPs- Physician Assistants and Nurse Practitioners), and Pharmacist who all work together to provide you with the care you need, when you need it.   You may see any of the following providers on your designated Care Team at your next follow up:  Dr. Glori Bickers Dr. Loralie Champagne Dr. Roxana Hires, NP Lyda Jester, [facility] [place], [place] Forestine Na, NP Audry Riles, PharmD   Please be sure to bring in all your medications bottles to every appointment.   Need to Contact us:  If you have any questions or concerns before your next appointment please send [place] a message through [place] or call our office at [phone].    TO LEAVE A MESSAGE FOR THE NURSE SELECT OPTION 2, PLEASE LEAVE A MESSAGE INCLUDING: YOUR NAME DATE OF BIRTH CALL BACK NUMBER REASON FOR CALL**this is important as we prioritize the call backs  YOU WILL RECEIVE A CALL BACK THE SAME DAY AS LONG AS YOU CALL BEFORE 4:00 PM

## 2022-04-07 ENCOUNTER — Encounter (INDEPENDENT_AMBULATORY_CARE_PROVIDER_SITE_OTHER): Payer: Self-pay

## 2022-04-07 NOTE — Telephone Encounter (Signed)
DME switched to [facility].  Upon patient request DME selection is [facility], [facility]. Patient understands he will be contacted by [facility] to set up his cpap. Patient understands to call if [facility] does not contact him with new setup in a timely manner. Patient understands they will be called once confirmation has been received from [facility]/ that they have received their new machine to schedule 10 week follow up appointment.   [facility] notified of new cpap order  Please add to airview Patient was grateful for the call and thanked me.

## 2022-04-08 ENCOUNTER — Telehealth (HOSPITAL_COMMUNITY): Payer: Self-pay | Admitting: Licensed Clinical Social Worker

## 2022-04-08 NOTE — Telephone Encounter (Signed)
H&V Care Navigation CSW Progress Note  Clinical Social Worker contacted patient by phone to discuss options for obtaining his CPAP.  Patient is participating in a Managed Medicaid Plan:  no  Patient is a 36 yo male who is working full time and admits to financial concerns. Patient has insurance through his employer although has not met his out of pocket expenses and has a high cost to get his CPAP. He states initially the order for the equipment was sent to [facility] who could not provide the equipment as patient owed a high co pay and unable to afford. The company is apparently no longer working with DME and the order for CPAP has been referred to [facility] for further assistance. CSW contacted Darlina Guys at [employer] [phone] and left message for return call to discuss out of pocket and possible financial assistance through our Patient Care fund. CSW awaiting return call. [name], [place], [place]   SDOH Screenings   Food Insecurity: No Food Insecurity (01/17/2021)  Housing: Low Risk  (01/17/2021)  Transportation Needs: No Transportation Needs (01/17/2021)  Financial Resource Strain: High Risk (04/08/2022)  Tobacco Use: High Risk (03/31/2022)

## 2022-04-09 ENCOUNTER — Telehealth (HOSPITAL_COMMUNITY): Payer: Self-pay | Admitting: Licensed Clinical Social Worker

## 2022-04-09 NOTE — Telephone Encounter (Addendum)
H&V Care Navigation CSW Progress Note  Clinical Social Worker  contacted patient  to discuss possible eligibility for Patient Assistance through [facility] to obtain his CPAP.  Patient is participating in a Managed Medicaid Plan:  No  Patient works full time and has insurance although it is very limited and his has close to $1,800 out of pocket costs associated with his CPAP machine. CSW contacted [employer] and have the application for for Patient Assistance program. CSW will meet with patient when he is available to complete and submit. Waiting on return call from patient with availability. Lasandra Beech, LCSW, CCSW-MCS [phone]   SDOH Screenings   Food Insecurity: No Food Insecurity (01/17/2021)  Housing: Low Risk  (01/17/2021)  Transportation Needs: No Transportation Needs (01/17/2021)  Financial Resource Strain: High Risk (04/08/2022)  Tobacco Use: High Risk (03/31/2022)

## 2022-04-10 ENCOUNTER — Other Ambulatory Visit (HOSPITAL_COMMUNITY): Payer: Self-pay

## 2022-04-13 ENCOUNTER — Telehealth (HOSPITAL_COMMUNITY): Payer: Self-pay | Admitting: Licensed Clinical Social Worker

## 2022-04-13 NOTE — Telephone Encounter (Signed)
CSW reached out to patient to discuss Patient Assistance from [facility]. CSW reviewed needed documents and signature on application. Patient will gather information and come to clinic on Wednesday April 15, 2022 to complete. Lasandra Beech, LCSW, CCSW-MCS [phone]

## 2022-04-28 ENCOUNTER — Telehealth (HOSPITAL_COMMUNITY): Payer: Self-pay | Admitting: Licensed Clinical Social Worker

## 2022-04-28 NOTE — Telephone Encounter (Signed)
CSW contacted patient to follow up on [facility] patient Assistance application as patient did not show for his appointment in the clinic. CSW left message for return call and will mail paperwork for patient to complete. Lasandra Beech, LCSW, CCSW-MCS [phone]

## 2022-05-06 IMAGING — DX DG CHEST 2V
2 series · 2 of 2 positions shown · non-contrast
Comparison: 08/15/2020

CLINICAL DATA: Chest pain

EXAM:
CHEST - 2 VIEW

[chest pa]
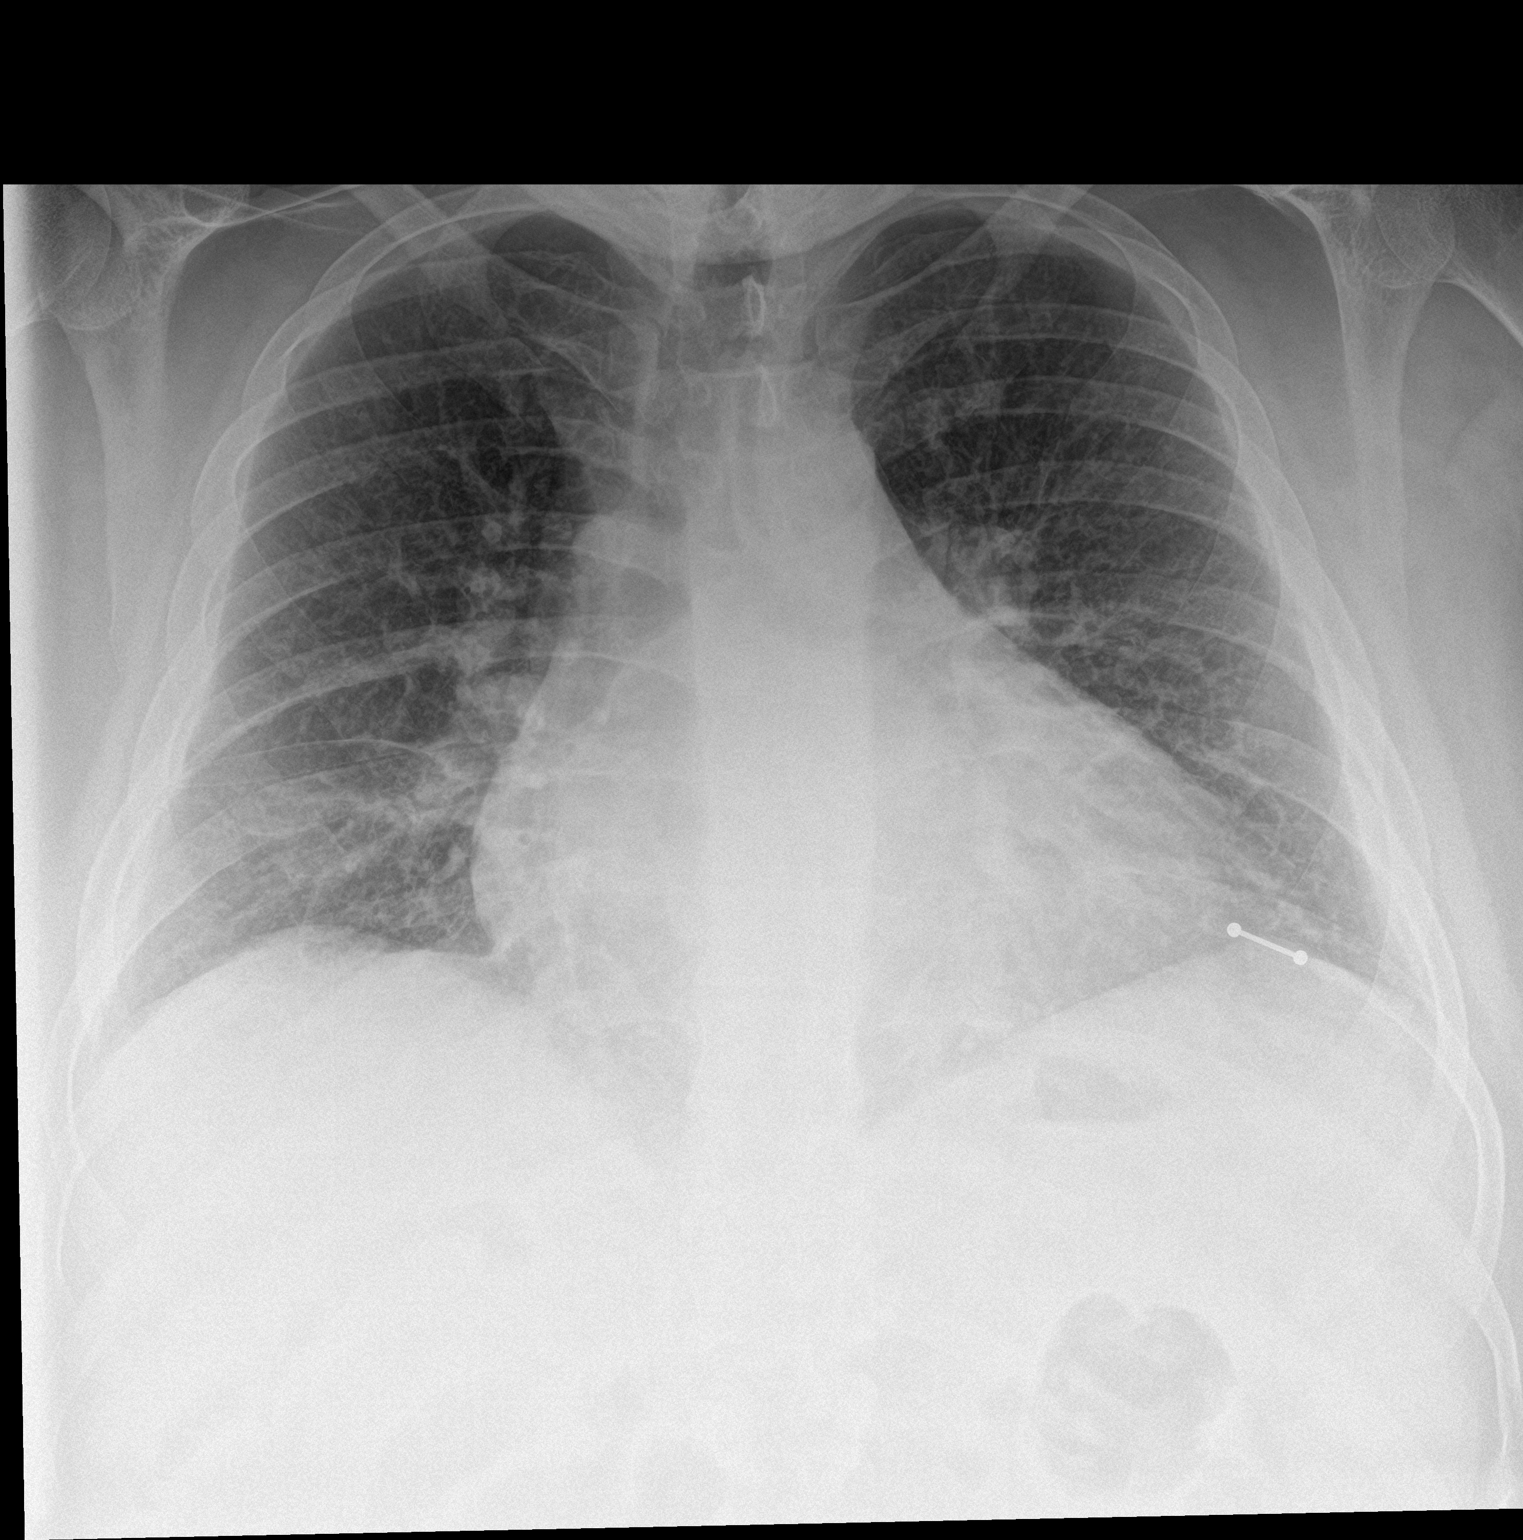

[chest lat]
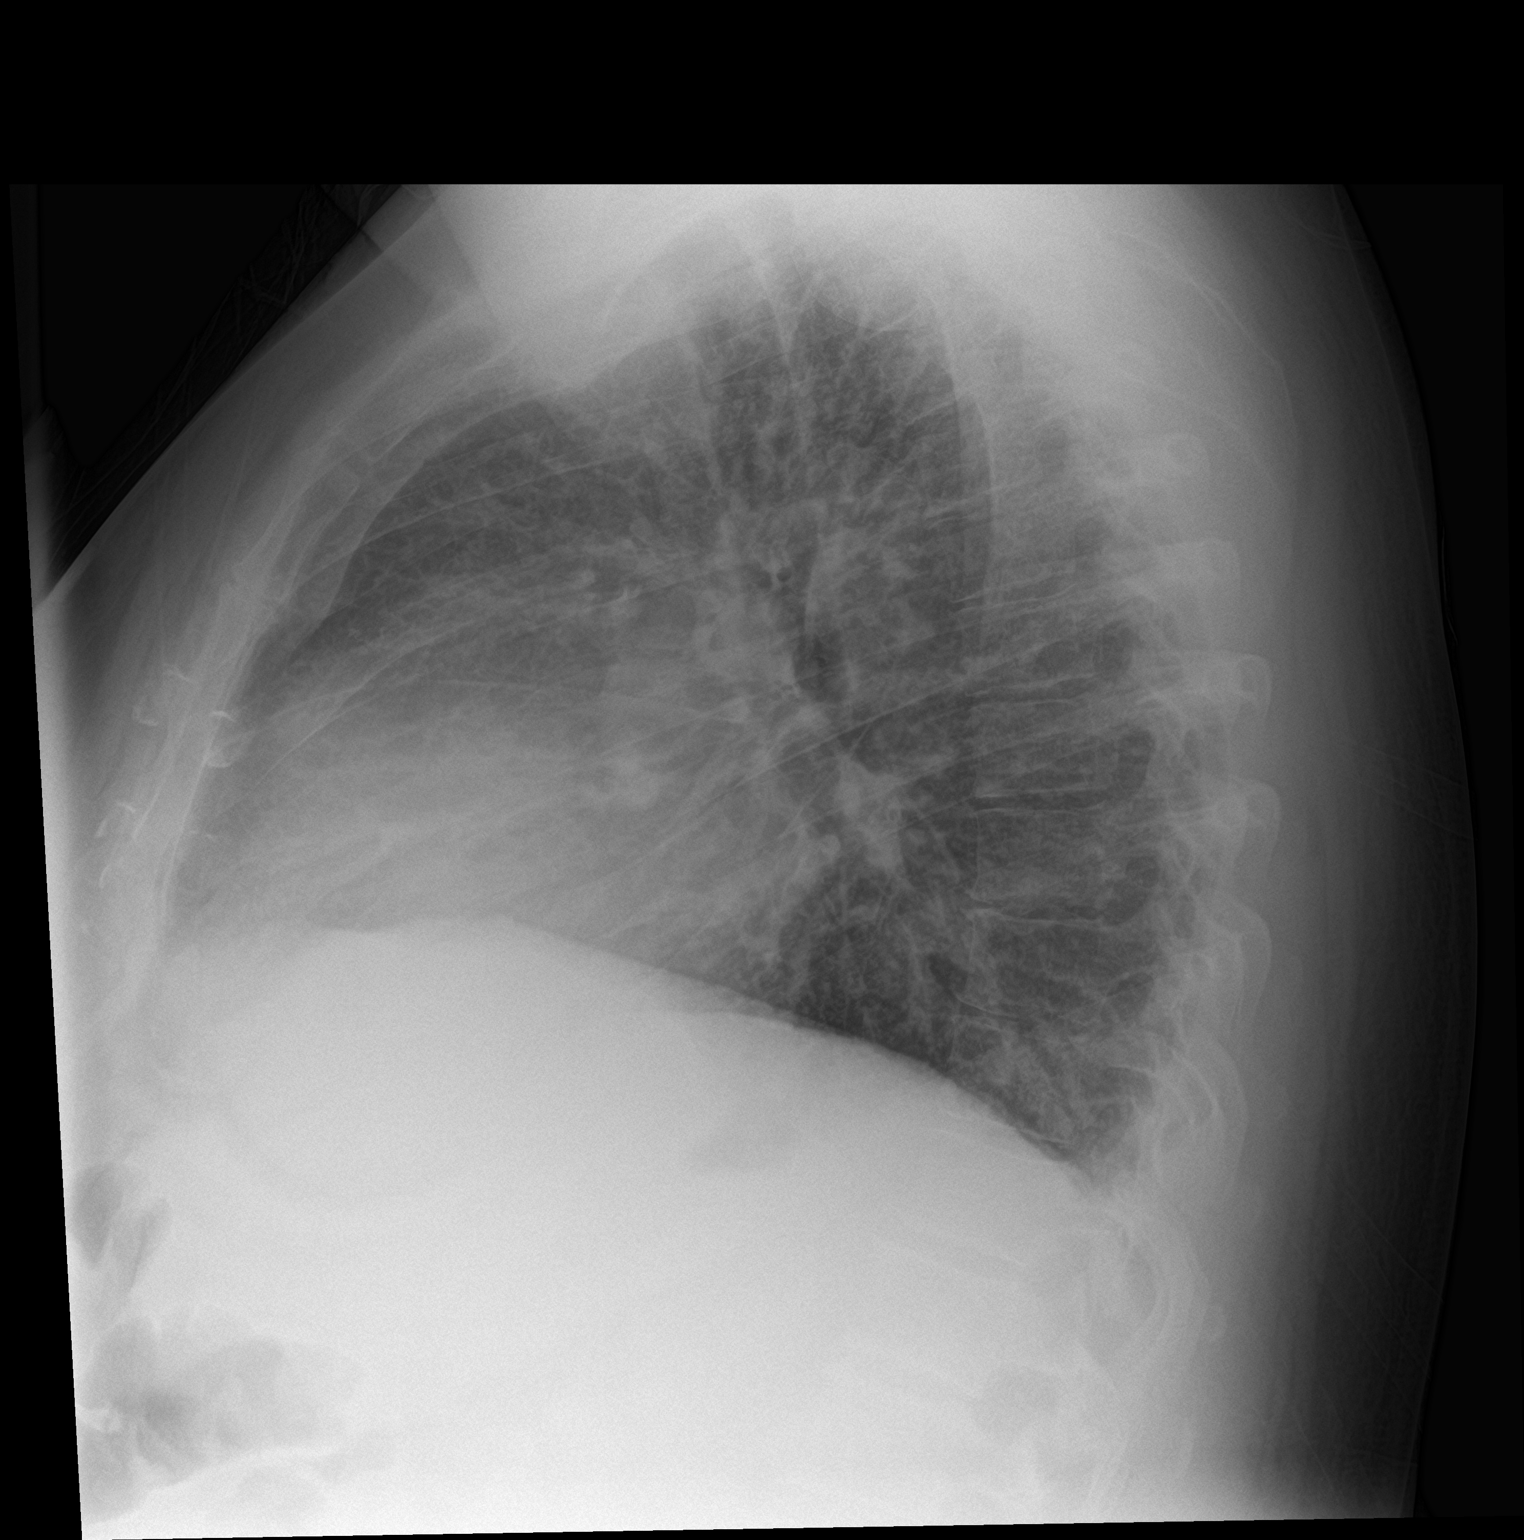

[2 of 2 positions shown; findings below may reference images not displayed]

FINDINGS: Cardiomegaly. Low volume chest. Interstitial coarsening which is
chronic. No visible effusion or pneumothorax.
IMPRESSION: Chronic cardiomegaly and vascular congestion.

## 2022-05-11 ENCOUNTER — Telehealth: Payer: Self-pay | Admitting: Pharmacist

## 2022-05-11 NOTE — Telephone Encounter (Addendum)
Pt previously referred to PharmD by CHF clinic to start [facility] therapy. Request deferred due to national shortage of [address]. Med still on intermittent backorder with no end date.   Will try submitting prior authorization to see if med is covered by pt's insurance, key [id number].

## 2022-05-11 NOTE — Telephone Encounter (Signed)
Prior authorization denied - pt's plan does not cover weight loss drugs. Called pt to let him know, he was appreciative for the call.

## 2022-05-27 ENCOUNTER — Other Ambulatory Visit (HOSPITAL_COMMUNITY): Payer: Self-pay

## 2022-05-27 MED ORDER — AMOXICILLIN 500 MG PO CAPS
500.0000 mg | ORAL_CAPSULE | Freq: Three times a day (TID) | ORAL | 0 refills | Status: DC
  Filled 2022-05-27: qty 21, 7d supply, fill #0

## 2022-06-24 ENCOUNTER — Encounter: Payer: BC Managed Care – PPO | Admitting: Nurse Practitioner

## 2022-06-24 NOTE — Progress Notes (Deleted)
Office: [phone]  /  Fax: [phone]   Initial Visit  Donald Murray was seen in clinic today to evaluate for obesity. He is interested in losing weight to improve overall health and reduce the risk of weight related complications. He presents today to review program treatment options, initial physical assessment, and evaluation.     He was referred by: Specialist  Patient has a PMH of PVCs, HTN, OSAS, HTN, previous IVDA (heroin-clean since 04/2019), tobacco use and systolic HF (onset 0000000)   When asked what else they would like to accomplish? He states: {EMHopetoaccomplish:28304}  When asked how has your weight affected you? He states: {EMWeightAffected:28305}  Some associated conditions: {EMSomeConditions:28306}  Contributing factors: {EMcontributingfactors:28307}  Weight promoting medications identified: {EMWeightpromotingrx:28308}  Current nutrition plan: {EMNutritionplan:28309::"None"}  Current level of physical activity: {EMcurrentPA:28310::"None"}  Current or previous pharmacotherapy: {EM previousRx:28311}  Response to medication: {EMResponsetomedication:28312}   Past medical history includes:   Past Medical History:  Diagnosis Date   CHF (congestive heart failure) (HCC)    Hypertension    Obesity    Snoring      Objective:   There were no vitals taken for this visit. He was weighed on the bioimpedance scale: There is no height or weight on file to calculate BMI.  Peak Weight:*** , Body Fat%:***, Visceral Fat Rating:***, Weight trend over the last 12 months: {emweighttrend:28333}  General:  Alert, oriented and cooperative. Patient is in no acute distress.  Respiratory: Normal respiratory effort, no problems with respiration noted  Extremities: Normal range of motion.    Mental Status: Normal mood and affect. Normal behavior. Normal judgment and thought content.   DIAGNOSTIC DATA REVIEWED:  BMET    Component Value Date/Time   NA 137 12/31/2021 1536    NA 138 10/23/2021 1536   K 3.2 (L) 12/31/2021 1536   CL 102 12/31/2021 1536   CO2 26 12/31/2021 1536   GLUCOSE 95 12/31/2021 1536   BUN 13 12/31/2021 1536   BUN 11 10/23/2021 1536   CREATININE 0.93 12/31/2021 1536   CALCIUM 9.2 12/31/2021 1536   GFRNONAA >60 12/31/2021 1536   GFRAA 133 03/20/2020 1210   No results found for: "HGBA1C" No results found for: "INSULIN" CBC    Component Value Date/Time   WBC 9.7 07/09/2021 1044   RBC 5.76 07/09/2021 1044   HGB 15.4 07/09/2021 1044   HCT 47.4 07/09/2021 1044   PLT 290 07/09/2021 1044   MCV 82.3 07/09/2021 1044   MCH 26.7 07/09/2021 1044   MCHC 32.5 07/09/2021 1044   RDW 16.0 (H) 07/09/2021 1044   Iron/TIBC/Ferritin/ %Sat No results found for: "IRON", "TIBC", "FERRITIN", "IRONPCTSAT" Lipid Panel  No results found for: "CHOL", "TRIG", "HDL", "CHOLHDL", "VLDL", "LDLCALC", "LDLDIRECT" Hepatic Function Panel     Component Value Date/Time   PROT 6.9 12/31/2021 1536   ALBUMIN 3.2 (L) 12/31/2021 1536   AST 18 12/31/2021 1536   ALT 19 12/31/2021 1536   ALKPHOS 53 12/31/2021 1536   BILITOT 0.3 12/31/2021 1536      Component Value Date/Time   TSH 1.490 03/06/2020 1207     Assessment and Plan:  There are no diagnoses linked to this encounter.      Obesity Treatment / Action Plan:  {EMobesityactionplanscribe:28314::"Patient will work on garnering support from family and friends to begin weight loss journey.","Will work on eliminating or reducing the presence of highly palatable, calorie dense foods in the home.","Will complete provided nutritional and psychosocial assessment questionnaire before the next appointment.","Will be scheduled for indirect  calorimetry to determine resting energy expenditure in a fasting state.  This will allow [place] to create a reduced calorie, high-protein meal plan to promote loss of fat mass while preserving muscle mass."}  Obesity Education Performed Today:  He was weighed on the bioimpedance scale  and results were discussed and documented in the synopsis.  We discussed obesity as a disease and the importance of a more detailed evaluation of all the factors contributing to the disease.  We discussed the importance of long term lifestyle changes which include nutrition, exercise and behavioral modifications as well as the importance of customizing this to his specific health and social needs.  We discussed the benefits of reaching a healthier weight to alleviate the symptoms of existing conditions and reduce the risks of the biomechanical, metabolic and psychological effects of obesity.  Donald Murray appears to be in the action stage of change and states they are ready to start intensive lifestyle modifications and behavioral modifications.  *** minutes was spent today on this visit including the above counseling, pre-visit chart review, and post-visit documentation.  Reviewed by clinician on day of visit: allergies, medications, problem list, medical history, surgical history, family history, social history, and previous encounter notes.    I have reviewed the above documentation for accuracy and completeness, and I agree with the above. ***

## 2022-07-05 ENCOUNTER — Other Ambulatory Visit: Payer: Self-pay

## 2022-07-05 ENCOUNTER — Encounter (HOSPITAL_BASED_OUTPATIENT_CLINIC_OR_DEPARTMENT_OTHER): Payer: Self-pay | Admitting: Emergency Medicine

## 2022-07-05 DIAGNOSIS — J101 Influenza due to other identified influenza virus with other respiratory manifestations: Secondary | ICD-10-CM | POA: Diagnosis not present

## 2022-07-05 DIAGNOSIS — I110 Hypertensive heart disease with heart failure: Secondary | ICD-10-CM | POA: Diagnosis not present

## 2022-07-05 DIAGNOSIS — R0602 Shortness of breath: Secondary | ICD-10-CM | POA: Diagnosis present

## 2022-07-05 DIAGNOSIS — I509 Heart failure, unspecified: Secondary | ICD-10-CM | POA: Insufficient documentation

## 2022-07-05 DIAGNOSIS — Z20822 Contact with and (suspected) exposure to covid-19: Secondary | ICD-10-CM | POA: Insufficient documentation

## 2022-07-05 DIAGNOSIS — Z79899 Other long term (current) drug therapy: Secondary | ICD-10-CM | POA: Diagnosis not present

## 2022-07-05 LAB — BASIC METABOLIC PANEL
Anion gap: 12 (ref 5–15)
BUN: 20 mg/dL (ref 6–20)
CO2: 22 mmol/L (ref 22–32)
Calcium: 9.5 mg/dL (ref 8.9–10.3)
Chloride: 103 mmol/L (ref 98–111)
Creatinine, Ser: 1.19 mg/dL (ref 0.61–1.24)
GFR, Estimated: >60 mL/min (ref >60)
Glucose, Bld: 101 mg/dL — ABNORMAL HIGH (ref 70–99)
Potassium: 3.4 mmol/L — ABNORMAL LOW (ref 3.5–5.1)
Sodium: 137 mmol/L (ref 135–145)

## 2022-07-05 LAB — RESP PANEL BY RT-PCR (RSV, FLU A&B, COVID)  RVPGX2
Influenza A by PCR: POSITIVE — AB
Influenza B by PCR: NEGATIVE
Resp Syncytial Virus by PCR: NEGATIVE
SARS Coronavirus 2 by RT PCR: NEGATIVE

## 2022-07-05 LAB — CBC
HCT: 47.1 % (ref 39.0–52.0)
Hemoglobin: 15 g/dL (ref 13.0–17.0)
MCH: 25.2 pg — ABNORMAL LOW (ref 26.0–34.0)
MCHC: 31.8 g/dL (ref 30.0–36.0)
MCV: 79 fL — ABNORMAL LOW (ref 80.0–100.0)
Platelets: 290 10*3/uL (ref 150–400)
RBC: 5.96 MIL/uL — ABNORMAL HIGH (ref 4.22–5.81)
RDW: 18.9 % — ABNORMAL HIGH (ref 11.5–15.5)
WBC: 7.8 10*3/uL (ref 4.0–10.5)
nRBC: 0 % (ref 0.0–0.2)

## 2022-07-05 LAB — BRAIN NATRIURETIC PEPTIDE: B Natriuretic Peptide: 223.4 pg/mL — ABNORMAL HIGH (ref 0.0–100.0)

## 2022-07-05 NOTE — ED Triage Notes (Signed)
Pt from home with sob since Friday. Pt has heart failure and believes this is a flare. He reports no energy and sob constantly. Pt alert & oriented, nad noted.

## 2022-07-06 ENCOUNTER — Emergency Department (HOSPITAL_BASED_OUTPATIENT_CLINIC_OR_DEPARTMENT_OTHER)
Admission: EM | Admit: 2022-07-06 | Discharge: 2022-07-06 | Disposition: A | Discharge status: Home / Self Care | Payer: BC Managed Care – PPO | Attending: Emergency Medicine | Admitting: Emergency Medicine

## 2022-07-06 ENCOUNTER — Emergency Department (HOSPITAL_BASED_OUTPATIENT_CLINIC_OR_DEPARTMENT_OTHER): Payer: BC Managed Care – PPO

## 2022-07-06 DIAGNOSIS — Z8679 Personal history of other diseases of the circulatory system: Secondary | ICD-10-CM

## 2022-07-06 DIAGNOSIS — R0602 Shortness of breath: Secondary | ICD-10-CM

## 2022-07-06 DIAGNOSIS — J111 Influenza due to unidentified influenza virus with other respiratory manifestations: Secondary | ICD-10-CM

## 2022-07-06 MED ORDER — ACETAMINOPHEN 500 MG PO TABS
1000.0000 mg | ORAL_TABLET | Freq: Once | ORAL | Status: AC
  Administered 2022-07-06: 1000 mg via ORAL
  Filled 2022-07-06: qty 2

## 2022-07-06 MED ORDER — OSELTAMIVIR PHOSPHATE 75 MG PO CAPS: 75.0000 mg | ORAL_CAPSULE | Freq: Two times a day (BID) | ORAL | 0 refills | Status: DC

## 2022-07-06 MED ORDER — FUROSEMIDE 10 MG/ML IJ SOLN
40.0000 mg | Freq: Once | INTRAMUSCULAR | Status: AC
Start: 2022-07-06 — End: 2022-07-06
  Administered 2022-07-06: 40 mg via INTRAVENOUS
  Filled 2022-07-06 (×2): qty 4

## 2022-07-06 MED ORDER — OSELTAMIVIR PHOSPHATE 75 MG PO CAPS
75.0000 mg | ORAL_CAPSULE | Freq: Once | ORAL | Status: AC
  Administered 2022-07-06: 75 mg via ORAL
  Filled 2022-07-06 (×2): qty 1

## 2022-07-06 NOTE — ED Provider Notes (Signed)
[facility] Provider Note   CSN: [id number] Arrival date & time: 07/05/22  2147     History  Chief Complaint  Patient presents with   Shortness of Breath    Micahel Murray is a 37 y.o. male.  HPI     This is a 37 year old male with a history of CHF and morbid obesity who presents with shortness of breath.  Patient reports he has had increasing shortness of breath since Friday.  He states that he has increasing dyspnea on exertion.  No orthopnea.  He questions whether he may have increasing heart failure.  No recent changes in his Lasix.  He does not feel like he has more swelling.  Denies chest pain.  Generally has not felt well all weekend.  No fevers or chills.  Home Medications Prior to Admission medications   Medication Sig Start Date End Date Taking? Authorizing Provider  oseltamivir (TAMIFLU) 75 MG capsule Take 1 capsule (75 mg total) by mouth every 12 (twelve) hours. 07/06/22  Yes Apphia Cropley, Barbette Hair, MD  amoxicillin (AMOXIL) 500 MG capsule Take 1 capsule (500 mg total) by mouth 3 (three) times daily until gone 05/26/22     buPROPion (WELLBUTRIN) 75 MG tablet Take 1 tablet (75 mg total) by mouth 2 (two) times daily. 02/26/22     dapagliflozin propanediol (FARXIGA) 10 MG TABS tablet Take 1 tablet (10 mg total) by mouth daily before breakfast. 06/20/21   [place], Maricela Bo, FNP  digoxin (LANOXIN) 0.125 MG tablet Take 1 tablet (0.125 mg total) by mouth daily. 08/01/21   Bensimhon, Shaune Pascal, MD  ergocalciferol (VITAMIN D2) 1.25 MG (50000 UT) capsule Take 1 capsule (50,000 Units total) by mouth once a week. 03/13/22     fluticasone (FLONASE) 50 MCG/ACT nasal spray Place 2 sprays into both nostrils daily. 03/10/22   Leath-Warren, Alda Lea, NP  ivabradine (CORLANOR) 7.5 MG TABS tablet Take 1 tablet by mouth 2 times daily with a meal. 09/24/21   Bensimhon, Shaune Pascal, MD  metolazone (ZAROXOLYN) 2.5 MG tablet Take 1 tablet (2.5 mg total) by mouth once  a week on Wednesdays with 2 extra tablets of potassium 07/09/21   Bensimhon, Shaune Pascal, MD  metoprolol succinate (TOPROL XL) 25 MG 24 hr tablet Take 1 tablet (25 mg total) by mouth at bedtime. 03/31/22   Bensimhon, Shaune Pascal, MD  potassium chloride SA (KLOR-CON M) 20 MEQ tablet Take 4 tablets (80 mEq total) by mouth daily. 09/24/21   Bensimhon, Shaune Pascal, MD  promethazine-dextromethorphan (PROMETHAZINE-DM) 6.25-15 MG/5ML syrup Take 5 mLs by mouth 4 (four) times daily as needed for cough. 03/10/22   Leath-Warren, Alda Lea, NP  Pseudoeph-Doxylamine-DM-APAP (NYQUIL PO) Take by mouth.    [provider]  sacubitril-valsartan (ENTRESTO) 49-51 MG Take 1 tablet by mouth 2 (two) times daily. 12/31/21   Bensimhon, Shaune Pascal, MD  spironolactone (ALDACTONE) 25 MG tablet Take 25 mg by mouth daily.    [provider]  torsemide (DEMADEX) 20 MG tablet Take 2 tablets (40 mg total) by mouth 2 (two) times daily. 06/30/21 06/09/22  Bensimhon, Shaune Pascal, MD      Allergies    Patient has no known allergies.    Review of Systems   Review of Systems  Constitutional:  Negative for fever.  Respiratory:  Positive for cough and shortness of breath.   Cardiovascular:  Negative for chest pain.  Gastrointestinal:  Negative for abdominal pain.  All other systems reviewed and are negative.  Physical Exam Updated Vital Signs BP 114/88   Pulse (!) 102   Temp 99.7 F (37.6 C) (Oral)   Resp 18   Ht 1.803 m (5' 11"$ )   Wt (!) 163.3 kg   SpO2 93%   BMI 50.21 kg/m  Physical Exam Vitals and nursing note reviewed.  Constitutional:      Appearance: He is well-developed. He is obese. He is not ill-appearing.  HENT:     Head: Normocephalic and atraumatic.  Eyes:     Pupils: Pupils are equal, round, and reactive to light.  Cardiovascular:     Rate and Rhythm: Regular rhythm. Tachycardia present.     Heart sounds: Normal heart sounds. No murmur heard. Pulmonary:     Effort: Pulmonary effort is normal. No  respiratory distress.     Breath sounds: Normal breath sounds. No wheezing.     Comments: Limited secondary to body habitus Abdominal:     General: Bowel sounds are normal.     Palpations: Abdomen is soft.     Tenderness: There is no abdominal tenderness. There is no rebound.  Musculoskeletal:     Cervical back: Neck supple.     Comments: Trace bilateral lower extremity edema.  Lymphadenopathy:     Cervical: No cervical adenopathy.  Skin:    General: Skin is warm and dry.  Neurological:     Mental Status: He is alert and oriented to person, place, and time.  Psychiatric:        Mood and Affect: Mood normal.     ED Results / Procedures / Treatments   Labs (all labs ordered are listed, but only abnormal results are displayed) Labs Reviewed  RESP PANEL BY RT-PCR (RSV, FLU A&B, COVID)  RVPGX2 - Abnormal; Notable for the following components:      Result Value   Influenza A by PCR POSITIVE (*)    All other components within normal limits  BASIC METABOLIC PANEL - Abnormal; Notable for the following components:   Potassium 3.4 (*)    Glucose, Bld 101 (*)    All other components within normal limits  CBC - Abnormal; Notable for the following components:   RBC 5.96 (*)    MCV 79.0 (*)    MCH 25.2 (*)    RDW 18.9 (*)    All other components within normal limits  BRAIN NATRIURETIC PEPTIDE - Abnormal; Notable for the following components:   B Natriuretic Peptide 223.4 (*)    All other components within normal limits    EKG EKG Interpretation  Date/Time:  Sunday July 05 2022 21:59:56 EST Ventricular Rate:  127 PR Interval:  136 QRS Duration: 110 QT Interval:  314 QTC Calculation: 456 R Axis:   -63 Text Interpretation: Sinus tachycardia Pulmonary disease pattern Left anterior fascicular block Abnormal ECG When compared with ECG of 31-Mar-2022 11:33, No significant change was found Since last tracing rate faster Confirmed by Thayer Jew [id number]) on 07/06/2022 4:40:33  AM  Radiology DG Chest Port 1 View  Result Date: 07/06/2022 CLINICAL DATA:  Shortness of breath. EXAM: PORTABLE CHEST 1 VIEW COMPARISON:  Chest radiograph dated 05/21/2021. FINDINGS: There is mild cardiomegaly with mild vascular congestion. No focal consolidation, pleural effusion, or pneumothorax. No acute osseous pathology. IMPRESSION: Mild cardiomegaly with mild vascular congestion. Electronically Signed   By: Anner Crete M.D.   On: 07/06/2022 01:08    Procedures .Critical Care  Performed by: Merryl Hacker, MD Authorized by: Merryl Hacker, MD   Critical care  provider statement:    Critical care time (minutes):  31   Critical care was necessary to treat or prevent imminent or life-threatening deterioration of the following conditions:  Respiratory failure (Diuresis, multiple rechecks)   Critical care was time spent personally by me on the following activities:  Development of treatment plan with patient or surrogate, discussions with consultants, evaluation of patient's response to treatment, examination of patient, ordering and review of laboratory studies, ordering and review of radiographic studies, ordering and performing treatments and interventions, pulse oximetry, re-evaluation of patient's condition and review of old charts     Medications Ordered in ED Medications  oseltamivir (TAMIFLU) capsule 75 mg (75 mg Oral Given 07/06/22 0131)  furosemide (LASIX) injection 40 mg (40 mg Intravenous Given 07/06/22 0206)  acetaminophen (TYLENOL) tablet 1,000 mg (1,000 mg Oral Given 07/06/22 0224)    ED Course/ Medical Decision Making/ A&P Clinical Course as of 07/06/22 0440  Mon Jul 06, 2022  0330 On recheck, patient notes heart rates down trended to 107 after Tylenol.  He is diuresing nicely.  He is diaphoretic.  Denies chest pain.  States that he has felt chilled and sweaty.  Suspect he has had a fever that has been driving his tachycardia.  Most recent temperature 99.7  orally. [CH]    Clinical Course User Index [CH] Patsy Zaragoza, Barbette Hair, MD                             Medical Decision Making Amount and/or Complexity of Data Reviewed Labs: ordered.  Risk OTC drugs. Prescription drug management.   This patient presents to the ED for concern of shortness of breath, this involves an extensive number of treatment options, and is a complaint that carries with it a high risk of complications and morbidity.  I considered the following differential and admission for this acute, potentially life threatening condition.  The differential diagnosis includes pneumonia, pneumothorax, CHF, viral infection such as COVID or influenza  MDM:    This is a 37 year old male with history of CHF who presents with shortness of breath.  He is afebrile but tachycardic.  He is not in any respiratory distress.  Not obviously overtly volume overloaded.  EKG shows sinus tachycardia.  No ischemic changes.  He is not having any chest pain.  Labs notable for positive influenza.  Chest x-ray shows some mild pulmonary congestion and a BNP of 223.  Suspect that primarily his symptoms are related to acute influenza; however, mild CHF is also consideration.  I also suspect he is likely febrile.  He was noted to be diaphoretic and persistently tachycardic.  He was given Tylenol and his heart rate down trended significantly.  He was able to ambulate maintain his pulse ox.  States he feels better.  He was given 1 dose of IV Lasix to diurese for any potential component of CHF contributing to his shortness of breath.  He was also given a dose of Tamiflu given his risk factors.  No evidence of pneumonia on chest x-ray.  Vital signs improved.  Discussed with patient supportive measures at home.  (Labs, imaging, consults)  Labs: I Ordered, and personally interpreted labs.  The pertinent results include: CBC, BMP, BNP, COVID and influenza testing  Imaging Studies ordered: I ordered imaging studies  including chest x-ray I independently visualized and interpreted imaging. I agree with the radiologist interpretation  Additional history obtained from chart review.  External records from outside  source obtained and reviewed including prior evaluations  Cardiac Monitoring: The patient was maintained on a cardiac monitor.  If on the cardiac monitor, I personally viewed and interpreted the cardiac monitored which showed an underlying rhythm of: Sinus tachycardia  Reevaluation: After the interventions noted above, I reevaluated the patient and found that they have :improved  Social Determinants of Health:  lives independently  Disposition: Discharge  Co morbidities that complicate the patient evaluation  Past Medical History:  Diagnosis Date   CHF (congestive heart failure) (HCC)    Hypertension    Obesity    Snoring      Medicines Meds ordered this encounter  Medications   oseltamivir (TAMIFLU) capsule 75 mg   furosemide (LASIX) injection 40 mg   acetaminophen (TYLENOL) tablet 1,000 mg   oseltamivir (TAMIFLU) 75 MG capsule    Sig: Take 1 capsule (75 mg total) by mouth every 12 (twelve) hours.    Dispense:  10 capsule    Refill:  0    I have reviewed the patients home medicines and have made adjustments as needed  Problem List / ED Course: Problem List Items Addressed This Visit   None Visit Diagnoses     SOB (shortness of breath)    -  Primary   Influenza       Relevant Medications   oseltamivir (TAMIFLU) capsule 75 mg (Completed)   oseltamivir (TAMIFLU) 75 MG capsule   History of heart failure                       Final Clinical Impression(s) / ED Diagnoses Final diagnoses:  SOB (shortness of breath)  Influenza  History of heart failure    Rx / DC Orders ED Discharge Orders          Ordered    oseltamivir (TAMIFLU) 75 MG capsule  Every 12 hours        07/06/22 0439              Merryl Hacker, MD 07/06/22 [phone]

## 2022-07-06 NOTE — ED Notes (Signed)
Pt ambulated around nurses station, O2 stayed 95-96% on RA and HR remained in the 120s. Md notified.

## 2022-07-06 NOTE — Discharge Instructions (Signed)
You were seen today for shortness of breath.  This is likely related to influenza.  However, you may have some effects of volume overload.  Continue your Lasix as prescribed.  You will be given a prescription of Tamiflu given your medical comorbidities.  If you have any new or worsening symptoms, you should be reevaluated.  Take Tylenol or Motrin for any fevers.

## 2022-07-21 ENCOUNTER — Telehealth (HOSPITAL_COMMUNITY): Payer: Self-pay

## 2022-07-21 NOTE — Telephone Encounter (Signed)
Called to confirm/remind patient of their appointment at the [facility] on 07/22/22.   Patient reminded to bring all medications and/or complete list.  Confirmed patient has transportation. Gave directions, instructed to utilize [place] parking.  Confirmed appointment prior to ending call.

## 2022-07-22 ENCOUNTER — Encounter (HOSPITAL_COMMUNITY): Payer: Self-pay

## 2022-07-22 ENCOUNTER — Ambulatory Visit (HOSPITAL_COMMUNITY)
Admission: RE | Admit: 2022-07-22 | Discharge: 2022-07-22 | Disposition: A | Discharge status: Home / Self Care | Payer: BC Managed Care – PPO | Source: Ambulatory Visit | Attending: Cardiology | Admitting: Cardiology

## 2022-07-22 ENCOUNTER — Ambulatory Visit (HOSPITAL_BASED_OUTPATIENT_CLINIC_OR_DEPARTMENT_OTHER)
Admission: RE | Admit: 2022-07-22 | Discharge: 2022-07-22 | Disposition: A | Discharge status: Home / Self Care | Payer: BC Managed Care – PPO | Source: Ambulatory Visit | Attending: Family Medicine | Admitting: Family Medicine

## 2022-07-22 VITALS — BP 100/60 | HR 84 | Wt 386.4 lb

## 2022-07-22 DIAGNOSIS — I5022 Chronic systolic (congestive) heart failure: Secondary | ICD-10-CM | POA: Insufficient documentation

## 2022-07-22 DIAGNOSIS — I10 Essential (primary) hypertension: Secondary | ICD-10-CM

## 2022-07-22 DIAGNOSIS — G4733 Obstructive sleep apnea (adult) (pediatric): Secondary | ICD-10-CM

## 2022-07-22 DIAGNOSIS — F1990 Other psychoactive substance use, unspecified, uncomplicated: Secondary | ICD-10-CM

## 2022-07-22 DIAGNOSIS — F1110 Opioid abuse, uncomplicated: Secondary | ICD-10-CM | POA: Insufficient documentation

## 2022-07-22 DIAGNOSIS — I493 Ventricular premature depolarization: Secondary | ICD-10-CM

## 2022-07-22 DIAGNOSIS — I110 Hypertensive heart disease with heart failure: Secondary | ICD-10-CM | POA: Diagnosis present

## 2022-07-22 DIAGNOSIS — Z720 Tobacco use: Secondary | ICD-10-CM

## 2022-07-22 LAB — BASIC METABOLIC PANEL
Anion gap: 12 (ref 5–15)
BUN: 14 mg/dL (ref 6–20)
CO2: 22 mmol/L (ref 22–32)
Calcium: 8.6 mg/dL — ABNORMAL LOW (ref 8.9–10.3)
Chloride: 105 mmol/L (ref 98–111)
Creatinine, Ser: 0.94 mg/dL (ref 0.61–1.24)
GFR, Estimated: >60 mL/min (ref >60)
Glucose, Bld: 95 mg/dL (ref 70–99)
Potassium: 3.6 mmol/L (ref 3.5–5.1)
Sodium: 139 mmol/L (ref 135–145)

## 2022-07-22 LAB — ECHOCARDIOGRAM COMPLETE
Area-P 1/2: 3.53 cm2
Calc EF: 35.5 %
S' Lateral: 5.2 cm
Single Plane A2C EF: 34.2 %
Single Plane A4C EF: 32.3 %

## 2022-07-22 LAB — BRAIN NATRIURETIC PEPTIDE: B Natriuretic Peptide: 71.5 pg/mL (ref 0.0–100.0)

## 2022-07-22 LAB — DIGOXIN LEVEL: Digoxin Level: <0.2 ng/mL — ABNORMAL LOW (ref 0.8–2.0)

## 2022-07-22 MED ORDER — PERFLUTREN LIPID MICROSPHERE
1.0000 mL | INTRAVENOUS | Status: DC | PRN
  Administered 2022-07-22: 4 mL via INTRAVENOUS
  Filled 2022-07-22: qty 10

## 2022-07-22 NOTE — Progress Notes (Signed)
Echocardiogram 2D Echocardiogram has been performed.  Donald Murray 07/22/2022, 9:46 AM

## 2022-07-22 NOTE — Progress Notes (Signed)
H&V Care Navigation CSW Progress Note  Clinical Social Worker met with pt to discuss financial assistance with CPAP.  Pt had been provided [employer] financial assistance app in the past but has not applied and doesn't know where the app is.  CSW provided pt with the app and informed him to complete and then call [employer] to check on status of order and inquire if anything new needs to be sent in since the length of time since order was sent is so long.  Pt expressed understanding and reports no other concerns at this time.   SDOH Screenings   Food Insecurity: No Food Insecurity (01/17/2021)  Housing: Low Risk  (01/17/2021)  Transportation Needs: No Transportation Needs (01/17/2021)  Financial Resource Strain: High Risk (04/08/2022)  Tobacco Use: High Risk (07/22/2022)    Donald Murray, [facility] Desk#: [phone] Cell#: [phone]

## 2022-07-22 NOTE — Addendum Note (Signed)
Encounter addended by: Kerry Dory, CMA on: 07/22/2022 9:34 AM  Actions taken: Clinical Note Signed, Charge Capture section accepted

## 2022-07-22 NOTE — Patient Instructions (Signed)
It was great to see you today! No medication changes are needed at this time.  Labs today We will only contact you if something comes back abnormal or we need to make some changes. Otherwise no news is good news!   Your physician recommends that you schedule a follow-up appointment in: 3 months with Dr Haroldine Laws  Do the following things EVERYDAY: Weigh yourself in the morning before breakfast. Write it down and keep it in a log. Take your medicines as prescribed Eat low salt foods--Limit salt (sodium) to 2000 mg per day.  Stay as active as you can everyday Limit all fluids for the day to less than 2 liters  At the [facility], you and your health needs are our priority. As part of our continuing mission to provide you with exceptional heart care, we have created designated Provider Care Teams. These Care Teams include your primary Cardiologist (physician) and Advanced Practice Providers (APPs- Physician Assistants and Nurse Practitioners) who all work together to provide you with the care you need, when you need it.   You may see any of the following providers on your designated Care Team at your next follow up: Dr Glori Bickers Dr Loralie Champagne Dr. Roxana Hires, NP Lyda Jester, [place] [facility] [place], [place] Forestine Na, NP Audry Riles, PharmD   Please be sure to bring in all your medications bottles to every appointment.    Thank you for choosing [facility]   If you have any questions or concerns before your next appointment please send [place] a message through [place] or call our office at [phone].    TO LEAVE A MESSAGE FOR THE NURSE SELECT OPTION 2, PLEASE LEAVE A MESSAGE INCLUDING: YOUR NAME DATE OF BIRTH CALL BACK NUMBER REASON FOR CALL**this is important as we prioritize the call backs  YOU WILL RECEIVE A CALL BACK THE SAME DAY AS LONG AS YOU CALL BEFORE 4:00 PM

## 2022-07-22 NOTE — Progress Notes (Signed)
ADVANCED HF CLINIC NOTE  Primary Care: Patient, [name] Primary Cardiologist: Kirk Ruths, MD HF Cardiologist: Dr. Haroldine Laws  HPI: Donald Murray is a morbidly obese 37 y.o.  male with HTN, previous IVDA (heroin), tobacco use and systolic HF (onset 0000000) referred by Dr. [name] for further management of his HF.   Echo 10/20 EF 60-65%   Admitted to [facility] 9/21 with new onset HF in setting of severe HTN 174/128.Marland Kitchen ECG with sinus tach and frequent PVCs.   Echo 02/16/20: EF 20-25% Moderate RV dysfunction.   Dr. Haroldine Laws saw him in HF Consultation for the first time 04/24/20. 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 Lasix and Entresto increased. Digoxin added. Zio placed to quantify PVC burden - 4.3%.  Admitted 12/22 with A/C CHF exacerbation. Echo LVEF <20% w/ global HK, no visible thrombus, RV okay, mild MR.  Underwent cath 06/12/21 with normal cors. EF < 10% Elevated filling pressures (PCWP 28) and low output (CI 1.5). Lasix increased to 80 bid.  Sleep study 02/23: Severe OSA, moderate central sleep apnea. Waiting for CPAP machine, reports he was told there is a backorder on supplies.  Echo 09/24/21: EF 20-25% RV moderately HK  Seen by Dr. Caryl Comes in 7/23 and planning ICD. But hasn't been done due to scheduling issues.   Today he returns for HF follow up. Seen in ED couple weeks ago with SOB, found to be Flu A +. Given a dose of IV lasix 40 mg and Rx TamiFlu. Overall feeling fine. He has SOB walking up steps but otherwise no issues with ADLs or work duties. Denies palpitations, abnormal bleeding, CP, dizziness, edema, or PND/Orthopnea. Appetite ok. No fever or chills. He is not weighing at home, gained 30 lbs over last 9 months. Taking all medications. Works at [employer] and [employer]. Smokes 1/2 ppd. No ETOH or drugs.  Echo today 07/22/22, results pending.   Cardiac Studies:   - CPX test 07/18/21 FVC 5.12  (92%)      FEV1 4.34  (96%)        FEV1/FVC 85   (103%)        MVV 164   (93%)   Resting HR: 126 Standing HR:125 Peak HR: 175   (95% age predicted max HR)  BP rest (standing): 92/70 BP peak: 148/72  Peak VO2: 21.6 (87% predicted peak VO2)  When adjusted to the patient's ideal body weight of 180.3 lb (81.8 kg) the peak VO2 is 41.6 ml/kg (ibw)/min (99% of the ibw-adjusted predicted).  VE/VCO2 slope:  45  VE/MVV:  92%  O2pulse:  19 (90% predicted O2pulse)    - RHC 1/23 Ao = 91/72 (81) LV = 108/37 RA = 9 RV = 51/15 PA = 66/38 (50) PCW = 28 Fick cardiac output/index = 3.9/1.5 PVR = 5.7 SVR = 1,484 Ao sat = 98% PA sat = 55%, 54% PAPI = 3.1  - Zio 12/21 Sinus - average HR 104 4.3% PVCs  Past Medical History:  Diagnosis Date   CHF (congestive heart failure) (HCC)    Hypertension    Obesity    Snoring    Current Outpatient Medications  Medication Sig Dispense Refill   buPROPion (WELLBUTRIN) 75 MG tablet Take 1 tablet (75 mg total) by mouth 2 (two) times daily. 60 tablet 0   dapagliflozin propanediol (FARXIGA) 10 MG TABS tablet Take 1 tablet (10 mg total) by mouth daily before breakfast. 90 tablet 3   digoxin (LANOXIN) 0.125 MG tablet Take 1 tablet (0.125 mg  total) by mouth daily. 90 tablet 3   ivabradine (CORLANOR) 7.5 MG TABS tablet Take 1 tablet by mouth 2 times daily with a meal. 60 tablet 6   metolazone (ZAROXOLYN) 2.5 MG tablet Take 1 tablet (2.5 mg total) by mouth once a week on Wednesdays with 2 extra tablets of potassium 30 tablet 1   metoprolol succinate (TOPROL XL) 25 MG 24 hr tablet Take 1 tablet (25 mg total) by mouth at bedtime. 30 tablet 6   potassium chloride SA (KLOR-CON M) 20 MEQ tablet Take 4 tablets (80 mEq total) by mouth daily. 260 tablet 3   sacubitril-valsartan (ENTRESTO) 49-51 MG Take 1 tablet by mouth 2 (two) times daily. 180 tablet 11   spironolactone (ALDACTONE) 25 MG tablet Take 25 mg by mouth daily.     torsemide (DEMADEX) 20 MG tablet Take 2 tablets (40 mg total) by mouth 2 (two) times daily. 360 tablet 3   No current  facility-administered medications for this encounter.   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"   Drug use: Not Currently    Comment: Pt stated "It was opiates"   Sexual activity: Not on file  Other Topics Concern   Not on file  Social History Narrative   Not on file   Social Determinants of Health   Financial Resource Strain: High Risk (04/08/2022)   Overall Financial Resource Strain (CARDIA)    Difficulty of Paying Living Expenses: Very hard  Food Insecurity: No Food Insecurity (01/17/2021)   Hunger Vital Sign    Worried About Running Out of Food in the Last Year: Never true    Ran Out of Food in the Last Year: Never true  Transportation Needs: No Transportation Needs (01/17/2021)   PRAPARE - Hydrologist (Medical): No    Lack of Transportation (Non-Medical): No  Physical Activity: Not on file  Stress: Not on file  Social Connections: Not on file  Intimate Partner Violence: Not on file   Family History  Problem Relation Age of Onset   Hypertension Mother    Hypertension Father     BP 100/60   Pulse 84   Wt (!) 175.3 kg (386 lb 6.4 oz)   SpO2 93%   BMI 53.89 kg/m   Wt Readings from Last 3 Encounters:  07/22/22 (!) 175.3 kg (386 lb 6.4 oz)  07/05/22 (!) 163.3 kg (360 lb)  03/31/22 (!) 178.7 kg (394 lb)    PHYSICAL EXAM: General:  NAD. No resp difficulty, walked into clinic HEENT: Normal Neck: Supple. No  JVD, thick neck. Carotids 2+ bilat; no bruits. No lymphadenopathy or thryomegaly appreciated. Cor: PMI nondisplaced. Regular rate & rhythm. No rubs, gallops or murmurs. Lungs: Clear Abdomen: Soft, obese, nontender, nondistended. No hepatosplenomegaly. No bruits or masses. Good bowel sounds. Extremities: No  cyanosis, clubbing, rash, edema Neuro: Alert & oriented x 3, cranial nerves grossly intact. Moves all 4 extremities w/o difficulty. Affect pleasant.   ASSESSMENT & PLAN:  1. Chronic Systolic HF - diagnosed 0000000 Echo EF 20-25% RV moderately HK - suspect HTN vs PVC-mediated (PVC burden 4.3% - probably not high enough to cause CM) - cath 1/23 no CAD EF < 10% - CPX 2/23 pVO2: 21.6 (87% predicted peak VO2) corrected for ibw pVO2 41.6 ml/kg (ibw)/min (99% of the ibw-adjusted predicted). Slope:45 O2pulse:  19 (90% pred) - Echo 09/24/21: EF 20-25% RV moderately HK - Echo today 07/22/22, results pending. - Stable NYHA II-III. CPX test reassuring VO2 but slope high - Weight continues to climb. Now up 30 pounds in 9 months. But volume looks good on exam.  - Continue torsemide 40 mg bid + metolazone once a week w/ extra 40 KCL (Wednesdays) - Continue KCL 80 daily. - Continue Farxiga 10 mg daily. - Continue Entresto 49/51 mg bid (cut back due to fatigue). - Continue digoxin 0.125 mg daily. - Continue spiro 25 mg daily. - Continue Toprol 25 mg qhs (off carvedilol due to low output) - Continue Ivabradine 7.5 mg bid. - Continue to follow for need for advanced therapies but weight may be getting prohibitive even for VAD - Body mass index is 53.89 kg/m. - Seen by Dr. Caryl Comes. Planning for ICD implant. - Labs today  2. HTN, severe - Blood pressure well controlled.  - Continue current regimen.  3. Frequent PVCs - Zio 12/21 4.3% PVCs - probably not high enough burden to cause CM  - Has severe OSA. Awaiting CPAP equipment but cannot afford. Will have SW see today.  4. IVDA (heroin) - Reports no use  since 12/20.  5. Morbid obesity - Body mass index is 53.89 kg/m. - Weight continues to climb - Unable to get Wegovy due to insurance  - He has been referred to [facility], but cost barrier.  6. Sleep apnea - Severe obstructive, moderate central - Not on CPAP yet. Cannot afford. - HFSW helping.  7. Tobacco use - Smoking 1/2 ppd.  - Discussed need for cessation  Follow up in 3 months with Dr. Haroldine Laws.  Allena Katz, FNP-BC 07/22/22 9:12 AM

## 2022-07-22 NOTE — Addendum Note (Signed)
Encounter addended by: Jorge Ny, LCSW on: 07/22/2022 10:53 AM  Actions taken: Clinical Note Signed

## 2022-09-09 IMAGING — DX DG CHEST 1V PORT
1 series · 1 of 1 positions shown · non-contrast
Comparison: December 29, 2020.

CLINICAL DATA: Cough, shortness of breath.

EXAM:
PORTABLE CHEST 1 VIEW

[chest]
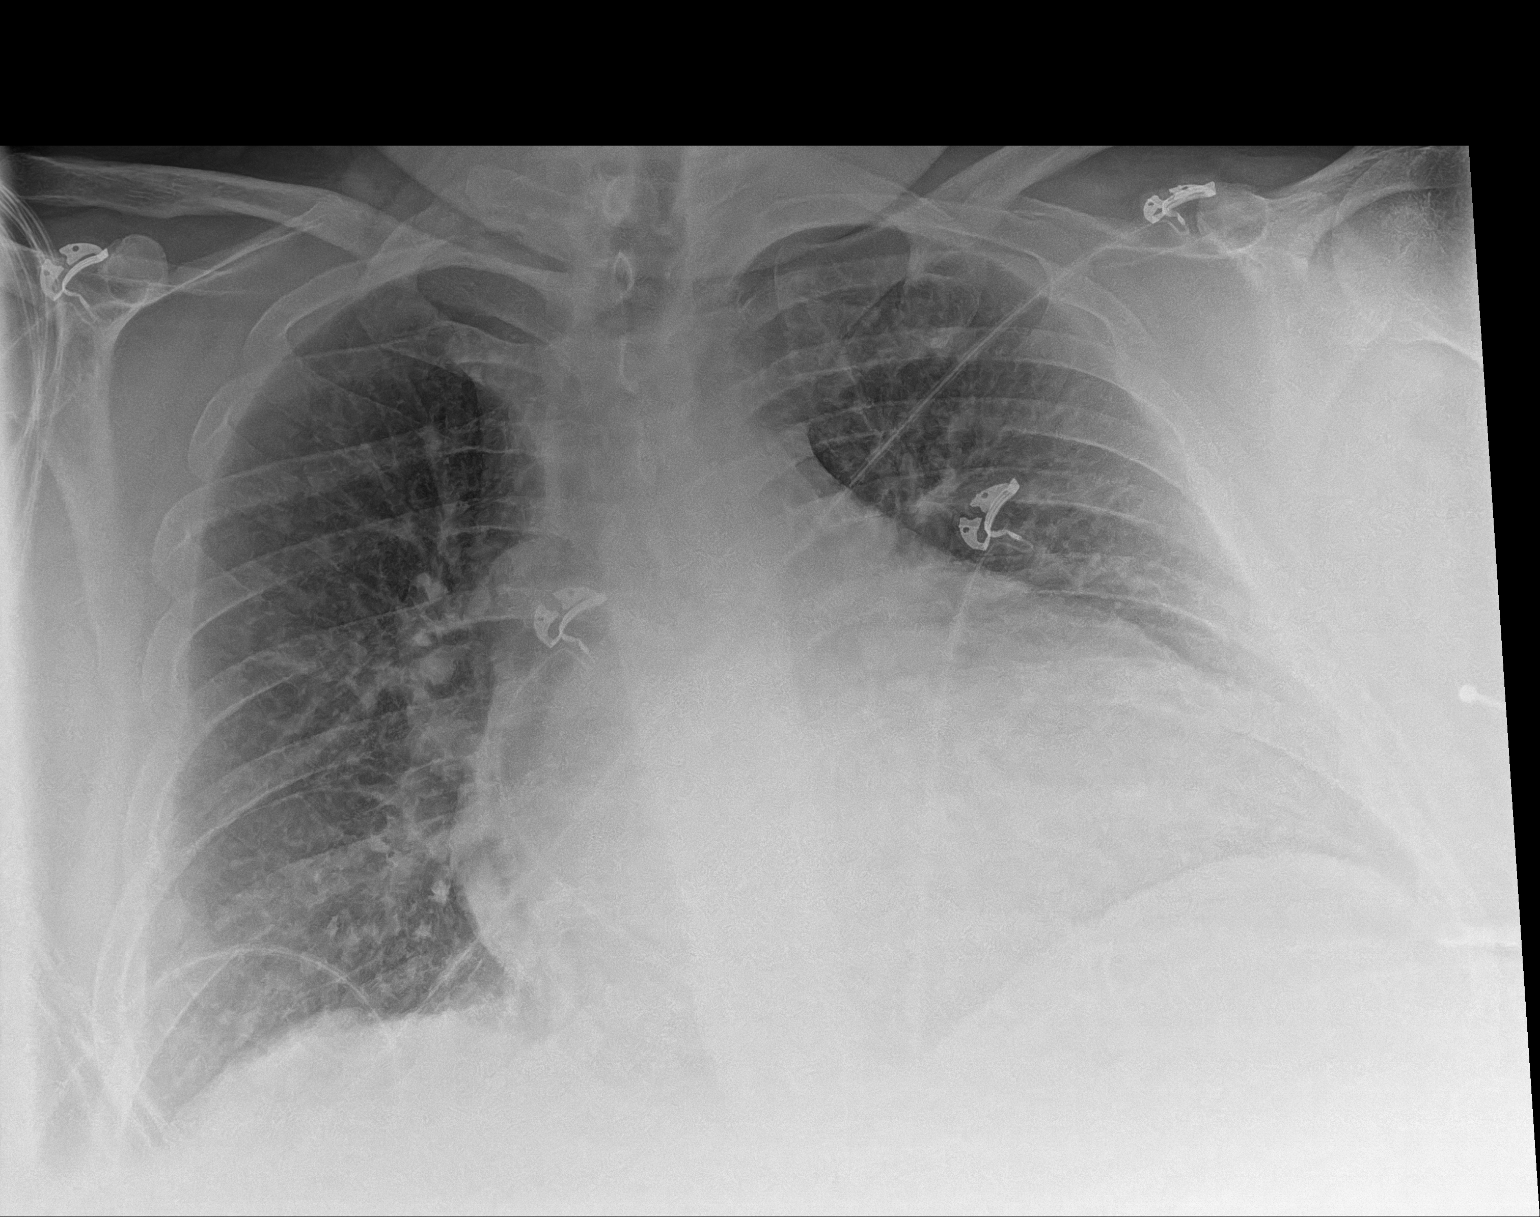

[1 of 1 positions shown; findings below may reference images not displayed]

FINDINGS: Mild cardiomegaly is noted. Both lungs are clear. The visualized
skeletal structures are unremarkable.
IMPRESSION: No active disease.

## 2022-09-26 IMAGING — DX DG CHEST 1V PORT
1 series · 2 of 2 positions shown · non-contrast
Comparison: Chest x-ray 05/04/2021.

CLINICAL DATA: 34-year-old male with history of shortness of
breath.

EXAM:
PORTABLE CHEST 1 VIEW

[Series 1: chest · 0.14mm/px · 2 of 2 slices shown]
[im 1/2]
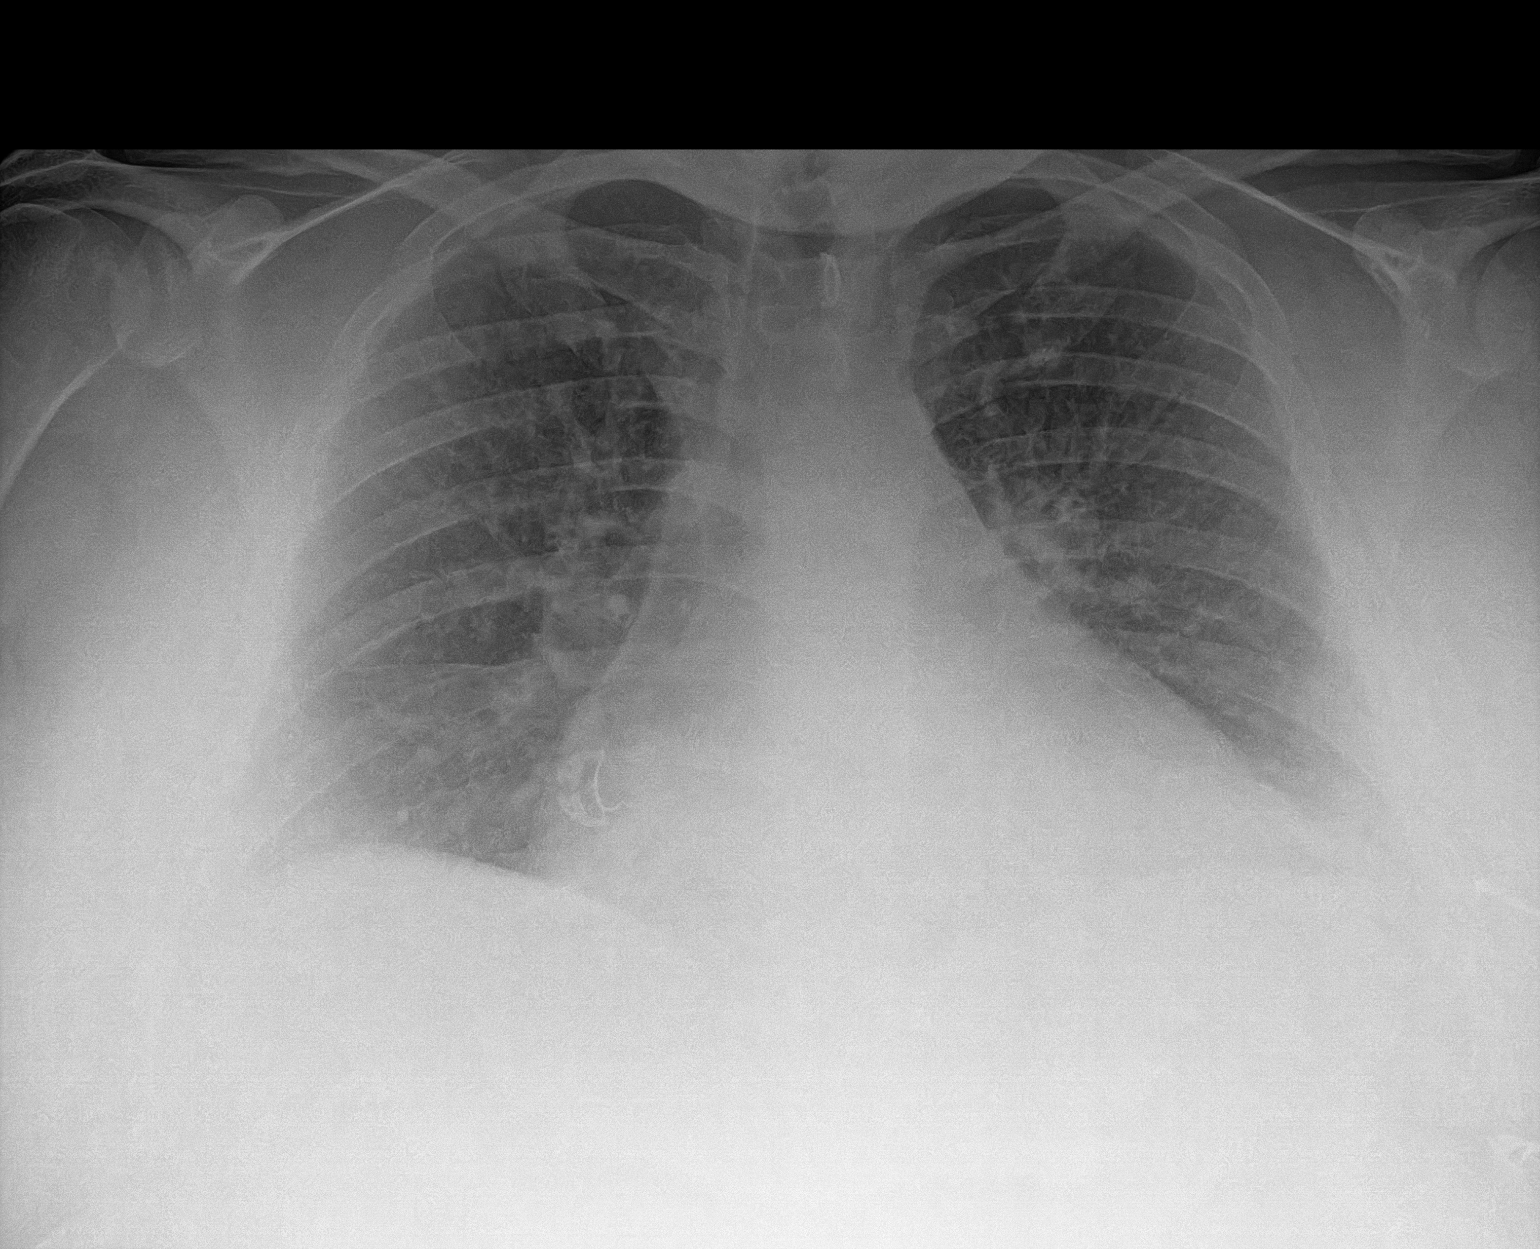
[im 2/2]
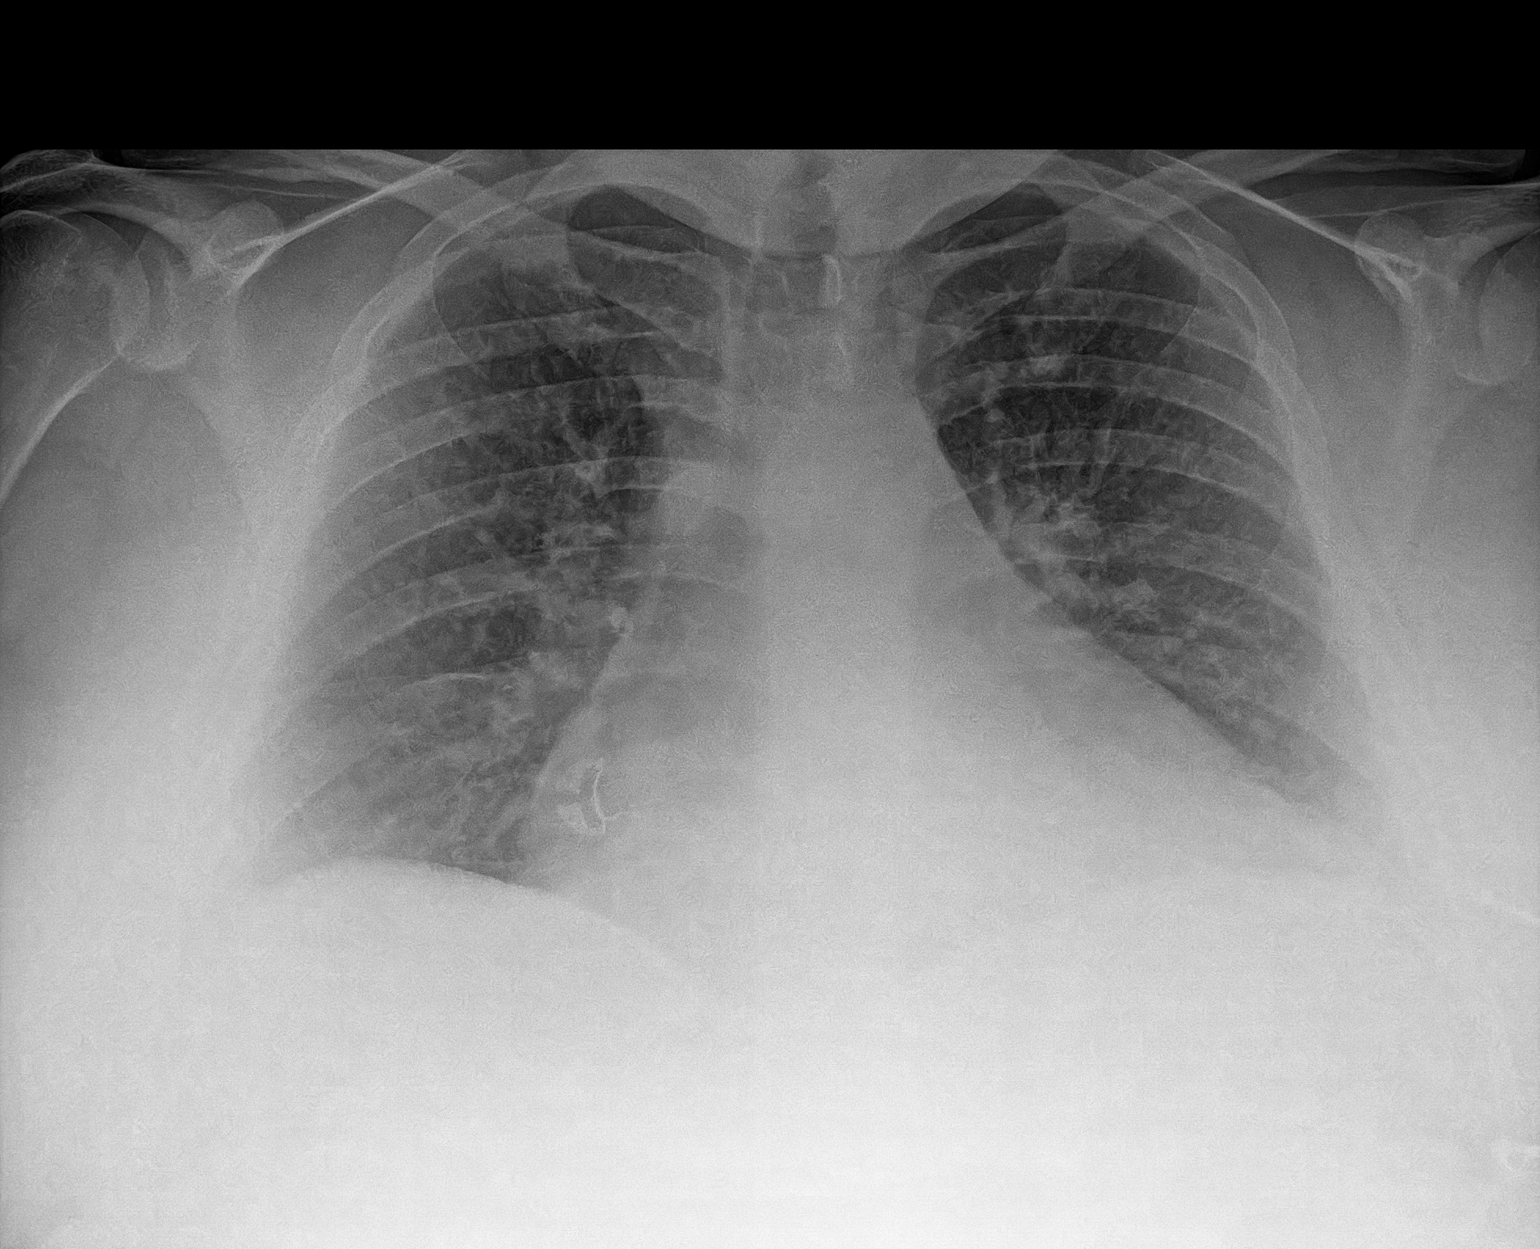

[2 of 2 positions shown; findings below may reference images not displayed]

FINDINGS: Images under penetrated, limiting the diagnostic sensitivity and
specificity of the examination. With these limitations in mind lung
volumes are low. No consolidative airspace disease. No pleural
effusions. No pneumothorax. No pulmonary nodule or mass noted.
Pulmonary vasculature is engorged, without frank pulmonary edema.
Mild cardiomegaly.
IMPRESSION: 1. Low lung volumes with cardiomegaly and pulmonary venous
congestion, but no frank pulmonary edema.

## 2022-10-19 NOTE — Progress Notes (Addendum)
ADVANCED HF CLINIC NOTE  Primary Care: Patient, [name] Primary Cardiologist: Olga Millers, MD HF Cardiologist: Dr. Gala Romney  HPI: Donald Murray is a morbidly obese 37 y.o. male with HTN, previous IVDA (heroin), tobacco use and systolic HF (onset 9/21) referred by Dr. Jens Som for further management of his HF.   Echo 10/20 EF 60-65%   Admitted to [facility] 9/21 with new onset HF in setting of severe HTN 174/128. ECG with sinus tach and frequent PVCs.  Echo EF 20-25% Moderate RV dysfunction.   Dr. Gala Romney saw him in HF Consultation for the first time 04/24/20. 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 Zio placed to quantify PVC burden - 4.3%.  Admitted 12/22 with ADHF. Echo LVEF <20% w/ global HK, no visible thrombus, RV okay, mild MR.  Underwent cath 06/12/21 with normal cors. EF < 10% Elevated filling pressures (PCWP 28) and low output (CI 1.5). Lasix increased to 80 bid.  Sleep study 02/23: Severe OSA, moderate central sleep apnea. Waiting for CPAP machine, reports he was told there is a backorder on supplies.  Echo 09/24/21: EF 20-25% RV moderately HK  Seen by Dr. Graciela Husbands in 7/23 and planning ICD. But hasn't been done due to scheduling issues.   Echo 2/24: EF 25-30%. RV moderately HK  Today he returns for HF follow up. Works at [employer] and [employer]. Went thru a depression and stopped meds last month, restarted meds last week. He has SOB walking further distances on flat ground. Feels a little worse. Energy is poor. Can't afford CPAP. Denies palpitations, CP, dizziness, edema, or PND/Orthopnea. Appetite ok. No fever or chills. He is not weighing at home. Smokes < 1/2 ppd. No ETOH or drugs.  Cardiac Studies: - Echo 2/24: EF 25-30%. RV moderately HK  - CPX test 07/18/21 FVC 5.12  (92%)      FEV1 4.34  (96%)        FEV1/FVC 85   (103%)        MVV 164  (93%)   Resting HR: 126 Standing HR:125 Peak HR: 175   (95% age predicted max HR)  BP rest (standing): 92/70 BP peak: 148/72   Peak VO2: 21.6 (87% predicted peak VO2)  When adjusted to the patient's ideal body weight of 180.3 lb (81.8 kg) the peak VO2 is 41.6 ml/kg (ibw)/min (99% of the ibw-adjusted predicted).  VE/VCO2 slope:  45  VE/MVV:  92%  O2pulse:  19 (90% predicted O2pulse)    - RHC 1/23 Ao = 91/72 (81) LV = 108/37 RA = 9 RV = 51/15 PA = 66/38 (50) PCW = 28 Fick cardiac output/index = 3.9/1.5 PVR = 5.7 SVR = 1,484 Ao sat = 98% PA sat = 55%, 54% PAPI = 3.1  - Zio 12/21 Sinus - average HR 104 4.3% PVCs  Past Medical History:  Diagnosis Date   CHF (congestive heart failure) (HCC)    Hypertension    Obesity    Snoring    Current Outpatient Medications  Medication Sig Dispense Refill   dapagliflozin propanediol (FARXIGA) 10 MG TABS tablet Take 1 tablet (10 mg total) by mouth daily before breakfast. 90 tablet 3   digoxin (LANOXIN) 0.125 MG tablet Take 1 tablet (0.125 mg total) by mouth daily. 90 tablet 3   ivabradine (CORLANOR) 7.5 MG TABS tablet Take 1 tablet by mouth 2 times daily with a meal. 60 tablet 6   metolazone (ZAROXOLYN) 2.5 MG tablet Take 1 tablet (2.5 mg total) by mouth once a week  on Wednesdays with 2 extra tablets of potassium 30 tablet 1   metoprolol succinate (TOPROL XL) 25 MG 24 hr tablet Take 1 tablet (25 mg total) by mouth at bedtime. 30 tablet 6   potassium chloride SA (KLOR-CON M) 20 MEQ tablet Take 4 tablets (80 mEq total) by mouth daily. 260 tablet 3   sacubitril-valsartan (ENTRESTO) 49-51 MG Take 1 tablet by mouth 2 (two) times daily. 180 tablet 11   spironolactone (ALDACTONE) 25 MG tablet Take 25 mg by mouth daily.     torsemide (DEMADEX) 20 MG tablet Take 2 tablets (40 mg total) by mouth 2 (two) times daily. 360 tablet 3   No current facility-administered medications for this encounter.   No Known Allergies  Social History   Socioeconomic History   Marital status: Single    Spouse name: Not on file   Number of children: Not on file   Years of education: Not  on file   Highest education level: Not on file  Occupational History   Not on file  Tobacco Use   Smoking status: Every Day    Packs/day: 1.00    Years: 16.00    Additional pack years: 0.00    Total pack years: 16.00    Types: Cigarettes   Smokeless tobacco: Never  Vaping Use   Vaping Use: Some days   Substances: Nicotine  Substance and Sexual Activity   Alcohol use: Not Currently    Comment: Pt stated "2 years clean"   Drug use: Not Currently    Comment: Pt stated "It was opiates"   Sexual activity: Not on file  Other Topics Concern   Not on file  Social History Narrative   Not on file   Social Determinants of Health   Financial Resource Strain: High Risk (04/08/2022)   Overall Financial Resource Strain (CARDIA)    Difficulty of Paying Living Expenses: Very hard  Food Insecurity: No Food Insecurity (01/17/2021)   Hunger Vital Sign    Worried About Running Out of Food in the Last Year: Never true    Ran Out of Food in the Last Year: Never true  Transportation Needs: No Transportation Needs (01/17/2021)   PRAPARE - Administrator, Civil Service (Medical): No    Lack of Transportation (Non-Medical): No  Physical Activity: Not on file  Stress: Not on file  Social Connections: Not on file  Intimate Partner Violence: Not on file   Family History  Problem Relation Age of Onset   Hypertension Mother    Hypertension Father     BP 110/80   Pulse (!) 109   Wt (!) 182.7 kg (402 lb 12.8 oz)   SpO2 96%   BMI 56.18 kg/m   Wt Readings from Last 3 Encounters:  10/20/22 (!) 182.7 kg (402 lb 12.8 oz)  07/22/22 (!) 175.3 kg (386 lb 6.4 oz)  07/05/22 (!) 163.3 kg (360 lb)    PHYSICAL EXAM: General:  NAD. No resp difficulty, walked into clinic HEENT: Normal Neck: Supple. Thick neck. Hard to see JVP. Carotids 2+ bilat; no bruits. No lymphadenopathy or thryomegaly appreciated. Cor: PMI nondisplaced. Tachy regular rate & rhythm. No rubs, gallops or murmurs. Lungs:  Clear Abdomen: Soft, obese, nontender, nondistended. No hepatosplenomegaly. No bruits or masses. Good bowel sounds. Extremities: No cyanosis, clubbing, rash, edema Neuro: Alert & oriented x 3, cranial nerves grossly intact. Moves all 4 extremities w/o difficulty. Affect pleasant.  ECG (personally reviewed): ST 115 bpm  ASSESSMENT & PLAN:  1. Chronic Systolic HF - diagnosed 9/21 Echo EF 20-25% RV moderately HK - suspect HTN vs PVC-mediated (PVC burden 4.3% - probably not high enough to cause CM) - cath 1/23 no CAD EF < 10% - CPX 2/23 pVO2: 21.6 (87% predicted peak VO2) corrected for ibw pVO2 41.6 ml/kg (ibw)/min (99% of the ibw-adjusted predicted). Slope:45 O2pulse:  19 (90% pred) - Echo 09/24/21: EF 20-25% RV moderately HK - Echo 2/24: EF 25-30%. RV moderately HK - NYHA II-III. CPX test reassuring VO2 but slope high and he remains tachycardic so I am concerned - Weight continues to climb. But volume looks good on exam. Needs to stay on meds. - Continue torsemide 40 mg bid w/ 80 KCL daily + metolazone once a week w/ extra 40 KCL (Wednesdays) - Continue Farxiga 10 mg daily. - Continue Entresto 49/51 mg bid (cut back due to fatigue). - Continue digoxin 0.125 mg daily. - Continue spiro 25 mg daily. - Continue Toprol 25 mg qhs (off carvedilol due to low output) - Continue Ivabradine 7.5 mg bid. - Continue to follow for need for advanced therapies. We discussed the potential for VAD again but weight is getting quite high. Will recheck Hgba1c, if climbing will try again for GLP1RA - Body mass index is 56.18 kg/m. - Seen by Dr. Graciela Husbands. Planning for ICD implant down the road -> need to be compliant with meds first.  - Labs today  2. HTN, severe - Blood pressure well controlled.  - Continue current regimen.  3. Frequent PVCs - Zio 12/21 4.3% PVCs - probably not high enough burden to cause CM  - Has severe OSA. Awaiting CPAP equipment but cannot afford. Will have SW see today.  4. IVDA  (heroin) - Reports no use since 12/20.  5. Morbid obesity - Body mass index is 56.18 kg/m. - Weight continues to climb - Unable to get Wegovy due to insurance, check A1C today -  if climbing will try again for GLP1RA - He has been referred to [facility], but cost barrier.  6. Sleep apnea - Severe obstructive, moderate central - Not on CPAP yet. Cannot afford. - HFSW helping.  7. Tobacco use - Smoking 1/2 ppd.  - Discussed need for cessation  8. Depression - Seems to be doing better - Discussed counseling +/- meds - Will get him PC info to establish care.  Follow up in 2 months.  Arvilla Meres MD 9:15 AM

## 2022-10-20 ENCOUNTER — Encounter (HOSPITAL_COMMUNITY): Payer: Self-pay | Admitting: Internal Medicine

## 2022-10-20 ENCOUNTER — Ambulatory Visit (HOSPITAL_COMMUNITY)
Admission: RE | Admit: 2022-10-20 | Discharge: 2022-10-20 | Disposition: A | Discharge status: Home / Self Care | Payer: BC Managed Care – PPO | Source: Ambulatory Visit | Attending: Internal Medicine | Admitting: Internal Medicine

## 2022-10-20 VITALS — BP 110/80 | HR 109 | Wt >= 6400 oz

## 2022-10-20 DIAGNOSIS — F1110 Opioid abuse, uncomplicated: Secondary | ICD-10-CM | POA: Diagnosis not present

## 2022-10-20 DIAGNOSIS — F3289 Other specified depressive episodes: Secondary | ICD-10-CM

## 2022-10-20 DIAGNOSIS — I5022 Chronic systolic (congestive) heart failure: Secondary | ICD-10-CM | POA: Diagnosis not present

## 2022-10-20 DIAGNOSIS — I493 Ventricular premature depolarization: Secondary | ICD-10-CM

## 2022-10-20 DIAGNOSIS — Z79899 Other long term (current) drug therapy: Secondary | ICD-10-CM | POA: Diagnosis not present

## 2022-10-20 DIAGNOSIS — I110 Hypertensive heart disease with heart failure: Secondary | ICD-10-CM | POA: Diagnosis present

## 2022-10-20 DIAGNOSIS — Z6841 Body Mass Index (BMI) 40.0 and over, adult: Secondary | ICD-10-CM | POA: Diagnosis not present

## 2022-10-20 DIAGNOSIS — I444 Left anterior fascicular block: Secondary | ICD-10-CM | POA: Insufficient documentation

## 2022-10-20 DIAGNOSIS — G4733 Obstructive sleep apnea (adult) (pediatric): Secondary | ICD-10-CM | POA: Diagnosis not present

## 2022-10-20 DIAGNOSIS — G4730 Sleep apnea, unspecified: Secondary | ICD-10-CM | POA: Diagnosis not present

## 2022-10-20 DIAGNOSIS — Z720 Tobacco use: Secondary | ICD-10-CM

## 2022-10-20 DIAGNOSIS — F32A Depression, unspecified: Secondary | ICD-10-CM | POA: Diagnosis not present

## 2022-10-20 DIAGNOSIS — I10 Essential (primary) hypertension: Secondary | ICD-10-CM

## 2022-10-20 DIAGNOSIS — F17210 Nicotine dependence, cigarettes, uncomplicated: Secondary | ICD-10-CM | POA: Diagnosis not present

## 2022-10-20 DIAGNOSIS — R000 Tachycardia, unspecified: Secondary | ICD-10-CM | POA: Insufficient documentation

## 2022-10-20 DIAGNOSIS — F1990 Other psychoactive substance use, unspecified, uncomplicated: Secondary | ICD-10-CM | POA: Diagnosis not present

## 2022-10-20 LAB — COMPREHENSIVE METABOLIC PANEL
ALT: 20 U/L (ref 0–44)
AST: 21 U/L (ref 15–41)
Albumin: 3.2 g/dL — ABNORMAL LOW (ref 3.5–5.0)
Alkaline Phosphatase: 58 U/L (ref 38–126)
Anion gap: 8 (ref 5–15)
BUN: 11 mg/dL (ref 6–20)
CO2: 26 mmol/L (ref 22–32)
Calcium: 8.8 mg/dL — ABNORMAL LOW (ref 8.9–10.3)
Chloride: 103 mmol/L (ref 98–111)
Creatinine, Ser: 1.12 mg/dL (ref 0.61–1.24)
GFR, Estimated: >60 mL/min (ref >60)
Glucose, Bld: 102 mg/dL — ABNORMAL HIGH (ref 70–99)
Potassium: 3.6 mmol/L (ref 3.5–5.1)
Sodium: 137 mmol/L (ref 135–145)
Total Bilirubin: 0.5 mg/dL (ref 0.3–1.2)
Total Protein: 6.5 g/dL (ref 6.5–8.1)

## 2022-10-20 LAB — BRAIN NATRIURETIC PEPTIDE: B Natriuretic Peptide: 297.9 pg/mL — ABNORMAL HIGH (ref 0.0–100.0)

## 2022-10-20 LAB — DIGOXIN LEVEL: Digoxin Level: 0.3 ng/mL — ABNORMAL LOW (ref 0.8–2.0)

## 2022-10-20 NOTE — Patient Instructions (Signed)
No changes to your medications.  Labs done today, your results will be available in MyChart, we will contact you for abnormal readings.  Your physician recommends that you schedule a follow-up appointment in: 2-3 months  If you have any questions or concerns before your next appointment please send [place] a message through [place] or call our office at [phone].    TO LEAVE A MESSAGE FOR THE NURSE SELECT OPTION 2, PLEASE LEAVE A MESSAGE INCLUDING: YOUR NAME DATE OF BIRTH CALL BACK NUMBER REASON FOR CALL**this is important as we prioritize the call backs  YOU WILL RECEIVE A CALL BACK THE SAME DAY AS LONG AS YOU CALL BEFORE 4:00 PM  At the [facility], you and your health needs are our priority. As part of our continuing mission to provide you with exceptional heart care, we have created designated Provider Care Teams. These Care Teams include your primary Cardiologist (physician) and Advanced Practice Providers (APPs- Physician Assistants and Nurse Practitioners) who all work together to provide you with the care you need, when you need it.   You may see any of the following providers on your designated Care Team at your next follow up: Dr Arvilla Meres Dr Marca Ancona Dr. Marcos Eke, NP Robbie Lis, [place] [facility] [place], [place] Brynda Peon, NP Karle Plumber, PharmD   Please be sure to bring in all your medications bottles to every appointment.    Thank you for choosing [facility]

## 2022-10-20 NOTE — Addendum Note (Signed)
Encounter addended by: Dolores Patty, MD on: 10/20/2022 10:12 AM  Actions taken: Clinical Note Signed, Level of Service modified

## 2022-10-21 ENCOUNTER — Encounter (HOSPITAL_COMMUNITY): Payer: Self-pay | Admitting: Internal Medicine

## 2022-10-21 LAB — HEMOGLOBIN A1C
Hgb A1c MFr Bld: 5.9 % — ABNORMAL HIGH (ref 4.8–5.6)
Mean Plasma Glucose: 123 mg/dL

## 2022-11-01 ENCOUNTER — Other Ambulatory Visit (HOSPITAL_COMMUNITY): Payer: Self-pay | Admitting: Family Medicine

## 2022-11-01 ENCOUNTER — Other Ambulatory Visit (HOSPITAL_COMMUNITY): Payer: Self-pay | Admitting: Internal Medicine

## 2022-11-01 DIAGNOSIS — R000 Tachycardia, unspecified: Secondary | ICD-10-CM

## 2022-11-01 DIAGNOSIS — I50812 Chronic right heart failure: Secondary | ICD-10-CM

## 2022-11-01 DIAGNOSIS — I5023 Acute on chronic systolic (congestive) heart failure: Secondary | ICD-10-CM

## 2022-11-02 ENCOUNTER — Other Ambulatory Visit: Payer: Self-pay

## 2022-11-02 ENCOUNTER — Other Ambulatory Visit (HOSPITAL_COMMUNITY): Payer: Self-pay

## 2022-11-02 MED ORDER — IVABRADINE HCL 7.5 MG PO TABS
7.5000 mg | ORAL_TABLET | Freq: Two times a day (BID) | ORAL | 11 refills | Status: DC
  Filled 2022-11-02 – 2022-11-18 (×2): qty 60, 30d supply, fill #0
  Filled 2023-06-11: qty 60, 30d supply, fill #1
  Filled 2023-07-13: qty 60, 30d supply, fill #0

## 2022-11-02 MED ORDER — DAPAGLIFLOZIN PROPANEDIOL 10 MG PO TABS
10.0000 mg | ORAL_TABLET | Freq: Every day | ORAL | 3 refills | Status: DC
  Filled 2022-11-02: qty 30, 30d supply, fill #0
  Filled 2022-11-18: qty 90, 90d supply, fill #0

## 2022-11-02 MED ORDER — DIGOXIN 125 MCG PO TABS
0.1250 mg | ORAL_TABLET | Freq: Every day | ORAL | 3 refills | Status: DC
  Filled 2022-11-02 – 2022-11-18 (×2): qty 90, 90d supply, fill #0
  Filled 2023-04-19 – 2023-04-20 (×2): qty 90, 90d supply, fill #1
  Filled 2023-07-13 – 2023-08-18 (×4): qty 90, 90d supply, fill #0

## 2022-11-12 ENCOUNTER — Other Ambulatory Visit: Payer: Self-pay

## 2022-11-12 ENCOUNTER — Other Ambulatory Visit (HOSPITAL_COMMUNITY): Payer: Self-pay

## 2022-11-13 ENCOUNTER — Other Ambulatory Visit (HOSPITAL_COMMUNITY): Payer: Self-pay

## 2022-11-16 ENCOUNTER — Other Ambulatory Visit (HOSPITAL_COMMUNITY): Payer: Self-pay

## 2022-11-17 ENCOUNTER — Other Ambulatory Visit: Payer: Self-pay

## 2022-11-18 ENCOUNTER — Other Ambulatory Visit (HOSPITAL_COMMUNITY): Payer: Self-pay

## 2022-11-18 ENCOUNTER — Other Ambulatory Visit: Payer: Self-pay

## 2022-11-19 ENCOUNTER — Other Ambulatory Visit: Payer: Self-pay

## 2022-12-03 ENCOUNTER — Other Ambulatory Visit (HOSPITAL_COMMUNITY): Payer: Self-pay

## 2022-12-03 ENCOUNTER — Encounter (HOSPITAL_COMMUNITY): Payer: Self-pay | Admitting: Internal Medicine

## 2022-12-03 ENCOUNTER — Telehealth (HOSPITAL_COMMUNITY): Payer: Self-pay

## 2022-12-03 ENCOUNTER — Ambulatory Visit (HOSPITAL_COMMUNITY)
Admission: RE | Admit: 2022-12-03 | Discharge: 2022-12-03 | Disposition: A | Discharge status: Home / Self Care | Payer: BC Managed Care – PPO | Source: Ambulatory Visit | Attending: Internal Medicine | Admitting: Internal Medicine

## 2022-12-03 VITALS — BP 102/78 | HR 92 | Wt >= 6400 oz

## 2022-12-03 DIAGNOSIS — Z79899 Other long term (current) drug therapy: Secondary | ICD-10-CM | POA: Diagnosis not present

## 2022-12-03 DIAGNOSIS — Z7984 Long term (current) use of oral hypoglycemic drugs: Secondary | ICD-10-CM | POA: Diagnosis not present

## 2022-12-03 DIAGNOSIS — I493 Ventricular premature depolarization: Secondary | ICD-10-CM | POA: Insufficient documentation

## 2022-12-03 DIAGNOSIS — F17210 Nicotine dependence, cigarettes, uncomplicated: Secondary | ICD-10-CM | POA: Diagnosis not present

## 2022-12-03 DIAGNOSIS — F1110 Opioid abuse, uncomplicated: Secondary | ICD-10-CM | POA: Insufficient documentation

## 2022-12-03 DIAGNOSIS — I110 Hypertensive heart disease with heart failure: Secondary | ICD-10-CM | POA: Diagnosis not present

## 2022-12-03 DIAGNOSIS — R000 Tachycardia, unspecified: Secondary | ICD-10-CM | POA: Insufficient documentation

## 2022-12-03 DIAGNOSIS — I5022 Chronic systolic (congestive) heart failure: Secondary | ICD-10-CM | POA: Diagnosis not present

## 2022-12-03 DIAGNOSIS — F32A Depression, unspecified: Secondary | ICD-10-CM | POA: Diagnosis not present

## 2022-12-03 DIAGNOSIS — Z6841 Body Mass Index (BMI) 40.0 and over, adult: Secondary | ICD-10-CM | POA: Diagnosis not present

## 2022-12-03 DIAGNOSIS — G4733 Obstructive sleep apnea (adult) (pediatric): Secondary | ICD-10-CM

## 2022-12-03 DIAGNOSIS — R0609 Other forms of dyspnea: Secondary | ICD-10-CM | POA: Insufficient documentation

## 2022-12-03 LAB — BASIC METABOLIC PANEL
Anion gap: 9 (ref 5–15)
BUN: 11 mg/dL (ref 6–20)
CO2: 29 mmol/L (ref 22–32)
Calcium: 9 mg/dL (ref 8.9–10.3)
Chloride: 99 mmol/L (ref 98–111)
Creatinine, Ser: 1.01 mg/dL (ref 0.61–1.24)
GFR, Estimated: >60 mL/min (ref >60)
Glucose, Bld: 98 mg/dL (ref 70–99)
Potassium: 3.1 mmol/L — ABNORMAL LOW (ref 3.5–5.1)
Sodium: 137 mmol/L (ref 135–145)

## 2022-12-03 LAB — BRAIN NATRIURETIC PEPTIDE: B Natriuretic Peptide: 95.4 pg/mL (ref 0.0–100.0)

## 2022-12-03 MED ORDER — SPIRONOLACTONE 25 MG PO TABS
25.0000 mg | ORAL_TABLET | Freq: Every day | ORAL | 3 refills | Status: DC
  Filled 2022-12-03: qty 90, 90d supply, fill #0
  Filled 2023-04-19 – 2023-04-20 (×2): qty 90, 90d supply, fill #1
  Filled 2023-08-15: qty 90, 90d supply, fill #2

## 2022-12-03 MED ORDER — DAPAGLIFLOZIN PROPANEDIOL 10 MG PO TABS
10.0000 mg | ORAL_TABLET | Freq: Every day | ORAL | 11 refills | Status: DC
  Filled 2022-12-03: qty 30, 30d supply, fill #0
  Filled 2023-04-19 – 2023-04-20 (×2): qty 30, 30d supply, fill #1
  Filled 2023-06-11: qty 30, 30d supply, fill #2
  Filled 2023-08-01 – 2023-08-20 (×3): qty 30, 30d supply, fill #3

## 2022-12-03 NOTE — Progress Notes (Signed)
ADVANCED HF CLINIC NOTE  Primary Care: Patient, [name] Primary Cardiologist: Olga Millers, MD HF Cardiologist: Dr. Gala Romney  HPI: Donald Murray is a morbidly obese 37 y.o. male with HTN, previous IVDA (heroin), tobacco use and systolic HF (onset 9/21) referred by Dr. Jens Som for further management of his HF.   Echo 10/20 EF 60-65%   Admitted to [facility] 9/21 with new onset HF in setting of severe HTN 174/128. ECG with sinus tach and frequent PVCs.  Echo EF 20-25% Moderate RV dysfunction.   Dr. Gala Romney saw him in HF Consultation for the first time 04/24/20. 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 Zio placed to quantify PVC burden - 4.3%.  Admitted 12/22 with ADHF. Echo LVEF <20% w/ global HK, no visible thrombus, RV okay, mild MR.  Underwent cath 06/12/21 with normal cors. EF < 10% Elevated filling pressures (PCWP 28) and low output (CI 1.5). Lasix increased to 80 bid.  Sleep study 02/23: Severe OSA, moderate central sleep apnea. Waiting for CPAP machine, reports he was told there is a backorder on supplies.  Echo 09/24/21: EF 20-25% RV moderately HK  Seen by Dr. Graciela Husbands in 7/23 and planning ICD. But hasn't been done due to scheduling issues.   Echo 2/24: EF 25-30%. RV moderately HK  Today he returns for HF follow up. Works at [employer] and [employer]. Says he feels pretty good. Back on meds now. Feels his fluid is better. Moderate exertional dyspnea. NYHA II-III  Cardiac Studies: - Echo 2/24: EF 25-30%. RV moderately HK  - CPX test 07/18/21 FVC 5.12  (92%)      FEV1 4.34  (96%)        FEV1/FVC 85   (103%)        MVV 164  (93%)   Resting HR: 126 Standing HR:125 Peak HR: 175   (95% age predicted max HR)  BP rest (standing): 92/70 BP peak: 148/72  Peak VO2: 21.6 (87% predicted peak VO2)  When adjusted to the patient's ideal body weight of 180.3 lb (81.8 kg) the peak VO2 is 41.6 ml/kg (ibw)/min (99% of the ibw-adjusted predicted).  VE/VCO2 slope:  45  VE/MVV:  92%  O2pulse:   19 (90% predicted O2pulse)    - RHC 1/23 Ao = 91/72 (81) LV = 108/37 RA = 9 RV = 51/15 PA = 66/38 (50) PCW = 28 Fick cardiac output/index = 3.9/1.5 PVR = 5.7 SVR = 1,484 Ao sat = 98% PA sat = 55%, 54% PAPI = 3.1  - Zio 12/21 Sinus - average HR 104 4.3% PVCs  Past Medical History:  Diagnosis Date   CHF (congestive heart failure) (HCC)    Hypertension    Obesity    Snoring    Current Outpatient Medications  Medication Sig Dispense Refill   digoxin (LANOXIN) 0.125 MG tablet Take 1 tablet (0.125 mg total) by mouth daily. 90 tablet 3   ivabradine (CORLANOR) 7.5 MG TABS tablet Take 1 tablet by mouth 2 times daily with a meal. 60 tablet 11   metolazone (ZAROXOLYN) 2.5 MG tablet Take 1 tablet (2.5 mg total) by mouth once a week on Wednesdays with 2 extra tablets of potassium 30 tablet 1   metoprolol succinate (TOPROL XL) 25 MG 24 hr tablet Take 1 tablet (25 mg total) by mouth at bedtime. 30 tablet 6   potassium chloride SA (KLOR-CON M) 20 MEQ tablet Take 4 tablets (80 mEq total) by mouth daily. 260 tablet 3   sacubitril-valsartan (ENTRESTO) 49-51 MG  Take 1 tablet by mouth 2 (two) times daily. 180 tablet 11   spironolactone (ALDACTONE) 25 MG tablet Take 25 mg by mouth daily.     torsemide (DEMADEX) 20 MG tablet Take 2 tablets (40 mg total) by mouth 2 (two) times daily. 360 tablet 3   dapagliflozin propanediol (FARXIGA) 10 MG TABS tablet Take 1 tablet (10 mg) by mouth daily before breakfast. (Patient not taking: Reported on 12/03/2022) 90 tablet 3   No current facility-administered medications for this encounter.   No Known Allergies  Social History   Socioeconomic History   Marital status: Single    Spouse name: Not on file   Number of children: Not on file   Years of education: Not on file   Highest education level: Not on file  Occupational History   Not on file  Tobacco Use   Smoking status: Every Day    Current packs/day: 1.00    Average packs/day: 1 pack/day for  16.0 years (16.0 ttl pk-yrs)    Types: Cigarettes   Smokeless tobacco: Never  Vaping Use   Vaping status: Some Days   Substances: Nicotine  Substance and Sexual Activity   Alcohol use: Not Currently    Comment: Pt stated "2 years clean"   Drug use: Not Currently    Comment: Pt stated "It was opiates"   Sexual activity: Not on file  Other Topics Concern   Not on file  Social History Narrative   Not on file   Social Determinants of Health   Financial Resource Strain: High Risk (04/08/2022)   Overall Financial Resource Strain (CARDIA)    Difficulty of Paying Living Expenses: Very hard  Food Insecurity: No Food Insecurity (01/17/2021)   Hunger Vital Sign    Worried About Running Out of Food in the Last Year: Never true    Ran Out of Food in the Last Year: Never true  Transportation Needs: No Transportation Needs (01/17/2021)   PRAPARE - Administrator, Civil Service (Medical): No    Lack of Transportation (Non-Medical): No  Physical Activity: Not on file  Stress: Not on file  Social Connections: Not on file  Intimate Partner Violence: Not on file   Family History  Problem Relation Age of Onset   Hypertension Mother    Hypertension Father     BP 102/78   Pulse 92   Wt (!) 183.3 kg (404 lb)   SpO2 95%   BMI 56.35 kg/m   Wt Readings from Last 3 Encounters:  12/03/22 (!) 183.3 kg (404 lb)  10/20/22 (!) 182.7 kg (402 lb 12.8 oz)  07/22/22 (!) 175.3 kg (386 lb 6.4 oz)    PHYSICAL EXAM: General:  Well appearing. No resp difficulty HEENT: normal Neck: supple. no JVD. Carotids 2+ bilat; no bruits. No lymphadenopathy or thryomegaly appreciated. Cor: PMI nondisplaced. Regular rate & rhythm. No rubs, gallops or murmurs. Lungs: clear Abdomen: obese soft, nontender, nondistended. No hepatosplenomegaly. No bruits or masses. Good bowel sounds. Extremities: no cyanosis, clubbing, rash, edema Neuro: alert & orientedx3, cranial nerves grossly intact. moves all 4  extremities w/o difficulty. Affect pleasant   ASSESSMENT & PLAN:  1. Chronic Systolic HF - diagnosed 9/21 Echo EF 20-25% RV moderately HK - suspect HTN vs PVC-mediated (PVC burden 4.3% - probably not high enough to cause CM) - cath 1/23 no CAD EF < 10% - CPX 2/23 pVO2: 21.6 (87% predicted peak VO2) corrected for ibw pVO2 41.6 ml/kg (ibw)/min (99% of the ibw-adjusted predicted).  Slope:45 O2pulse:  19 (90% pred) - Echo 09/24/21: EF 20-25% RV moderately HK - Echo 2/24: EF 25-30%. RV moderately HK - Stable NYHA II-III. CPX test reassuring VO2 but slope high and he remains tachycardic so I am concerned - Volume looks ok - Continue torsemide 40 mg bid w/ 80 KCL daily + metolazone once a week w/ extra 40 KCL (Wednesdays) - Restart Farxiga 10 mg daily. (Needs to see pharmD) - Continue Entresto 49/51 mg bid (cut back due to fatigue). - Continue digoxin 0.125 mg daily. - Refill spiro 25 mg daily. - Continue Toprol 25 mg qhs (off carvedilol due to low output) - Continue Ivabradine 7.5 mg bid. - Continue to follow for need for advanced therapies. We discussed the potential for VAD again but weight is getting quite high. We checked Hgba1c but only 5.9 so hard to get GLP1RA currently - Body mass index is 56.35 kg/m. - Seen by Dr. Graciela Husbands. Planning for ICD implant down the road -> need to be compliant with meds first. Hopefully soon  - Labs today  2. HTN, severe - Blood pressure well controlled.  - Meds as above  3. Frequent PVCs - Zio 12/21 4.3% PVCs - probably not high enough burden to cause CM  - Has severe OSA. Awaiting CPAP equipment but cannot afford. Will have SW see today.  4. IVDA (heroin) - Reports no use since 12/20.  5. Morbid obesity - Body mass index is 56.35 kg/m. - Weight continues to climb - He has been referred to [facility], but cost barrier. - Weight now > 400 pounds. We checked Hgba1c but only 5.9 so hard to get GLP1RA currently  6. Sleep apnea - Severe  obstructive, moderate central - Not on CPAP yet. Cannot afford. - HFSW helping.  7. Tobacco use - Smoking 1/2 ppd.  - Discussed need for cessation  8. Depression - Seems to be doing better   Arvilla Meres MD 10:12 AM

## 2022-12-03 NOTE — Patient Instructions (Signed)
There has been no changes to your medications.  Labs done today, your results will be available in MyChart, we will contact you for abnormal readings.  Your physician recommends that you schedule a follow-up appointment in: 3 months ( October) ** please call the office in [facility] August to arrange your follow up appointment. **  If you have any questions or concerns before your next appointment please send [place] a message through [place] or call our office at [phone].    TO LEAVE A MESSAGE FOR THE NURSE SELECT OPTION 2, PLEASE LEAVE A MESSAGE INCLUDING: YOUR NAME DATE OF BIRTH CALL BACK NUMBER REASON FOR CALL**this is important as we prioritize the call backs  YOU WILL RECEIVE A CALL BACK THE SAME DAY AS LONG AS YOU CALL BEFORE 4:00 PM  At the [facility], you and your health needs are our priority. As part of our continuing mission to provide you with exceptional heart care, we have created designated Provider Care Teams. These Care Teams include your primary Cardiologist (physician) and Advanced Practice Providers (APPs- Physician Assistants and Nurse Practitioners) who all work together to provide you with the care you need, when you need it.   You may see any of the following providers on your designated Care Team at your next follow up: Dr Arvilla Meres Dr Marca Ancona Dr. Marcos Eke, NP Robbie Lis, [place] [facility] [place], [place] Brynda Peon, NP Karle Plumber, PharmD   Please be sure to bring in all your medications bottles to every appointment.    Thank you for choosing [facility]

## 2022-12-03 NOTE — Telephone Encounter (Signed)
Advanced Heart Failure Patient Advocate Encounter  Prior authorization for Marcelline Deist has been submitted and approved. Test billing returns $35 for 30 day supply. Patient provided with copay card in office.  Key: [id number] Effective: 12/03/2022 to 12/02/2025  Burnell Blanks, CPhT Rx Patient Advocate Phone: [phone]

## 2023-01-14 ENCOUNTER — Encounter (HOSPITAL_COMMUNITY): Payer: BC Managed Care – PPO | Admitting: Internal Medicine

## 2023-02-14 ENCOUNTER — Encounter: Payer: Self-pay | Admitting: Emergency Medicine

## 2023-02-14 ENCOUNTER — Ambulatory Visit
Admission: EM | Admit: 2023-02-14 | Discharge: 2023-02-14 | Disposition: A | Discharge status: Home / Self Care | Payer: BC Managed Care – PPO | Attending: Family Medicine | Admitting: Family Medicine

## 2023-02-14 ENCOUNTER — Other Ambulatory Visit: Payer: Self-pay

## 2023-02-14 DIAGNOSIS — L03115 Cellulitis of right lower limb: Secondary | ICD-10-CM

## 2023-02-14 MED ORDER — AMOXICILLIN-POT CLAVULANATE 875-125 MG PO TABS: 1.0000 | ORAL_TABLET | Freq: Two times a day (BID) | ORAL | 0 refills | Status: AC

## 2023-02-14 NOTE — ED Provider Notes (Signed)
[facility]    CSN: [id number] Arrival date & time: 02/14/23  1505      History   Chief Complaint Chief Complaint  Patient presents with   Leg Pain    HPI Mikio Verhaeghe is a 37 y.o. male.   HPI 37 year old male presents with tattoo pain of right calf for 1 month.  Patient is concerned that it is infected.  PMH significant for morbid obesity, CHF, and HTN.  Past Medical History:  Diagnosis Date   CHF (congestive heart failure) (HCC)    Hypertension    Obesity    Snoring     Patient Active Problem List   Diagnosis Date Noted   OSA (obstructive sleep apnea) 09/29/2021   Acute on chronic systolic heart failure (HCC) 05/21/2021   Acute on chronic heart failure (HCC) 12/29/2020   PVC (premature ventricular contraction) 08/15/2020   Essential hypertension 03/20/2020   Acute congestive heart failure (HCC)    Cellulitis 03/04/2019   IVDU (intravenous drug user) 03/04/2019    Past Surgical History:  Procedure Laterality Date   I & D EXTREMITY Right 03/04/2019   Procedure: IRRIGATION AND DEBRIDEMENT OF RIGHT ELBOW;  Surgeon: Dominica Severin, MD;  Location: [facility] OR;  Service: Orthopedics;  Laterality: Right;   IRRIGATION AND DEBRIDEMENT ABSCESS Left 03/04/2019   Procedure: Irrigation And Debridement Abscess Left hand;  Surgeon: Dominica Severin, MD;  Location: [facility] OR;  Service: Orthopedics;  Laterality: Left;   RIGHT/LEFT HEART CATH AND CORONARY ANGIOGRAPHY N/A 06/12/2021   Procedure: RIGHT/LEFT HEART CATH AND CORONARY ANGIOGRAPHY;  Surgeon: Dolores Patty, MD;  Location: [facility] INVASIVE CV LAB;  Service: Cardiovascular;  Laterality: N/A;   UMBILICAL HERNIA REPAIR         Home Medications    Prior to Admission medications   Medication Sig Start Date End Date Taking? Authorizing Provider  amoxicillin-clavulanate (AUGMENTIN) 875-125 MG tablet Take 1 tablet by mouth 2 (two) times daily for 10 days. 02/14/23 02/24/23 Yes Trevor Iha, FNP  dapagliflozin propanediol  (FARXIGA) 10 MG TABS tablet Take 1 tablet (10 mg) by mouth daily before breakfast. 12/03/22   Bensimhon, Bevelyn Buckles, MD  digoxin (LANOXIN) 0.125 MG tablet Take 1 tablet (0.125 mg total) by mouth daily. 11/02/22   Bensimhon, Bevelyn Buckles, MD  ivabradine (CORLANOR) 7.5 MG TABS tablet Take 1 tablet by mouth 2 times daily with a meal. 11/02/22   Bensimhon, Bevelyn Buckles, MD  metolazone (ZAROXOLYN) 2.5 MG tablet Take 1 tablet (2.5 mg total) by mouth once a week on Wednesdays with 2 extra tablets of potassium 07/09/21   Bensimhon, Bevelyn Buckles, MD  metoprolol succinate (TOPROL XL) 25 MG 24 hr tablet Take 1 tablet (25 mg total) by mouth at bedtime. 03/31/22   Bensimhon, Bevelyn Buckles, MD  potassium chloride SA (KLOR-CON M) 20 MEQ tablet Take 4 tablets (80 mEq total) by mouth daily. 09/24/21   Bensimhon, Bevelyn Buckles, MD  sacubitril-valsartan (ENTRESTO) 49-51 MG Take 1 tablet by mouth 2 (two) times daily. 12/31/21   Bensimhon, Bevelyn Buckles, MD  spironolactone (ALDACTONE) 25 MG tablet Take 1 tablet (25 mg total) by mouth daily. 12/03/22   Bensimhon, Bevelyn Buckles, MD  torsemide (DEMADEX) 20 MG tablet Take 2 tablets (40 mg total) by mouth 2 (two) times daily. 06/30/21 07/23/23  Bensimhon, Bevelyn Buckles, MD    Family History Family History  Problem Relation Age of Onset   Hypertension Mother    Hypertension Father     Social History Social History   Tobacco  Use   Smoking status: Every Day    Current packs/day: 1.00    Average packs/day: 1 pack/day for 16.0 years (16.0 ttl pk-yrs)    Types: Cigarettes   Smokeless tobacco: Never  Vaping Use   Vaping status: Some Days   Substances: Nicotine  Substance Use Topics   Alcohol use: Not Currently    Comment: Pt stated "2 years clean"   Drug use: Not Currently    Comment: Pt stated "It was opiates"     Allergies   Patient has no known allergies.   Review of Systems Review of Systems   Physical Exam Triage Vital Signs ED Triage Vitals  Encounter Vitals Group     BP      Systolic BP  Percentile      Diastolic BP Percentile      Pulse      Resp      Temp      Temp src      SpO2      Weight      Height      Head Circumference      Peak Flow      Pain Score      Pain Loc      Pain Education      Exclude from Growth Chart    No data found.  Updated Vital Signs BP (!) 149/99 (BP Location: Right Arm) Comment: "smoked just a minute ago"  Pulse (!) 103   Temp 98.3 F (36.8 C) (Oral)   Resp 18   SpO2 98%      Physical Exam Vitals and nursing note reviewed.  Constitutional:      Appearance: Normal appearance. He is normal weight.  HENT:     Head: Normocephalic and atraumatic.     Mouth/Throat:     Mouth: Mucous membranes are moist.     Pharynx: Oropharynx is clear.  Eyes:     Extraocular Movements: Extraocular movements intact.     Conjunctiva/sclera: Conjunctivae normal.     Pupils: Pupils are equal, round, and reactive to light.  Cardiovascular:     Rate and Rhythm: Normal rate and regular rhythm.     Pulses: Normal pulses.     Heart sounds: Normal heart sounds.  Pulmonary:     Effort: Pulmonary effort is normal.     Breath sounds: Normal breath sounds.  Musculoskeletal:        General: Normal range of motion.     Cervical back: Normal range of motion and neck supple.  Skin:    General: Skin is warm and dry.     Comments: Right lower leg: Moderate erythema noted around tattoo that was placed 1 month ago please see image below  Neurological:     General: No focal deficit present.     Mental Status: He is alert and oriented to person, place, and time. Mental status is at baseline.      UC Treatments / Results  Labs (all labs ordered are listed, but only abnormal results are displayed) Labs Reviewed - No data to display  EKG   Radiology No results found.  Procedures Procedures (including critical care time)  Medications Ordered in UC Medications - No data to display  Initial Impression / Assessment and Plan / UC Course  I have  reviewed the triage vital signs and the nursing notes.  Pertinent labs & imaging results that were available during my care of the patient were reviewed by me and considered  in my medical decision making (see chart for details).     MDM: 1.  Cellulitis of right lower leg-Rx'd Augmentin 875/125 mg tablet twice daily x 10 days. Instructed patient to take medication as directed with food to completion.  Encouraged to increase daily water intake to 64 ounces per day while taking this medication.  Advised if symptoms worsen and/or unresolved please follow-up with PCP or here for further evaluation.  Patient discharged home, hemodynamically stable. Final Clinical Impressions(s) / UC Diagnoses   Final diagnoses:  Cellulitis of right lower leg     Discharge Instructions      Instructed patient to take medication as directed with food to completion.  Encouraged to increase daily water intake to 64 ounces per day while taking this medication.  Advised if symptoms worsen and/or unresolved please follow-up with PCP or here for further evaluation.     ED Prescriptions     Medication Sig Dispense Auth. Provider   amoxicillin-clavulanate (AUGMENTIN) 875-125 MG tablet Take 1 tablet by mouth 2 (two) times daily for 10 days. 20 tablet Trevor Iha, FNP      PDMP not reviewed this encounter.   Trevor Iha, FNP 02/14/23 1605

## 2023-02-14 NOTE — ED Triage Notes (Signed)
Pt states he got a tattoo on his right calf about 1 month ago and it has been painful since he got it but getting worse now. He is concerned it may be infected. No drainage or bleeding. Painful to touch. Using lotion for it

## 2023-02-14 NOTE — Discharge Instructions (Addendum)
Instructed patient to take medication as directed with food to completion.  Encouraged to increase daily water intake to 64 ounces per day while taking this medication.  Advised if symptoms worsen and/or unresolved please follow-up with PCP or here for further evaluation.

## 2023-03-15 ENCOUNTER — Encounter (HOSPITAL_COMMUNITY): Payer: BC Managed Care – PPO | Admitting: Internal Medicine

## 2023-03-17 ENCOUNTER — Ambulatory Visit (HOSPITAL_COMMUNITY)
Admission: RE | Admit: 2023-03-17 | Discharge: 2023-03-17 | Disposition: A | Discharge status: Home / Self Care | Payer: BC Managed Care – PPO | Source: Ambulatory Visit | Attending: Internal Medicine | Admitting: Internal Medicine

## 2023-03-17 ENCOUNTER — Other Ambulatory Visit (HOSPITAL_COMMUNITY): Payer: Self-pay

## 2023-03-17 ENCOUNTER — Encounter (HOSPITAL_COMMUNITY): Payer: Self-pay | Admitting: Internal Medicine

## 2023-03-17 VITALS — BP 122/94 | HR 97 | Wt 397.6 lb

## 2023-03-17 DIAGNOSIS — F32A Depression, unspecified: Secondary | ICD-10-CM | POA: Insufficient documentation

## 2023-03-17 DIAGNOSIS — G4733 Obstructive sleep apnea (adult) (pediatric): Secondary | ICD-10-CM | POA: Insufficient documentation

## 2023-03-17 DIAGNOSIS — F17210 Nicotine dependence, cigarettes, uncomplicated: Secondary | ICD-10-CM | POA: Diagnosis not present

## 2023-03-17 DIAGNOSIS — F1111 Opioid abuse, in remission: Secondary | ICD-10-CM | POA: Diagnosis not present

## 2023-03-17 DIAGNOSIS — I5022 Chronic systolic (congestive) heart failure: Secondary | ICD-10-CM | POA: Insufficient documentation

## 2023-03-17 DIAGNOSIS — I493 Ventricular premature depolarization: Secondary | ICD-10-CM | POA: Insufficient documentation

## 2023-03-17 DIAGNOSIS — R000 Tachycardia, unspecified: Secondary | ICD-10-CM | POA: Diagnosis not present

## 2023-03-17 DIAGNOSIS — I110 Hypertensive heart disease with heart failure: Secondary | ICD-10-CM | POA: Diagnosis not present

## 2023-03-17 DIAGNOSIS — Z6841 Body Mass Index (BMI) 40.0 and over, adult: Secondary | ICD-10-CM | POA: Insufficient documentation

## 2023-03-17 DIAGNOSIS — Z720 Tobacco use: Secondary | ICD-10-CM

## 2023-03-17 DIAGNOSIS — I5020 Unspecified systolic (congestive) heart failure: Secondary | ICD-10-CM | POA: Diagnosis not present

## 2023-03-17 LAB — BASIC METABOLIC PANEL
Anion gap: 10 (ref 5–15)
BUN: 10 mg/dL (ref 6–20)
CO2: 21 mmol/L — ABNORMAL LOW (ref 22–32)
Calcium: 9.1 mg/dL (ref 8.9–10.3)
Chloride: 108 mmol/L (ref 98–111)
Creatinine, Ser: 0.89 mg/dL (ref 0.61–1.24)
GFR, Estimated: >60 mL/min (ref >60)
Glucose, Bld: 82 mg/dL (ref 70–99)
Potassium: 3.9 mmol/L (ref 3.5–5.1)
Sodium: 139 mmol/L (ref 135–145)

## 2023-03-17 LAB — DIGOXIN LEVEL: Digoxin Level: <0.2 ng/mL — ABNORMAL LOW (ref 0.8–2.0)

## 2023-03-17 LAB — BRAIN NATRIURETIC PEPTIDE: B Natriuretic Peptide: 510.2 pg/mL — ABNORMAL HIGH (ref 0.0–100.0)

## 2023-03-17 MED ORDER — ENTRESTO 97-103 MG PO TABS
1.0000 | ORAL_TABLET | Freq: Two times a day (BID) | ORAL | 6 refills | Status: DC
  Filled 2023-03-17 – 2023-06-11 (×2): qty 60, 30d supply, fill #0

## 2023-03-17 NOTE — Patient Instructions (Signed)
Medication Changes:  Increase Entresto to 97/103 mg Twice daily   Lab Work:  Labs done today, your results will be available in MyChart, we will contact you for abnormal readings.  Testing/Procedures:  Your physician has requested that you have an echocardiogram. Echocardiography is a painless test that uses sound waves to create images of your heart. It provides your doctor with information about the size and shape of your heart and how well your heart's chambers and valves are working. This procedure takes approximately one hour. There are no restrictions for this procedure. Please do NOT wear cologne, perfume, aftershave, or lotions (deodorant is allowed). Please arrive 15 minutes prior to your appointment time.   Referrals:  You have been referred to Pharmacy Clinic for weight loss medication, they will call you  Special Instructions // Education:  Do the following things EVERYDAY: Weigh yourself in the morning before breakfast. Write it down and keep it in a log. Take your medicines as prescribed Eat low salt foods--Limit salt (sodium) to 2000 mg per day.  Stay as active as you can everyday Limit all fluids for the day to less than 2 liters   Follow-Up in: 3-4 months   At the [facility], you and your health needs are our priority. We have a designated team specialized in the treatment of Heart Failure. This Care Team includes your primary Heart Failure Specialized Cardiologist (physician), Advanced Practice Providers (APPs- Physician Assistants and Nurse Practitioners), and Pharmacist who all work together to provide you with the care you need, when you need it.   You may see any of the following providers on your designated Care Team at your next follow up:  Dr. Arvilla Meres Dr. Marca Ancona Dr. Dorthula Nettles Dr. Theresia Bough Tonye Becket, NP Robbie Lis, [place] [facility] [place], [place] Brynda Peon, NP [place] Lee, NP Karle Plumber,  PharmD   Please be sure to bring in all your medications bottles to every appointment.   Need to Contact us:  If you have any questions or concerns before your next appointment please send [place] a message through [place] or call our office at [phone].    TO LEAVE A MESSAGE FOR THE NURSE SELECT OPTION 2, PLEASE LEAVE A MESSAGE INCLUDING: YOUR NAME DATE OF BIRTH CALL BACK NUMBER REASON FOR CALL**this is important as we prioritize the call backs  YOU WILL RECEIVE A CALL BACK THE SAME DAY AS LONG AS YOU CALL BEFORE 4:00 PM

## 2023-03-17 NOTE — Progress Notes (Signed)
ADVANCED HF CLINIC NOTE  Primary Care: Patient, [name] Primary Cardiologist: Olga Millers, MD HF Cardiologist: Dr. Gala Romney  HPI: Donald Murray is a morbidly obese 37 y.o. male with HTN, previous IVDA (heroin), tobacco use and systolic HF (onset 9/21) referred by Dr. Jens Som for further management of his HF.   Echo 10/20 EF 60-65%   Ad[facility] to [facility] 9/21 with new onset HF in setting of severe HTN 174/128. ECG with sinus tach and frequent PVCs. Echo EF 20-25% Moderate RV dysfunction.   HF Consultation for the first time 04/24/20. 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 Zio placed to quantify PVC burden - 4.3%.  Admitted 12/22 with ADHF. Echo LVEF <20% w/ global HK, no visible thrombus, RV okay, mild MR.  Cath 06/12/21 with normal cors. EF < 10% Elevated filling pressures (PCWP 28) and low output (CI 1.5). Lasix increased to 80 bid.  Sleep study 02/23: Severe OSA, moderate central sleep apnea. Waiting for CPAP machine, reports he was told there is a backorder on supplies.  Echo 09/24/21: EF 20-25% RV moderately HK  Seen by Dr. Graciela Husbands in 7/23 and planning ICD. But hasn't been done due to scheduling issues.   Echo 2/24: EF 25-30%. RV moderately HK  Today he returns for HF follow up. Works at [employer]. Says these are the best 3 months he has had in a long time. Went to [place] and [place]. Has not required any diuretics except for a day town. Complaint with meds. No CP or undue SOB.   Cardiac Studies: - Echo 2/24: EF 25-30%. RV moderately HK  - CPX test 07/18/21 FVC 5.12  (92%)      FEV1 4.34  (96%)        FEV1/FVC 85   (103%)        MVV 164  (93%)   Resting HR: 126 Standing HR:125 Peak HR: 175   (95% age predicted max HR)  BP rest (standing): 92/70 BP peak: 148/72  Peak VO2: 21.6 (87% predicted peak VO2)  When adjusted to the patient's ideal body weight of 180.3 lb (81.8 kg) the peak VO2 is 41.6 ml/kg (ibw)/min (99% of the ibw-adjusted predicted).  VE/VCO2 slope:  45   VE/MVV:  92%  O2pulse:  19 (90% predicted O2pulse)    - RHC 1/23 Ao = 91/72 (81) LV = 108/37 RA = 9 RV = 51/15 PA = 66/38 (50) PCW = 28 Fick cardiac output/index = 3.9/1.5 PVR = 5.7 SVR = 1,484 Ao sat = 98% PA sat = 55%, 54% PAPI = 3.1  - Zio 12/21 Sinus - average HR 104 4.3% PVCs  Past Medical History:  Diagnosis Date   CHF (congestive heart failure) (HCC)    Hypertension    Obesity    Snoring    Current Outpatient Medications  Medication Sig Dispense Refill   dapagliflozin propanediol (FARXIGA) 10 MG TABS tablet Take 1 tablet (10 mg) by mouth daily before breakfast. 30 tablet 11   digoxin (LANOXIN) 0.125 MG tablet Take 1 tablet (0.125 mg total) by mouth daily. 90 tablet 3   ivabradine (CORLANOR) 7.5 MG TABS tablet Take 1 tablet by mouth 2 times daily with a meal. 60 tablet 11   metoprolol succinate (TOPROL XL) 25 MG 24 hr tablet Take 1 tablet (25 mg total) by mouth at bedtime. 30 tablet 6   potassium chloride SA (KLOR-CON M) 20 MEQ tablet Take 4 tablets (80 mEq total) by mouth daily. 260 tablet 3   sacubitril-valsartan (  ENTRESTO) 49-51 MG Take 1 tablet by mouth 2 (two) times daily. 180 tablet 11   spironolactone (ALDACTONE) 25 MG tablet Take 1 tablet (25 mg total) by mouth daily. 90 tablet 3   No current facility-administered medications for this encounter.   No Known Allergies  Social History   Socioeconomic History   Marital status: Single    Spouse name: Not on file   Number of children: Not on file   Years of education: Not on file   Highest education level: Not on file  Occupational History   Not on file  Tobacco Use   Smoking status: Every Day    Current packs/day: 1.00    Average packs/day: 1 pack/day for 16.0 years (16.0 ttl pk-yrs)    Types: Cigarettes   Smokeless tobacco: Never  Vaping Use   Vaping status: Some Days   Substances: Nicotine  Substance and Sexual Activity   Alcohol use: Not Currently    Comment: Pt stated "2 years clean"    Drug use: Not Currently    Comment: Pt stated "It was opiates"   Sexual activity: Not on file  Other Topics Concern   Not on file  Social History Narrative   Not on file   Social Determinants of Health   Financial Resource Strain: High Risk (04/08/2022)   Overall Financial Resource Strain (CARDIA)    Difficulty of Paying Living Expenses: Very hard  Food Insecurity: No Food Insecurity (01/17/2021)   Hunger Vital Sign    Worried About Running Out of Food in the Last Year: Never true    Ran Out of Food in the Last Year: Never true  Transportation Needs: No Transportation Needs (01/17/2021)   PRAPARE - Administrator, Civil Service (Medical): No    Lack of Transportation (Non-Medical): No  Physical Activity: Not on file  Stress: Not on file  Social Connections: Not on file  Intimate Partner Violence: Not on file   Family History  Problem Relation Age of Onset   Hypertension Mother    Hypertension Father     BP (!) 122/94   Pulse 97   Wt (!) 180.4 kg (397 lb 9.6 oz)   SpO2 98%   BMI 55.45 kg/m   Wt Readings from Last 3 Encounters:  03/17/23 (!) 180.4 kg (397 lb 9.6 oz)  12/03/22 (!) 183.3 kg (404 lb)  10/20/22 (!) 182.7 kg (402 lb 12.8 oz)    PHYSICAL EXAM: General:  Well appearing. No resp difficulty HEENT: normal Neck: supple. no JVD. Carotids 2+ bilat; no bruits. No lymphadenopathy or thryomegaly appreciated. Cor: PMI nondisplaced. Regular rate & rhythm. No rubs, gallops or murmurs. Lungs: clear Abdomen: obese soft, nontender, nondistended. No hepatosplenomegaly. No bruits or masses. Good bowel sounds. Extremities: no cyanosis, clubbing, rash, edema Neuro: alert & orientedx3, cranial nerves grossly intact. moves all 4 extremities w/o difficulty. Affect pleasant   ASSESSMENT & PLAN:  1. Chronic Systolic HF - diagnosed 9/21 Echo EF 20-25% RV moderately HK - suspect HTN vs PVC-mediated (PVC burden 4.3% - probably not high enough to cause CM) - cath 1/23  no CAD EF < 10% - CPX 2/23 pVO2: 21.6 (87% predicted peak VO2) corrected for ibw pVO2 41.6 ml/kg (ibw)/min (99% of the ibw-adjusted predicted). Slope:45 O2pulse:  19 (90% pred) - Echo 09/24/21: EF 20-25% RV moderately HK - Echo 2/24: EF 25-30%. RV moderately HK - Much improved NYHA I-II. CPX test reassuring VO2 but slope high and he remains tachycardic so I  am concerned - Volume ok - Now off torsemide 40 mg bid w/ 80 KCL daily + metolazone once a week. Encouraged him to take asneeded - Continue Farxiga - OnEntresto 49/51 mg bid (cut back due to fatigue). Will go back to 97/103 bid and see if he tolerates - Continue digoxin 0.125 mg daily. - Continue spiro 25 mg daily. - Continue Toprol 25 mg qhs (off carvedilol due to low output) - Continue Ivabradine 7.5 mg bid. - Continue to follow for need for advanced therapies. We discussed the potential for VAD again but weight is getting quite high. We checked Hgba1c but only 5.9 so hard to get GLP1RA currently - Body mass index is 55.45 kg/m. - Seen by Dr. Graciela Husbands. Planning for ICD implant down the road -> need to be compliant with meds first. Compliance much better. Will repeat echo to reassess EF first in light of functional improvement - Labs today  2. HTN, severe - Blood pressure well controlled. Continue current regimen. - Meds as above  3. Frequent PVCs - Zio 12/21 4.3% PVCs - probably not high enough burden to cause CM  - Has severe OSA. Awaiting CPAP equipment but cannot afford. Will have SW see today.  4. IVDA (heroin) - Reports no use since 12/20.  5. Morbid obesity - Body mass index is 55.45 kg/m. - Weight continues to climb - He has been referred to [facility], but cost barrier. - Weight now > 400 pounds. Food Eugenia Mcalpine said they will support Wegovy. Will refer to PharmD  6. Sleep apnea - Severe obstructive, moderate central - Not on CPAP yet. Cannot afford. - HFSW helping.  7. Tobacco use - Smoking 1/2-1 ppd.  -  Discussed need for cessation  8. Depression - Seems to be doing better   Arvilla Meres MD 10:42 AM

## 2023-03-30 ENCOUNTER — Other Ambulatory Visit (HOSPITAL_COMMUNITY): Payer: Self-pay

## 2023-03-30 ENCOUNTER — Ambulatory Visit (HOSPITAL_COMMUNITY)
Admission: RE | Admit: 2023-03-30 | Discharge: 2023-03-30 | Disposition: A | Discharge status: Home / Self Care | Payer: BC Managed Care – PPO | Source: Ambulatory Visit | Attending: Internal Medicine | Admitting: Internal Medicine

## 2023-03-30 DIAGNOSIS — I110 Hypertensive heart disease with heart failure: Secondary | ICD-10-CM | POA: Insufficient documentation

## 2023-03-30 DIAGNOSIS — I5022 Chronic systolic (congestive) heart failure: Secondary | ICD-10-CM | POA: Diagnosis not present

## 2023-03-30 LAB — ECHOCARDIOGRAM COMPLETE
Calc EF: 25.9 %
Est EF: 25
S' Lateral: 5.9 cm
Single Plane A2C EF: 24.7 %
Single Plane A4C EF: 27.6 %

## 2023-03-30 MED ORDER — PERFLUTREN LIPID MICROSPHERE
1.0000 mL | INTRAVENOUS | Status: AC | PRN
  Administered 2023-03-30: 2 mL via INTRAVENOUS

## 2023-04-19 ENCOUNTER — Ambulatory Visit: Payer: BC Managed Care – PPO | Attending: Cardiology | Admitting: Pharmacist

## 2023-04-19 ENCOUNTER — Encounter: Payer: Self-pay | Admitting: Pharmacist

## 2023-04-19 ENCOUNTER — Telehealth: Payer: Self-pay | Admitting: Pharmacist

## 2023-04-19 DIAGNOSIS — E66813 Obesity, class 3: Secondary | ICD-10-CM | POA: Diagnosis not present

## 2023-04-19 DIAGNOSIS — I5022 Chronic systolic (congestive) heart failure: Secondary | ICD-10-CM | POA: Diagnosis not present

## 2023-04-19 DIAGNOSIS — Z6841 Body Mass Index (BMI) 40.0 and over, adult: Secondary | ICD-10-CM

## 2023-04-19 NOTE — Patient Instructions (Addendum)
It was nice meeting you today  The medications we talked about today are called Wegovy and Zepbound. Both of which are given once a week  I will complete the prior authorizations for you and contact you with the result  Once you start the medication, send me a message when you are close to needing a refill and we will send in the next dose  Let me know if you have any questions  Laural Golden, PharmD, BCACP, CDCES, CPP [address] [place], [place], [address] Phone: [phone], Fax: [phone]

## 2023-04-19 NOTE — Telephone Encounter (Signed)
Please complete PA for Wegovy 0.25

## 2023-04-19 NOTE — Progress Notes (Signed)
Patient ID: Donald Murray                 DOB: December 08, 1985                    MRN: [id number]     HPI: Donald Murray is a 37 y.o. male patient referred to pharmacy clinic by Dr Gala Romney to initiate GLP1-RA therapy. PMH is significant for CHF (last EF 25% on 03/30/23), HTN, PVCs, OSA, and a history of drug use. BMI today 53.97.  Patient presents today to discuss weight loss. Works as Production designer, theatre/television/film at [employer]. Denies CP or SOB.   Admitted for CHF on 08/15/20, 12/29/20, and 05/21/21.  Last A1c 5.9%.No drug use.  Labs: Lab Results  Component Value Date   HGBA1C 5.9 (H) 10/20/2022    Wt Readings from Last 1 Encounters:  03/17/23 (!) 397 lb 9.6 oz (180.4 kg)    BP Readings from Last 1 Encounters:  03/17/23 (!) 122/94   Pulse Readings from Last 1 Encounters:  03/17/23 97    No results found for: "CHOL", "TRIG", "HDL", "CHOLHDL", "VLDL", "LDLCALC", "LDLDIRECT"  Past Medical History:  Diagnosis Date   CHF (congestive heart failure) (HCC)    Hypertension    Obesity    Snoring     Current Outpatient Medications on File Prior to Visit  Medication Sig Dispense Refill   dapagliflozin propanediol (FARXIGA) 10 MG TABS tablet Take 1 tablet (10 mg) by mouth daily before breakfast. 30 tablet 11   digoxin (LANOXIN) 0.125 MG tablet Take 1 tablet (0.125 mg total) by mouth daily. 90 tablet 3   ivabradine (CORLANOR) 7.5 MG TABS tablet Take 1 tablet by mouth 2 times daily with a meal. 60 tablet 11   metoprolol succinate (TOPROL XL) 25 MG 24 hr tablet Take 1 tablet (25 mg total) by mouth at bedtime. 30 tablet 6   potassium chloride SA (KLOR-CON M) 20 MEQ tablet Take 4 tablets (80 mEq total) by mouth daily. 260 tablet 3   sacubitril-valsartan (ENTRESTO) 97-103 MG Take 1 tablet by mouth 2 (two) times daily. 60 tablet 6   spironolactone (ALDACTONE) 25 MG tablet Take 1 tablet (25 mg total) by mouth daily. 90 tablet 3   No current facility-administered medications on file prior to visit.    No Known  Allergies   Assessment/Plan:  1. Weight loss - Patient BMI today 53.97 placing him in class III obesity. Would benefit from [facility] therapy. Confirmed patient has no personal or family history of medullary thyroid carcinoma (MTC) or Multiple Endocrine Neoplasia syndrome type 2 (MEN 2). Injection technique reviewed at today's visit.  Educated patient on mechanism of action of Wegovy and Zepbound. Using demo pen, educated on storage, site selection, and administration.  Advised patient on common side effects including nausea, diarrhea, dyspepsia, decreased appetite, and fatigue. Counseled patient on reducing meal size and how to titrate medication to minimize side effects. Counseled patient to call if intolerable side effects or if experiencing dehydration, abdominal pain, or dizziness. Patient will adhere to dietary modifications and will target at least 150 minutes of moderate intensity exercise weekly.   Will complete PA and contact patient with response.  Laural Golden, PharmD, BCACP, CDCES, CPP [address] [place], [place], [address] Phone: [phone], Fax: [phone]

## 2023-04-20 ENCOUNTER — Encounter: Payer: Self-pay | Admitting: Pharmacist

## 2023-04-20 ENCOUNTER — Other Ambulatory Visit (HOSPITAL_COMMUNITY): Payer: Self-pay

## 2023-04-20 ENCOUNTER — Telehealth: Payer: Self-pay | Admitting: Pharmacy Technician

## 2023-04-20 ENCOUNTER — Other Ambulatory Visit: Payer: Self-pay

## 2023-04-20 DIAGNOSIS — E66813 Obesity, class 3: Secondary | ICD-10-CM

## 2023-04-20 MED ORDER — WEGOVY 0.25 MG/0.5ML ~~LOC~~ SOAJ
0.2500 mg | SUBCUTANEOUS | 0 refills | Status: DC
  Filled 2023-04-20: qty 2, 28d supply, fill #0

## 2023-04-20 NOTE — Telephone Encounter (Signed)
Pharmacy Patient Advocate Encounter  Received notification from [facility] that Prior Authorization for wegovy has been APPROVED from 04/20/23 to 10/18/23. Ran test claim, Copay is $24.99- one month. This test claim was processed through [facility]- copay amounts may vary at other pharmacies due to pharmacy/plan contracts, or as the patient moves through the different stages of their insurance plan.   PA #/Case ID/Reference #: [id number]

## 2023-04-20 NOTE — Telephone Encounter (Signed)
Pharmacy Patient Advocate Encounter   Received notification from Pt Calls Messages that prior authorization for wegovy is required/requested.   Insurance verification completed.   The patient is insured through [facility] .   Per test claim: PA required; PA submitted to above mentioned insurance via CoverMyMeds Key/confirmation #/EOC [id number] Status is pending

## 2023-05-13 ENCOUNTER — Other Ambulatory Visit: Payer: Self-pay

## 2023-05-13 ENCOUNTER — Other Ambulatory Visit (HOSPITAL_COMMUNITY): Payer: Self-pay

## 2023-05-13 MED ORDER — WEGOVY 0.5 MG/0.5ML ~~LOC~~ SOAJ
0.5000 mg | SUBCUTANEOUS | 0 refills | Status: DC
  Filled 2023-05-13: qty 2, 28d supply, fill #0

## 2023-05-13 NOTE — Addendum Note (Signed)
Addended by: Cheree Ditto on: 05/13/2023 07:25 AM   Modules accepted: Orders

## 2023-05-15 ENCOUNTER — Other Ambulatory Visit (HOSPITAL_COMMUNITY): Payer: Self-pay

## 2023-06-12 ENCOUNTER — Other Ambulatory Visit (HOSPITAL_COMMUNITY): Payer: Self-pay

## 2023-06-14 ENCOUNTER — Telehealth: Payer: Self-pay | Admitting: Pharmacy Technician

## 2023-06-14 ENCOUNTER — Other Ambulatory Visit (HOSPITAL_COMMUNITY): Payer: Self-pay

## 2023-06-14 NOTE — Telephone Encounter (Signed)
Sent over BMI documentation

## 2023-06-14 NOTE — Telephone Encounter (Signed)
Pharmacy Patient Advocate Encounter   Received notification from Patient Advice Request messages that prior authorization for wegovy is required/requested.   Insurance verification completed.   The patient is insured through [facility] .   Per test claim: PA required; PA submitted to above mentioned insurance via CoverMyMeds Key/confirmation #/EOC [id number] Status is pending

## 2023-06-15 ENCOUNTER — Other Ambulatory Visit (HOSPITAL_COMMUNITY): Payer: Self-pay

## 2023-06-16 ENCOUNTER — Other Ambulatory Visit (HOSPITAL_COMMUNITY): Payer: Self-pay

## 2023-06-17 ENCOUNTER — Other Ambulatory Visit (HOSPITAL_COMMUNITY): Payer: Self-pay

## 2023-06-18 ENCOUNTER — Other Ambulatory Visit (HOSPITAL_COMMUNITY): Payer: Self-pay

## 2023-06-18 NOTE — Telephone Encounter (Signed)
Ref # [id number]  checked status- pending (under review)

## 2023-06-22 ENCOUNTER — Other Ambulatory Visit (HOSPITAL_COMMUNITY): Payer: Self-pay

## 2023-06-22 NOTE — Telephone Encounter (Signed)
pending

## 2023-06-22 NOTE — Telephone Encounter (Signed)
Received call from insurance regarding [place].  They need documentation that patient was making diet and lifestyle modifications prior to starting [facility].  Will also need current BMI/weight, as must be within 45 days of request.    In the meantime, patient can get transition supply (28d) of wegovy, but must be filled before Jan 31.    Fax above information to insurer for final approval.

## 2023-06-23 ENCOUNTER — Other Ambulatory Visit (HOSPITAL_COMMUNITY): Payer: Self-pay

## 2023-06-24 ENCOUNTER — Telehealth (HOSPITAL_COMMUNITY): Payer: Self-pay

## 2023-06-24 ENCOUNTER — Telehealth (HOSPITAL_COMMUNITY): Payer: Self-pay | Admitting: Cardiology

## 2023-06-24 ENCOUNTER — Other Ambulatory Visit (HOSPITAL_COMMUNITY): Payer: Self-pay

## 2023-06-24 ENCOUNTER — Ambulatory Visit (HOSPITAL_COMMUNITY)
Admission: RE | Admit: 2023-06-24 | Discharge: 2023-06-24 | Disposition: A | Discharge status: Home / Self Care | Payer: BC Managed Care – PPO | Source: Ambulatory Visit | Attending: Adult Health | Admitting: Adult Health

## 2023-06-24 ENCOUNTER — Other Ambulatory Visit: Payer: Self-pay

## 2023-06-24 VITALS — BP 140/110 | HR 136 | Wt 389.0 lb

## 2023-06-24 DIAGNOSIS — R000 Tachycardia, unspecified: Secondary | ICD-10-CM | POA: Diagnosis not present

## 2023-06-24 DIAGNOSIS — I110 Hypertensive heart disease with heart failure: Secondary | ICD-10-CM | POA: Insufficient documentation

## 2023-06-24 DIAGNOSIS — Z6841 Body Mass Index (BMI) 40.0 and over, adult: Secondary | ICD-10-CM | POA: Insufficient documentation

## 2023-06-24 DIAGNOSIS — Z8679 Personal history of other diseases of the circulatory system: Secondary | ICD-10-CM | POA: Insufficient documentation

## 2023-06-24 DIAGNOSIS — Z79899 Other long term (current) drug therapy: Secondary | ICD-10-CM | POA: Diagnosis not present

## 2023-06-24 DIAGNOSIS — G4733 Obstructive sleep apnea (adult) (pediatric): Secondary | ICD-10-CM | POA: Insufficient documentation

## 2023-06-24 DIAGNOSIS — F17210 Nicotine dependence, cigarettes, uncomplicated: Secondary | ICD-10-CM | POA: Insufficient documentation

## 2023-06-24 DIAGNOSIS — I444 Left anterior fascicular block: Secondary | ICD-10-CM | POA: Diagnosis not present

## 2023-06-24 DIAGNOSIS — F17290 Nicotine dependence, other tobacco product, uncomplicated: Secondary | ICD-10-CM | POA: Diagnosis not present

## 2023-06-24 DIAGNOSIS — Z720 Tobacco use: Secondary | ICD-10-CM

## 2023-06-24 DIAGNOSIS — I5022 Chronic systolic (congestive) heart failure: Secondary | ICD-10-CM | POA: Insufficient documentation

## 2023-06-24 DIAGNOSIS — Z7984 Long term (current) use of oral hypoglycemic drugs: Secondary | ICD-10-CM | POA: Insufficient documentation

## 2023-06-24 DIAGNOSIS — R0602 Shortness of breath: Secondary | ICD-10-CM | POA: Diagnosis not present

## 2023-06-24 DIAGNOSIS — I5023 Acute on chronic systolic (congestive) heart failure: Secondary | ICD-10-CM

## 2023-06-24 LAB — BASIC METABOLIC PANEL
Anion gap: 12 (ref 5–15)
BUN: 17 mg/dL (ref 6–20)
CO2: 24 mmol/L (ref 22–32)
Calcium: 9.5 mg/dL (ref 8.9–10.3)
Chloride: 103 mmol/L (ref 98–111)
Creatinine, Ser: 1.03 mg/dL (ref 0.61–1.24)
GFR, Estimated: >60 mL/min (ref >60)
Glucose, Bld: 106 mg/dL — ABNORMAL HIGH (ref 70–99)
Potassium: 4.6 mmol/L (ref 3.5–5.1)
Sodium: 139 mmol/L (ref 135–145)

## 2023-06-24 LAB — BRAIN NATRIURETIC PEPTIDE: B Natriuretic Peptide: 374.4 pg/mL — ABNORMAL HIGH (ref 0.0–100.0)

## 2023-06-24 MED ORDER — TORSEMIDE 40 MG PO TABS
40.0000 mg | ORAL_TABLET | Freq: Two times a day (BID) | ORAL | 3 refills | Status: DC
  Filled 2023-06-24: qty 180, 90d supply, fill #0

## 2023-06-24 MED ORDER — METOLAZONE 2.5 MG PO TABS
2.5000 mg | ORAL_TABLET | ORAL | 1 refills | Status: DC | PRN
  Filled 2023-06-24: qty 8, 8d supply, fill #0

## 2023-06-24 MED ORDER — TORSEMIDE 20 MG PO TABS
40.0000 mg | ORAL_TABLET | Freq: Every day | ORAL | 11 refills | Status: DC
  Filled 2023-06-24: qty 60, 30d supply, fill #0

## 2023-06-24 MED ORDER — WEGOVY 0.5 MG/0.5ML ~~LOC~~ SOAJ
0.5000 mg | SUBCUTANEOUS | 0 refills | Status: DC
  Filled 2023-06-24: qty 2, 28d supply, fill #0

## 2023-06-24 MED ORDER — METOPROLOL SUCCINATE ER 25 MG PO TB24
12.5000 mg | ORAL_TABLET | Freq: Every day | ORAL | 3 refills | Status: DC
  Filled 2023-06-24 – 2023-07-13 (×2): qty 45, 90d supply, fill #0

## 2023-06-24 MED ORDER — POTASSIUM CHLORIDE CRYS ER 20 MEQ PO TBCR
40.0000 meq | EXTENDED_RELEASE_TABLET | Freq: Every day | ORAL | 3 refills | Status: DC
  Filled 2023-06-24 – 2023-07-13 (×2): qty 60, 30d supply, fill #0

## 2023-06-24 NOTE — Telephone Encounter (Signed)
Patient called to request add on appt  Reports he has not been feeling well all week with increase in exertional SOB and dizziness  Add on available 1/30 @ 1030

## 2023-06-24 NOTE — Addendum Note (Signed)
Encounter addended by: Levonne Spiller, RN on: 06/24/2023 11:44 AM  Actions taken: Order Reconciliation Section accessed, Medication long-term status modified, Visit diagnoses modified, Order list changed, Diagnosis association updated

## 2023-06-24 NOTE — Patient Instructions (Addendum)
Start Toprol XL 12.5 mg daily. Rx sent. Take Torsemide 40 mg twice daily - Rx sent. DO NOT START UNTIL INSTRUCTED BY [place] TO DO SO. Take potassium 40 meq daily - Rx sent. START TODAY. Take Metolazone 2.5 mg tablet as needed for weight gain, increased shortness or breath, or swelling. Take extra potassium 40 meq on days you take metolazone. Take one dose of Furoscix 80 mg today. Take an extra 40 meq of potassium today. We will order extra doses of Furoscix to be delivered to your home, do not use unless instructed by [place]. Labs today - will call you if abnormal. Return to Heart Failure APP Clinic tomorrow for re-assessment. See below.   Your provider has order Furoscix for you. This is an on-body infuser that gives you a dose of Furosemide.   It will be shipped to your home   [facility]uro[facility] will call you to discuss before shipping so, PLEASE answer unknown calls  For questions regarding the device call Furoscix Direct at [phone]  Ensure you write down the time you start your infusion so that if there is a problem you will know how long the infusion lasted  Use Furoscix only AS DIRECTED by our office  Dosing Directions:   Day 1 = 80 mg today

## 2023-06-24 NOTE — Telephone Encounter (Signed)
Advanced Heart Failure Patient Advocate Encounter  Prior authorization for Marcelline Deist has been submitted and approved. Test billing returns $4 for 90 day supply.  Key: [id number] Effective: 06/24/2023 to 06/23/2024  Burnell Blanks, CPhT Rx Patient Advocate Phone: [phone]

## 2023-06-24 NOTE — Progress Notes (Signed)
ADVANCED HF CLINIC NOTE  Primary Care: Patient, [name] Primary Cardiologist: Olga Millers, MD HF Cardiologist: Dr. Gala Romney  Chief Complaint: Heart Failure / Shortness of breath/Dizziness   HPI: Donald Murray is a morbidly obese 38 y.o. male with HTN, previous IVDA (heroin), tobacco use and systolic HF (onset 9/21).  Admitted to [facility] 9/21 with new onset HF in setting of severe HTN 174/128. ECG with sinus tach and frequent PVCs. Echo EF 20-25% Moderate RV dysfunction.   HF Consultation for the first time 04/24/20. 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 Zio placed to quantify PVC burden - 4.3%.  Admitted 12/22 with ADHF. Echo LVEF <20% w/ global HK, no visible thrombus, RV okay, mild MR.  Cath 06/12/21 with normal cors. EF < 10% Elevated filling pressures (PCWP 28) and low output (CI 1.5). Lasix increased to 80 bid.  Sleep study 02/23: Severe OSA, moderate central sleep apnea. Waiting for CPAP machine, reports he was told there is a backorder on supplies.  Seen by Dr. Graciela Husbands in 7/23 and planning ICD. But hasn't been done due to scheduling issues.   Today he returns for an acute HF visit due to increased shortness of breath. Started feeling bad on Sunday. Started getting SOB with exertion. + Orthopnea. Still waiting on CPAP. Denies fever or chills. Drinking lost of fluid including Gatorade. Appetite ok. Had fast food yesterday. No fever or chills. He does not weigh at home. Marland Kitchen He has missed a few doses of torsemide. He is waiting on Farxiga approval. Previously on Toprol XL but says he has not taken that since the fall. He doesn't have torsemide on his list but says he has some at home. Smoking 1/2 PPD.  He has been off Wegovy for 14 days but just got approved. [name].    Cardiac Studies: - Echo 2/24: EF 25-30%. RV moderately HK -Echo 09/24/21: EF 20-25% RV moderately HK -Echo 10/20 EF 60-65%   - CPX test 07/18/21 FVC 5.12  (92%)      FEV1 4.34  (96%)         FEV1/FVC 85   (103%)        MVV 164  (93%)   Resting HR: 126 Standing HR:125 Peak HR: 175   (95% age predicted max HR)  BP rest (standing): 92/70 BP peak: 148/72  Peak VO2: 21.6 (87% predicted peak VO2)  When adjusted to the patient's ideal body weight of 180.3 lb (81.8 kg) the peak VO2 is 41.6 ml/kg (ibw)/min (99% of the ibw-adjusted predicted).  VE/VCO2 slope:  45  VE/MVV:  92%  O2pulse:  19 (90% predicted O2pulse)   - RHC 1/23 Ao = 91/72 (81) LV = 108/37 RA = 9 RV = 51/15 PA = 66/38 (50) PCW = 28 Fick cardiac output/index = 3.9/1.5 PVR = 5.7 SVR = 1,484 Ao sat = 98% PA sat = 55%, 54% PAPI = 3.1  - Zio 12/21 Sinus - average HR 104 4.3% PVCs  Past Medical History:  Diagnosis Date   CHF (congestive heart failure) (HCC)    Hypertension    Obesity    Snoring    Current Outpatient Medications  Medication Sig Dispense Refill   dapagliflozin propanediol (FARXIGA) 10 MG TABS tablet Take 1 tablet (10 mg) by mouth daily before breakfast. 30 tablet 11   digoxin (LANOXIN) 0.125 MG tablet Take 1 tablet (0.125 mg total) by mouth daily. 90 tablet 3   ivabradine (CORLANOR) 7.5 MG TABS tablet  Take 1 tablet by mouth 2 times daily with a meal. 60 tablet 11   sacubitril-valsartan (ENTRESTO) 97-103 MG Take 1 tablet by mouth 2 (two) times daily. 60 tablet 6   spironolactone (ALDACTONE) 25 MG tablet Take 1 tablet (25 mg total) by mouth daily. 90 tablet 3   torsemide (DEMADEX) 20 MG tablet Take 40 mg by mouth 2 (two) times daily.     Semaglutide-Weight Management (WEGOVY) 0.5 MG/0.5ML SOAJ Inject 0.5 mg into the skin once a week. (Patient not taking: Reported on 06/24/2023) 2 mL 0   No current facility-administered medications for this encounter.   No Known Allergies  Social History   Socioeconomic History   Marital status: Single    Spouse name: Not on file   Number of children: Not on file   Years of education: Not on file   Highest education level: Not on file  Occupational  History   Not on file  Tobacco Use   Smoking status: Every Day    Current packs/day: 1.00    Average packs/day: 1 pack/day for 16.0 years (16.0 ttl pk-yrs)    Types: Cigarettes   Smokeless tobacco: Never  Vaping Use   Vaping status: Some Days   Substances: Nicotine  Substance and Sexual Activity   Alcohol use: Not Currently    Comment: Pt stated "2 years clean"   Drug use: Not Currently    Comment: Pt stated "It was opiates"   Sexual activity: Not on file  Other Topics Concern   Not on file  Social History Narrative   Not on file   Social Drivers of Health   Financial Resource Strain: High Risk (04/08/2022)   Overall Financial Resource Strain (CARDIA)    Difficulty of Paying Living Expenses: Very hard  Food Insecurity: No Food Insecurity (01/17/2021)   Hunger Vital Sign    Worried About Running Out of Food in the Last Year: Never true    Ran Out of Food in the Last Year: Never true  Transportation Needs: No Transportation Needs (01/17/2021)   PRAPARE - Administrator, Civil Service (Medical): No    Lack of Transportation (Non-Medical): No  Physical Activity: Not on file  Stress: Not on file  Social Connections: Not on file  Intimate Partner Violence: Not on file   Family History  Problem Relation Age of Onset   Hypertension Mother    Hypertension Father     BP (!) 140/110   Pulse (!) 136   Wt (!) 176.4 kg (389 lb)   SpO2 95%   BMI 54.25 kg/m   Wt Readings from Last 3 Encounters:  06/24/23 (!) 176.4 kg (389 lb)  04/19/23 (!) 175.5 kg (386 lb 12.8 oz)  03/17/23 (!) 180.4 kg (397 lb 9.6 oz)    PHYSICAL EXAM: General:   No resp difficulty HEENT: normal Neck: supple. JVP 11-12 . Carotids 2+ bilat; no bruits. No lymphadenopathy or thryomegaly appreciated. Cor: PMI nondisplaced. Tachy Regular rate & rhythm. No rubs, gallops or murmurs. Lungs: clear Abdomen: obese soft, nontender, nondistended. No hepatosplenomegaly. No bruits or masses. Good bowel  sounds. Extremities: no cyanosis, clubbing, rash, edema Neuro: alert & orientedx3, cranial nerves grossly intact. moves all 4 extremities w/o difficulty. Affect pleasant  EKG: ST 126 bpm   ASSESSMENT & PLAN:  1. Chronic Systolic HF - diagnosed 9/21 Echo EF 20-25% RV moderately HK - suspect HTN vs PVC-mediated (PVC burden 4.3% - probably not high enough to cause CM) - cath 1/23  no CAD EF < 10% - CPX 2/23 pVO2: 21.6 (87% predicted peak VO2) corrected for ibw pVO2 41.6 ml/kg (ibw)/min (99% of the ibw-adjusted predicted). Slope:45 O2pulse:  19 (90% pred) - Echo 09/24/21: EF 20-25% RV moderately HK - Echo 2/24: EF 25-30%. RV moderately HK - CPX test reassuring VO2 but slope high  - NYHA III. Volume overloaded. Suspect in the setting of high sodium diet and fluid intake. He has also missed a few doses of Torsemide. Discussed low sodium diet and trying to avoid fast food. Also avoid Gatorade.   - Given Furoscix today and will order #3.  FUROSCIX prescribed  Patient viewed patient education video with QR code for Lenor Coffin code for FUROSCIX placed on AVS  Call FUROSCIX Direct at [phone] for questions regarding on body infuser. Day 1  FUROSCIX 80 mg once daily  via on body infuser + KDUR  40 meq twice a day Day 2  Restart Torsemide 40 mg twice a day + 40 meq K. He was prescribed both.   - I have also prescribed 2.5 mg metolazone as needed.  - Continue Farxiga 10 mg daily  - Continue entresto 97-103 mg tiwce a day  - Continue digoxin 0.125 mg daily. - Continue spiro 25 mg daily. - Restart 12.5 mg Toprol XL daily.  - Continue Ivabradine 7.5 mg bid. - Continue to follow for need for advanced therapies. We discussed the potential for VAD again but weight is getting quite high.  We checked Hgba1c but only 5.9 so hard to get GLP1RA currently - Check BMET and BNP  - Set up ECHO in a few months.   2. HTN -Elevated. Adding Toprol 12.5 mg daily  - Continue entresto 97-103 mg twice a day    3. Sinus Tach  Continue Ivabradine. Adding back Toprol Xl 12.5 mg daily. Continue digoxin 0.125 mg daily.   4.  H/O Frequent PVCs - Zio 12/21 4.3% PVCs - probably not high enough burden to cause CM  - Has severe OSA.   5.  Morbid obesity - Body mass index is 54.25 kg/m. - Starting AOZHYQ   6. OSA, Severe - Not on CPAP yet. Cannot afford.  6.  Tobacco use - Smoking 1/2 PPD. Discussed smoking cessation   7. H/O IVDA (heroin) -Last used 2020   Asked him to bring all medications tomorrow.  Follow up tomorrow to reassess volume if no improvement will need hospital admit.   Adelaine Roppolo NP-C 10:57 AM

## 2023-06-24 NOTE — Addendum Note (Signed)
Addended by: Cheree Ditto on: 06/24/2023 10:01 AM   Modules accepted: Orders

## 2023-06-24 NOTE — Progress Notes (Signed)
Your provider has order Furoscix for you. This is an on-body infuser that gives you a dose of Furosemide.   It will be shipped to your home   [facility]urosc[facility] will call you to discuss before shipping so, PLEASE answer unknown calls  For questions regarding the device call Furoscix [facility] at [phone]  Ensure you write down the time you start your infusion so that if there is a problem you will know how long the infusion lasted  Use Furoscix only AS DIRECTED by our office  Dosing Directions:   Day 1= 80 mg today   Provided patient education on Furoscix using demo kits and Furoscix video, QR code provided on AVS for further viewing. Furoscix order form completed and signed by Tonye Becket, NP. Order form, ins info, & OV note all faxed into [facility]uroscix [facility].

## 2023-06-25 ENCOUNTER — Ambulatory Visit (HOSPITAL_COMMUNITY)
Admission: RE | Admit: 2023-06-25 | Discharge: 2023-06-25 | Disposition: A | Discharge status: Home / Self Care | Payer: BC Managed Care – PPO | Source: Ambulatory Visit | Attending: Adult Health | Admitting: Adult Health

## 2023-06-25 ENCOUNTER — Encounter (HOSPITAL_COMMUNITY): Payer: Self-pay

## 2023-06-25 VITALS — BP 122/90 | HR 101 | Ht 71.0 in | Wt 383.4 lb

## 2023-06-25 DIAGNOSIS — Z720 Tobacco use: Secondary | ICD-10-CM

## 2023-06-25 DIAGNOSIS — F17210 Nicotine dependence, cigarettes, uncomplicated: Secondary | ICD-10-CM | POA: Insufficient documentation

## 2023-06-25 DIAGNOSIS — Z6841 Body Mass Index (BMI) 40.0 and over, adult: Secondary | ICD-10-CM | POA: Insufficient documentation

## 2023-06-25 DIAGNOSIS — Z79899 Other long term (current) drug therapy: Secondary | ICD-10-CM | POA: Insufficient documentation

## 2023-06-25 DIAGNOSIS — I5023 Acute on chronic systolic (congestive) heart failure: Secondary | ICD-10-CM

## 2023-06-25 DIAGNOSIS — Z7984 Long term (current) use of oral hypoglycemic drugs: Secondary | ICD-10-CM | POA: Insufficient documentation

## 2023-06-25 DIAGNOSIS — G4733 Obstructive sleep apnea (adult) (pediatric): Secondary | ICD-10-CM | POA: Insufficient documentation

## 2023-06-25 DIAGNOSIS — R000 Tachycardia, unspecified: Secondary | ICD-10-CM | POA: Diagnosis not present

## 2023-06-25 DIAGNOSIS — I493 Ventricular premature depolarization: Secondary | ICD-10-CM | POA: Insufficient documentation

## 2023-06-25 DIAGNOSIS — I110 Hypertensive heart disease with heart failure: Secondary | ICD-10-CM | POA: Diagnosis not present

## 2023-06-25 DIAGNOSIS — F17290 Nicotine dependence, other tobacco product, uncomplicated: Secondary | ICD-10-CM | POA: Diagnosis not present

## 2023-06-25 DIAGNOSIS — I10 Essential (primary) hypertension: Secondary | ICD-10-CM

## 2023-06-25 DIAGNOSIS — I5022 Chronic systolic (congestive) heart failure: Secondary | ICD-10-CM

## 2023-06-25 LAB — BASIC METABOLIC PANEL
Anion gap: 12 (ref 5–15)
BUN: 17 mg/dL (ref 6–20)
CO2: 26 mmol/L (ref 22–32)
Calcium: 9.3 mg/dL (ref 8.9–10.3)
Chloride: 101 mmol/L (ref 98–111)
Creatinine, Ser: 1.08 mg/dL (ref 0.61–1.24)
GFR, Estimated: >60 mL/min (ref >60)
Glucose, Bld: 98 mg/dL (ref 70–99)
Potassium: 4.4 mmol/L (ref 3.5–5.1)
Sodium: 139 mmol/L (ref 135–145)

## 2023-06-25 LAB — BRAIN NATRIURETIC PEPTIDE: B Natriuretic Peptide: 281.4 pg/mL — ABNORMAL HIGH (ref 0.0–100.0)

## 2023-06-25 MED ORDER — TORSEMIDE 20 MG PO TABS: 80.0000 mg | ORAL_TABLET | Freq: Every day | ORAL | 3 refills | Status: DC

## 2023-06-25 NOTE — Patient Instructions (Signed)
Medication Changes:  TAKE TORSEMIDE 80MG  ONCE DAILY   PLEASE CALL FUROSCIX REGARDING DELIVERY OF YOUR MEDICATION---Please call FUROSCIX Direct at [phone] ([phone])  Lab Work:  Labs done today, your results will be available in MyChart, we will contact you for abnormal readings.  Follow-Up in: 3 WEEKS AS SCHEDULED WITH PHARMACY   THEN AGAIN IN 6 WEEKS WITH APP AS SCHEDULED   At the [facility], you and your health needs are our priority. We have a designated team specialized in the treatment of Heart Failure. This Care Team includes your primary Heart Failure Specialized Cardiologist (physician), Advanced Practice Providers (APPs- Physician Assistants and Nurse Practitioners), and Pharmacist who all work together to provide you with the care you need, when you need it.   You may see any of the following providers on your designated Care Team at your next follow up:  Dr. Arvilla Meres Dr. Marca Ancona Dr. Dorthula Nettles Dr. Theresia Bough Tonye Becket, NP Robbie Lis, [place] [facility] [place], [place] Brynda Peon, NP [place] Lee, NP Karle Plumber, PharmD   Please be sure to bring in all your medications bottles to every appointment.   Need to Contact us:  If you have any questions or concerns before your next appointment please send [place] a message through [place] or call our office at [phone].    TO LEAVE A MESSAGE FOR THE NURSE SELECT OPTION 2, PLEASE LEAVE A MESSAGE INCLUDING: YOUR NAME DATE OF BIRTH CALL BACK NUMBER REASON FOR CALL**this is important as we prioritize the call backs  YOU WILL RECEIVE A CALL BACK THE SAME DAY AS LONG AS YOU CALL BEFORE 4:00 PM

## 2023-06-25 NOTE — Progress Notes (Signed)
ADVANCED HF CLINIC NOTE  Primary Care: Patient, [name] Primary Cardiologist: Olga Millers, MD HF Cardiologist: Dr. Gala Romney  Chief Complaint: Heart [name]  HPI: Donald Murray is a morbidly obese 38 y.o. male with HTN, previous IVDA (heroin), tobacco use and systolic HF (onset 9/21).  Admitted to [facility] 9/21 with new onset HF in setting of severe HTN 174/128. ECG with sinus tach and frequent PVCs. Echo EF 20-25% Moderate RV dysfunction.   HF Consultation for the first time 04/24/20. 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 Zio placed to quantify PVC burden - 4.3%.  Admitted 12/22 with ADHF. Echo LVEF <20% w/ global HK, no visible thrombus, RV okay, mild MR.  Cath 06/12/21 with normal cors. EF < 10% Elevated filling pressures (PCWP 28) and low output (CI 1.5). Lasix increased to 80 bid.  Sleep study 02/23: Severe OSA, moderate central sleep apnea. Waiting for CPAP machine, reports he was told there is a backorder on supplies.  Seen by Dr. Graciela Husbands in 7/23 and planning ICD. But hasn't been done due to scheduling issues.   06/24/23. Seen in HF clinic. Volume overloaded.  Given 80 mg Furoscix. He wore on body infuser 4 hours then he accidentally pulled off. He reports lots of urine output.   Today he returns for HF follow up.Overall feeling better. Able to walk back to the clinic. Had just a little shortness of breath.  Denies PND/Orthopnea. Slept better last night.  Chronically sleeps on 2 pillows. Appetite ok. No fever or chills. He does not weight at home. Continues to smoke. Taking all medications.  Cardiac Studies: - Echo 2/24: EF 25-30%. RV moderately HK -Echo 09/24/21: EF 20-25% RV moderately HK -Echo 10/20 EF 60-65%   - CPX test 07/18/21 FVC 5.12  (92%)      FEV1 4.34  (96%)        FEV1/FVC 85   (103%)        MVV 164  (93%)   Resting HR: 126 Standing HR:125 Peak HR: 175   (95% age predicted max HR)  BP rest (standing): 92/70 BP peak: 148/72  Peak VO2: 21.6 (87% predicted peak  VO2)  When adjusted to the patient's ideal body weight of 180.3 lb (81.8 kg) the peak VO2 is 41.6 ml/kg (ibw)/min (99% of the ibw-adjusted predicted).  VE/VCO2 slope:  45  VE/MVV:  92%  O2pulse:  19 (90% predicted O2pulse)   - RHC 1/23 Ao = 91/72 (81) LV = 108/37 RA = 9 RV = 51/15 PA = 66/38 (50) PCW = 28 Fick cardiac output/index = 3.9/1.5 PVR = 5.7 SVR = 1,484 Ao sat = 98% PA sat = 55%, 54% PAPI = 3.1  - Zio 12/21 Sinus - average HR 104 4.3% PVCs  Past Medical History:  Diagnosis Date   CHF (congestive heart failure) (HCC)    Hypertension    Obesity    Snoring    Current Outpatient Medications  Medication Sig Dispense Refill   dapagliflozin propanediol (FARXIGA) 10 MG TABS tablet Take 1 tablet (10 mg) by mouth daily before breakfast. 30 tablet 11   digoxin (LANOXIN) 0.125 MG tablet Take 1 tablet (0.125 mg total) by mouth daily. 90 tablet 3   ivabradine (CORLANOR) 7.5 MG TABS tablet Take 1 tablet by mouth 2 times daily with a meal. 60 tablet 11   metolazone (ZAROXOLYN) 2.5 MG tablet Take 1 tablet (2.5 mg total) by mouth as needed. 8 tablet 1   metoprolol succinate (TOPROL XL) 25 MG 24 hr  tablet Take 0.5 tablets (12.5 mg total) by mouth daily. 45 tablet 3   potassium chloride SA (KLOR-CON M) 20 MEQ tablet Take 2 tablets (40 mEq total) by mouth daily. Take an extra 2 tablets (40 meq) on days you take metolazone.  **INS MAX 2 TABLETS/DAY** 200 tablet 3   sacubitril-valsartan (ENTRESTO) 97-103 MG Take 1 tablet by mouth 2 (two) times daily. 60 tablet 6   Semaglutide-Weight Management (WEGOVY) 0.5 MG/0.5ML SOAJ Inject 0.5 mg into the skin once a week. 2 mL 0   spironolactone (ALDACTONE) 25 MG tablet Take 1 tablet (25 mg total) by mouth daily. 90 tablet 3   torsemide (DEMADEX) 20 MG tablet Take 2 tablets (40 mg total) by mouth daily. 60 tablet 11   No current facility-administered medications for this encounter.   No Known Allergies  Social History   Socioeconomic History    Marital status: Single    Spouse name: Not on file   Number of children: Not on file   Years of education: Not on file   Highest education level: Not on file  Occupational History   Not on file  Tobacco Use   Smoking status: Every Day    Current packs/day: 1.00    Average packs/day: 1 pack/day for 16.0 years (16.0 ttl pk-yrs)    Types: Cigarettes   Smokeless tobacco: Never  Vaping Use   Vaping status: Some Days   Substances: Nicotine  Substance and Sexual Activity   Alcohol use: Not Currently    Comment: Pt stated "2 years clean"   Drug use: Not Currently    Comment: Pt stated "It was opiates"   Sexual activity: Not on file  Other Topics Concern   Not on file  Social History Narrative   Not on file   Social Drivers of Health   Financial Resource Strain: High Risk (04/08/2022)   Overall Financial Resource Strain (CARDIA)    Difficulty of Paying Living Expenses: Very hard  Food Insecurity: No Food Insecurity (01/17/2021)   Hunger Vital Sign    Worried About Running Out of Food in the Last Year: Never true    Ran Out of Food in the Last Year: Never true  Transportation Needs: No Transportation Needs (01/17/2021)   PRAPARE - Administrator, Civil Service (Medical): No    Lack of Transportation (Non-Medical): No  Physical Activity: Not on file  Stress: Not on file  Social Connections: Not on file  Intimate Partner Violence: Not on file   Family History  Problem Relation Age of Onset   Hypertension Mother    Hypertension Father     BP (!) 122/90   Pulse (!) 101   Ht 5\' 11"  (1.803 m)   Wt (!) 173.9 kg (383 lb 6.4 oz)   SpO2 96%   BMI 53.47 kg/m   Wt Readings from Last 3 Encounters:  06/25/23 (!) 173.9 kg (383 lb 6.4 oz)  06/24/23 (!) 176.4 kg (389 lb)  04/19/23 (!) 175.5 kg (386 lb 12.8 oz)    PHYSICAL EXAM: General:  Walked in the clinic.  No resp difficulty HEENT: normal Neck: supple. JVP 6-7 . Carotids 2+ bilat; no bruits.  Cor: PMI  nondisplaced. Regular rate & rhythm. No rubs, gallops or murmurs. Lungs: clear Abdomen: soft, nontender, nondistended.  Extremities: no cyanosis, clubbing, rash, edema Neuro: alert & oriented x3  ASSESSMENT & PLAN:  1. Chronic Systolic HF - diagnosed 9/21 Echo EF 20-25% RV moderately HK - suspect HTN vs  PVC-mediated (PVC burden 4.3% - probably not high enough to cause CM) - cath 1/23 no CAD EF < 10% - CPX 2/23 pVO2: 21.6 (87% predicted peak VO2) corrected for ibw pVO2 41.6 ml/kg (ibw)/min (99% of the ibw-adjusted predicted). Slope:45 O2pulse:  19 (90% pred) - Echo 09/24/21: EF 20-25% RV moderately HK - Echo 2/24: EF 25-30%. RV moderately HK - CPX test reassuring VO2 but slope high  - NYHA III. Volume status improved. Will start torsemide 80 mg daily and give 40 meq potassium daily. Continue 2.5 mg metolazone as needed.  - Continue Farxiga 10 mg daily  - Continue entresto 97-103 mg tiwce a day  - Continue digoxin 0.125 mg daily. - Continue spiro 25 mg daily. -Continue 12.5 mg Toprol XL daily.  - Continue Ivabradine 7.5 mg bid. - Continue to follow for need for advanced therapies. We discussed the potential for VAD again but weight is getting quite high.  Check BMET BNP today - Repeat ECHO in 6 weeks.  - Furoscix is being mailed to his house. Approved. Discussed Furoscix has a 2 year shelf life.   2. HTN Still running a little high. Will try and push meds next visit. - Continue Toprol 12.5 mg daily  - Continue entresto 97-103 mg twice a day  - Continue 12.5 mg spironolactone daily   3. Sinus Tach  -Rate better controlled today.  -Continue Ivabradine.  - Continue Toprol Xl 12.5 mg daily.  -Continue digoxin 0.125 mg daily.   4.  H/O Frequent PVCs - Zio 12/21 4.3% PVCs - probably not high enough burden to cause CM  - Has severe OSA.   5.  Morbid obesity - Body mass index is 53.47 kg/m. - Starting ZDGUYQ this week.  - Discussed portion control   6. OSA, Severe - Not on CPAP  yet. Cannot afford.  6.  Tobacco use - Smoking 1/2 PPD.  - Discussed smoking cessation  7. H/O IVDA (heroin) -Last used 2020   Follow up in 3 weeks with pharmacy and 6 weeks with APP and an ECHO.   Tajanay Hurley NP-C 9:44 AM

## 2023-06-28 ENCOUNTER — Other Ambulatory Visit (HOSPITAL_COMMUNITY): Payer: Self-pay

## 2023-06-29 ENCOUNTER — Other Ambulatory Visit (HOSPITAL_COMMUNITY): Payer: Self-pay

## 2023-06-29 NOTE — Progress Notes (Signed)
 Advanced Heart Failure Clinic Note   Primary Care: Patient, [name] Primary Cardiologist: Olga Millers, MD HF Cardiologist: Dr. Gala Romney  HPI:  Donald Murray is a morbidly obese 38 y.o. male with HTN, previous IVDA (heroin), tobacco use and systolic HF (onset 01/2020).   Admitted to [facility] 01/2020 with new onset HF in setting of severe HTN 174/128. ECG with sinus tach and frequent PVCs. Echo EF 20-25% Moderate RV dysfunction.    HF Consultation for the first time 04/24/20. 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 Zio placed to quantify PVC burden - 4.3%.   Admitted 04/2021 with ADHF. Echo LVEF <20% w/ global HK, no visible thrombus, RV okay, mild MR.   Cath 06/12/21 with normal cors. EF < 10% Elevated filling pressures (PCWP 28) and low output (CI 1.5). Lasix increased to 80 mg BID.   Sleep study 06/2021: Severe OSA, moderate central sleep apnea. Waiting for CPAP machine, reports he was told there is a backorder on supplies.   Seen by Dr. Graciela Husbands in 11/2021 and planning ICD. But hasn't been done due to scheduling issues.    06/24/23. Seen in HF clinic. Volume overloaded.  Given 80 mg Furoscix. He wore on body infuser 4 hours then he accidentally pulled off. He reported lots of urine output.    Returned to [facility] for HF follow up 06/25/23 with APP. Overall was feeling better. Was able to walk back to the clinic. Had just a little shortness of breath.  Denied PND/Orthopnea. Reported that he slept better last night.  Chronically sleeps on 2 pillows. Appetite was ok. No fever or chills. He reported that he does not weigh himself at home. Continued to smoke. Reported taking all medications.  Today he returns to HF clinic for pharmacist medication titration. At last visit with APP, torsemide 80 mg daily and KCL 40 mEq daily was initiated. Overall feeling ok today, has good days and bad days. Notes he occasionally misses medications. Missed last night's medications because he was out of town on a  work trip. Says he thinks he has missed two days of torsemide since his last visit on 06/25/23. He took all his medications today except the torsemide. Does note dizziness/lightheadedness, especially when getting up in the morning. Does not have a BP cuff to check his BP. BP in clinic was 114/78. Sometimes dizziness is related to coughing fits (he is a smoker and trying to quit). No CP or palpitations. SOB stable from last visit. He does not weigh himself at home as he lost his previous scale. His weight is up 9.4 lbs from last visit, which was the day after he received Furoscix. His weight is up 3.8 lbs compared to the visit where he was prescribed Furoscix. Of note, he did receive a shipment of Furoscix and has it at home. He also states that even though his weight is up higher than the visit where he needed Furoscix, his SOB is not as bad as before that visit. No LEE. Sleeps on 1-3 pillows. Notes breathing is worse at night. He does have sleep apnea but was previously unable to afford equipment. Interested in treatment for sleep apnea now that he has Medicaid. He does not follow a low salt diet and has not been restricting fluids because torsemide causes him to be thirsty.    HF Medications: Metoprolol succinate 12.5 mg daily Entresto 97/103 mg BID Spironolactone 25 mg daily Farxiga 10 mg daily Digoxin 0.125 mg daily Ivabradine 7.5 mg BID Torsemide  80 mg daily + metolazone 2.5 mg PRN KCL 40 mEq daily  Has the patient been experiencing any side effects to the medications prescribed?  no  Does the patient have any problems obtaining medications due to transportation or finances?   Now has BCBS comm and [place] Medicaid. Has most medications at [facility] but would like to fill at [facility] from now on since it is more convenient. Reached out to patient advocate to facilitate transfer. He no longer uses the [facility], removed from preferred pharmacy list per patient request.    Understanding of regimen: good Understanding of indications: good Potential of compliance: good Patient understands to avoid NSAIDs. Patient understands to avoid decongestants.    Pertinent Lab Values: 06/25/23: Serum creatinine 1.08, BUN 17, Potassium 4.5, Sodium 138, BNP 281.4  Vital Signs: Weight: 392.8 lbs (last clinic weight: 383.4 lbs) Blood pressure: 114/78  Heart rate: 97   Assessment/Plan: 1. Chronic Systolic HF - diagnosed 01/2020 Echo EF 20-25% RV moderately HK - suspect HTN vs PVC-mediated (PVC burden 4.3% - probably not high enough to cause CM) - cath 05/2021 no CAD EF < 10% - CPX 06/2021 pVO2: 21.6 (87% predicted peak VO2) corrected for ibw pVO2 41.6 ml/kg (ibw)/min (99% of the ibw-adjusted predicted). Slope:45 O2pulse:  19 (90% pred) - Echo 09/24/21: EF 20-25% RV moderately HK - Echo 06/2022: EF 25-30%. RV moderately HK - CPX test reassuring VO2 but slope high  - NYHA III. Volume status elevated, weight up 9.4 lbs from last visit. He has not taken torsemide yet today. BMET, BNP today pending.  - Take metolazone 2.5 mg today 30 minutes before torsemide and continue torsemide 80 mg daily and KCL 40 mEq daily. Instructed to take an extra tablet of KCL today with the metolazone. He will let us know if weight does not improve or if poor urine output with metolazone. He has Furoscix at home and this may be needed if he does not respond to the metolazone. Provided with scale today.  - Continue metoprolol succinate 12.5 mg daily.  - Continue Entresto 97-103 mg BID - Continue spironolactone 25 mg daily. - Continue Farxiga 10 mg daily  - Continue digoxin 0.125 mg daily. - Continue Ivabradine 7.5 mg BID. - Continue to follow for need for advanced therapies. Recently discussed the potential for VAD again but weight is getting quite high.  - Repeat ECHO in 6 weeks.  - Now has Furoscix at his house. Approved. Discussed Furoscix has a 2 year shelf life.    2. HTN  - BP controlled  today.  - Continue metoprolol succinate mg daily  - Continue Entresto 97-103 mg twice a day  - Continue spironolactone 25 mg daily    3. Sinus Tach  -Rate better controlled today.  -Continue Ivabradine 7.5 mg BID.  - Continue metoprolol succinate 12.5 mg daily.  -Continue digoxin 0.125 mg daily.    4.  H/O Frequent PVCs - Zio 04/2020 4.3% PVCs - probably not high enough burden to cause CM  - Has severe OSA.    5.  Morbid obesity - Body mass index is 53.47 kg/m. - Continue Wegovy. Increases to 1 mg dose next week.  - Discussed portion control    6. OSA, Severe - Not on CPAP yet. Could not afford previously. Now has Medicaid and interested in treatment. Provided him with number to Dr. Norris Cross office who saw him previously for CPAP to see if another sleep study is needed.  6.  Tobacco use - Smoking 1/2 PPD.  - Discussed smoking cessation   7. H/O IVDA (heroin) -Last used 2020   Follow up 3 weeks with APP Clinic.    Karle Plumber, PharmD, BCPS, BCCP, CPP Heart Failure Clinic Pharmacist [phone]

## 2023-07-01 ENCOUNTER — Other Ambulatory Visit (HOSPITAL_COMMUNITY): Payer: Self-pay

## 2023-07-05 ENCOUNTER — Other Ambulatory Visit (HOSPITAL_COMMUNITY): Payer: Self-pay

## 2023-07-05 NOTE — Telephone Encounter (Signed)
 Pharmacy Patient Advocate Encounter  Received notification from [employer] [employer] that Prior Authorization for wegovy  has been APPROVED from 06/14/23 to 07/14/23 Representative called and said approved for one month to monitor patients progress- Resubmit another PA on 02/15 with updated weight in chart note  PA #/Case ID/Reference #: [id number]

## 2023-07-12 ENCOUNTER — Other Ambulatory Visit (HOSPITAL_COMMUNITY): Payer: Self-pay

## 2023-07-12 MED ORDER — WEGOVY 1 MG/0.5ML ~~LOC~~ SOAJ
1.0000 mg | SUBCUTANEOUS | 0 refills | Status: DC
  Filled 2023-07-12 – 2023-07-13 (×2): qty 2, 28d supply, fill #0

## 2023-07-12 NOTE — Addendum Note (Signed)
 Addended by: Cheree Ditto on: 07/12/2023 07:59 AM   Modules accepted: Orders

## 2023-07-13 ENCOUNTER — Other Ambulatory Visit: Payer: Self-pay

## 2023-07-13 ENCOUNTER — Other Ambulatory Visit (HOSPITAL_COMMUNITY): Payer: Self-pay

## 2023-07-13 ENCOUNTER — Ambulatory Visit (HOSPITAL_COMMUNITY)
Admission: RE | Admit: 2023-07-13 | Discharge: 2023-07-13 | Disposition: A | Discharge status: Home / Self Care | Payer: BC Managed Care – PPO | Source: Ambulatory Visit | Attending: Cardiology

## 2023-07-13 VITALS — BP 114/78 | HR 97 | Wt 392.8 lb

## 2023-07-13 DIAGNOSIS — F17210 Nicotine dependence, cigarettes, uncomplicated: Secondary | ICD-10-CM | POA: Diagnosis not present

## 2023-07-13 DIAGNOSIS — G4733 Obstructive sleep apnea (adult) (pediatric): Secondary | ICD-10-CM | POA: Insufficient documentation

## 2023-07-13 DIAGNOSIS — Z6841 Body Mass Index (BMI) 40.0 and over, adult: Secondary | ICD-10-CM | POA: Insufficient documentation

## 2023-07-13 DIAGNOSIS — I5023 Acute on chronic systolic (congestive) heart failure: Secondary | ICD-10-CM | POA: Diagnosis not present

## 2023-07-13 DIAGNOSIS — I110 Hypertensive heart disease with heart failure: Secondary | ICD-10-CM | POA: Diagnosis not present

## 2023-07-13 DIAGNOSIS — I5022 Chronic systolic (congestive) heart failure: Secondary | ICD-10-CM | POA: Insufficient documentation

## 2023-07-13 LAB — BASIC METABOLIC PANEL
Anion gap: 10 (ref 5–15)
BUN: 11 mg/dL (ref 6–20)
CO2: 25 mmol/L (ref 22–32)
Calcium: 8.9 mg/dL (ref 8.9–10.3)
Chloride: 103 mmol/L (ref 98–111)
Creatinine, Ser: 1.09 mg/dL (ref 0.61–1.24)
GFR, Estimated: >60 mL/min (ref >60)
Glucose, Bld: 94 mg/dL (ref 70–99)
Potassium: 4.5 mmol/L (ref 3.5–5.1)
Sodium: 138 mmol/L (ref 135–145)

## 2023-07-13 LAB — BRAIN NATRIURETIC PEPTIDE: B Natriuretic Peptide: 241.1 pg/mL — ABNORMAL HIGH (ref 0.0–100.0)

## 2023-07-13 MED ORDER — TORSEMIDE 20 MG PO TABS
80.0000 mg | ORAL_TABLET | Freq: Every day | ORAL | 5 refills | Status: DC
  Filled 2023-07-13: qty 120, 30d supply, fill #0

## 2023-07-13 MED ORDER — METOLAZONE 2.5 MG PO TABS
2.5000 mg | ORAL_TABLET | Freq: Every day | ORAL | 1 refills | Status: DC | PRN
  Filled 2023-07-13: qty 4, 4d supply, fill #0

## 2023-07-13 NOTE — Patient Instructions (Signed)
 It was a pleasure seeing you today!  MEDICATIONS: -Take metolazone 2.5 mg for one dose today 30 minutes before your torsemide. -Please take an extra potassium tablet today to equal 60 mEq (3 tablets) total today only.  -Call if you have questions about your medications.  LABS: -We will call you if your labs need attention.  NEXT APPOINTMENT: Return to clinic in 3 weeks with APP Clinic.  In general, to take care of your heart failure: -Limit your fluid intake to 2 Liters (half-gallon) per day.   -Limit your salt intake to ideally 2-3 grams (2000-3000 mg) per day. -Weigh yourself daily and record, and bring that "weight diary" to your next appointment.  (Weight gain of 2-3 pounds in 1 day typically means fluid weight.) -The medications for your heart are to help your heart and help you live longer.   -Please contact us before stopping any of your heart medications.  Call the clinic at [phone] with questions or to reschedule future appointments.

## 2023-07-19 ENCOUNTER — Encounter (HOSPITAL_COMMUNITY): Payer: BC Managed Care – PPO

## 2023-07-20 ENCOUNTER — Other Ambulatory Visit (HOSPITAL_COMMUNITY): Payer: Self-pay

## 2023-08-05 ENCOUNTER — Telehealth (HOSPITAL_COMMUNITY): Payer: Self-pay

## 2023-08-05 NOTE — Telephone Encounter (Signed)
 Called and was unable to leave patient a voice message to confirm/remind patient of their appointment at the Advanced Heart Failure Clinic on 08/06/23.

## 2023-08-06 ENCOUNTER — Encounter (HOSPITAL_COMMUNITY): Payer: Self-pay

## 2023-08-06 ENCOUNTER — Telehealth (HOSPITAL_COMMUNITY): Payer: Self-pay

## 2023-08-06 ENCOUNTER — Other Ambulatory Visit (HOSPITAL_COMMUNITY): Payer: Self-pay

## 2023-08-06 ENCOUNTER — Ambulatory Visit (HOSPITAL_COMMUNITY)
Admission: RE | Admit: 2023-08-06 | Discharge: 2023-08-06 | Disposition: A | Discharge status: Home / Self Care | Payer: MEDICAID | Source: Ambulatory Visit | Attending: Family Medicine | Admitting: Family Medicine

## 2023-08-06 VITALS — BP 122/78 | HR 94 | Ht 71.0 in | Wt 384.4 lb

## 2023-08-06 DIAGNOSIS — I10 Essential (primary) hypertension: Secondary | ICD-10-CM

## 2023-08-06 DIAGNOSIS — G4733 Obstructive sleep apnea (adult) (pediatric): Secondary | ICD-10-CM | POA: Insufficient documentation

## 2023-08-06 DIAGNOSIS — R0602 Shortness of breath: Secondary | ICD-10-CM | POA: Diagnosis present

## 2023-08-06 DIAGNOSIS — F17210 Nicotine dependence, cigarettes, uncomplicated: Secondary | ICD-10-CM | POA: Diagnosis not present

## 2023-08-06 DIAGNOSIS — I110 Hypertensive heart disease with heart failure: Secondary | ICD-10-CM | POA: Insufficient documentation

## 2023-08-06 DIAGNOSIS — Z720 Tobacco use: Secondary | ICD-10-CM

## 2023-08-06 DIAGNOSIS — I493 Ventricular premature depolarization: Secondary | ICD-10-CM

## 2023-08-06 DIAGNOSIS — Z6841 Body Mass Index (BMI) 40.0 and over, adult: Secondary | ICD-10-CM | POA: Diagnosis not present

## 2023-08-06 DIAGNOSIS — I5022 Chronic systolic (congestive) heart failure: Secondary | ICD-10-CM | POA: Insufficient documentation

## 2023-08-06 DIAGNOSIS — Z716 Tobacco abuse counseling: Secondary | ICD-10-CM | POA: Diagnosis not present

## 2023-08-06 DIAGNOSIS — F1990 Other psychoactive substance use, unspecified, uncomplicated: Secondary | ICD-10-CM

## 2023-08-06 DIAGNOSIS — Z7984 Long term (current) use of oral hypoglycemic drugs: Secondary | ICD-10-CM | POA: Diagnosis not present

## 2023-08-06 DIAGNOSIS — Z79899 Other long term (current) drug therapy: Secondary | ICD-10-CM | POA: Insufficient documentation

## 2023-08-06 DIAGNOSIS — R000 Tachycardia, unspecified: Secondary | ICD-10-CM | POA: Diagnosis not present

## 2023-08-06 LAB — BASIC METABOLIC PANEL
Anion gap: 10 (ref 5–15)
BUN: 10 mg/dL (ref 6–20)
CO2: 28 mmol/L (ref 22–32)
Calcium: 8.7 mg/dL — ABNORMAL LOW (ref 8.9–10.3)
Chloride: 101 mmol/L (ref 98–111)
Creatinine, Ser: 1.36 mg/dL — ABNORMAL HIGH (ref 0.61–1.24)
GFR, Estimated: >60 mL/min (ref >60)
Glucose, Bld: 89 mg/dL (ref 70–99)
Potassium: 3.2 mmol/L — ABNORMAL LOW (ref 3.5–5.1)
Sodium: 139 mmol/L (ref 135–145)

## 2023-08-06 LAB — DIGOXIN LEVEL: Digoxin Level: <0.2 ng/mL — ABNORMAL LOW (ref 0.8–2.0)

## 2023-08-06 LAB — BRAIN NATRIURETIC PEPTIDE: B Natriuretic Peptide: 124.6 pg/mL — ABNORMAL HIGH (ref 0.0–100.0)

## 2023-08-06 MED ORDER — POTASSIUM CHLORIDE CRYS ER 20 MEQ PO TBCR
60.0000 meq | EXTENDED_RELEASE_TABLET | Freq: Every day | ORAL | 5 refills | Status: DC
  Filled 2023-08-06: qty 90, 30d supply, fill #0

## 2023-08-06 MED ORDER — METOPROLOL SUCCINATE ER 25 MG PO TB24
25.0000 mg | ORAL_TABLET | Freq: Every day | ORAL | 3 refills | Status: DC
Start: 2023-08-06 — End: 2023-11-17
  Filled 2023-08-06: qty 90, 90d supply, fill #0

## 2023-08-06 NOTE — Progress Notes (Signed)
 ADVANCED HF CLINIC NOTE  Primary Care: Patient, [name] Primary Cardiologist: Olga Millers, MD HF Cardiologist: Dr. Gala Romney  Chief Complaint: Heart [name]  HPI: Donald Murray is a morbidly obese 38 y.o. male with HTN, previous IVDA (heroin), tobacco use and systolic HF (onset 9/21).  Admitted to [facility] 9/21 with new onset HF in setting of severe HTN 174/128. ECG with sinus tach and frequent PVCs. Echo EF 20-25% Moderate RV dysfunction.   HF Consultation for the first time 04/24/20. 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 Zio placed to quantify PVC burden - 4.3%.  Admitted 12/22 with ADHF. Echo LVEF <20% w/ global HK, no visible thrombus, RV okay, mild MR.  Cath 06/12/21 with normal cors. EF < 10% Elevated filling pressures (PCWP 28) and low output (CI 1.5). Lasix increased to 80 bid.  Sleep study 02/23: Severe OSA, moderate central sleep apnea. Waiting for CPAP machine, reports he was told there is a backorder on supplies.  Seen by Dr. Graciela Husbands in 7/23 and planning ICD. But hasn't been done due to scheduling issues.   Echo 11/24: EF 25%, RV moderately down  06/24/23. Seen in HF clinic. Volume overloaded.  Given 80 mg Furoscix. He wore on body infuser 4 hours then he accidentally pulled off. He reports lots of urine output.   Today he returns for HF follow up. Overall feeling fine. He has SOB walking up steps. Denies palpitations, abnormal bleeding, CP, dizziness, edema, or PND/Orthopnea. Appetite ok. No fever or chills. Not weighing at home. Taking all medications. 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 Smoking 1/2 ppd. Not sure if he wants an ICD. No ETOH or drug use.  Cardiac Studies: - Echo 11/24: EF 25%, RV moderately down - Echo 2/24: EF 25-30%. RV moderately HK - Echo 09/24/21: EF 20-25% RV moderately HK - Echo 10/20: EF 60-65%   - CPX test 07/18/21 FVC 5.12  (92%)      FEV1 4.34  (96%)        FEV1/FVC 85   (103%)        MVV  164  (93%)   Resting HR: 126 Standing HR:125 Peak HR: 175   (95% age predicted max HR)  BP rest (standing): 92/70 BP peak: 148/72  Peak VO2: 21.6 (87% predicted peak VO2)  When adjusted to the patient's ideal body weight of 180.3 lb (81.8 kg) the peak VO2 is 41.6 ml/kg (ibw)/min (99% of the ibw-adjusted predicted).  VE/VCO2 slope:  45  VE/MVV:  92%  O2pulse:  19 (90% predicted O2pulse)   - RHC 1/23 Ao = 91/72 (81) LV = 108/37 RA = 9 RV = 51/15 PA = 66/38 (50) PCW = 28 Fick cardiac output/index = 3.9/1.5 PVR = 5.7 SVR = 1,484 Ao sat = 98% PA sat = 55%, 54% PAPI = 3.1  - Zio 12/21 Sinus - average HR 104 4.3% PVCs  Past Medical History:  Diagnosis Date   CHF (congestive heart failure) (HCC)    Hypertension    Obesity    Snoring    Current Outpatient Medications  Medication Sig Dispense Refill   dapagliflozin propanediol (FARXIGA) 10 MG TABS tablet Take 1 tablet (10 mg) by mouth daily before breakfast. 30 tablet 11   digoxin (LANOXIN) 0.125 MG tablet Take 1 tablet (0.125 mg total) by mouth daily. 90 tablet 3   ivabradine (CORLANOR) 7.5 MG TABS tablet Take 1 tablet by mouth 2 times daily with a meal. 60  tablet 11   metolazone (ZAROXOLYN) 2.5 MG tablet Take 1 tablet (2.5 mg total) by mouth daily as needed. 4 tablet 1   metoprolol succinate (TOPROL XL) 25 MG 24 hr tablet Take 0.5 tablets (12.5 mg total) by mouth daily. 45 tablet 3   potassium chloride SA (KLOR-CON M) 20 MEQ tablet Take 2 tablets (40 mEq total) by mouth daily. Take an extra 2 tablets (40 meq) on days you take metolazone.  **INS MAX 2 TABLETS/DAY** 200 tablet 3   sacubitril-valsartan (ENTRESTO) 97-103 MG Take 1 tablet by mouth 2 (two) times daily. 60 tablet 6   Semaglutide-Weight Management (WEGOVY) 1 MG/0.5ML SOAJ Inject 1 mg into the skin once a week. 2 mL 0   spironolactone (ALDACTONE) 25 MG tablet Take 1 tablet (25 mg total) by mouth daily. 90 tablet 3   torsemide (DEMADEX) 20 MG tablet Take 4 tablets (80 mg  total) by mouth daily. 120 tablet 5   No current facility-administered medications for this encounter.   No Known Allergies  Social History   Socioeconomic History   Marital status: Single    Spouse name: Not on file   Number of children: Not on file   Years of education: Not on file   Highest education level: Not on file  Occupational History   Not on file  Tobacco Use   Smoking status: Every Day    Current packs/day: 1.00    Average packs/day: 1 pack/day for 16.0 years (16.0 ttl pk-yrs)    Types: Cigarettes   Smokeless tobacco: Never  Vaping Use   Vaping status: Some Days   Substances: Nicotine  Substance and Sexual Activity   Alcohol use: Not Currently    Comment: Pt stated "2 years clean"   Drug use: Not Currently    Comment: Pt stated "It was opiates"   Sexual activity: Not on file  Other Topics Concern   Not on file  Social History Narrative   Not on file   Social Drivers of Health   Financial Resource Strain: High Risk (04/08/2022)   Overall Financial Resource Strain (CARDIA)    Difficulty of Paying Living Expenses: Very hard  Food Insecurity: No Food Insecurity (01/17/2021)   Hunger Vital Sign    Worried About Running Out of Food in the Last Year: Never true    Ran Out of Food in the Last Year: Never true  Transportation Needs: No Transportation Needs (01/17/2021)   PRAPARE - Administrator, Civil Service (Medical): No    Lack of Transportation (Non-Medical): No  Physical Activity: Not on file  Stress: Not on file  Social Connections: Not on file  Intimate Partner Violence: Not on file   Family History  Problem Relation Age of Onset   Hypertension Mother    Hypertension Father     BP 122/78   Pulse 94   Ht 5\' 11"  (1.803 m)   Wt (!) 174.4 kg (384 lb 6.4 oz)   SpO2 98%   BMI 53.61 kg/m   Wt Readings from Last 3 Encounters:  08/06/23 (!) 174.4 kg (384 lb 6.4 oz)  07/13/23 (!) 178.2 kg (392 lb 12.8 oz)  06/25/23 (!) 173.9 kg (383 lb  6.4 oz)    PHYSICAL EXAM: General:  NAD. No resp difficulty, walked into clinic HEENT: Normal Neck: Supple. No JVD. Cor: Regular rate & rhythm. No rubs, gallops or murmurs. Lungs: Clear, diminished in bases Abdomen: Soft, obese, nontender, nondistended.  Extremities: No cyanosis, clubbing, rash, edema  Neuro: Alert & oriented x 3, moves all 4 extremities w/o difficulty. Affect pleasant.  ASSESSMENT & PLAN:  1. Chronic Systolic HF - diagnosed 9/21 Echo EF 20-25% RV moderately HK - suspect HTN vs PVC-mediated (PVC burden 4.3% - probably not high enough to cause CM) - cath 1/23 no CAD EF < 10% - CPX 2/23 pVO2: 21.6 (87% predicted peak VO2) corrected for ibw pVO2 41.6 ml/kg (ibw)/min (99% of the ibw-adjusted predicted). Slope:45 O2pulse:  19 (90% pred) - Echo 09/24/21: EF 20-25% RV moderately HK - Echo 2/24: EF 25-30%. RV moderately HK - Echo 11/24: EF 25%, RV moderately down - CPX test reassuring VO2 but slope high  - NYHA IIb-III. Volume looks OK - Increase Toprol XL to 25 mg daily. - Continue torsemide 80 mg daily + 40 KCL daily.  - Continue metolazone 2.5 mg + 40 KCL PRN, has not needed recently - He has PRN Furoscix at home. - Continue Farxiga 10 mg daily  - Continue Entresto 97/103 mg bid - Continue digoxin 0.125 mg daily. - Continue spiro 25 mg daily. - Continue Ivabradine 7.5 mg bid. - Continue to follow for need for advanced therapies. We discussed the potential for VAD again but weight is getting quite high.  - Labs today. - he is undecided about ICD. Refer back to Dr. Graciela Husbands to discuss.  2. HTN - BP stable today. - Continue meds as above  3. Sinus Tach  - Rate better controlled today.  - Increase Toprol XL to 25 mg daily. - Continue Ivabradine.  - Continue digoxin 0.125 mg daily.   4.  H/O Frequent PVCs - Zio 12/21 4.3% PVCs - probably not high enough burden to cause CM  - Has severe OSA.   5.  Morbid obesity - Body mass index is 53.61 kg/m. - He is on [facility],  has not had meaningful weight loss - Consider switch to tirzepatide.  6. OSA, Severe - Not on CPAP yet.  - Cannot afford. Engage HFSW for resources.  7.  Tobacco use - Smoking 1/2 ppd - Discussed smoking cessation - Offered patches, he declines  8. H/O IVDA (heroin) - Last used 2020  - No change  Follow up in 3 months with Dr. Gala Romney.  Anderson Malta Zeth Buday FNP-BC 11:29 AM

## 2023-08-06 NOTE — Patient Instructions (Addendum)
 Thank you for coming in today  If you had labs drawn today, any labs that are abnormal the clinic will call you No news is good news  You have been referred to EP for ICD , their office will call you for further appointment details    Medications: Increase Toprol XL to 25 mg  1 tablet daily   Follow up appointments:  Your physician recommends that you schedule a follow-up appointment in:  3 months With Dr. Gala Romney  Please call our office to schedule the follow-up appointment in April 2025 .    Do the following things EVERYDAY: Weigh yourself in the morning before breakfast. Write it down and keep it in a log. Take your medicines as prescribed Eat low salt foods--Limit salt (sodium) to 2000 mg per day.  Stay as active as you can everyday Limit all fluids for the day to less than 2 liters   At the Advanced Heart Failure Clinic, you and your health needs are our priority. As part of our continuing mission to provide you with exceptional heart care, we have created designated Provider Care Teams. These Care Teams include your primary Cardiologist (physician) and Advanced Practice Providers (APPs- Physician Assistants and Nurse Practitioners) who all work together to provide you with the care you need, when you need it.   You may see any of the following providers on your designated Care Team at your next follow up: Dr Arvilla Meres Dr Marca Ancona Dr. Marcos Eke, NP Robbie Lis, [place] [facility] [place], [place] Brynda Peon, NP Karle Plumber, PharmD   Please be sure to bring in all your medications bottles to every appointment.    Thank you for choosing [facility]  If you have any questions or concerns before your next appointment please send [place] a message through [place] or call our office at [phone].    TO LEAVE A MESSAGE FOR THE NURSE SELECT OPTION 2, PLEASE LEAVE A MESSAGE INCLUDING: YOUR  NAME DATE OF BIRTH CALL BACK NUMBER REASON FOR CALL**this is important as we prioritize the call backs  YOU WILL RECEIVE A CALL BACK THE SAME DAY AS LONG AS YOU CALL BEFORE 4:00 PM

## 2023-08-06 NOTE — Telephone Encounter (Addendum)
 Patient's kcl medication has been changed and pt's chart updated. In addition pt's lab orders placed and appointment scheduled.   Pt aware, agreeable, and verbalized understanding ----- Message from Jacklynn Ganong sent at 08/06/2023  3:50 PM EDT ----- Labs ok but K a little low.    Increase KCL to 60 daily. Repeat BMET in 7-10 days.

## 2023-08-12 ENCOUNTER — Telehealth: Payer: Self-pay | Admitting: Cardiology

## 2023-08-12 NOTE — Telephone Encounter (Signed)
*  STAT* If patient is at the pharmacy, call can be transferred to refill team.   1. Which medications need to be refilled? (please list name of each medication and dose if known) Semaglutide-Weight Management (WEGOVY) 1 MG/0.5ML SOAJ   2. Which pharmacy/location (including street and city if local pharmacy) is medication to be sent to? [place] [facility]   3. Do they need a 30 day or 90 day supply? 90

## 2023-08-13 ENCOUNTER — Other Ambulatory Visit (HOSPITAL_COMMUNITY): Payer: Self-pay

## 2023-08-13 MED ORDER — WEGOVY 1.7 MG/0.75ML ~~LOC~~ SOAJ
1.7000 mg | SUBCUTANEOUS | 0 refills | Status: DC
  Filled 2023-08-13 – 2023-08-17 (×5): qty 3, 28d supply, fill #0

## 2023-08-13 MED ORDER — WEGOVY 1.7 MG/0.75ML ~~LOC~~ SOAJ
1.7000 mg | SUBCUTANEOUS | 0 refills | Status: DC
  Filled 2023-08-13: qty 3, 28d supply, fill #0

## 2023-08-13 NOTE — Addendum Note (Signed)
 Addended by: Cheree Ditto on: 08/13/2023 09:25 AM   Modules accepted: Orders

## 2023-08-13 NOTE — Addendum Note (Signed)
 Addended by: Tylene Fantasia on: 08/13/2023 08:36 AM   Modules accepted: Orders

## 2023-08-16 ENCOUNTER — Ambulatory Visit (HOSPITAL_COMMUNITY)
Admission: RE | Admit: 2023-08-16 | Discharge: 2023-08-16 | Disposition: A | Discharge status: Home / Self Care | Payer: MEDICAID | Source: Ambulatory Visit | Attending: Cardiology | Admitting: Cardiology

## 2023-08-16 ENCOUNTER — Other Ambulatory Visit (HOSPITAL_COMMUNITY): Payer: Self-pay

## 2023-08-16 ENCOUNTER — Telehealth: Payer: Self-pay

## 2023-08-16 ENCOUNTER — Other Ambulatory Visit: Payer: Self-pay

## 2023-08-16 DIAGNOSIS — I5022 Chronic systolic (congestive) heart failure: Secondary | ICD-10-CM

## 2023-08-16 NOTE — Telephone Encounter (Signed)
 Pharmacy Patient Advocate Encounter   Received notification from Physician's Office that prior authorization for [facility] is required/requested.   Insurance verification completed.   The patient is insured through [facility] .   Per test claim: PA required; PA submitted to above mentioned insurance via CoverMyMeds Key/confirmation #/EOC [id number] Status is pending

## 2023-08-16 NOTE — Telephone Encounter (Signed)
 PA request has been Submitted. New Encounter has been or will be created for follow up. For additional info see Pharmacy Prior Auth telephone encounter from 08/16/23.

## 2023-08-17 ENCOUNTER — Other Ambulatory Visit (HOSPITAL_COMMUNITY): Payer: Self-pay

## 2023-08-17 ENCOUNTER — Telehealth (HOSPITAL_COMMUNITY): Payer: Self-pay | Admitting: Cardiology

## 2023-08-17 NOTE — Telephone Encounter (Signed)
Message sent as FYI. 

## 2023-08-17 NOTE — Telephone Encounter (Signed)
-----   Message from [place] sent at 08/17/2023 10:40 AM EDT ----- Regarding: NTI - Digoxin Change This message is to notify Donald Murray HF care team that the manufacturer for the narrow therapeutic index medication Digoxin is being discontinued and the patient will be on a new manufacturer effective this refill and subsequent. Please document in his chart accordingly and contact our pharmacy team for any further questions or concerns. Thank you.

## 2023-08-17 NOTE — Telephone Encounter (Signed)
 Pharmacy Patient Advocate Encounter  Received notification from [facility] that Prior Authorization for [facility] has been APPROVED from 08/17/23 to 08/16/24. Ran test claim, Copay is $4. This test claim was processed through [facility]- copay amounts may vary at other pharmacies due to pharmacy/plan contracts, or as the patient moves through the different stages of their insurance plan.

## 2023-08-18 ENCOUNTER — Other Ambulatory Visit (HOSPITAL_COMMUNITY): Payer: Self-pay

## 2023-08-20 ENCOUNTER — Other Ambulatory Visit (HOSPITAL_COMMUNITY): Payer: Self-pay

## 2023-08-22 ENCOUNTER — Other Ambulatory Visit: Payer: Self-pay

## 2023-08-22 ENCOUNTER — Encounter (HOSPITAL_BASED_OUTPATIENT_CLINIC_OR_DEPARTMENT_OTHER): Payer: Self-pay | Admitting: Emergency Medicine

## 2023-08-22 DIAGNOSIS — I5020 Unspecified systolic (congestive) heart failure: Secondary | ICD-10-CM | POA: Insufficient documentation

## 2023-08-22 DIAGNOSIS — K439 Ventral hernia without obstruction or gangrene: Secondary | ICD-10-CM | POA: Insufficient documentation

## 2023-08-22 DIAGNOSIS — I110 Hypertensive heart disease with heart failure: Secondary | ICD-10-CM | POA: Diagnosis not present

## 2023-08-22 DIAGNOSIS — R109 Unspecified abdominal pain: Secondary | ICD-10-CM | POA: Diagnosis present

## 2023-08-22 LAB — BASIC METABOLIC PANEL WITH GFR
Anion gap: 11 (ref 5–15)
BUN: 13 mg/dL (ref 6–20)
CO2: 27 mmol/L (ref 22–32)
Calcium: 8.8 mg/dL — ABNORMAL LOW (ref 8.9–10.3)
Chloride: 99 mmol/L (ref 98–111)
Creatinine, Ser: 1.19 mg/dL (ref 0.61–1.24)
GFR, Estimated: >60 mL/min (ref >60)
Glucose, Bld: 83 mg/dL (ref 70–99)
Potassium: 3.5 mmol/L (ref 3.5–5.1)
Sodium: 137 mmol/L (ref 135–145)

## 2023-08-22 LAB — CBC
HCT: 51 % (ref 39.0–52.0)
Hemoglobin: 16.4 g/dL (ref 13.0–17.0)
MCH: 26 pg (ref 26.0–34.0)
MCHC: 32.2 g/dL (ref 30.0–36.0)
MCV: 81 fL (ref 80.0–100.0)
Platelets: 382 10*3/uL (ref 150–400)
RBC: 6.3 MIL/uL — ABNORMAL HIGH (ref 4.22–5.81)
RDW: 18.9 % — ABNORMAL HIGH (ref 11.5–15.5)
WBC: 17.1 10*3/uL — ABNORMAL HIGH (ref 4.0–10.5)
nRBC: 0 % (ref 0.0–0.2)

## 2023-08-22 NOTE — ED Triage Notes (Addendum)
 Pt c/o bilateral, intermittent flank pain with chills, denies fever/urinary symptoms/n/v, pt endorses diarrhea

## 2023-08-23 ENCOUNTER — Ambulatory Visit: Payer: MEDICAID | Admitting: Internal Medicine

## 2023-08-23 ENCOUNTER — Emergency Department (HOSPITAL_BASED_OUTPATIENT_CLINIC_OR_DEPARTMENT_OTHER): Payer: MEDICAID

## 2023-08-23 ENCOUNTER — Emergency Department (HOSPITAL_BASED_OUTPATIENT_CLINIC_OR_DEPARTMENT_OTHER)
Admission: EM | Admit: 2023-08-23 | Discharge: 2023-08-23 | Disposition: A | Discharge status: Home / Self Care | Payer: MEDICAID | Attending: Emergency Medicine | Admitting: Emergency Medicine

## 2023-08-23 ENCOUNTER — Other Ambulatory Visit (HOSPITAL_COMMUNITY): Payer: Self-pay

## 2023-08-23 DIAGNOSIS — K439 Ventral hernia without obstruction or gangrene: Secondary | ICD-10-CM

## 2023-08-23 DIAGNOSIS — R109 Unspecified abdominal pain: Secondary | ICD-10-CM

## 2023-08-23 LAB — URINALYSIS, ROUTINE W REFLEX MICROSCOPIC
Bacteria, UA: NONE SEEN
Bilirubin Urine: NEGATIVE
Glucose, UA: NEGATIVE mg/dL
Ketones, ur: NEGATIVE mg/dL
Leukocytes,Ua: NEGATIVE
Nitrite: NEGATIVE
Specific Gravity, Urine: 1.02 (ref 1.005–1.030)
pH: 5.5 (ref 5.0–8.0)

## 2023-08-23 LAB — LACTIC ACID, PLASMA: Lactic Acid, Venous: 1.4 mmol/L (ref 0.5–1.9)

## 2023-08-23 MED ORDER — MORPHINE SULFATE (PF) 4 MG/ML IV SOLN
4.0000 mg | Freq: Once | INTRAVENOUS | Status: AC
  Administered 2023-08-23: 4 mg via INTRAVENOUS
  Filled 2023-08-23: qty 1

## 2023-08-23 MED ORDER — OXYCODONE-ACETAMINOPHEN 5-325 MG PO TABS
1.0000 | ORAL_TABLET | ORAL | 0 refills | Status: DC | PRN
  Filled 2023-08-23: qty 15, 3d supply, fill #0

## 2023-08-23 MED ORDER — ONDANSETRON 4 MG PO TBDP
4.0000 mg | ORAL_TABLET | Freq: Three times a day (TID) | ORAL | 0 refills | Status: DC | PRN
  Filled 2023-08-23: qty 20, 7d supply, fill #0

## 2023-08-23 MED ORDER — OXYCODONE-ACETAMINOPHEN 5-325 MG PO TABS
1.0000 | ORAL_TABLET | Freq: Once | ORAL | Status: AC
  Administered 2023-08-23: 1 via ORAL
  Filled 2023-08-23: qty 1

## 2023-08-23 MED ORDER — LOPERAMIDE HCL 2 MG PO CAPS
4.0000 mg | ORAL_CAPSULE | Freq: Once | ORAL | Status: AC
  Administered 2023-08-23: 4 mg via ORAL
  Filled 2023-08-23: qty 2

## 2023-08-23 MED ORDER — IOHEXOL 300 MG/ML  SOLN
100.0000 mL | Freq: Once | INTRAMUSCULAR | Status: AC | PRN
  Administered 2023-08-23: 100 mL via INTRAVENOUS

## 2023-08-23 NOTE — ED Notes (Signed)
 Patient transported to CT

## 2023-08-23 NOTE — ED Notes (Signed)
 Pt c/o bilateral flank pain comes and goes Denies any urinary symptoms Had kidney stones as a child none since

## 2023-08-23 NOTE — ED Notes (Addendum)
 Per CT  Contrast dye infiltrated left arm IV site warm compress applied Slight swelling noted Pt not c/o pain to the site

## 2023-08-23 NOTE — ED Provider Notes (Signed)
 [place] [facility]ERGENC[facility] DEPARTMENT AT [facility] Provider Note   CSN: [id number] Arrival date & time: 08/22/23  2219     History  Chief Complaint  Patient presents with   Flank Pain    Donald Murray is a 38 y.o. male.  The history is provided by the patient.  Flank Pain  He has history of hyper failure he has history of hypertension, obesity, systolic heart failure and comes in complaining of abdominal pain and diarrhea.  Days ago and is continuing although it is slowing down.  He denies nausea or vomiting.  He denies any fever or chills.  He started having generalized abdominal pain 2 days ago and pain radiates to both flanks.  Nothing makes his pain better, nothing makes it worse.  He denies any urinary difficulty other than urinary frequency secondary to taking torsemide.  He has taking acetaminophen plus ibuprofen without significant pain relief.   Home Medications Prior to Admission medications   Medication Sig Start Date End Date Taking? Authorizing Provider  ondansetron (ZOFRAN-ODT) 4 MG disintegrating tablet Take 1 tablet (4 mg total) by mouth every 8 (eight) hours as needed for nausea or vomiting. 08/23/23  Yes Dione Booze, MD  oxyCODONE-acetaminophen (PERCOCET) 5-325 MG tablet Take 1 tablet by mouth every 4 (four) hours as needed for moderate pain (pain score 4-6). 08/23/23  Yes Dione Booze, MD  dapagliflozin propanediol (FARXIGA) 10 MG TABS tablet Take 1 tablet (10 mg) by mouth daily before breakfast. 12/03/22   Bensimhon, Bevelyn Buckles, MD  digoxin (LANOXIN) 0.125 MG tablet Take 1 tablet (0.125 mg total) by mouth daily. 11/02/22   Bensimhon, Bevelyn Buckles, MD  ivabradine (CORLANOR) 7.5 MG TABS tablet Take 1 tablet by mouth 2 times daily with a meal. 11/02/22   Bensimhon, Bevelyn Buckles, MD  metolazone (ZAROXOLYN) 2.5 MG tablet Take 1 tablet (2.5 mg total) by mouth daily as needed. 07/13/23   Bensimhon, Bevelyn Buckles, MD  metoprolol succinate (TOPROL XL) 25 MG 24 hr tablet Take 1 tablet (25  mg total) by mouth daily. 08/06/23   Milford, Anderson Malta, FNP  potassium chloride SA (KLOR-CON M) 20 MEQ tablet Take 3 tablets (60 mEq total) by mouth daily. 08/06/23   Milford, Anderson Malta, FNP  sacubitril-valsartan (ENTRESTO) 97-103 MG Take 1 tablet by mouth 2 (two) times daily. 03/17/23   Bensimhon, Bevelyn Buckles, MD  Semaglutide-Weight Management (WEGOVY) 1.7 MG/0.75ML SOAJ Inject 1.7 mg into the skin once a week. 08/13/23   Lewayne Bunting, MD  spironolactone (ALDACTONE) 25 MG tablet Take 1 tablet (25 mg total) by mouth daily. 12/03/22   Bensimhon, Bevelyn Buckles, MD  torsemide (DEMADEX) 20 MG tablet Take 4 tablets (80 mg total) by mouth daily. 07/13/23   Bensimhon, Bevelyn Buckles, MD      Allergies    Patient has no known allergies.    Review of Systems   Review of Systems  Genitourinary:  Positive for flank pain.  All other systems reviewed and are negative.   Physical Exam Updated Vital Signs BP (!) 150/83   Pulse (!) 106   Temp 97.6 F (36.4 C)   Resp 20   Ht 5\' 11"  (1.803 m)   Wt (!) 174.2 kg   SpO2 98%   BMI 53.56 kg/m  Physical Exam Vitals and nursing note reviewed.   Morbidly obese 38 year old male, resting comfortably and in no acute distress. Vital signs are significant for borderline elevated heart rate and slightly elevated respiratory rate. Oxygen saturation is  93%, which is normal. Head is normocephalic and atraumatic. PERRLA, EOMI.  Back is nontender and there is no CVA tenderness. Lungs are clear without rales, wheezes, or rhonchi. Chest is nontender. Heart has regular rate and rhythm without murmur. Abdomen is soft, flat, nontender. Extremities have 1+ edema, full range of motion is present. Skin is warm and dry without rash. Neurologic: Mental status is normal, moves all extremities equally.  ED Results / Procedures / Treatments   Labs (all labs ordered are listed, but only abnormal results are displayed) Labs Reviewed  URINALYSIS, ROUTINE W REFLEX MICROSCOPIC -  Abnormal; Notable for the following components:      Result Value   Hgb urine dipstick MODERATE (*)    Protein, ur TRACE (*)    All other components within normal limits  BASIC METABOLIC PANEL WITH GFR - Abnormal; Notable for the following components:   Calcium 8.8 (*)    All other components within normal limits  CBC - Abnormal; Notable for the following components:   WBC 17.1 (*)    RBC 6.30 (*)    RDW 18.9 (*)    All other components within normal limits  LACTIC ACID, PLASMA   Radiology CT ABDOMEN PELVIS W CONTRAST Result Date: 08/23/2023 CLINICAL DATA:  Acute nonlocalized abdominal pain. Intermittent flank pain with chills. Diarrhea. EXAM: CT ABDOMEN AND PELVIS WITH CONTRAST TECHNIQUE: Multidetector CT imaging of the abdomen and pelvis was performed using the standard protocol following bolus administration of intravenous contrast. RADIATION DOSE REDUCTION: This exam was performed according to the departmental dose-optimization program which includes automated exposure control, adjustment of the mA and/or kV according to patient size and/or use of iterative reconstruction technique. CONTRAST:  OMNIPAQUE IOHEXOL 300 MG/ML  SOLN COMPARISON:  04/15/2014 CT abdomen pelvis FINDINGS: Lower chest: No acute abnormality. Hepatobiliary: Hepatic steatosis. Unremarkable gallbladder and biliary tree. Pancreas: Unremarkable. Spleen: Unremarkable. Adrenals/Urinary Tract: Normal adrenal glands. No urinary calculi or hydronephrosis. Unremarkable bladder. Stomach/Bowel: Normal caliber large and small bowel. Mobile cecum located in the midline anterior abdomen. Multiple fat containing ventral abdominal wall hernias on both sides midline. The proximal ascending colon and terminal ileum herniate into left-sided ventral hernias. The appendix herniates into a right ventral hernia. No evidence of appendicitis. No bowel wall thickening or hypoenhancement. Vascular/Lymphatic: No significant vascular findings are  present. No enlarged abdominal or pelvic lymph nodes. Reproductive: Unremarkable. Other: At least 3 low left paramidline and what right paramidline ventral abdominal wall hernias containing fat and mild stranding. Herniation of the appendix into the right paramidline ventral hernia. Herniation of the proximal ascending colon and terminal ileum into the left-sided hernias. Musculoskeletal: No acute fracture IMPRESSION: 1. Multiple fat containing ventral abdominal wall hernias on both sides of midline. There is stranding about the fat in the hernias. Recommend clinical correlation for strangulation/incarceration. 2. Herniation of the appendix into a right paramidline ventral hernia without evidence of appendicitis. 3. Herniation of the proximal ascending colon and terminal ileum into left-sided ventral hernias. No evidence of bowel obstruction or ischemia. 4. Hepatic steatosis. Electronically Signed   By: Minerva Fester M.D.   On: 08/23/2023 03:09    Procedures Procedures    Medications Ordered in ED Medications  oxyCODONE-acetaminophen (PERCOCET/ROXICET) 5-325 MG per tablet 1 tablet (has no administration in time range)  morphine (PF) 4 MG/ML injection 4 mg (4 mg Intravenous Given 08/23/23 0229)  loperamide (IMODIUM) capsule 4 mg (4 mg Oral Given 08/23/23 0229)  iohexol (OMNIPAQUE) 300 MG/ML solution 100 mL (100 mLs  Intravenous Contrast Given 08/23/23 0234)    ED Course/ Medical Decision Making/ A&P                                 Medical Decision Making Amount and/or Complexity of Data Reviewed Labs: ordered. Radiology: ordered.  Risk Prescription drug management.   Diarrhea and abdominal pain.  Consider viral enteritis, diverticulitis.  With bilateral symptoms and negative exam, doubt urolithiasis.  I have reviewed his laboratory tests, and my interpretation is moderate leukocytosis which is nonspecific, normal basic metabolic panel, urinalysis with 11-20 RBCs but no bacteria.  I have  ordered morphine for pain and I have ordered a CT scan of abdomen and pelvis.  CT scan shows multiple fat-containing ventral abdominal wall hernias with stranding of the fat in the hernias, also herniation of the ascending colon and terminal ileum and a left-sided ventral hernia but no evidence of bowel obstruction or ischemia.  I have reevaluated the patient, and because of body habitus, I cannot be clear where the hernias are located.  I have ordered a lactic acid level which has come back normal.  I have discussed the case with Dr. Freida Busman on-call for general surgery, and she felt that with stranding around fat-containing hernias that there is no emergent surgical condition and recommended follow-up in her office.  I have ordered an additional dose of oxycodone-acetaminophen and I am discharging with with prescriptions for ondansetron oral dissolving tablet and oxycodone-acetaminophen.  I am referring him to general surgery for follow-up and given strict return precautions.  Final Clinical Impression(s) / ED Diagnoses Final diagnoses:  Abdominal pain, unspecified abdominal location  Ventral hernia without obstruction or gangrene    Rx / DC Orders ED Discharge Orders          Ordered    ondansetron (ZOFRAN-ODT) 4 MG disintegrating tablet  Every 8 hours PRN        08/23/23 0434    oxyCODONE-acetaminophen (PERCOCET) 5-325 MG tablet  Every 4 hours PRN        08/23/23 0434              Dione Booze, MD 08/23/23 [phone]

## 2023-08-23 NOTE — Discharge Instructions (Addendum)
 Your pain seems to be coming from your hernias.  Please make an appointment with the surgeon to discuss options.  At any point, if your pain is not being adequately controlled at home, please return to the emergency department.  Also, if you are vomiting and nausea is not being controlled by the medication, also return to the emergency department.

## 2023-09-01 ENCOUNTER — Encounter (HOSPITAL_BASED_OUTPATIENT_CLINIC_OR_DEPARTMENT_OTHER): Payer: Self-pay

## 2023-09-01 ENCOUNTER — Other Ambulatory Visit (HOSPITAL_BASED_OUTPATIENT_CLINIC_OR_DEPARTMENT_OTHER): Payer: Self-pay

## 2023-09-01 ENCOUNTER — Emergency Department (HOSPITAL_BASED_OUTPATIENT_CLINIC_OR_DEPARTMENT_OTHER)
Admission: EM | Admit: 2023-09-01 | Discharge: 2023-09-01 | Disposition: A | Discharge status: Home / Self Care | Payer: MEDICAID | Source: Home / Self Care | Attending: Emergency Medicine | Admitting: Emergency Medicine

## 2023-09-01 ENCOUNTER — Other Ambulatory Visit: Payer: Self-pay

## 2023-09-01 ENCOUNTER — Emergency Department (HOSPITAL_BASED_OUTPATIENT_CLINIC_OR_DEPARTMENT_OTHER): Payer: MEDICAID

## 2023-09-01 DIAGNOSIS — I110 Hypertensive heart disease with heart failure: Secondary | ICD-10-CM | POA: Insufficient documentation

## 2023-09-01 DIAGNOSIS — K439 Ventral hernia without obstruction or gangrene: Secondary | ICD-10-CM | POA: Insufficient documentation

## 2023-09-01 DIAGNOSIS — I509 Heart failure, unspecified: Secondary | ICD-10-CM | POA: Insufficient documentation

## 2023-09-01 HISTORY — DX: Unspecified abdominal hernia without obstruction or gangrene: K46.9

## 2023-09-01 LAB — CBC WITH DIFFERENTIAL/PLATELET
Abs Immature Granulocytes: 0.1 10*3/uL — ABNORMAL HIGH (ref 0.00–0.07)
Basophils Absolute: 0.1 10*3/uL (ref 0.0–0.1)
Basophils Relative: 1 %
Eosinophils Absolute: 0.2 10*3/uL (ref 0.0–0.5)
Eosinophils Relative: 2 %
HCT: 51.7 % (ref 39.0–52.0)
Hemoglobin: 16.4 g/dL (ref 13.0–17.0)
Immature Granulocytes: 1 %
Lymphocytes Relative: 16 %
Lymphs Abs: 1.8 10*3/uL (ref 0.7–4.0)
MCH: 25.5 pg — ABNORMAL LOW (ref 26.0–34.0)
MCHC: 31.7 g/dL (ref 30.0–36.0)
MCV: 80.5 fL (ref 80.0–100.0)
Monocytes Absolute: 0.6 10*3/uL (ref 0.1–1.0)
Monocytes Relative: 5 %
Neutro Abs: 8.6 10*3/uL — ABNORMAL HIGH (ref 1.7–7.7)
Neutrophils Relative %: 75 %
Platelets: 318 10*3/uL (ref 150–400)
RBC: 6.42 MIL/uL — ABNORMAL HIGH (ref 4.22–5.81)
RDW: 18.4 % — ABNORMAL HIGH (ref 11.5–15.5)
WBC: 11.4 10*3/uL — ABNORMAL HIGH (ref 4.0–10.5)
nRBC: 0 % (ref 0.0–0.2)

## 2023-09-01 LAB — COMPREHENSIVE METABOLIC PANEL WITH GFR
ALT: 11 U/L (ref 0–44)
AST: 10 U/L — ABNORMAL LOW (ref 15–41)
Albumin: 4.2 g/dL (ref 3.5–5.0)
Alkaline Phosphatase: 63 U/L (ref 38–126)
Anion gap: 10 (ref 5–15)
BUN: 14 mg/dL (ref 6–20)
CO2: 29 mmol/L (ref 22–32)
Calcium: 9.4 mg/dL (ref 8.9–10.3)
Chloride: 100 mmol/L (ref 98–111)
Creatinine, Ser: 1.13 mg/dL (ref 0.61–1.24)
GFR, Estimated: >60 mL/min (ref >60)
Glucose, Bld: 116 mg/dL — ABNORMAL HIGH (ref 70–99)
Potassium: 3 mmol/L — ABNORMAL LOW (ref 3.5–5.1)
Sodium: 139 mmol/L (ref 135–145)
Total Bilirubin: 0.3 mg/dL (ref 0.0–1.2)
Total Protein: 7.3 g/dL (ref 6.5–8.1)

## 2023-09-01 LAB — LACTIC ACID, PLASMA: Lactic Acid, Venous: 1.4 mmol/L (ref 0.5–1.9)

## 2023-09-01 LAB — LIPASE, BLOOD: Lipase: 49 U/L (ref 11–51)

## 2023-09-01 MED ORDER — ONDANSETRON 4 MG PO TBDP
4.0000 mg | ORAL_TABLET | Freq: Three times a day (TID) | ORAL | 0 refills | Status: DC | PRN
  Filled 2023-09-01: qty 20, 7d supply, fill #0

## 2023-09-01 MED ORDER — OXYCODONE HCL 5 MG PO TABS
5.0000 mg | ORAL_TABLET | Freq: Four times a day (QID) | ORAL | 0 refills | Status: DC | PRN
  Filled 2023-09-01: qty 10, 3d supply, fill #0

## 2023-09-01 MED ORDER — POTASSIUM CHLORIDE CRYS ER 20 MEQ PO TBCR
20.0000 meq | EXTENDED_RELEASE_TABLET | Freq: Two times a day (BID) | ORAL | 0 refills | Status: DC
  Filled 2023-09-01: qty 6, 3d supply, fill #0

## 2023-09-01 MED ORDER — SODIUM CHLORIDE 0.9 % IV BOLUS
1000.0000 mL | Freq: Once | INTRAVENOUS | Status: AC
  Administered 2023-09-01: 1000 mL via INTRAVENOUS

## 2023-09-01 MED ORDER — FENTANYL CITRATE PF 50 MCG/ML IJ SOSY
50.0000 ug | PREFILLED_SYRINGE | Freq: Once | INTRAMUSCULAR | Status: AC
  Administered 2023-09-01: 50 ug via INTRAVENOUS
  Filled 2023-09-01: qty 1

## 2023-09-01 MED ORDER — IOHEXOL 300 MG/ML  SOLN
100.0000 mL | Freq: Once | INTRAMUSCULAR | Status: AC | PRN
  Administered 2023-09-01: 100 mL via INTRAVENOUS

## 2023-09-01 NOTE — ED Notes (Signed)
 Transport to ct

## 2023-09-01 NOTE — ED Triage Notes (Signed)
 In for eval of bilateral flank/abd pain onset 08/20/2023. Reports pain lessened than returned on Monday (4-7) and worsened. Was seen on 3/31 for same. + Diarrhea. Denies N/V/chills/fever.

## 2023-09-01 NOTE — ED Notes (Signed)
 Dc instructions reviewed with patient. Patient voiced understanding. Dc with belongings.

## 2023-09-01 NOTE — ED Provider Notes (Signed)
 [place] EMERGENCY DEPARTMENT AT [facility] Provider Note   CSN: [id number] Arrival date & time: 09/01/23  4098     History  Chief Complaint  Patient presents with   Abdominal Pain    Donald Murray is a 38 y.o. male.  Here with ongoing abdominal pain.  History of hypertension obesity CHF.  Seen here recently diagnosed with some hernias.  Medications have helped at home with narcotics.  He has been having intermittent pain.  Denies any nausea or vomiting.  He still having bowel movements having some diarrhea.  Pain all over at times.  Has not been able to follow-up with general surgery yet.  Pain kind of intensified the last 2 days.  Denies any pain urination.  No chest pain weakness numbness tingling.  The history is provided by the patient.       Home Medications Prior to Admission medications   Medication Sig Start Date End Date Taking? Authorizing Provider  ondansetron (ZOFRAN-ODT) 4 MG disintegrating tablet Take 1 tablet (4 mg total) by mouth every 8 (eight) hours as needed. 09/01/23  Yes Bertie Simien, DO  oxyCODONE (ROXICODONE) 5 MG immediate release tablet Take 1 tablet (5 mg total) by mouth every 6 (six) hours as needed for up to 10 doses. 09/01/23  Yes Kalik Hoare, DO  potassium chloride SA (KLOR-CON M) 20 MEQ tablet Take 1 tablet (20 mEq total) by mouth 2 (two) times daily. 09/01/23  Yes Anais Koenen, DO  dapagliflozin propanediol (FARXIGA) 10 MG TABS tablet Take 1 tablet (10 mg) by mouth daily before breakfast. 12/03/22   Bensimhon, Bevelyn Buckles, MD  digoxin (LANOXIN) 0.125 MG tablet Take 1 tablet (0.125 mg total) by mouth daily. 11/02/22   Bensimhon, Bevelyn Buckles, MD  ivabradine (CORLANOR) 7.5 MG TABS tablet Take 1 tablet by mouth 2 times daily with a meal. 11/02/22   Bensimhon, Bevelyn Buckles, MD  metolazone (ZAROXOLYN) 2.5 MG tablet Take 1 tablet (2.5 mg total) by mouth daily as needed. 07/13/23   Bensimhon, Bevelyn Buckles, MD  metoprolol succinate (TOPROL XL) 25 MG 24 hr tablet  Take 1 tablet (25 mg total) by mouth daily. 08/06/23   Milford, Anderson Malta, FNP  oxyCODONE-acetaminophen (PERCOCET) 5-325 MG tablet Take 1 tablet by mouth every 4 (four) hours as needed for moderate pain (pain score 4-6). 08/23/23   Dione Booze, MD  sacubitril-valsartan (ENTRESTO) 97-103 MG Take 1 tablet by mouth 2 (two) times daily. 03/17/23   Bensimhon, Bevelyn Buckles, MD  Semaglutide-Weight Management (WEGOVY) 1.7 MG/0.75ML SOAJ Inject 1.7 mg into the skin once a week. 08/13/23   Lewayne Bunting, MD  spironolactone (ALDACTONE) 25 MG tablet Take 1 tablet (25 mg total) by mouth daily. 12/03/22   Bensimhon, Bevelyn Buckles, MD  torsemide (DEMADEX) 20 MG tablet Take 4 tablets (80 mg total) by mouth daily. 07/13/23   Bensimhon, Bevelyn Buckles, MD      Allergies    Patient has no known allergies.    Review of Systems   Review of Systems  Physical Exam Updated Vital Signs BP (!) 133/92   Pulse 93   Temp 98.7 F (37.1 C) (Oral)   Resp 20   Ht 5\' 11"  (1.803 m)   Wt (!) 174.2 kg   SpO2 95%   BMI 53.56 kg/m  Physical Exam Vitals and nursing note reviewed.  Constitutional:      General: He is not in acute distress.    Appearance: He is well-developed. He is not ill-appearing.  HENT:  Head: Normocephalic and atraumatic.     Nose: Nose normal.     Mouth/Throat:     Mouth: Mucous membranes are moist.  Eyes:     Extraocular Movements: Extraocular movements intact.     Conjunctiva/sclera: Conjunctivae normal.     Pupils: Pupils are equal, round, and reactive to light.  Cardiovascular:     Rate and Rhythm: Normal rate and regular rhythm.     Pulses: Normal pulses.     Heart sounds: Normal heart sounds. No murmur heard. Pulmonary:     Effort: Pulmonary effort is normal. No respiratory distress.     Breath sounds: Normal breath sounds.  Abdominal:     Palpations: Abdomen is soft.     Tenderness: There is abdominal tenderness.  Musculoskeletal:        General: No swelling.     Cervical back: Normal  range of motion and neck supple.  Skin:    General: Skin is warm and dry.     Capillary Refill: Capillary refill takes less than 2 seconds.  Neurological:     General: No focal deficit present.     Mental Status: He is alert.  Psychiatric:        Mood and Affect: Mood normal.     ED Results / Procedures / Treatments   Labs (all labs ordered are listed, but only abnormal results are displayed) Labs Reviewed  CBC WITH DIFFERENTIAL/PLATELET - Abnormal; Notable for the following components:      Result Value   WBC 11.4 (*)    RBC 6.42 (*)    MCH 25.5 (*)    RDW 18.4 (*)    Neutro Abs 8.6 (*)    Abs Immature Granulocytes 0.10 (*)    All other components within normal limits  COMPREHENSIVE METABOLIC PANEL WITH GFR - Abnormal; Notable for the following components:   Potassium 3.0 (*)    Glucose, Bld 116 (*)    AST 10 (*)    All other components within normal limits  LIPASE, BLOOD  LACTIC ACID, PLASMA    EKG None  Radiology CT ABDOMEN PELVIS W CONTRAST Result Date: 09/01/2023 CLINICAL DATA:  Abdominal pain, acute, nonlocalized hernias EXAM: CT ABDOMEN AND PELVIS WITH CONTRAST TECHNIQUE: Multidetector CT imaging of the abdomen and pelvis was performed using the standard protocol following bolus administration of intravenous contrast. RADIATION DOSE REDUCTION: This exam was performed according to the departmental dose-optimization program which includes automated exposure control, adjustment of the mA and/or kV according to patient size and/or use of iterative reconstruction technique. CONTRAST:  OMNIPAQUE IOHEXOL 300 MG/ML  SOLN COMPARISON:  08/23/2023 FINDINGS: Lower chest: No acute abnormality Hepatobiliary: Diffuse low-density throughout the liver compatible with fatty infiltration. No focal abnormality. Gallbladder unremarkable. Pancreas: No focal abnormality or ductal dilatation. Spleen: No focal abnormality.  Normal size. Adrenals/Urinary Tract: No adrenal abnormality. No  focal renal abnormality. No stones or hydronephrosis. Urinary bladder is unremarkable. Stomach/Bowel: Few scattered sigmoid diverticula. No evidence of bowel obstruction or inflammatory process. Terminal ileum herniates into a left abdominal ventral hernia. Cecum appears to now be within abdominal cavity. Herniation of the normal appendix into a right ventral hernia. Vascular/Lymphatic: No evidence of aneurysm or adenopathy. Reproductive: No visible focal abnormality. Other: No free fluid or free air. Musculoskeletal: No acute bony abnormality. 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 Appearance is stable since prior study. No evidence of bowel obstruction. Hepatic steatosis. No acute findings. Electronically Signed   By: Charlett Nose M.D.  On: 09/01/2023 10:49    Procedures Procedures    Medications Ordered in ED Medications  fentaNYL (SUBLIMAZE) injection 50 mcg (50 mcg Intravenous Given 09/01/23 0823)  sodium chloride 0.9 % bolus 1,000 mL (1,000 mLs Intravenous New Bag/Given 09/01/23 0826)  iohexol (OMNIPAQUE) 300 MG/ML solution 100 mL (100 mLs Intravenous Contrast Given 09/01/23 0924)  fentaNYL (SUBLIMAZE) injection 50 mcg (50 mcg Intravenous Given 09/01/23 1010)    ED Course/ Medical Decision Making/ A&P                                 Medical Decision Making Amount and/or Complexity of Data Reviewed Labs: ordered. Radiology: ordered.  Risk Prescription drug management.   Ottavio Norem is here with ongoing abdominal pain.  Per my chart review he was seen here recently for the same.  He had multiple fat-containing hernias as well as some herniated bowel including colon and terminal ileum but there is no evidence of bowel obstruction or ischemia.  He also had radiation of his appendix into the right paramidline ventral hernia without evidence of appendicitis.  He has some stranding around fat-containing hernias.  Ultimately pains continued he has not followed  up with general surgery.  Differential diagnosis could be progression of some of these hernias or other acute process such as bowel we will get CBC CMP lipase CT scan abdomen pelvis.  Will give IV pain medicine IV fluids and reevaluate.  Per my review and interpretation of labs potassium is 3 will give oral repletion.  Lab work otherwise with no significant leukocytosis electrolyte abnormality or kidney injury.  Lactic acid normal.  CT scan stable from last weeks.  Complex ventral hernia containing terminal ileum and appendix but there is no evidence of bowel obstruction or strangulation or incarceration.  I talked with Natale Milch with general surgery team to try to help expedite close follow-up which he will do.  Will prescribe oxycodone for breakthrough pain.  At this time there is no need for any emergent surgery.  He understand strict return precautions.  Discharged in good condition.  This chart was dictated using voice recognition software.  Despite best efforts to proofread,  errors can occur which can change the documentation meaning.         Final Clinical Impression(s) / ED Diagnoses Final diagnoses:  Ventral hernia without obstruction or gangrene    Rx / DC Orders ED Discharge Orders          Ordered    oxyCODONE (ROXICODONE) 5 MG immediate release tablet  Every 6 hours PRN        09/01/23 1134    ondansetron (ZOFRAN-ODT) 4 MG disintegrating tablet  Every 8 hours PRN        09/01/23 1134    potassium chloride SA (KLOR-CON M) 20 MEQ tablet  2 times daily        09/01/23 1134              Gadge Hermiz, DO 09/01/23 1134

## 2023-09-01 NOTE — Discharge Instructions (Signed)
 Continue narcotic pain medicine as you have.  I have refilled this for you.  Continue Zofran as needed.  Continue potassium.  Follow-up with general surgery.

## 2023-09-02 ENCOUNTER — Inpatient Hospital Stay (HOSPITAL_COMMUNITY)
Admission: AD | Admit: 2023-09-02 | Discharge: 2023-09-04 | DRG: 354 | Disposition: A | Discharge status: Home / Self Care | Payer: MEDICAID | Source: Ambulatory Visit | Attending: Family Medicine | Admitting: Family Medicine

## 2023-09-02 ENCOUNTER — Telehealth: Payer: Self-pay

## 2023-09-02 ENCOUNTER — Encounter (HOSPITAL_COMMUNITY): Payer: Self-pay

## 2023-09-02 DIAGNOSIS — I493 Ventricular premature depolarization: Secondary | ICD-10-CM | POA: Diagnosis present

## 2023-09-02 DIAGNOSIS — I5022 Chronic systolic (congestive) heart failure: Secondary | ICD-10-CM

## 2023-09-02 DIAGNOSIS — K449 Diaphragmatic hernia without obstruction or gangrene: Secondary | ICD-10-CM | POA: Diagnosis present

## 2023-09-02 DIAGNOSIS — Z01810 Encounter for preprocedural cardiovascular examination: Secondary | ICD-10-CM | POA: Diagnosis not present

## 2023-09-02 DIAGNOSIS — K420 Umbilical hernia with obstruction, without gangrene: Principal | ICD-10-CM | POA: Diagnosis present

## 2023-09-02 DIAGNOSIS — E876 Hypokalemia: Secondary | ICD-10-CM | POA: Diagnosis present

## 2023-09-02 DIAGNOSIS — K429 Umbilical hernia without obstruction or gangrene: Secondary | ICD-10-CM | POA: Diagnosis not present

## 2023-09-02 DIAGNOSIS — Z7985 Long-term (current) use of injectable non-insulin antidiabetic drugs: Secondary | ICD-10-CM | POA: Diagnosis not present

## 2023-09-02 DIAGNOSIS — Z79899 Other long term (current) drug therapy: Secondary | ICD-10-CM | POA: Diagnosis not present

## 2023-09-02 DIAGNOSIS — K436 Other and unspecified ventral hernia with obstruction, without gangrene: Secondary | ICD-10-CM | POA: Diagnosis present

## 2023-09-02 DIAGNOSIS — I959 Hypotension, unspecified: Secondary | ICD-10-CM | POA: Diagnosis not present

## 2023-09-02 DIAGNOSIS — I5042 Chronic combined systolic (congestive) and diastolic (congestive) heart failure: Secondary | ICD-10-CM | POA: Diagnosis present

## 2023-09-02 DIAGNOSIS — K760 Fatty (change of) liver, not elsewhere classified: Secondary | ICD-10-CM | POA: Diagnosis present

## 2023-09-02 DIAGNOSIS — I10 Essential (primary) hypertension: Secondary | ICD-10-CM | POA: Diagnosis not present

## 2023-09-02 DIAGNOSIS — I5023 Acute on chronic systolic (congestive) heart failure: Secondary | ICD-10-CM

## 2023-09-02 DIAGNOSIS — F17210 Nicotine dependence, cigarettes, uncomplicated: Secondary | ICD-10-CM | POA: Diagnosis present

## 2023-09-02 DIAGNOSIS — Z8249 Family history of ischemic heart disease and other diseases of the circulatory system: Secondary | ICD-10-CM

## 2023-09-02 DIAGNOSIS — R000 Tachycardia, unspecified: Secondary | ICD-10-CM

## 2023-09-02 DIAGNOSIS — I5082 Biventricular heart failure: Secondary | ICD-10-CM | POA: Diagnosis present

## 2023-09-02 DIAGNOSIS — Z6841 Body Mass Index (BMI) 40.0 and over, adult: Secondary | ICD-10-CM

## 2023-09-02 DIAGNOSIS — Z7984 Long term (current) use of oral hypoglycemic drugs: Secondary | ICD-10-CM | POA: Diagnosis not present

## 2023-09-02 DIAGNOSIS — K439 Ventral hernia without obstruction or gangrene: Principal | ICD-10-CM | POA: Diagnosis present

## 2023-09-02 DIAGNOSIS — I110 Hypertensive heart disease with heart failure: Secondary | ICD-10-CM | POA: Diagnosis present

## 2023-09-02 DIAGNOSIS — I428 Other cardiomyopathies: Secondary | ICD-10-CM | POA: Diagnosis present

## 2023-09-02 DIAGNOSIS — G4733 Obstructive sleep apnea (adult) (pediatric): Secondary | ICD-10-CM | POA: Diagnosis present

## 2023-09-02 LAB — COMPREHENSIVE METABOLIC PANEL WITH GFR
ALT: 17 U/L (ref 0–44)
AST: 18 U/L (ref 15–41)
Albumin: 3.7 g/dL (ref 3.5–5.0)
Alkaline Phosphatase: 55 U/L (ref 38–126)
Anion gap: 13 (ref 5–15)
BUN: 10 mg/dL (ref 6–20)
CO2: 25 mmol/L (ref 22–32)
Calcium: 9.1 mg/dL (ref 8.9–10.3)
Chloride: 101 mmol/L (ref 98–111)
Creatinine, Ser: 1.05 mg/dL (ref 0.61–1.24)
GFR, Estimated: >60 mL/min (ref >60)
Glucose, Bld: 86 mg/dL (ref 70–99)
Potassium: 3.3 mmol/L — ABNORMAL LOW (ref 3.5–5.1)
Sodium: 139 mmol/L (ref 135–145)
Total Bilirubin: 0.5 mg/dL (ref 0.0–1.2)
Total Protein: 7.3 g/dL (ref 6.5–8.1)

## 2023-09-02 LAB — CBC WITH DIFFERENTIAL/PLATELET
Abs Immature Granulocytes: 0.12 10*3/uL — ABNORMAL HIGH (ref 0.00–0.07)
Basophils Absolute: 0.1 10*3/uL (ref 0.0–0.1)
Basophils Relative: 1 %
Eosinophils Absolute: 0.2 10*3/uL (ref 0.0–0.5)
Eosinophils Relative: 2 %
HCT: 51.2 % (ref 39.0–52.0)
Hemoglobin: 16.6 g/dL (ref 13.0–17.0)
Immature Granulocytes: 1 %
Lymphocytes Relative: 16 %
Lymphs Abs: 1.9 10*3/uL (ref 0.7–4.0)
MCH: 26.2 pg (ref 26.0–34.0)
MCHC: 32.4 g/dL (ref 30.0–36.0)
MCV: 80.9 fL (ref 80.0–100.0)
Monocytes Absolute: 0.6 10*3/uL (ref 0.1–1.0)
Monocytes Relative: 5 %
Neutro Abs: 8.9 10*3/uL — ABNORMAL HIGH (ref 1.7–7.7)
Neutrophils Relative %: 75 %
Platelets: 338 10*3/uL (ref 150–400)
RBC: 6.33 MIL/uL — ABNORMAL HIGH (ref 4.22–5.81)
RDW: 18.4 % — ABNORMAL HIGH (ref 11.5–15.5)
WBC: 11.7 10*3/uL — ABNORMAL HIGH (ref 4.0–10.5)
nRBC: 0 % (ref 0.0–0.2)

## 2023-09-02 LAB — MAGNESIUM: Magnesium: 2 mg/dL (ref 1.7–2.4)

## 2023-09-02 MED ORDER — TORSEMIDE 20 MG PO TABS
80.0000 mg | ORAL_TABLET | Freq: Every day | ORAL | Status: DC
  Administered 2023-09-03: 80 mg via ORAL
  Filled 2023-09-02 (×3): qty 4

## 2023-09-02 MED ORDER — SPIRONOLACTONE 25 MG PO TABS
25.0000 mg | ORAL_TABLET | Freq: Every day | ORAL | Status: DC
Start: 2023-09-03 — End: 2023-09-04
  Administered 2023-09-03: 25 mg via ORAL
  Filled 2023-09-02 (×2): qty 1

## 2023-09-02 MED ORDER — OXYCODONE-ACETAMINOPHEN 5-325 MG PO TABS
1.0000 | ORAL_TABLET | ORAL | Status: DC | PRN
  Administered 2023-09-02 – 2023-09-04 (×6): 1 via ORAL
  Filled 2023-09-02 (×7): qty 1

## 2023-09-02 MED ORDER — POTASSIUM CHLORIDE CRYS ER 20 MEQ PO TBCR
20.0000 meq | EXTENDED_RELEASE_TABLET | Freq: Two times a day (BID) | ORAL | Status: DC
  Administered 2023-09-02: 20 meq via ORAL
  Filled 2023-09-02: qty 1

## 2023-09-02 MED ORDER — MORPHINE SULFATE (PF) 2 MG/ML IV SOLN
2.0000 mg | INTRAVENOUS | Status: DC | PRN
  Administered 2023-09-02 – 2023-09-03 (×3): 2 mg via INTRAVENOUS
  Filled 2023-09-02 (×3): qty 1

## 2023-09-02 MED ORDER — NICOTINE 21 MG/24HR TD PT24
21.0000 mg | MEDICATED_PATCH | Freq: Every day | TRANSDERMAL | Status: DC
  Administered 2023-09-02 – 2023-09-04 (×3): 21 mg via TRANSDERMAL
  Filled 2023-09-02 (×3): qty 1

## 2023-09-02 MED ORDER — HEPARIN SODIUM (PORCINE) 5000 UNIT/ML IJ SOLN
5000.0000 [IU] | Freq: Three times a day (TID) | INTRAMUSCULAR | Status: DC
  Administered 2023-09-02 – 2023-09-04 (×5): 5000 [IU] via SUBCUTANEOUS
  Filled 2023-09-02 (×5): qty 1

## 2023-09-02 MED ORDER — DIGOXIN 125 MCG PO TABS
0.1250 mg | ORAL_TABLET | Freq: Every day | ORAL | Status: DC
  Administered 2023-09-03 – 2023-09-04 (×2): 0.125 mg via ORAL
  Filled 2023-09-02 (×2): qty 1

## 2023-09-02 MED ORDER — IVABRADINE HCL 5 MG PO TABS
7.5000 mg | ORAL_TABLET | Freq: Two times a day (BID) | ORAL | Status: DC
  Administered 2023-09-03 – 2023-09-04 (×2): 7.5 mg via ORAL
  Filled 2023-09-02 (×3): qty 1

## 2023-09-02 MED ORDER — METOPROLOL SUCCINATE ER 25 MG PO TB24
25.0000 mg | ORAL_TABLET | Freq: Every day | ORAL | Status: DC
  Administered 2023-09-03 – 2023-09-04 (×2): 25 mg via ORAL
  Filled 2023-09-02 (×2): qty 1

## 2023-09-02 NOTE — H&P (Signed)
 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 Freida Busman and felt okay for outpatient follow-up patient was given some Percocet and sent home with Percocet Zofran  Rtc ED 4/9 worsening abdominal pain-CT scan showed stranding in the hernias-there was no evidence of bowel obstruction strangulation or incarceration based on that CT he was hypokalemic given again oxycodone and Zofran and potassium replacement  On follow-up today with Dr. Awilda Metro in the office he was referred over for admission because of intractable nature of his pain and more importantly worsening States that pain is 4/10 with oxycodone has been able to eat drink has not had nausea vomiting pain feels like a bandlike constriction around the abdomen intensifying occasionally with movement- does not weigh himself regularly does not know his dry weight-last office weight 174 kg on 08/06/2023 and today he is about the same He is compliant on his meds  At baseline he can walk about 5 to 10 minutes without really getting too short of breath unclear how many flights of stairs he takes   Review of Systems:  As mentioned above in HPI are pertinent +'s Pertinent negatives as per below   ED Course: none    Past Medical History:  Diagnosis Date   Abdominal hernia    CHF (congestive heart failure) (HCC)     Hypertension    Obesity    Snoring    Past Surgical History:  Procedure Laterality Date   I & D EXTREMITY Right 03/04/2019   Procedure: IRRIGATION AND DEBRIDEMENT OF RIGHT ELBOW;  Surgeon: Dominica Severin, MD;  Location: [facility] OR;  Service: Orthopedics;  Laterality: Right;   IRRIGATION AND DEBRIDEMENT ABSCESS Left 03/04/2019   Procedure: Irrigation And Debridement Abscess Left hand;  Surgeon: Dominica Severin, MD;  Location: [facility] OR;  Service: Orthopedics;  Laterality: Left;   RIGHT/LEFT HEART CATH AND CORONARY ANGIOGRAPHY N/A 06/12/2021   Procedure: RIGHT/LEFT HEART CATH AND CORONARY ANGIOGRAPHY;  Surgeon: Dolores Patty, MD;  Location: [facility] INVASIVE CV LAB;  Service: Cardiovascular;  Laterality: N/A;   UMBILICAL HERNIA REPAIR      reports that he has been smoking cigarettes. He has a 16 pack-year smoking history. He has never used smokeless tobacco. He reports that he does not currently use alcohol. He reports that he does not currently use drugs.  Mobility: Independent Works at [employer] still smoker lives alone   No Known Allergies Family History  Problem Relation Age of Onset   Hypertension Mother    Hypertension Father    Prior to Admission medications   Medication Sig Start Date End Date Taking? Authorizing Provider  dapagliflozin propanediol (FARXIGA) 10 MG TABS tablet Take 1 tablet (10 mg) by mouth daily before breakfast. 12/03/22   Bensimhon, Bevelyn Buckles, MD  digoxin (LANOXIN) 0.125 MG tablet Take 1 tablet (0.125 mg total) by mouth daily. 11/02/22   Bensimhon, Bevelyn Buckles, MD  ivabradine (CORLANOR) 7.5 MG TABS  tablet Take 1 tablet by mouth 2 times daily with a meal. 11/02/22   Bensimhon, Bevelyn Buckles, MD  metolazone (ZAROXOLYN) 2.5 MG tablet Take 1 tablet (2.5 mg total) by mouth daily as needed. 07/13/23   Bensimhon, Bevelyn Buckles, MD  metoprolol succinate (TOPROL XL) 25 MG 24 hr tablet Take 1 tablet (25 mg total) by mouth daily. 08/06/23   Milford, Anderson Malta, FNP  ondansetron (ZOFRAN-ODT)  4 MG disintegrating tablet Dissolve 1 tablet under the tongue every 8 (eight) hours as needed. 09/01/23   Murray, Adam, DO  oxyCODONE (ROXICODONE) 5 MG immediate release tablet Take 1 tablet (5 mg total) by mouth every 6 (six) hours as needed for up to 10 doses. 09/01/23   Murray, Adam, DO  oxyCODONE-acetaminophen (PERCOCET) 5-325 MG tablet Take 1 tablet by mouth every 4 (four) hours as needed for moderate pain (pain score 4-6). 08/23/23   Dione Booze, MD  potassium chloride SA (KLOR-CON M) 20 MEQ tablet Take 1 tablet (20 mEq total) by mouth 2 (two) times daily. 09/01/23   Murray, Adam, DO  sacubitril-valsartan (ENTRESTO) 97-103 MG Take 1 tablet by mouth 2 (two) times daily. 03/17/23   Bensimhon, Bevelyn Buckles, MD  Semaglutide-Weight Management (WEGOVY) 1.7 MG/0.75ML SOAJ Inject 1.7 mg into the skin once a week. 08/13/23   Lewayne Bunting, MD  spironolactone (ALDACTONE) 25 MG tablet Take 1 tablet (25 mg total) by mouth daily. 12/03/22   Bensimhon, Bevelyn Buckles, MD  torsemide (DEMADEX) 20 MG tablet Take 4 tablets (80 mg total) by mouth daily. 07/13/23   Bensimhon, Bevelyn Buckles, MD    Physical Exam:  Vitals:   09/02/23 1648  BP: 117/83  Pulse: 97  Resp: 20  Temp: 97.7 F (36.5 C)  SpO2: 96%    Awake coherent no distress cannot appreciate JVD but exam limited by habitus Chest is clear no wheeze rales rhonchi S1-S2 no murmur He has some mild tachycardia ROM is intact moving 4 limbs equally His abdomen is obese soft I cannot appreciate organomegaly he does not have any specific tenderness He has trace edema in lower extremities Power is 5/5  I have personally reviewed following labs and imaging studies  Labs:  Sodium 139 potassium 3.0 BUN/creatinine 14/1.13 LFTs relatively normal lactic acid 1.4 WBC 11.4 hemoglobin 16 left shift 75% CT abdomen pelvis  Imaging studies:  CT abdomen pelvis, flex ventral hernia containing terminal ileum and appendix appearance is stable since prior no evidence of  bowel obstruction hepatic steatosis  Medical tests:  EKG independently reviewed: No EKG done  Test discussed with performing physician: Yes  Decision to obtain old records:  Yes  Review and summation of old records:  Yes  Active Problems:   * No active hospital problems. *   Assessment/Plan Hernia causing abdominal pain No specific alarm symptoms his abdomen is tender but he does have some discomfort General surgery should be aware of him-I will call Dr. Andrey Campanile to ensure the same Patient ACS NSQIP risk for  procedure for incarcerated ventral hernia repair serious complication is 11 %, 17 % for any complication and risk of readmission is 8.6 %  Defer further decision-making to surgery and cardiology-I will get an EKG given his complex prior cardiac disease obtain magnesium in the morning and keep him on a clear liquid diet Will keep n.p.o. after midnight in case he can be fit in for tomorrow  Bimodal heart failure with depressed EF on last echo 03/30/2023 Get EKG now Last was 25%-- resume  Toprol-XL 25 Aldactone 25 torsemide 80 daily Corlanor 7.5 and digoxin 00.125 would hold metolazone and entry still for now can resume postprocedure Marcelline Deist has been held Additionally  Smoker Presumed OHSS Needs outpatient sleep study   Hypokalemia No labs done today so we will obtain them now with magnesium and repeat inMorning  Superobese BMI >53 Resume Wegovy at discharge-further health maintenance   Severity of Illness: The appropriate patient status for this patient is INPATIENT. Inpatient status is judged to be reasonable and necessary in order to provide the required intensity of service to ensure the patient's safety. The patient's presenting symptoms, physical exam findings, and initial radiographic and laboratory data in the context of their chronic comorbidities is felt to place them at high risk for further clinical deterioration. Furthermore, it is not anticipated that the  patient will be medically stable for discharge from the hospital within 2 midnights of admission.   * I certify that at the point of admission it is my clinical judgment that the patient will require inpatient hospital care spanning beyond 2 midnights from the point of admission due to high intensity of service, high risk for further deterioration and high frequency of surveillance required.*   Family Communication: none  DVT ppx: Heparin for now Consults called & Whom: Cardiology, general surgery  Time spent: 45 minutes  Mahala Menghini, MD Cordelia Poche my NP partners at night for Care related issues] [facility] --[facility] [employer] OR , [email or web address]; password [facility]  09/02/2023, 4:58 PM

## 2023-09-02 NOTE — Telephone Encounter (Signed)
   Pre-operative Risk Assessment    Patient Name: Donald Murray  DOB: 03-04-1986 MRN: [id number]   Date of last office visit: 12/18/21 Sherryl Manges, MD Date of next office visit: 09/28/23 Sherryl Manges, MD   Request for Surgical Clearance    Procedure:   HIATAL HERNIA  Date of Surgery:  Clearance TBD (URGENT)                               Surgeon:  Ivar Drape, MD Surgeon's Group or Practice Name:  [facility] Phone number:  [phone] Fax number:  [phone] ATTN: Trellis Moment, CMA   Type of Clearance Requested:   - Medical    Type of Anesthesia:  General    Additional requests/questions:    SignedMarlow Baars   09/02/2023, 4:26 PM

## 2023-09-03 ENCOUNTER — Other Ambulatory Visit: Payer: Self-pay

## 2023-09-03 ENCOUNTER — Encounter (HOSPITAL_COMMUNITY)
Admission: AD | Disposition: A | Discharge status: Home / Self Care | Payer: Self-pay | Source: Ambulatory Visit | Attending: Family Medicine

## 2023-09-03 ENCOUNTER — Inpatient Hospital Stay (HOSPITAL_COMMUNITY): Payer: MEDICAID | Admitting: Certified Registered Nurse Anesthetist

## 2023-09-03 ENCOUNTER — Encounter (HOSPITAL_COMMUNITY): Payer: Self-pay | Admitting: Family Medicine

## 2023-09-03 DIAGNOSIS — K429 Umbilical hernia without obstruction or gangrene: Secondary | ICD-10-CM | POA: Diagnosis not present

## 2023-09-03 DIAGNOSIS — I10 Essential (primary) hypertension: Secondary | ICD-10-CM

## 2023-09-03 DIAGNOSIS — K436 Other and unspecified ventral hernia with obstruction, without gangrene: Secondary | ICD-10-CM | POA: Diagnosis not present

## 2023-09-03 DIAGNOSIS — I5022 Chronic systolic (congestive) heart failure: Secondary | ICD-10-CM

## 2023-09-03 DIAGNOSIS — I5082 Biventricular heart failure: Secondary | ICD-10-CM

## 2023-09-03 DIAGNOSIS — E876 Hypokalemia: Secondary | ICD-10-CM | POA: Diagnosis not present

## 2023-09-03 DIAGNOSIS — Z01810 Encounter for preprocedural cardiovascular examination: Secondary | ICD-10-CM

## 2023-09-03 LAB — COMPREHENSIVE METABOLIC PANEL WITH GFR
ALT: 17 U/L (ref 0–44)
AST: 14 U/L — ABNORMAL LOW (ref 15–41)
Albumin: 3 g/dL — ABNORMAL LOW (ref 3.5–5.0)
Alkaline Phosphatase: 48 U/L (ref 38–126)
Anion gap: 10 (ref 5–15)
BUN: 9 mg/dL (ref 6–20)
CO2: 26 mmol/L (ref 22–32)
Calcium: 8.5 mg/dL — ABNORMAL LOW (ref 8.9–10.3)
Chloride: 102 mmol/L (ref 98–111)
Creatinine, Ser: 0.96 mg/dL (ref 0.61–1.24)
GFR, Estimated: >60 mL/min (ref >60)
Glucose, Bld: 88 mg/dL (ref 70–99)
Potassium: 3.1 mmol/L — ABNORMAL LOW (ref 3.5–5.1)
Sodium: 138 mmol/L (ref 135–145)
Total Bilirubin: 0.6 mg/dL (ref 0.0–1.2)
Total Protein: 5.8 g/dL — ABNORMAL LOW (ref 6.5–8.1)

## 2023-09-03 LAB — CBC
HCT: 46.7 % (ref 39.0–52.0)
Hemoglobin: 15 g/dL (ref 13.0–17.0)
MCH: 26.1 pg (ref 26.0–34.0)
MCHC: 32.1 g/dL (ref 30.0–36.0)
MCV: 81.4 fL (ref 80.0–100.0)
Platelets: 280 10*3/uL (ref 150–400)
RBC: 5.74 MIL/uL (ref 4.22–5.81)
RDW: 17.9 % — ABNORMAL HIGH (ref 11.5–15.5)
WBC: 9 10*3/uL (ref 4.0–10.5)
nRBC: 0 % (ref 0.0–0.2)

## 2023-09-03 LAB — PROTIME-INR
INR: 1 (ref 0.8–1.2)
Prothrombin Time: 13.5 s (ref 11.4–15.2)

## 2023-09-03 LAB — MAGNESIUM: Magnesium: 2 mg/dL (ref 1.7–2.4)

## 2023-09-03 SURGERY — REPAIR, HERNIA, UMBILICAL, ROBOT-ASSISTED
Anesthesia: General

## 2023-09-03 MED ORDER — ALBUTEROL SULFATE (2.5 MG/3ML) 0.083% IN NEBU
INHALATION_SOLUTION | RESPIRATORY_TRACT | Status: AC
  Administered 2023-09-03: 2.5 mg via RESPIRATORY_TRACT
  Filled 2023-09-03: qty 3

## 2023-09-03 MED ORDER — CEFAZOLIN SODIUM-DEXTROSE 3-4 GM/150ML-% IV SOLN
INTRAVENOUS | Status: AC
  Filled 2023-09-03: qty 150

## 2023-09-03 MED ORDER — OXYCODONE HCL 5 MG PO TABS: 5.0000 mg | ORAL_TABLET | Freq: Once | ORAL | Status: DC | PRN

## 2023-09-03 MED ORDER — ONDANSETRON HCL 4 MG/2ML IJ SOLN
INTRAMUSCULAR | Status: DC | PRN
  Administered 2023-09-03: 4 mg via INTRAVENOUS

## 2023-09-03 MED ORDER — BUPIVACAINE-EPINEPHRINE (PF) 0.25% -1:200000 IJ SOLN
INTRAMUSCULAR | Status: AC
  Filled 2023-09-03: qty 30

## 2023-09-03 MED ORDER — CHLORHEXIDINE GLUCONATE 0.12 % MT SOLN: 15.0000 mL | Freq: Once | OROMUCOSAL | Status: AC

## 2023-09-03 MED ORDER — KETAMINE HCL 50 MG/5ML IJ SOSY
PREFILLED_SYRINGE | INTRAMUSCULAR | Status: AC
  Filled 2023-09-03: qty 5

## 2023-09-03 MED ORDER — ROCURONIUM BROMIDE 10 MG/ML (PF) SYRINGE
PREFILLED_SYRINGE | INTRAVENOUS | Status: AC
  Filled 2023-09-03: qty 10

## 2023-09-03 MED ORDER — FENTANYL CITRATE (PF) 100 MCG/2ML IJ SOLN: 25.0000 ug | INTRAMUSCULAR | Status: DC | PRN

## 2023-09-03 MED ORDER — LIDOCAINE 2% (20 MG/ML) 5 ML SYRINGE
INTRAMUSCULAR | Status: AC
  Filled 2023-09-03: qty 5

## 2023-09-03 MED ORDER — PROPOFOL 10 MG/ML IV BOLUS
INTRAVENOUS | Status: AC
  Filled 2023-09-03: qty 20

## 2023-09-03 MED ORDER — CHLORHEXIDINE GLUCONATE 0.12 % MT SOLN
OROMUCOSAL | Status: AC
  Administered 2023-09-03: 15 mL via OROMUCOSAL
  Filled 2023-09-03: qty 15

## 2023-09-03 MED ORDER — OXYCODONE HCL 5 MG/5ML PO SOLN: 5.0000 mg | Freq: Once | ORAL | Status: DC | PRN

## 2023-09-03 MED ORDER — CEFAZOLIN SODIUM-DEXTROSE 3-4 GM/150ML-% IV SOLN
3.0000 g | Freq: Once | INTRAVENOUS | Status: AC
  Administered 2023-09-03: 3 g via INTRAVENOUS

## 2023-09-03 MED ORDER — LACTATED RINGERS IV SOLN
INTRAVENOUS | Status: DC
Start: 2023-09-03 — End: 2023-09-03

## 2023-09-03 MED ORDER — ONDANSETRON HCL 4 MG/2ML IJ SOLN: 4.0000 mg | Freq: Four times a day (QID) | INTRAMUSCULAR | Status: DC | PRN

## 2023-09-03 MED ORDER — ACETAMINOPHEN 10 MG/ML IV SOLN
INTRAVENOUS | Status: AC
  Administered 2023-09-03: 1000 mg via INTRAVENOUS
  Filled 2023-09-03: qty 100

## 2023-09-03 MED ORDER — BUPIVACAINE LIPOSOME 1.3 % IJ SUSP
INTRAMUSCULAR | Status: AC
  Filled 2023-09-03: qty 20

## 2023-09-03 MED ORDER — ACETAMINOPHEN 10 MG/ML IV SOLN: 1000.0000 mg | Freq: Once | INTRAVENOUS | Status: AC

## 2023-09-03 MED ORDER — MIDAZOLAM HCL 2 MG/2ML IJ SOLN
INTRAMUSCULAR | Status: DC | PRN
  Administered 2023-09-03: 2 mg via INTRAVENOUS

## 2023-09-03 MED ORDER — ONDANSETRON HCL 4 MG/2ML IJ SOLN
INTRAMUSCULAR | Status: AC
Start: 2023-09-03 — End: ?
  Filled 2023-09-03: qty 2

## 2023-09-03 MED ORDER — BUPIVACAINE LIPOSOME 1.3 % IJ SUSP
INTRAMUSCULAR | Status: DC | PRN
  Administered 2023-09-03: 50 mL via SURGICAL_CAVITY

## 2023-09-03 MED ORDER — FENTANYL CITRATE (PF) 100 MCG/2ML IJ SOLN
INTRAMUSCULAR | Status: AC
  Filled 2023-09-03: qty 2

## 2023-09-03 MED ORDER — SUGAMMADEX SODIUM 200 MG/2ML IV SOLN
INTRAVENOUS | Status: DC | PRN
  Administered 2023-09-03 (×2): 200 mg via INTRAVENOUS

## 2023-09-03 MED ORDER — DEXAMETHASONE SODIUM PHOSPHATE 10 MG/ML IJ SOLN
INTRAMUSCULAR | Status: DC | PRN
  Administered 2023-09-03: 10 mg via INTRAVENOUS

## 2023-09-03 MED ORDER — FENTANYL CITRATE (PF) 250 MCG/5ML IJ SOLN
INTRAMUSCULAR | Status: DC | PRN
  Administered 2023-09-03: 100 ug via INTRAVENOUS

## 2023-09-03 MED ORDER — METHOCARBAMOL 1000 MG/10ML IJ SOLN: 500.0000 mg | Freq: Four times a day (QID) | INTRAMUSCULAR | Status: DC | PRN

## 2023-09-03 MED ORDER — LIDOCAINE 2% (20 MG/ML) 5 ML SYRINGE
INTRAMUSCULAR | Status: DC | PRN
  Administered 2023-09-03: 40 mg via INTRAVENOUS

## 2023-09-03 MED ORDER — 0.9 % SODIUM CHLORIDE (POUR BTL) OPTIME
TOPICAL | Status: DC | PRN
  Administered 2023-09-03: 1000 mL

## 2023-09-03 MED ORDER — CEFAZOLIN SODIUM-DEXTROSE 3-4 GM/150ML-% IV SOLN: 3.0000 g | Freq: Three times a day (TID) | INTRAVENOUS | Status: DC

## 2023-09-03 MED ORDER — ORAL CARE MOUTH RINSE: 15.0000 mL | Freq: Once | OROMUCOSAL | Status: AC

## 2023-09-03 MED ORDER — BUPIVACAINE HCL (PF) 0.25 % IJ SOLN
INTRAMUSCULAR | Status: AC
Start: 2023-09-03 — End: ?
  Filled 2023-09-03: qty 30

## 2023-09-03 MED ORDER — HYDROMORPHONE HCL 1 MG/ML IJ SOLN
0.5000 mg | INTRAMUSCULAR | Status: DC | PRN
  Administered 2023-09-04: 0.5 mg via INTRAVENOUS
  Filled 2023-09-03: qty 1

## 2023-09-03 MED ORDER — ALBUTEROL SULFATE (2.5 MG/3ML) 0.083% IN NEBU: 2.5000 mg | INHALATION_SOLUTION | Freq: Once | RESPIRATORY_TRACT | Status: AC

## 2023-09-03 MED ORDER — DEXAMETHASONE SODIUM PHOSPHATE 10 MG/ML IJ SOLN
INTRAMUSCULAR | Status: AC
  Filled 2023-09-03: qty 1

## 2023-09-03 MED ORDER — PHENYLEPHRINE 80 MCG/ML (10ML) SYRINGE FOR IV PUSH (FOR BLOOD PRESSURE SUPPORT)
PREFILLED_SYRINGE | INTRAVENOUS | Status: DC | PRN
  Administered 2023-09-03: 80 ug via INTRAVENOUS
  Administered 2023-09-03 (×3): 120 ug via INTRAVENOUS

## 2023-09-03 MED ORDER — ROCURONIUM BROMIDE 10 MG/ML (PF) SYRINGE
PREFILLED_SYRINGE | INTRAVENOUS | Status: DC | PRN
  Administered 2023-09-03: 60 mg via INTRAVENOUS
  Administered 2023-09-03 (×2): 40 mg via INTRAVENOUS

## 2023-09-03 MED ORDER — DEXMEDETOMIDINE HCL IN NACL 80 MCG/20ML IV SOLN
INTRAVENOUS | Status: AC
Start: 2023-09-03 — End: 2023-09-04
  Filled 2023-09-03: qty 20

## 2023-09-03 MED ORDER — POTASSIUM CHLORIDE CRYS ER 20 MEQ PO TBCR
40.0000 meq | EXTENDED_RELEASE_TABLET | Freq: Two times a day (BID) | ORAL | Status: DC
  Administered 2023-09-03 – 2023-09-04 (×3): 40 meq via ORAL
  Filled 2023-09-03 (×3): qty 2

## 2023-09-03 MED ORDER — KETAMINE HCL 10 MG/ML IJ SOLN
INTRAMUSCULAR | Status: DC | PRN
  Administered 2023-09-03: 50 mg via INTRAVENOUS

## 2023-09-03 MED ORDER — FENTANYL CITRATE (PF) 250 MCG/5ML IJ SOLN
INTRAMUSCULAR | Status: AC
Start: 2023-09-03 — End: ?
  Filled 2023-09-03: qty 5

## 2023-09-03 MED ORDER — MIDAZOLAM HCL 2 MG/2ML IJ SOLN
INTRAMUSCULAR | Status: AC
  Filled 2023-09-03: qty 2

## 2023-09-03 MED ORDER — PROPOFOL 10 MG/ML IV BOLUS
INTRAVENOUS | Status: DC | PRN
  Administered 2023-09-03: 200 mg via INTRAVENOUS

## 2023-09-03 MED ORDER — PHENYLEPHRINE HCL-NACL 20-0.9 MG/250ML-% IV SOLN
INTRAVENOUS | Status: DC | PRN
  Administered 2023-09-03: 25 ug/min via INTRAVENOUS

## 2023-09-03 SURGICAL SUPPLY — 54 items
BAG COUNTER SPONGE SURGICOUNT (BAG) IMPLANT
CHLORAPREP W/TINT 26 (MISCELLANEOUS) ×1 IMPLANT
COVER MAYO STAND STRL (DRAPES) ×1 IMPLANT
COVER SURGICAL LIGHT HANDLE (MISCELLANEOUS) ×1 IMPLANT
COVER TIP SHEARS 8 DVNC (MISCELLANEOUS) ×1 IMPLANT
DEFOGGER SCOPE WARMER CLEARIFY (MISCELLANEOUS) ×1 IMPLANT
DERMABOND ADVANCED .7 DNX12 (GAUZE/BANDAGES/DRESSINGS) ×1 IMPLANT
DEVICE TROCAR PUNCTURE CLOSURE (ENDOMECHANICALS) ×1 IMPLANT
DRAPE ARM DVNC X/XI (DISPOSABLE) ×4 IMPLANT
DRAPE COLUMN DVNC XI (DISPOSABLE) ×1 IMPLANT
DRAPE CV SPLIT W-CLR ANES SCRN (DRAPES) ×1 IMPLANT
DRAPE SURG ORHT 6 SPLT 77X108 (DRAPES) ×1 IMPLANT
DRIVER NDL MEGA SUTCUT DVNCXI (INSTRUMENTS) ×1 IMPLANT
DRIVER NDLE MEGA SUTCUT DVNCXI (INSTRUMENTS) ×1 IMPLANT
ELECT REM PT RETURN 9FT ADLT (ELECTROSURGICAL) ×1 IMPLANT
ELECTRODE REM PT RTRN 9FT ADLT (ELECTROSURGICAL) ×1 IMPLANT
FORCEPS PROGRASP DVNC XI (FORCEP) ×1 IMPLANT
GLOVE BIO SURGEON STRL SZ7.5 (GLOVE) ×5 IMPLANT
GOWN STRL REUS W/ TWL LRG LVL3 (GOWN DISPOSABLE) ×2 IMPLANT
GOWN STRL REUS W/ TWL XL LVL3 (GOWN DISPOSABLE) ×2 IMPLANT
GOWN STRL REUS W/TWL 2XL LVL3 (GOWN DISPOSABLE) ×1 IMPLANT
IRRIG SUCT STRYKERFLOW 2 WTIP (MISCELLANEOUS) IMPLANT
IRRIGATION SUCT STRKRFLW 2 WTP (MISCELLANEOUS) IMPLANT
KIT BASIN OR (CUSTOM PROCEDURE TRAY) ×1 IMPLANT
KIT TURNOVER KIT B (KITS) ×1 IMPLANT
MARKER SKIN DUAL TIP RULER LAB (MISCELLANEOUS) ×1 IMPLANT
MESH VICRYL KNITTED 12X12 (Mesh General) IMPLANT
NDL HYPO 22X1.5 SAFETY MO (MISCELLANEOUS) ×1 IMPLANT
NDL INSUFFLATION 14GA 120MM (NEEDLE) ×1 IMPLANT
NEEDLE HYPO 22X1.5 SAFETY MO (MISCELLANEOUS) ×1 IMPLANT
NEEDLE INSUFFLATION 14GA 120MM (NEEDLE) ×1 IMPLANT
OBTURATOR OPTICAL STND 8 DVNC (TROCAR) ×1 IMPLANT
OBTURATOR OPTICALSTD 8 DVNC (TROCAR) IMPLANT
PAD ARMBOARD POSITIONER FOAM (MISCELLANEOUS) ×2 IMPLANT
SCISSORS LAP 5X35 DISP (ENDOMECHANICALS) IMPLANT
SCISSORS MNPLR CVD DVNC XI (INSTRUMENTS) ×1 IMPLANT
SEAL UNIV 5-12 XI (MISCELLANEOUS) ×2 IMPLANT
SET TUBE SMOKE EVAC HIGH FLOW (TUBING) ×1 IMPLANT
SLEEVE Z-THREAD 5X100MM (TROCAR) IMPLANT
SPIKE FLUID TRANSFER (MISCELLANEOUS) ×1 IMPLANT
STOPCOCK 4 WAY LG BORE MALE ST (IV SETS) ×1 IMPLANT
SUT MNCRL AB 4-0 PS2 18 (SUTURE) ×1 IMPLANT
SUT STRATA 2-0 15 CT-1.5 (SUTURE) ×1 IMPLANT
SUT STRATA PDS 2-0 23 CT-1 (SUTURE) ×1 IMPLANT
SUT STRATA PDS 2-0 30 CT-1.5 (SUTURE) ×2 IMPLANT
SUT STRATAFIX 1PDS 45CM VIOLET (SUTURE) IMPLANT
SUT VIC AB 2-0 SH 27X BRD (SUTURE) IMPLANT
SUTURE STRAT PDS 2-0 30 CT-1.5 (SUTURE) IMPLANT
SYR 30ML SLIP (SYRINGE) ×1 IMPLANT
TOWEL GREEN STERILE FF (TOWEL DISPOSABLE) ×1 IMPLANT
TRAY FOLEY MTR SLVR 16FR STAT (SET/KITS/TRAYS/PACK) IMPLANT
TRAY LAPAROSCOPIC MC (CUSTOM PROCEDURE TRAY) ×1 IMPLANT
TROCAR ADV FIXATION 11X100MM (TROCAR) IMPLANT
TROCAR Z-THREAD OPTICAL 5X100M (TROCAR) IMPLANT

## 2023-09-03 NOTE — Anesthesia Procedure Notes (Signed)
 Arterial Line Insertion Start/End4/03/2024 2:30 PM, 09/03/2023 2:32 PM Performed by: Achille Rich, MD, Sudie Grumbling, CRNA, CRNA  Preanesthetic checklist: patient identified, IV checked, site marked, risks and benefits discussed, surgical consent, monitors and equipment checked, pre-op evaluation, timeout performed and anesthesia consent Lidocaine 1% used for infiltration Left, radial was placed Catheter size: 20 G Hand hygiene performed , maximum sterile barriers used  and Seldinger technique used Allen's test indicative of satisfactory collateral circulation Attempts: 1 Procedure performed using ultrasound guided technique. Ultrasound Notes:anatomy identified, needle tip was noted to be adjacent to the nerve/plexus identified and no ultrasound evidence of intravascular and/or intraneural injection Following insertion, Biopatch. Post procedure assessment: normal  Patient tolerated the procedure well with no immediate complications.

## 2023-09-03 NOTE — Op Note (Signed)
 Patient: Donald Murray (1985/08/11, [id number])  Date of Surgery: 09/03/2023  Preoperative Diagnosis: RECURRENT INCARCERATED UMBILICAL HERNIA (measuring 7.8 cm x 8.1 cm on preoperative CT)  Postoperative Diagnosis: RECURRENT INCARCERATED UMBILICAL HERNIA (measuring 7.8 cm x 8.1 cm on preoperative CT)  Surgical Procedure:  Robotic recurrent incarcerated umbilical hernia repair with mesh  Bilateral posterior rectus myofascial release  Removal of previous intraperitoneal mesh    Operative Team Members:  Surgeons and Role:    * Seneca Gadbois, Hyman Hopes, MD - Primary   Anesthesiologist: Achille Rich, MD; Jairo Ben, MD CRNA: Darlina Guys, CRNA; Sudie Grumbling, CRNA; Sandie Ano, CRNA   Anesthesia: General   Fluids:  Total I/O In: 1000 [I.V.:1000] Out: -   Complications: None  Drains:  None  Specimen: None  Disposition:  PACU - hemodynamically stable.  Plan of Care: Continue inpatient care, advance diet as tolerated  Indications for Procedure: Donald Murray is a 38 y.o. male who presented with a incarcerated recurrent umbilical hernia involving the right colon and appendix.  He did not have obstructive symptoms but he had tenderness at the hernia and the hernia was no longer able to be reduced when it had recently been able to be reduced.  I was concerned he may develop strangulation issues with the appendix or a Richter's type pathology based on the CT scan so I recommended urgent surgery.  He was admitted to the internal medicine service and a preoperative cardiac evaluation was completed due to his extensive cardiac history.  He was deemed high risk for surgery from a cardiac perspective but elected to proceed.   I recommended robotic ventral hernia repair with mesh.  The procedure itself as well as the risks, benefits and alternatives were described.  The risks discussed included but were not limited to the risk of infection, bleeding, damage to nearby  structures, recurrent hernia, chronic pain, and mesh complication requiring removal.  After a full discussion and all questions answered, the patient granted consent to proceed.  Findings:  Hernia Location: Ventral hernia location: Umbilical (M3) Hernia Size:  7.8 cm x 8.1 cm  Mesh Size &Type:  30 cm x 30 cm Bard Soft Mesh 10cm x 10 cm vicryl mesh to patch the hernia defect in the posterior layer Mesh Position: Sublay - Retromuscular Myofascial Releases: Bilateral Myofascial Release: Posterior rectus myofascial release  This intraperitoneal Ethicon ultra Pro mesh removed  Description of Procedure: The patient was positioned supine, moderately flexed at the umbilical level, padded and secured on the operating table.  A timeout procedure was performed.    What is described is a robotic, totally extraperitoneal retromuscular incisional hernia repair with bilateral rectus myofascial release and retromuscular mesh placement.  The case started with a laparoscopic reduction of the incarcerated hernia and laparoscopic lysis of adhesions to ensure the bowel was viable prior to proceeding with formal hernia repair.  Laparoscopic Portion: Three 5 mm trocars were placed up the left abdominal wall and the abdomen was inflated to 15 mmHg.  I worked carefully to reduce the fatty tissue and right colon from the recurrent umbilical hernia.  This was incarcerated and required sharp and blunt dissection.  I was able to reduce the hernia contents without any injury to the bowel.  The bowel was inspected.  The appendix was identified and appeared viable.  The mid ascending colon was the portion of the colon that was most stuck within the hernia defect but did appear viable.  The hernia contents were able to  be fully reduced so I decided proceed with formal robotic incisional hernia repair with retromuscular mesh placement.  The initial 3 trocars were open to vent the abdomen while I worked in the retromuscular  space.  The retrorectus space was entered in the LEFT hypochondrium, at approximately the midclavicular line utilizing a 5 mm optical-viewing trocar.  Upon safe entry into this space, it was insufflated while performing a blunt dissection with the camera still in the optical trocar. A rectus myofascial release was performed on the LEFT side. Dissection was carried out laterally in the retromuscular plane to the edge of the rectus sheath progressively disconnecting the rectus muscle from the underlying posterior rectus sheath. Both the segmental innervation as well as the intercostal artery and vein brances to the rectus muscle were individually preserved.    During the left sided retrorectus dissection, a 12 mm trocar was placed into the lateral most edge of the retrorectus space.  With these initial trocars in position, the medial most aspect of the retrorectus plane was identified, and the posterior sheath was visualized as it inserted on the linea alba. The posterior sheath was incised with cautery entering the preperitoneal plane. A crossover was performed dissecting under the linea alba in the preperitoneal plane until the right rectus sheath was identified.  After identification of the right rectus sheath, it was incised vertically to enter the retrorectus space on the right. A rectus myofascial release was performed on the RIGHT side.  Blunt dissection was carried out laterally in the retromuscular plane to the edge of the rectus sheath progressively disconnecting the rectus muscle from the underlying posterior rectus sheath. Both the segmental innervation as well as the intercostal artery and vein brances to the rectus muscle were individually preserved.   At this juncture, both retrorectus planes were initially connected to each other and there was space for further trocar placement. An 8 mm robotic trocar was placed in the midclavicular line in right retrorectus space.  A 8mm robotic trocar was  placed within the left rectus musculature in the upper abdomen, and not through the linea alba.  The initial 5 mm access trocar in the midclavicular line within the left retrorectus space was switched out for an 8 mm robotic trocar.   Robotic Portion: The Intuitive daVinci Xi surgical robot was docked in the standard fashion and the procedure begun from the robotic console. A Prograsp instrument and monopolar shears were used for the dissection.  Dissection was carried down inferiorly preserving the peritoneum and the preperitoneal fat in the midline as it was gently dissected off of the overlying linea alba.  On the right side, the posterior rectus sheath was progressively disconnected from its insertion on the linea alba. This allowed for progression of the right side rectus myofascial release.  The rectus myofascial release accomplished medialization of the posterior rectus sheath towards the midline and disinsertion of the rectus muscle from its surrounding fascia, and thus its encasement in the rectus sheath, allowing for widening of the rectus muscle and transfer of the rectus flap towards the midline.  This will allow for future inset of the medial aspect of the flap for abdominal wall reconstruction.  Similarly, on the left side, the posterior rectus sheath was also progressively disconnected from its insertion on the linea alba.  This allowed for progression of the left side rectus myofascial release.  The rectus myofascial release accomplished medialization of the posterior rectus sheath towards the midline and disinsertion of the rectus muscle from  its surrounding fascia, and thus its encasement in the rectus sheath, allowing for widening of the rectus muscle and transfer of the rectus flap towards the midline.  This will allow for future inset of the medial aspect of the flap for abdominal wall reconstruction.  During the dissection of the midline the hernia defect was identified and the hernia  sac was not reducible, therefore the hernia peritoneum was incised circumferentially around the edge of the hernia defect which left a defect within the peritoneum in the midline.  This left a 10 cm x 10 cm round defect.  It seemed I may be able to pull the posterior rectus sheath together or I may be able to pull the superior tissues to oppose the inferior tissues to close this defect, however I was concerned about the tension on this closure so I decided to patch the defect with a piece of Vicryl mesh.  Vicryl mesh was cut to a 10 cm diameter circle and sewn in circumferentially to patch the defect using running 2-0 Ethicon Stratafix Spiral PDS suture.  Both the left and the right rectus myofascial releases were performed towards the lower abdomen, past the arcuate line bilaterally.  During this dissection, the peritoneum and preperitoneal fat in the midline were further preserved below the hernia as they were dissected off of the overlying linea alba.   The hernia defect area was now visualized fully.  The hernia defects were located in the umbilical region.  The hernia defect was closed utilizing a continuous, #1 Ethicon Stratafix Symmetric PDS Plus suture.  The hernia defect, and subsequently the rectus musculature, came together well for a complete abdominal wall reconstruction.  The dissected out retrorectus space was measured with a metric ruler so as to determine the size of the proposed mesh.    The robot was undocked and the laparoscope was inserted, inspecting for hemostasis.  The mesh deployment was performed laparoscopically.  Laparoscopic Portion:  A transversus abdominis plane (TAP) block was performed bilaterally with a mixture of marcaine and Exparel.  The anesthetic was first injected into the plane between the transversus abdominis and internal abdominal oblique muscles on the left. The TAP was repeated on the contralateral side.   A piece of Bard Soft was opened and trimmed to 30  cm x 30 cm. The mesh was advanced into the retrorectus space and the mesh positioned flat against the intact posterior rectus sheaths. The mesh was not fixated as it occupied the entire retromuscular plane, and also covered all of the trocars.  The trocars were removed and the skin closed with 4-0 Monocryl subcuticular sutures and skin glue.   Ivar Drape, MD General, Bariatric, & Minimally Invasive Surgery [facility], [place]

## 2023-09-03 NOTE — Anesthesia Preprocedure Evaluation (Signed)
 Anesthesia Evaluation  Patient identified by MRN, date of birth, ID band Patient awake    Reviewed: Allergy & Precautions, H&P , NPO status , Patient's Chart, lab work & pertinent test results  Airway Mallampati: II   Neck ROM: full    Dental   Pulmonary sleep apnea , Current Smoker   breath sounds clear to auscultation       Cardiovascular hypertension,  Rhythm:regular Rate:Normal     Neuro/Psych    GI/Hepatic   Endo/Other    Class 4 obesity  Renal/GU      Musculoskeletal   Abdominal   Peds  Hematology   Anesthesia Other Findings   Reproductive/Obstetrics                             Anesthesia Physical Anesthesia Plan  ASA: 2  Anesthesia Plan: General   Post-op Pain Management:    Induction: Intravenous  PONV Risk Score and Plan: 1 and Ondansetron, Dexamethasone, Midazolam and Treatment may vary due to age or medical condition  Airway Management Planned: Oral ETT  Additional Equipment:   Intra-op Plan:   Post-operative Plan: Extubation in OR  Informed Consent: I have reviewed the patients History and Physical, chart, labs and discussed the procedure including the risks, benefits and alternatives for the proposed anesthesia with the patient or authorized representative who has indicated his/her understanding and acceptance.     Dental advisory given  Plan Discussed with: CRNA, Anesthesiologist and Surgeon  Anesthesia Plan Comments:        Anesthesia Quick Evaluation

## 2023-09-03 NOTE — Consult Note (Signed)
 Advanced Heart Failure Team Consult Note   Primary Physician: Patient, [name] Cardiologist:  Olga Millers, MD [facility]: Dr. Gala Romney   Reason for Consultation: Pre-Op Cardiac Risk Assessment Anticipated Surgery: Robotic Ventral Hernia Repair, General Anesthesia    HPI:    Donald Murray is seen today for evaluation of Pre-op cardiac risk assessment at the request of Dr. Mahala Menghini, Internal Medicine.   Donald Murray is a morbidly obese 38 y.o. male with HTN, previous IVDA (heroin), tobacco use and systolic HF (onset 9/21).   Admitted to [facility] 9/21 with new onset HF in setting of severe HTN 174/128. ECG with sinus tach and frequent PVCs. Echo EF 20-25% Moderate RV dysfunction.    HF Consultation for the first time 04/24/20. 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 Zio placed to quantify PVC burden - 4.3%.   Admitted 12/22 with ADHF. Echo LVEF <20% w/ global HK, no visible thrombus, RV okay, mild MR.   Cath 06/12/21 with normal cors. EF < 10% Elevated filling pressures (PCWP 28) and low output (CI 1.5). Lasix increased to 80 bid.   Sleep study 02/23: Severe OSA, moderate central sleep apnea. Waiting for CPAP machine, reports he was told there is a backorder on supplies.   Seen by Dr. Graciela Husbands in 7/23 and planning ICD. But hasn't been done due to scheduling issues.    Echo 11/24: EF 25%, RV moderately down   He has complex ventral hernia that is felt to be high risk for strangulation if not surgically repaired. His hernia was previously reducible but now tender and not reducible. General surgery recommending robotic incisional hernia repair, possible posterior rectus myofascial release, possible transversus abdominis myofascial release and possible mesh placement. AHF team asked to see pre-operatively.   He reports stable, NYHA Class II symptoms, confounded by obesity/deconditioning. He denies CP. Reports full med compliance. Not checking wt daily at home.   Current GDMT: digoxin 0.125,  ivabradine 7.5 bid, Toprol XL 25 mg daily , Spironolactone 25 mg daily. Torsemide 80 mg daily. Has been on Entresto 97-103 bid  +farxiga at home but being healed pre-operatively.    Echo 11/24 - 1. Left ventricular ejection fraction, by estimation, is 25%. The left  ventricle has severely decreased function. The left ventricle demonstrates  global hypokinesis. The left ventricular internal cavity size was mildly  to moderately dilated. There is  mild concentric left ventricular hypertrophy. Left ventricular diastolic  parameters are indeterminate.   2. Right ventricular systolic function is moderately reduced. The right  ventricular size is moderately enlarged.   3. The mitral valve is grossly normal. Trivial mitral valve  regurgitation. No evidence of mitral stenosis.   4. The aortic valve is tricuspid. Aortic valve regurgitation is not  visualized. No aortic stenosis is present.   Comparison(s): No significant change from prior study. Prior images  reviewed side by side.   Home Medications Prior to Admission medications   Medication Sig Start Date End Date Taking? Authorizing Provider  dapagliflozin propanediol (FARXIGA) 10 MG TABS tablet Take 1 tablet (10 mg) by mouth daily before breakfast. 12/03/22  Yes Bensimhon, Bevelyn Buckles, MD  digoxin (LANOXIN) 0.125 MG tablet Take 1 tablet (0.125 mg total) by mouth daily. 11/02/22  Yes Bensimhon, Bevelyn Buckles, MD  Ibuprofen-Acetaminophen (ADVIL DUAL ACTION PO) Take 2 tablets by mouth daily as needed (pain, headache).   Yes [provider]  ivabradine (CORLANOR) 7.5 MG TABS tablet Take 1 tablet by mouth 2 times daily with a meal. 11/02/22  Yes Bensimhon, Bevelyn Buckles, MD  metolazone (ZAROXOLYN) 2.5 MG tablet Take 1 tablet (2.5 mg total) by mouth daily as needed. 07/13/23  Yes Bensimhon, Bevelyn Buckles, MD  metoprolol succinate (TOPROL XL) 25 MG 24 hr tablet Take 1 tablet (25 mg total) by mouth daily. 08/06/23  Yes Milford, Anderson Malta, FNP  ondansetron  (ZOFRAN-ODT) 4 MG disintegrating tablet Dissolve 1 tablet under the tongue every 8 (eight) hours as needed. 09/01/23  Yes Curatolo, Adam, DO  oxyCODONE (ROXICODONE) 5 MG immediate release tablet Take 1 tablet (5 mg total) by mouth every 6 (six) hours as needed for up to 10 doses. 09/01/23  Yes Curatolo, Adam, DO  potassium chloride SA (KLOR-CON M) 20 MEQ tablet Take 1 tablet (20 mEq total) by mouth 2 (two) times daily. 09/01/23  Yes Curatolo, Adam, DO  sacubitril-valsartan (ENTRESTO) 97-103 MG Take 1 tablet by mouth 2 (two) times daily. 03/17/23  Yes Bensimhon, Bevelyn Buckles, MD  Semaglutide-Weight Management (WEGOVY) 1.7 MG/0.75ML SOAJ Inject 1.7 mg into the skin once a week. Patient taking differently: Inject 1.7 mg into the skin every Monday. 08/13/23  Yes Lewayne Bunting, MD  spironolactone (ALDACTONE) 25 MG tablet Take 1 tablet (25 mg total) by mouth daily. 12/03/22  Yes Bensimhon, Bevelyn Buckles, MD  torsemide (DEMADEX) 20 MG tablet Take 4 tablets (80 mg total) by mouth daily. 07/13/23  Yes Bensimhon, Bevelyn Buckles, MD    Past Medical History: Past Medical History:  Diagnosis Date   Abdominal hernia    CHF (congestive heart failure) (HCC)    Hypertension    Obesity    Snoring     Past Surgical History: Past Surgical History:  Procedure Laterality Date   I & D EXTREMITY Right 03/04/2019   Procedure: IRRIGATION AND DEBRIDEMENT OF RIGHT ELBOW;  Surgeon: Dominica Severin, MD;  Location: [facility] OR;  Service: Orthopedics;  Laterality: Right;   IRRIGATION AND DEBRIDEMENT ABSCESS Left 03/04/2019   Procedure: Irrigation And Debridement Abscess Left hand;  Surgeon: Dominica Severin, MD;  Location: [facility] OR;  Service: Orthopedics;  Laterality: Left;   RIGHT/LEFT HEART CATH AND CORONARY ANGIOGRAPHY N/A 06/12/2021   Procedure: RIGHT/LEFT HEART CATH AND CORONARY ANGIOGRAPHY;  Surgeon: Dolores Patty, MD;  Location: [facility] INVASIVE CV LAB;  Service: Cardiovascular;  Laterality: N/A;   UMBILICAL HERNIA REPAIR      Family  History: Family History  Problem Relation Age of Onset   Hypertension Mother    Hypertension Father     Social History: Social History   Socioeconomic History   Marital status: Single    Spouse name: Not on file   Number of children: Not on file   Years of education: Not on file   Highest education level: Not on file  Occupational History   Not on file  Tobacco Use   Smoking status: Every Day    Current packs/day: 1.00    Average packs/day: 1 pack/day for 16.0 years (16.0 ttl pk-yrs)    Types: Cigarettes   Smokeless tobacco: Never  Vaping Use   Vaping status: Some Days   Substances: Nicotine  Substance and Sexual Activity   Alcohol use: Not Currently    Comment: Pt stated "2 years clean"   Drug use: Not Currently    Comment: Pt stated "It was opiates"   Sexual activity: Not on file  Other Topics Concern   Not on file  Social History Narrative   Not on file   Social Drivers of Health   Financial Resource Strain:  High Risk (04/08/2022)   Overall Financial Resource Strain (CARDIA)    Difficulty of Paying Living Expenses: Very hard  Food Insecurity: No Food Insecurity (09/02/2023)   Hunger Vital Sign    Worried About Running Out of Food in the Last Year: Never true    Ran Out of Food in the Last Year: Never true  Transportation Needs: No Transportation Needs (09/02/2023)   PRAPARE - Administrator, Civil Service (Medical): No    Lack of Transportation (Non-Medical): No  Physical Activity: Not on file  Stress: Not on file  Social Connections: Not on file    Allergies:  No Known Allergies  Objective:    Vital Signs:   Temp:  [97.7 F (36.5 C)-98.1 F (36.7 C)] 98.1 F (36.7 C) (04/11 0400) Pulse Rate:  [71-99] 71 (04/11 0927) Resp:  [16-20] 20 (04/11 0016) BP: (93-117)/(61-83) 107/77 (04/11 0927) SpO2:  [94 %-97 %] 97 % (04/11 0400) Weight:  [174.8 kg] 174.8 kg (04/10 1638) Last BM Date : 09/01/23  Weight change: Filed Weights   09/02/23  1638  Weight: (!) 174.8 kg    Intake/Output:   Intake/Output Summary (Last 24 hours) at 09/03/2023 1109 Last data filed at 09/02/2023 2000 Gross per 24 hour  Intake 360 ml  Output --  Net 360 ml      Physical Exam    General:  super morbid obesity. No resp difficulty HEENT: normal Neck: supple. Thick neck, JVD not well visualized . Carotids 2+ bilat; no bruits. No lymphadenopathy or thyromegaly appreciated. Cor: PMI nondisplaced. Regular rate & rhythm. No rubs, gallops or murmurs. Lungs: decreased BS at the bases  Abdomen: obese, + ventral hernia tender to touch  Extremities: no cyanosis, clubbing, rash, trace b/l LE edema Neuro: alert & orientedx3, cranial nerves grossly intact. moves all 4 extremities w/o difficulty. Affect pleasant   Telemetry   NSR 90s, personally reviewed   EKG    NSR 85 bpm, no ischemic abnormalities   Labs   Basic Metabolic Panel: Recent Labs  Lab 09/01/23 0733 09/02/23 1808 09/03/23 0505  NA 139 139 138  K 3.0* 3.3* 3.1*  CL 100 101 102  CO2 29 25 26   GLUCOSE 116* 86 88  BUN 14 10 9   CREATININE 1.13 1.05 0.96  CALCIUM 9.4 9.1 8.5*  MG  --  2.0 2.0    Liver Function Tests: Recent Labs  Lab 09/01/23 0733 09/02/23 1808 09/03/23 0505  AST 10* 18 14*  ALT 11 17 17   ALKPHOS 63 55 48  BILITOT 0.3 0.5 0.6  PROT 7.3 7.3 5.8*  ALBUMIN 4.2 3.7 3.0*   Recent Labs  Lab 09/01/23 0733  LIPASE 49   No results for input(s): "AMMONIA" in the last 168 hours.  CBC: Recent Labs  Lab 09/01/23 0733 09/02/23 1808 09/03/23 0505  WBC 11.4* 11.7* 9.0  NEUTROABS 8.6* 8.9*  --   HGB 16.4 16.6 15.0  HCT 51.7 51.2 46.7  MCV 80.5 80.9 81.4  PLT 318 338 280    Cardiac Enzymes: No results for input(s): "CKTOTAL", "CKMB", "CKMBINDEX", "TROPONINI" in the last 168 hours.  BNP: BNP (last 3 results) Recent Labs    06/25/23 0952 07/13/23 1000 08/06/23 1153  BNP 281.4* 241.1* 124.6*    ProBNP (last 3 results) No results for input(s):  "PROBNP" in the last 8760 hours.   CBG: No results for input(s): "GLUCAP" in the last 168 hours.  Coagulation Studies: Recent Labs    09/03/23 0505  LABPROT 13.5  INR 1.0     Imaging   No results found.   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 AHF team consulted for cardiac risk assessment.   Assessment/Plan   1. Complicated Vental Hernia - incarcerated, tender ventral hernia containing intestine, the appendix and fatty tissue  - high risk for strangulation if not repaired - repair planned per GS   2. Chronic Systolic HF/ Cardiac Risk Assessment  - diagnosed 9/21 Echo EF 20-25% RV moderately HK - suspect HTN vs PVC-mediated (PVC burden 4.3% - probably not high enough to cause CM) - cath 1/23 no CAD EF < 10% - CPX 2/23 pVO2: 21.6 (87% predicted peak VO2) corrected for ibw pVO2 41.6 ml/kg (ibw)/min (99% of the ibw-adjusted predicted). Slope:45 O2pulse:  19 (90% pred) - Echo 09/24/21: EF 20-25% RV moderately HK - Echo 2/24: EF 25-30%. RV moderately HK - Echo 11/24: EF 25%, RV moderately down - CPX test reassuring VO2 but slope high  - Chronically NYHA Class IIb symptoms, confounded by obesity/deconditioning. No ischemic CP + minimal coronary disease on cath in 2023 - Given severity of biventricular failure, he is high risk for complications but surgery not prohibitive. AHF team will follow closely post-operatively   - Agree w/ holding Sherryll Burger and Marcelline Deist during perioperative period   - Continue Toprol XL 25 mg daily  -  Continue digoxin 0.125 mg daily. -  Continue spiro 25 mg daily. -  Continue Ivabradine 7.5 mg bid. - Judicious use of IVF during perioperative period - continue torsemide 80 mg daily  - continuous telemetry monitoring  - Has been referred for ICD. Has f/u w/ Dr. Graciela Husbands in May. This should not delay hernia repair    3. HTN - controlled on current regimen    4. OSA, Severe - CPAP at bedtime    5. Hypokalemia - K 3.1 - give K supp, Mg ok at 2.0  - on spiro      Length of Stay: 1  Thaxton Pelley, PA-C  09/03/2023, 11:09 AM  Advanced Heart Failure Team Pager [phone] (M-F; 7a - 5p)  Please contact [facility] for night-coverage after hours (4p -7a ) and weekends on [email or web address]

## 2023-09-03 NOTE — Transfer of Care (Signed)
 Immediate Anesthesia Transfer of Care Note  Patient: Donald Murray  Procedure(s) Performed: REPAIR, HERNIA, RECURRENT UMBILICAL, ROBOT-ASSISTED  Patient Location: PACU  Anesthesia Type:General  Level of Consciousness: awake, alert , and oriented  Airway & Oxygen Therapy: Patient Spontanous Breathing and Patient connected to face mask oxygen  Post-op Assessment: Report given to RN and Post -op Vital signs reviewed and stable  Post vital signs: Reviewed and stable  Last Vitals:  Vitals Value Taken Time  BP 111/77 09/03/23 1745  Temp    Pulse 96 09/03/23 1748  Resp 35 09/03/23 1748  SpO2 90 % 09/03/23 1748  Vitals shown include unfiled device data.  Last Pain:  Vitals:   09/03/23 1343  TempSrc: Oral  PainSc:       Patients Stated Pain Goal: 3 (09/03/23 1037)  Complications: No notable events documented.

## 2023-09-03 NOTE — Telephone Encounter (Signed)
 Patient contacted for status update prior to procedure and reported that he was already inpatient and had been evaluated by the AHF team.  Patient will not require clearance and will be removed from the inbox today.

## 2023-09-03 NOTE — Anesthesia Procedure Notes (Addendum)
 Procedure Name: Intubation Date/Time: 09/03/2023 2:54 PM  Performed by: Sudie Grumbling, CRNAPre-anesthesia Checklist: Patient identified, Emergency Drugs available, Suction available and Patient being monitored Patient Re-evaluated:Patient Re-evaluated prior to induction Oxygen Delivery Method: Circle system utilized Preoxygenation: Pre-oxygenation with 100% oxygen Induction Type: IV induction Ventilation: Oral airway inserted - appropriate to patient size and Two handed mask ventilation required Laryngoscope Size: Mac and 4 Grade View: Grade I Tube type: Oral Tube size: 7.5 mm Number of attempts: 1 Airway Equipment and Method: Stylet and Oral airway Placement Confirmation: ETT inserted through vocal cords under direct vision, positive ETCO2 and breath sounds checked- equal and bilateral Secured at: 23 cm Tube secured with: Tape Dental Injury: Teeth and Oropharynx as per pre-operative assessment

## 2023-09-03 NOTE — Plan of Care (Signed)
  Problem: Health Behavior/Discharge Planning: Goal: Ability to manage health-related needs will improve Outcome: Progressing   Problem: Clinical Measurements: Goal: Will remain free from infection Outcome: Progressing Goal: Respiratory complications will improve Outcome: Progressing Goal: Cardiovascular complication will be avoided Outcome: Progressing   Problem: Activity: Goal: Risk for activity intolerance will decrease Outcome: Progressing   Problem: Pain Managment: Goal: General experience of comfort will improve and/or be controlled Outcome: Progressing   Problem: Safety: Goal: Ability to remain free from injury will improve Outcome: Progressing   Problem: Activity: Goal: Capacity to carry out activities will improve Outcome: Progressing   Problem: Cardiac: Goal: Ability to achieve and maintain adequate cardiopulmonary perfusion will improve Outcome: Progressing

## 2023-09-03 NOTE — Progress Notes (Signed)
 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 Dossie Der office 4/10 with incarcerated but not strangulated ventral hernia  Plan  Incarcerated ventral hernia Pain control moderate on oxycodone 1 every 4 as needed moderate, IV morphine in place 2 mg every 2 as needed severe ?  Going for surgery Periop risk calculated as 11% severe complication 17% for any complication-patient understands and accepts risk  Bimodal heart failure last EF 25% Appreciate cardiology weighing in-current meds digoxin 0.125 Corlanor 7.5 twice daily metoprolol XL 25 Aldactone 25 and torsemide resumed-volume restrictive strategy recommended given risk of heart failure Resume at discharge Entresto/Farxiga Outpatient interval evaluation for ICD as per cardiology planning  Moderate hypokalemia Increased K-Dur to 40 twice daily Magnesium checked is 2.0 so do not replace  Superobesity BMI 53 Life-threatening  Smoker Elevates risk postsurgery Unwilling to quit but counseled and given patch  DVT prophylaxis: Heparin  Status is: Inpatient Remains inpatient appropriate because:   Require surgery  Subjective: Awake coherent does not seem to be in pain or distress Is n.p.o. for procedure Rates most severe pain is 4/10  Objective + exam Vitals:   09/03/23 0016 09/03/23 0357 09/03/23 0400 09/03/23 0927  BP: 103/75  99/61 107/77  Pulse: 79 82 99 71   Resp: 20     Temp: 98 F (36.7 C)  98.1 F (36.7 C)   TempSrc: Oral  Oral   SpO2: 96% 97% 97%   Weight:      Height:       Filed Weights   09/02/23 1638  Weight: (!) 174.8 kg    Examination: EOMI NCAT thick neck cannot appreciate JVD multiple tattoos S1-S2 no murmur Chest is clear no wheeze ROM is intact   Data Reviewed: reviewed   CBC    Component Value Date/Time   WBC 9.0 09/03/2023 0505   RBC 5.74 09/03/2023 0505   HGB 15.0 09/03/2023 0505   HCT 46.7 09/03/2023 0505   PLT 280 09/03/2023 0505   MCV 81.4 09/03/2023 0505   MCH 26.1 09/03/2023 0505   MCHC 32.1 09/03/2023 0505   RDW 17.9 (H) 09/03/2023 0505   LYMPHSABS 1.9 09/02/2023 1808   MONOABS 0.6 09/02/2023 1808   EOSABS 0.2 09/02/2023 1808   BASOSABS 0.1 09/02/2023 1808      Latest Ref Rng & Units 09/03/2023    5:05 AM 09/02/2023    6:08 PM 09/01/2023    7:33 AM  CMP  Glucose 70 - 99 mg/dL 88  86  295   BUN 6 - 20 mg/dL 9  10  14    Creatinine 0.61 - 1.24 mg/dL 2.84  1.32  4.40   Sodium 135 - 145 mmol/L 138  139  139   Potassium 3.5 - 5.1 mmol/L 3.1  3.3  3.0   Chloride 98 - 111 mmol/L 102  101  100   CO2 22 - 32 mmol/L 26  25  29  Calcium 8.9 - 10.3 mg/dL 8.5  9.1  9.4   Total Protein 6.5 - 8.1 g/dL 5.8  7.3  7.3   Total Bilirubin 0.0 - 1.2 mg/dL 0.6  0.5  0.3   Alkaline Phos 38 - 126 U/L 48  55  63   AST 15 - 41 U/L 14  18  10    ALT 0 - 44 U/L 17  17  11      Scheduled Meds:  digoxin  0.125 mg Oral Daily   heparin  5,000 Units Subcutaneous Q8H   ivabradine  7.5 mg Oral BID WC   metoprolol succinate  25 mg Oral Daily   nicotine  21 mg Transdermal Daily   potassium chloride  40 mEq Oral BID   spironolactone  25 mg Oral Daily   torsemide  80 mg Oral Daily   Continuous Infusions:  Time 25  Rhetta Mura, MD  [facility]

## 2023-09-03 NOTE — Consult Note (Signed)
 Consulting Physician: Hyman Hopes Guyla Bless  Referring Provider: Dr. Mahala Menghini  Chief Complaint: Hernia  Reason for Consult: Hernia   Subjective   HPI: Donald Murray is an 38 y.o. male who is here for recurrent umbilical hernia repair.  I saw him in office yesterday.  His hernia was previously reducible but now tender and not reducible.  I'm worried about the hernia with incarcerated appendix and intestine becoming strangulated without necessarily causing obstructive symptoms first as it does involve the appendix.  He has a significant heart history with plans for a pacemaker so he was admitted to the medical team, preoperative cardiology evaluation was requested with plans for surgery this admission.  Past Medical History:  Diagnosis Date   Abdominal hernia    CHF (congestive heart failure) (HCC)    Hypertension    Obesity    Snoring     Past Surgical History:  Procedure Laterality Date   I & D EXTREMITY Right 03/04/2019   Procedure: IRRIGATION AND DEBRIDEMENT OF RIGHT ELBOW;  Surgeon: Dominica Severin, MD;  Location: [facility] OR;  Service: Orthopedics;  Laterality: Right;   IRRIGATION AND DEBRIDEMENT ABSCESS Left 03/04/2019   Procedure: Irrigation And Debridement Abscess Left hand;  Surgeon: Dominica Severin, MD;  Location: [facility] OR;  Service: Orthopedics;  Laterality: Left;   RIGHT/LEFT HEART CATH AND CORONARY ANGIOGRAPHY N/A 06/12/2021   Procedure: RIGHT/LEFT HEART CATH AND CORONARY ANGIOGRAPHY;  Surgeon: Dolores Patty, MD;  Location: [facility] INVASIVE CV LAB;  Service: Cardiovascular;  Laterality: N/A;   UMBILICAL HERNIA REPAIR      Family History  Problem Relation Age of Onset   Hypertension Mother    Hypertension Father     Social:  reports that he has been smoking cigarettes. He has a 16 pack-year smoking history. He has never used smokeless tobacco. He reports that he does not currently use alcohol. He reports that he does not currently use drugs.  Allergies: No Known  Allergies  Medications: Current Outpatient Medications  Medication Instructions   dapagliflozin propanediol (FARXIGA) 10 MG TABS tablet Take 1 tablet (10 mg) by mouth daily before breakfast.   digoxin (LANOXIN) 0.125 mg, Oral, Daily   Ibuprofen-Acetaminophen (ADVIL DUAL ACTION PO) 2 tablets, Daily PRN   ivabradine (CORLANOR) 7.5 MG TABS tablet Take 1 tablet by mouth 2 times daily with a meal.   metolazone (ZAROXOLYN) 2.5 mg, Oral, Daily PRN   metoprolol succinate (TOPROL XL) 25 mg, Oral, Daily   ondansetron (ZOFRAN-ODT) 4 MG disintegrating tablet Dissolve 1 tablet under the tongue every 8 (eight) hours as needed.   oxyCODONE (ROXICODONE) 5 mg, Oral, Every 6 hours PRN   potassium chloride SA (KLOR-CON M) 20 MEQ tablet 20 mEq, Oral, 2 times daily   sacubitril-valsartan (ENTRESTO) 97-103 MG 1 tablet, Oral, 2 times daily   spironolactone (ALDACTONE) 25 mg, Oral, Daily   torsemide (DEMADEX) 80 mg, Oral, Daily   Wegovy 1.7 mg, Subcutaneous, Weekly    ROS - all of the below systems have been reviewed with the patient and positives are indicated with bold text General: chills, fever or night sweats Eyes: blurry vision or double vision ENT: epistaxis or sore throat Allergy/Immunology: itchy/watery eyes or nasal congestion Hematologic/Lymphatic: bleeding problems, blood clots or swollen lymph nodes Endocrine: temperature intolerance or unexpected weight changes Breast: new or changing breast lumps or nipple discharge Resp: cough, shortness of breath, or wheezing CV: chest pain or dyspnea on exertion GI: as per HPI GU: dysuria, trouble voiding, or hematuria MSK: joint pain  or joint stiffness Neuro: TIA or stroke symptoms Derm: pruritus and skin lesion changes Psych: anxiety and depression  Objective   PE Blood pressure 99/61, pulse 99, temperature 98.1 F (36.7 C), temperature source Oral, resp. rate 20, height 5\' 11"  (1.803 m), weight (!) 174.8 kg, SpO2 97%. Constitutional: NAD;  conversant; no deformities Eyes: Moist conjunctiva; no lid lag; anicteric; PERRL Neck: Trachea midline; no thyromegaly Lungs: Normal respiratory effort; no tactile fremitus CV: RRR; no palpable thrills; no pitting edema GI: Abd Obese, tender periumbilical recurrent hernia; no palpable hepatosplenomegaly MSK: Normal range of motion of extremities; no clubbing/cyanosis Psychiatric: Appropriate affect; alert and oriented x3 Lymphatic: No palpable cervical or axillary lymphadenopathy  Results for orders placed or performed during the hospital encounter of 09/02/23 (from the past 24 hours)  Comprehensive metabolic panel     Status: Abnormal   Collection Time: 09/02/23  6:08 PM  Result Value Ref Range   Sodium 139 135 - 145 mmol/L   Potassium 3.3 (L) 3.5 - 5.1 mmol/L   Chloride 101 98 - 111 mmol/L   CO2 25 22 - 32 mmol/L   Glucose, Bld 86 70 - 99 mg/dL   BUN 10 6 - 20 mg/dL   Creatinine, Ser 1.61 0.61 - 1.24 mg/dL   Calcium 9.1 8.9 - 09.6 mg/dL   Total Protein 7.3 6.5 - 8.1 g/dL   Albumin 3.7 3.5 - 5.0 g/dL   AST 18 15 - 41 U/L   ALT 17 0 - 44 U/L   Alkaline Phosphatase 55 38 - 126 U/L   Total Bilirubin 0.5 0.0 - 1.2 mg/dL   GFR, Estimated >04 >54 mL/min   Anion gap 13 5 - 15  Magnesium     Status: None   Collection Time: 09/02/23  6:08 PM  Result Value Ref Range   Magnesium 2.0 1.7 - 2.4 mg/dL  CBC with Differential/Platelet     Status: Abnormal   Collection Time: 09/02/23  6:08 PM  Result Value Ref Range   WBC 11.7 (H) 4.0 - 10.5 K/uL   RBC 6.33 (H) 4.22 - 5.81 MIL/uL   Hemoglobin 16.6 13.0 - 17.0 g/dL   HCT 09.8 11.9 - 14.7 %   MCV 80.9 80.0 - 100.0 fL   MCH 26.2 26.0 - 34.0 pg   MCHC 32.4 30.0 - 36.0 g/dL   RDW 82.9 (H) 56.2 - 13.0 %   Platelets 338 150 - 400 K/uL   nRBC 0.0 0.0 - 0.2 %   Neutrophils Relative % 75 %   Neutro Abs 8.9 (H) 1.7 - 7.7 K/uL   Lymphocytes Relative 16 %   Lymphs Abs 1.9 0.7 - 4.0 K/uL   Monocytes Relative 5 %   Monocytes Absolute 0.6 0.1 -  1.0 K/uL   Eosinophils Relative 2 %   Eosinophils Absolute 0.2 0.0 - 0.5 K/uL   Basophils Relative 1 %   Basophils Absolute 0.1 0.0 - 0.1 K/uL   Immature Granulocytes 1 %   Abs Immature Granulocytes 0.12 (H) 0.00 - 0.07 K/uL  Comprehensive metabolic panel     Status: Abnormal   Collection Time: 09/03/23  5:05 AM  Result Value Ref Range   Sodium 138 135 - 145 mmol/L   Potassium 3.1 (L) 3.5 - 5.1 mmol/L   Chloride 102 98 - 111 mmol/L   CO2 26 22 - 32 mmol/L   Glucose, Bld 88 70 - 99 mg/dL   BUN 9 6 - 20 mg/dL   Creatinine, Ser 8.65 0.61 -  1.24 mg/dL   Calcium 8.5 (L) 8.9 - 10.3 mg/dL   Total Protein 5.8 (L) 6.5 - 8.1 g/dL   Albumin 3.0 (L) 3.5 - 5.0 g/dL   AST 14 (L) 15 - 41 U/L   ALT 17 0 - 44 U/L   Alkaline Phosphatase 48 38 - 126 U/L   Total Bilirubin 0.6 0.0 - 1.2 mg/dL   GFR, Estimated >16 >10 mL/min   Anion gap 10 5 - 15  CBC     Status: Abnormal   Collection Time: 09/03/23  5:05 AM  Result Value Ref Range   WBC 9.0 4.0 - 10.5 K/uL   RBC 5.74 4.22 - 5.81 MIL/uL   Hemoglobin 15.0 13.0 - 17.0 g/dL   HCT 96.0 45.4 - 09.8 %   MCV 81.4 80.0 - 100.0 fL   MCH 26.1 26.0 - 34.0 pg   MCHC 32.1 30.0 - 36.0 g/dL   RDW 11.9 (H) 14.7 - 82.9 %   Platelets 280 150 - 400 K/uL   nRBC 0.0 0.0 - 0.2 %  Protime-INR     Status: None   Collection Time: 09/03/23  5:05 AM  Result Value Ref Range   Prothrombin Time 13.5 11.4 - 15.2 seconds   INR 1.0 0.8 - 1.2  Magnesium     Status: None   Collection Time: 09/03/23  5:05 AM  Result Value Ref Range   Magnesium 2.0 1.7 - 2.4 mg/dL    Imaging Orders  No imaging studies ordered today   Complex ventral hernia containing terminal ileum and appendix. Appearance is stable since prior study. No evidence of bowel obstruction.   Hepatic steatosis.   No acute findings.     Assessment and Plan   Donald Murray is an 38 y.o. male with an incarcerated, tender ventral hernia containing intestine, the appendix and fatty tissue.  I'm concerned  he may progress to strangulation without obstructive symptoms due to the involvement of the appendix.  I recommend robotic incisional hernia repair, possible posterior rectus myofascial release, possible transversus abdominis myofascial release and possible mesh placement.  We discussed hernia repair plan may change based on intra-operative findings in order to avoid mesh infection complications.  We discussed the procedure, its risks, benefits and alternatives. After a full discussion and all questions answered, the patient granted consent to proceed.  Prior to surgery, a cardiology evaluation needs to be completed.  He has been told previously that he may need a pacemaker.  I would like to get him to surgery as soon as possible, possibly later today, but need cardiology evaluation completed prior to making plans for surgery.  Quentin Ore, MD General, Bariatric and Minimally Invasive Surgery [facility] Surgery - [facility]

## 2023-09-04 ENCOUNTER — Other Ambulatory Visit (HOSPITAL_COMMUNITY): Payer: Self-pay

## 2023-09-04 DIAGNOSIS — K436 Other and unspecified ventral hernia with obstruction, without gangrene: Secondary | ICD-10-CM | POA: Diagnosis not present

## 2023-09-04 DIAGNOSIS — I5042 Chronic combined systolic (congestive) and diastolic (congestive) heart failure: Secondary | ICD-10-CM

## 2023-09-04 LAB — CBC WITH DIFFERENTIAL/PLATELET
Abs Immature Granulocytes: 0.14 10*3/uL — ABNORMAL HIGH (ref 0.00–0.07)
Basophils Absolute: 0 10*3/uL (ref 0.0–0.1)
Basophils Relative: 0 %
Eosinophils Absolute: 0 10*3/uL (ref 0.0–0.5)
Eosinophils Relative: 0 %
HCT: 49 % (ref 39.0–52.0)
Hemoglobin: 15.8 g/dL (ref 13.0–17.0)
Immature Granulocytes: 1 %
Lymphocytes Relative: 6 %
Lymphs Abs: 1.1 10*3/uL (ref 0.7–4.0)
MCH: 26.2 pg (ref 26.0–34.0)
MCHC: 32.2 g/dL (ref 30.0–36.0)
MCV: 81.1 fL (ref 80.0–100.0)
Monocytes Absolute: 0.7 10*3/uL (ref 0.1–1.0)
Monocytes Relative: 4 %
Neutro Abs: 15.4 10*3/uL — ABNORMAL HIGH (ref 1.7–7.7)
Neutrophils Relative %: 89 %
Platelets: 356 10*3/uL (ref 150–400)
RBC: 6.04 MIL/uL — ABNORMAL HIGH (ref 4.22–5.81)
RDW: 18.4 % — ABNORMAL HIGH (ref 11.5–15.5)
WBC: 17.3 10*3/uL — ABNORMAL HIGH (ref 4.0–10.5)
nRBC: 0 % (ref 0.0–0.2)

## 2023-09-04 LAB — BASIC METABOLIC PANEL WITH GFR
Anion gap: 12 (ref 5–15)
BUN: 14 mg/dL (ref 6–20)
CO2: 25 mmol/L (ref 22–32)
Calcium: 8.6 mg/dL — ABNORMAL LOW (ref 8.9–10.3)
Chloride: 99 mmol/L (ref 98–111)
Creatinine, Ser: 1.27 mg/dL — ABNORMAL HIGH (ref 0.61–1.24)
GFR, Estimated: >60 mL/min (ref >60)
Glucose, Bld: 133 mg/dL — ABNORMAL HIGH (ref 70–99)
Potassium: 4.1 mmol/L (ref 3.5–5.1)
Sodium: 136 mmol/L (ref 135–145)

## 2023-09-04 MED ORDER — SPIRONOLACTONE 25 MG PO TABS: 25.0000 mg | ORAL_TABLET | Freq: Every day | ORAL | Status: DC

## 2023-09-04 MED ORDER — OXYCODONE HCL 5 MG PO TABS
5.0000 mg | ORAL_TABLET | Freq: Four times a day (QID) | ORAL | 0 refills | Status: DC | PRN
  Filled 2023-09-04: qty 40, 10d supply, fill #0

## 2023-09-04 MED ORDER — TORSEMIDE 20 MG PO TABS: 80.0000 mg | ORAL_TABLET | Freq: Every day | ORAL | Status: DC

## 2023-09-04 NOTE — Discharge Instructions (Signed)

## 2023-09-04 NOTE — Progress Notes (Signed)
 DISCHARGE NOTE HOME Jud Fanguy to be discharged Home per MD order. Discussed prescriptions and follow up appointments with the patient. Prescriptions given to patient; medication list explained in detail. Patient verbalized understanding.  Skin clean, dry and intact without evidence of skin break down, no evidence of skin tears noted. IV catheter discontinued intact. Site without signs and symptoms of complications. Dressing and pressure applied. Pt denies pain at the site currently. No complaints noted.  See LDA for abdominal incisions from surgery  An After Visit Summary (AVS) was printed and given to the patient.  Taken to discharge lounge  to wait to be  escorted via wheelchair, and discharged home via private auto.  Tonda Francisco, RN

## 2023-09-04 NOTE — Discharge Summary (Signed)
 Physician Discharge Summary  Donald Murray [name] DOB: [birth year] DOA: 09/02/2023  PCP: Patient, [name]  Admit date: 09/02/2023 Discharge date: 09/04/2023  Time spent: 44 minutes  Recommendations for Outpatient Follow-up:  Get CBC Chem-12 magnesium in about 1 week Need [facility] cardiology visit as an outpatient-CC Dr. McLean/Bensimhon No dosage adjustments to meds-holding Entresto for now resuming diuretics a.m. 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 intake Cardiology saw him and recommended adjustments to meds-I have held his  Viola Greulich He will resume his Aldactone and torsemide a.m./13 metolazone has been discontinued He will continue Corlanor 7.5 twice daily digoxin 0.125 etc. 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   Smoker Elevates risk postsurgery Unwilling to quit but counseled and given patch  Discharge Exam: Vitals:   09/04/23 0902 09/04/23 0925  BP:  99/73  Pulse: (!) 105 94  Resp:  16  Temp:  98.4 F (36.9 C)  SpO2:  98%    Subj on day of d/c   Coherent awake alert pleasant eating and drinking Pain is moderately controlled No chest pain No fever No chills EOMI NCAT no focal deficit CTAB Postop wounds look well-healed ROM is intact Neuro is intact power 5/5  Discharge Instructions   Discharge Instructions     Diet - low sodium heart healthy   Complete by: As directed    Discharge instructions   Complete by: As directed    You were admitted to the hospital with a hernia and will need close outpatient follow-up with your cardiologist who have adjusted your meds a little bit Check your blood pressure regularly make sure that you are not too dizzy and if you are holding diuretics (Aldactone and torsemide)-resume both Aldactone and torsemide on 4/13 For pain control we will prescribe Tylenol round-the-clock over-the-counter and I will give you a small prescription of oxycodone to take home please follow-up with general surgery please follow the postop instructions and see them in the office For high-grade fever chills nausea vomiting chest pain please present to the urgent care or emergency room for severe  nausea vomiting Do not take metolazone until you see cardiology in the outpatient   Increase activity slowly   Complete by: As directed       Allergies as of 09/04/2023   No Known Allergies       Medication List     STOP taking these medications    ADVIL DUAL ACTION PO   Entresto 97-103 MG Generic drug: sacubitril-valsartan   Farxiga 10 MG Tabs tablet Generic drug: dapagliflozin propanediol   metolazone 2.5 MG tablet Commonly known as: ZAROXOLYN       TAKE these medications    digoxin 0.125 MG tablet Commonly known as: LANOXIN Take 1 tablet (0.125 mg total) by mouth daily.   ivabradine 7.5 MG Tabs tablet Commonly known as: Corlanor Take 1 tablet by mouth 2 times daily with a meal.   metoprolol succinate 25 MG 24 hr tablet Commonly known as: Toprol XL Take 1 tablet (25 mg total) by mouth daily.   ondansetron 4 MG disintegrating tablet Commonly known as: ZOFRAN-ODT Dissolve 1 tablet under the tongue every 8 (eight) hours as needed.   oxyCODONE 5 MG immediate release tablet Commonly known as: Roxicodone Take 1 tablet (5 mg total) by mouth every 6 (six) hours as needed.   potassium chloride SA 20 MEQ tablet Commonly known as: KLOR-CON M Take 1 tablet (20 mEq total) by mouth 2 (two) times daily.   spironolactone 25 MG tablet Commonly known as: ALDACTONE Take 1 tablet (25 mg total) by mouth daily. Start taking on: September 05, 2023   torsemide 20 MG tablet Commonly known as: DEMADEX Take 4 tablets (80 mg total) by mouth daily. Start taking on: September 05, 2023   Wegovy 1.7 MG/0.75ML Soaj Generic drug: Semaglutide-Weight Management Inject 1.7 mg into the skin once a week. What changed: when to take this       No Known Allergies  Follow-up Information     Stechschulte, Avon Boers, MD. Schedule an appointment as soon as possible for a visit in 3 week(s).   Specialty: Surgery Contact information: [address] [place] [address] [phone]                  The results of significant diagnostics from this hospitalization (including imaging, microbiology, ancillary and laboratory) are listed below for reference.     Significant Diagnostic Studies: CT ABDOMEN PELVIS W CONTRAST Result Date: 09/01/2023 CLINICAL DATA:  Abdominal pain, acute, nonlocalized hernias EXAM: CT ABDOMEN AND PELVIS WITH CONTRAST TECHNIQUE: Multidetector CT imaging of the abdomen and pelvis was performed using the standard protocol following bolus administration of intravenous contrast. RADIATION DOSE REDUCTION: This exam was performed according to the departmental dose-optimization program which includes automated exposure control, adjustment of the mA and/or kV according to patient size and/or use of iterative reconstruction technique. CONTRAST:  OMNIPAQUE IOHEXOL 300 MG/ML  SOLN COMPARISON:  08/23/2023 FINDINGS: Lower chest: No acute abnormality Hepatobiliary: Diffuse low-density throughout the liver compatible with fatty infiltration. No focal abnormality. Gallbladder unremarkable. Pancreas: No focal abnormality or ductal dilatation. Spleen: No focal abnormality.  Normal size. Adrenals/Urinary Tract: No adrenal abnormality. No focal renal abnormality. No stones or hydronephrosis. Urinary bladder is unremarkable. Stomach/Bowel: Few scattered sigmoid diverticula. No evidence of bowel obstruction or inflammatory process. Terminal ileum herniates into a left abdominal ventral hernia. Cecum appears to now be within abdominal cavity. Herniation of the normal appendix into a right ventral hernia. Vascular/Lymphatic: No evidence of aneurysm or adenopathy. Reproductive: No visible focal abnormality.  Other: No free fluid or free air. Musculoskeletal: No acute bony abnormality. 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 Appearance is stable since prior study. No evidence of bowel obstruction. Hepatic steatosis. No acute findings. Electronically Signed   By: Janeece Mechanic M.D.   On: 09/01/2023 10:49   CT ABDOMEN PELVIS W CONTRAST Result Date: 08/23/2023 CLINICAL DATA:  Acute nonlocalized abdominal pain. Intermittent flank  pain with chills. Diarrhea. EXAM: CT ABDOMEN AND PELVIS WITH CONTRAST TECHNIQUE: Multidetector CT imaging of the abdomen and pelvis was performed using the standard protocol following bolus administration of intravenous contrast. RADIATION DOSE REDUCTION: This exam was performed according to the departmental dose-optimization program which includes automated exposure control, adjustment of the mA and/or kV according to patient size and/or use of iterative reconstruction technique. CONTRAST:  OMNIPAQUE IOHEXOL 300 MG/ML  SOLN COMPARISON:  04/15/2014 CT abdomen pelvis FINDINGS: Lower chest: No acute abnormality. Hepatobiliary: Hepatic steatosis. Unremarkable gallbladder and biliary tree. Pancreas: Unremarkable. Spleen: Unremarkable. Adrenals/Urinary Tract: Normal adrenal glands. No urinary calculi or hydronephrosis. Unremarkable bladder. Stomach/Bowel: Normal caliber large and small bowel. Mobile cecum located in the midline anterior abdomen. Multiple fat containing ventral abdominal wall hernias on both sides midline. The proximal ascending colon and terminal ileum herniate into left-sided ventral hernias. The appendix herniates into a right ventral hernia. No evidence of appendicitis. No bowel wall thickening or hypoenhancement. Vascular/Lymphatic: No significant vascular findings are present. No enlarged abdominal or pelvic lymph nodes. Reproductive: Unremarkable. Other: At least 3 low left paramidline and what right paramidline ventral abdominal wall hernias containing fat and mild stranding. Herniation of the appendix into the right paramidline ventral hernia. Herniation of the proximal ascending colon and terminal ileum into the left-sided hernias. Musculoskeletal: No acute fracture IMPRESSION: 1. Multiple fat containing ventral abdominal wall hernias on both sides of midline. There is stranding about the fat in the hernias. Recommend clinical correlation for strangulation/incarceration. 2. Herniation of  the appendix into a right paramidline ventral hernia without evidence of appendicitis. 3. Herniation of the proximal ascending colon and terminal ileum into left-sided ventral hernias. No evidence of bowel obstruction or ischemia. 4. Hepatic steatosis. Electronically Signed   By: Rozell Cornet M.D.   On: 08/23/2023 03:09    Microbiology: No results found for this or any previous visit (from the past 240 hours).   Labs: Basic Metabolic Panel: Recent Labs  Lab 09/01/23 0733 09/02/23 1808 09/03/23 0505 09/04/23 0607  NA 139 139 138 136  K 3.0* 3.3* 3.1* 4.1  CL 100 101 102 99  CO2 29 25 26 25   GLUCOSE 116* 86 88 133*  BUN 14 10 9 14   CREATININE 1.13 1.05 0.96 1.27*  CALCIUM 9.4 9.1 8.5* 8.6*  MG  --  2.0 2.0  --    Liver Function Tests: Recent Labs  Lab 09/01/23 0733 09/02/23 1808 09/03/23 0505  AST 10* 18 14*  ALT 11 17 17   ALKPHOS 63 55 48  BILITOT 0.3 0.5 0.6  PROT 7.3 7.3 5.8*  ALBUMIN 4.2 3.7 3.0*   Recent Labs  Lab 09/01/23 0733  LIPASE 49   No results for input(s): "AMMONIA" in the last 168 hours. CBC: Recent Labs  Lab 09/01/23 0733 09/02/23 1808 09/03/23 0505 09/04/23 0607  WBC 11.4* 11.7* 9.0 17.3*  NEUTROABS 8.6* 8.9*  --  15.4*  HGB 16.4 16.6 15.0 15.8  HCT 51.7 51.2 46.7 49.0  MCV 80.5 80.9 81.4 81.1  PLT 318 338 280 356   Cardiac Enzymes:  No results for input(s): "CKTOTAL", "CKMB", "CKMBINDEX", "TROPONINI" in the last 168 hours. BNP: BNP (last 3 results) Recent Labs    06/25/23 0952 07/13/23 1000 08/06/23 1153  BNP 281.4* 241.1* 124.6*    ProBNP (last 3 results) No results for input(s): "PROBNP" in the last 8760 hours.  CBG: No results for input(s): "GLUCAP" in the last 168 hours.  Signed:  Verlie Glisson MD   [facility] 09/04/2023, 10:29 AM

## 2023-09-04 NOTE — Progress Notes (Signed)
 1 Day Post-Op   Subjective/Chief Complaint: Patient hungry.  Pain controlled   Objective: Vital signs in last 24 hours: Temp:  [97.7 F (36.5 C)-98.1 F (36.7 C)] 97.8 F (36.6 C) (04/12 0538) Pulse Rate:  [71-93] 88 (04/12 0538) Resp:  [18-26] 20 (04/12 0538) BP: (99-118)/(70-98) 99/70 (04/12 0538) SpO2:  [90 %-98 %] 98 % (04/12 0538) Last BM Date : 09/01/23  Intake/Output from previous day: 04/11 0701 - 04/12 0700 In: 1237 [P.O.:237; I.V.:1000] Out: 750 [Urine:720; Blood:30] Intake/Output this shift: No intake/output data recorded.  Abdomen: Incisions clean dry intact.  Obese.  Appropriately tender.  Lab Results:  Recent Labs    09/03/23 0505 09/04/23 0607  WBC 9.0 17.3*  HGB 15.0 15.8  HCT 46.7 49.0  PLT 280 356   BMET Recent Labs    09/03/23 0505 09/04/23 0607  NA 138 136  K 3.1* 4.1  CL 102 99  CO2 26 25  GLUCOSE 88 133*  BUN 9 14  CREATININE 0.96 1.27*  CALCIUM 8.5* 8.6*   PT/INR Recent Labs    09/03/23 0505  LABPROT 13.5  INR 1.0   ABG No results for input(s): "PHART", "HCO3" in the last 72 hours.  Invalid input(s): "PCO2", "PO2"  Studies/Results: No results found.  Anti-infectives: Anti-infectives (From admission, onward)    Start     Dose/Rate Route Frequency Ordered Stop   09/03/23 1430  ceFAZolin (ANCEF) IVPB 3g/150 mL premix  Status:  Discontinued        3 g 300 mL/hr over 30 Minutes Intravenous Every 8 hours 09/03/23 1341 09/03/23 1341   09/03/23 1430  ceFAZolin (ANCEF) IVPB 3g/150 mL premix        3 g 300 mL/hr over 30 Minutes Intravenous  Once 09/03/23 1341 09/03/23 1500   09/03/23 1359  ceFAZolin (ANCEF) 3-4 GM/150ML-% IVPB       Note to Pharmacy: Noelle Batten M: cabinet override      09/03/23 1359 09/03/23 1509       Assessment/Plan: s/p Procedure(s): REPAIR, HERNIA, RECURRENT UMBILICAL, ROBOT-ASSISTED (N/A) Doing well  Advance diet  Okay for discharge from a surgical standpoint once medically  cleared  Follow-up information and postop structures in chart  LOS: 2 days    Rodrigo Clara MD 09/04/2023

## 2023-09-04 NOTE — Progress Notes (Signed)
 Rounding Note    Patient Name: Donald Murray Date of Encounter: 09/04/2023  [facility] Cardiologist: Alexandria Angel, MD . Bensimhon   Subjective   38 year old gentleman with history of chronic combined systolic and diastolic congestive heart failure, hypertension, IV drug use, cigarette smoking.  He has a EF of around 20%.  He has been followed regularly by Dr. Julane Ny.  He was here for repair of a hiatal hernia.  He is done well following surgery.  He has been able to eat.  Blood pressure is a little low but I suspect this is because he is missed several meals while he is here.  He is not having any shortness of breath. Wounds look good.  He has been cleared to be discharged from a surgical standpoint.  Inpatient Medications    Scheduled Meds:  digoxin  0.125 mg Oral Daily   heparin  5,000 Units Subcutaneous Q8H   ivabradine  7.5 mg Oral BID WC   metoprolol succinate  25 mg Oral Daily   nicotine  21 mg Transdermal Daily   potassium chloride  40 mEq Oral BID   spironolactone  25 mg Oral Daily   torsemide  80 mg Oral Daily   Continuous Infusions:  PRN Meds: HYDROmorphone (DILAUDID) injection, methocarbamol (ROBAXIN) injection, morphine injection, oxyCODONE-acetaminophen   Vital Signs    Vitals:   09/03/23 2047 09/04/23 0538 09/04/23 0902 09/04/23 0925  BP:  99/70  99/73  Pulse:  88 (!) 105 94  Resp:  20  16  Temp: 97.7 F (36.5 C) 97.8 F (36.6 C)  98.4 F (36.9 C)  TempSrc: Oral Oral  Oral  SpO2:  98%  98%  Weight:      Height:        Intake/Output Summary (Last 24 hours) at 09/04/2023 1010 Last data filed at 09/04/2023 0600 Gross per 24 hour  Intake 1237 ml  Output 750 ml  Net 487 ml      09/02/2023    4:38 PM 09/01/2023    7:36 AM 08/22/2023   10:34 PM  Last 3 Weights  Weight (lbs) 385 lb 4.8 oz 384 lb 384 lb  Weight (kg) 174.771 kg 174.181 kg 174.181 kg      Telemetry    NSR  - Personally Reviewed  ECG     - Personally  Reviewed  Physical Exam   GEN: Morbidly obese, young gentleman, no acute distress Neck: No JVD Cardiac: RRR, no murmurs, rubs, or gallops.  Respiratory: Clear to auscultation bilaterally. GI: Soft, nontender, non-distended  MS: No edema; No deformity. Neuro:  Nonfocal  Psych: Normal affect   Labs    High Sensitivity Troponin:  No results for input(s): "TROPONINIHS" in the last 720 hours.   Chemistry Recent Labs  Lab 09/01/23 0733 09/02/23 1808 09/03/23 0505 09/04/23 0607  NA 139 139 138 136  K 3.0* 3.3* 3.1* 4.1  CL 100 101 102 99  CO2 29 25 26 25   GLUCOSE 116* 86 88 133*  BUN 14 10 9 14   CREATININE 1.13 1.05 0.96 1.27*  CALCIUM 9.4 9.1 8.5* 8.6*  MG  --  2.0 2.0  --   PROT 7.3 7.3 5.8*  --   ALBUMIN 4.2 3.7 3.0*  --   AST 10* 18 14*  --   ALT 11 17 17   --   ALKPHOS 63 55 48  --   BILITOT 0.3 0.5 0.6  --   GFRNONAA >60 >60 >60 >60  ANIONGAP 10  13 10 12     Lipids No results for input(s): "CHOL", "TRIG", "HDL", "LABVLDL", "LDLCALC", "CHOLHDL" in the last 168 hours.  Hematology Recent Labs  Lab 09/02/23 1808 09/03/23 0505 09/04/23 0607  WBC 11.7* 9.0 17.3*  RBC 6.33* 5.74 6.04*  HGB 16.6 15.0 15.8  HCT 51.2 46.7 49.0  MCV 80.9 81.4 81.1  MCH 26.2 26.1 26.2  MCHC 32.4 32.1 32.2  RDW 18.4* 17.9* 18.4*  PLT 338 280 356   Thyroid No results for input(s): "TSH", "FREET4" in the last 168 hours.  BNPNo results for input(s): "BNP", "PROBNP" in the last 168 hours.  DDimer No results for input(s): "DDIMER" in the last 168 hours.   Radiology    No results found.  Cardiac Studies     Patient Profile     38 y.o. male   Assessment & Plan    1.  Chronic combined systolic and diastolic congestive heart failure: He seems to be very stable following his surgery.  Blood pressure is a little bit low but I suspect it is because he missed several meals yesterday.  He is now eating normally.   Will hold spironolactone and torsemide this am   Will DC him home  on   Corlanor 7.5,  toprol xl 25 every day,  Torsemide 80 mg every day ( start tomorrow) ,  Spiro 25 mg every day ( start tomorrow)  Kdur 40 bid ( start tomorrow)   He stable to be discharged from a cardiac standpoint.  He that he has follow-up in the CHF clinic in June.  Will try to get him in for a sooner appointment.  [facility] will sign off.   Medication Recommendations:   Other recommendations (labs, testing, etc):   Follow up as an outpatient:    For questions or updates, please contact [facility] Please consult [email or web address] for contact info under        Signed, Ahmad Alert, MD  09/04/2023, 10:10 AM

## 2023-09-04 NOTE — Plan of Care (Signed)
  Problem: Clinical Measurements: Goal: Will remain free from infection Outcome: Progressing Goal: Respiratory complications will improve Outcome: Progressing Goal: Cardiovascular complication will be avoided Outcome: Progressing   Problem: Activity: Goal: Risk for activity intolerance will decrease Outcome: Progressing   Problem: Pain Managment: Goal: General experience of comfort will improve and/or be controlled Outcome: Progressing   Problem: Safety: Goal: Ability to remain free from injury will improve Outcome: Progressing   Problem: Skin Integrity: Goal: Risk for impaired skin integrity will decrease Outcome: Progressing   Problem: Activity: Goal: Capacity to carry out activities will improve Outcome: Progressing   Problem: Cardiac: Goal: Ability to achieve and maintain adequate cardiopulmonary perfusion will improve Outcome: Progressing

## 2023-09-06 NOTE — Anesthesia Postprocedure Evaluation (Signed)
 Anesthesia Post Note  Patient: Donald Murray  Procedure(s) Performed: REPAIR, HERNIA, RECURRENT UMBILICAL, ROBOT-ASSISTED     Anesthetic complications: no   No notable events documented.  Last Vitals:  Vitals:   09/04/23 0925 09/04/23 1100  BP: 99/73 125/84  Pulse: 94 96  Resp: 16   Temp: 36.9 C   SpO2: 98%     Last Pain:  Vitals:   09/04/23 1100  TempSrc:   PainSc: 4      Pt discharged to home 09/04/2023 with no apparent anesthetic complications.             Halana Deisher,E. Azelea Seguin

## 2023-09-08 ENCOUNTER — Telehealth (HOSPITAL_COMMUNITY): Payer: Self-pay | Admitting: Internal Medicine

## 2023-09-08 NOTE — Telephone Encounter (Signed)
 Front office called patient on mobile phone to schedule a f/u appt with the [facility] Tuesday at 3:30 PM per Dr. Andria Keeler nurse Sherolyn Dixon, RN. Instructed patient to call front office back to get scheduled for a hosp f/u appt.

## 2023-09-13 ENCOUNTER — Telehealth (HOSPITAL_COMMUNITY): Payer: Self-pay

## 2023-09-13 ENCOUNTER — Other Ambulatory Visit (HOSPITAL_BASED_OUTPATIENT_CLINIC_OR_DEPARTMENT_OTHER): Payer: Self-pay

## 2023-09-13 NOTE — Telephone Encounter (Signed)
 Called to confirm/remind patient of their appointment at the [facility] on  09/14/2023 1:30.   Appointment:   [x] Confirmed  [] Left mess   [] No answer/No voice mail  [] VM Full/unable to leave message  [] Phone not in service  Patient reminded to bring all medications and/or complete list.  Confirmed patient has transportation. Gave directions, instructed to utilize valet parking.

## 2023-09-14 ENCOUNTER — Other Ambulatory Visit (HOSPITAL_COMMUNITY): Payer: Self-pay

## 2023-09-14 ENCOUNTER — Other Ambulatory Visit (HOSPITAL_BASED_OUTPATIENT_CLINIC_OR_DEPARTMENT_OTHER): Payer: Self-pay

## 2023-09-14 ENCOUNTER — Encounter (HOSPITAL_COMMUNITY): Payer: Self-pay

## 2023-09-14 ENCOUNTER — Ambulatory Visit (HOSPITAL_COMMUNITY)
Admission: RE | Admit: 2023-09-14 | Discharge: 2023-09-14 | Disposition: A | Discharge status: Home / Self Care | Payer: MEDICAID | Source: Ambulatory Visit | Attending: Cardiology | Admitting: Cardiology

## 2023-09-14 VITALS — BP 136/98 | HR 107 | Wt 388.6 lb

## 2023-09-14 DIAGNOSIS — Z716 Tobacco abuse counseling: Secondary | ICD-10-CM | POA: Diagnosis not present

## 2023-09-14 DIAGNOSIS — I5022 Chronic systolic (congestive) heart failure: Secondary | ICD-10-CM | POA: Insufficient documentation

## 2023-09-14 DIAGNOSIS — G4733 Obstructive sleep apnea (adult) (pediatric): Secondary | ICD-10-CM | POA: Diagnosis not present

## 2023-09-14 DIAGNOSIS — R000 Tachycardia, unspecified: Secondary | ICD-10-CM | POA: Diagnosis not present

## 2023-09-14 DIAGNOSIS — Z79899 Other long term (current) drug therapy: Secondary | ICD-10-CM | POA: Insufficient documentation

## 2023-09-14 DIAGNOSIS — M545 Low back pain, unspecified: Secondary | ICD-10-CM | POA: Diagnosis not present

## 2023-09-14 DIAGNOSIS — F17210 Nicotine dependence, cigarettes, uncomplicated: Secondary | ICD-10-CM | POA: Insufficient documentation

## 2023-09-14 DIAGNOSIS — Z6841 Body Mass Index (BMI) 40.0 and over, adult: Secondary | ICD-10-CM | POA: Insufficient documentation

## 2023-09-14 DIAGNOSIS — K439 Ventral hernia without obstruction or gangrene: Secondary | ICD-10-CM

## 2023-09-14 DIAGNOSIS — I10 Essential (primary) hypertension: Secondary | ICD-10-CM | POA: Diagnosis not present

## 2023-09-14 DIAGNOSIS — I110 Hypertensive heart disease with heart failure: Secondary | ICD-10-CM | POA: Insufficient documentation

## 2023-09-14 DIAGNOSIS — I493 Ventricular premature depolarization: Secondary | ICD-10-CM | POA: Insufficient documentation

## 2023-09-14 DIAGNOSIS — K760 Fatty (change of) liver, not elsewhere classified: Secondary | ICD-10-CM | POA: Insufficient documentation

## 2023-09-14 DIAGNOSIS — Z720 Tobacco use: Secondary | ICD-10-CM

## 2023-09-14 LAB — CBC
HCT: 48.4 % (ref 39.0–52.0)
Hemoglobin: 15 g/dL (ref 13.0–17.0)
MCH: 26.2 pg (ref 26.0–34.0)
MCHC: 31 g/dL (ref 30.0–36.0)
MCV: 84.5 fL (ref 80.0–100.0)
Platelets: 342 10*3/uL (ref 150–400)
RBC: 5.73 MIL/uL (ref 4.22–5.81)
RDW: 17.7 % — ABNORMAL HIGH (ref 11.5–15.5)
WBC: 10.7 10*3/uL — ABNORMAL HIGH (ref 4.0–10.5)
nRBC: 0 % (ref 0.0–0.2)

## 2023-09-14 LAB — COMPREHENSIVE METABOLIC PANEL WITH GFR
ALT: 15 U/L (ref 0–44)
AST: 19 U/L (ref 15–41)
Albumin: 3.2 g/dL — ABNORMAL LOW (ref 3.5–5.0)
Alkaline Phosphatase: 57 U/L (ref 38–126)
Anion gap: 9 (ref 5–15)
BUN: 9 mg/dL (ref 6–20)
CO2: 24 mmol/L (ref 22–32)
Calcium: 8.9 mg/dL (ref 8.9–10.3)
Chloride: 108 mmol/L (ref 98–111)
Creatinine, Ser: 0.99 mg/dL (ref 0.61–1.24)
GFR, Estimated: >60 mL/min (ref >60)
Glucose, Bld: 87 mg/dL (ref 70–99)
Potassium: 4.6 mmol/L (ref 3.5–5.1)
Sodium: 141 mmol/L (ref 135–145)
Total Bilirubin: 0.8 mg/dL (ref 0.0–1.2)
Total Protein: 6.7 g/dL (ref 6.5–8.1)

## 2023-09-14 LAB — BRAIN NATRIURETIC PEPTIDE: B Natriuretic Peptide: 346.4 pg/mL — ABNORMAL HIGH (ref 0.0–100.0)

## 2023-09-14 MED ORDER — WEGOVY 2.4 MG/0.75ML ~~LOC~~ SOAJ
2.4000 mg | SUBCUTANEOUS | 5 refills | Status: DC
  Filled 2023-09-14: qty 3, 28d supply, fill #0
  Filled 2023-10-23: qty 3, 28d supply, fill #1
  Filled 2023-11-27: qty 3, 28d supply, fill #2
  Filled 2023-12-22 – 2024-01-11 (×2): qty 3, 28d supply, fill #3
  Filled 2024-02-05: qty 3, 28d supply, fill #4
  Filled 2024-03-16: qty 3, 28d supply, fill #5

## 2023-09-14 NOTE — Progress Notes (Addendum)
 ADVANCED HF CLINIC NOTE  Primary Care: Patient, [name] Primary Cardiologist: Alexandria Angel, MD HF Cardiologist: Dr. Julane Ny  Reason for Visit: Heart Failure Follow-up HPI: Donald Murray is a morbidly obese 38 y.o. male with HTN, previous IVDA (heroin), tobacco use and systolic HF (onset 9/21).  Admitted to [facility] 9/21 with new onset HF in setting of severe HTN 174/128. ECG with sinus tach and frequent PVCs. Echo EF 20-25% Moderate RV dysfunction.   HF Consultation for the first time 12/21. 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 Zio placed to quantify PVC burden - 4.3%.  Admitted 12/22 with ADHF. Echo LVEF <20% w/ global HK, no visible thrombus, RV okay, mild MR.  LHC 1/23 with normal cors. EF < 10% Elevated filling pressures (PCWP 28) and low output (CI 1.5). Lasix  increased to 80 bid.  Sleep study 2/23: Severe OSA, moderate central sleep apnea. Waiting for CPAP machine, reports he was told there is a backorder on supplies.  Seen by Dr. Rodolfo Clan in 7/23 and planning ICD. But hasn't been done due to scheduling issues.   Echo 11/24: EF 25%, RV moderately down  06/24/23. Seen in HF clinic. Volume overloaded.  Given 80 mg Furoscix . He wore on body infuser 4 hours then he accidentally pulled off. He reports lots of urine output.   Admitted 09/02/23 for incarcerated ventral hernia s/p repair. Course uncomplicated.   He returns today for heart failure follow up. Overall feeling poor. NYHA III. Reports  pain in lower back, states that it is the same pain that initially brought him to the ED with prior to ventral hernia repair, says this does not feel musculoskeletal. Does not have a follow up appointment with GI.  Denies chest pain, dyspnea, fatigue, near-syncope, orthopnea, palpitations, dizziness, and abnormal bleeding. Able to perform ADLs. Appetite okay. Weight continues to climb despite WeGovy  use, close to maximally titrated. Reports compliant with all medications, however medication  dispense report with <50% coverage for all medications.   Cardiac Studies: - Echo 11/24: EF 25%, RV moderately down - Echo 2/24: EF 25-30%. RV moderately HK - Echo 09/24/21: EF 20-25% RV moderately HK - Echo 10/20: EF 60-65%   - CPX test 2/23 FVC 5.12  (92%)      FEV1 4.34  (96%)        FEV1/FVC 85   (103%)        MVV 164  (93%)   Resting HR: 126 Standing HR:125 Peak HR: 175   (95% age predicted max HR)  BP rest (standing): 92/70 BP peak: 148/72  Peak VO2: 21.6 (87% predicted peak VO2)  When adjusted to the patient's ideal body weight of 180.3 lb (81.8 kg) the peak VO2 is 41.6 ml/kg (ibw)/min (99% of the ibw-adjusted predicted).  VE/VCO2 slope:  45  VE/MVV:  92%  O2pulse:  19 (90% predicted O2pulse)   - RHC 1/23: RA 9, PA 66/38 (50), PCWP 28, CO/CI (fick) 3.9/1.5, PVR 5.7, PAPi 3.1  - Zio 12/21 Sinus - average HR 104 4.3% PVCs  Past Medical History:  Diagnosis Date   Abdominal hernia    CHF (congestive heart failure) (HCC)    Hypertension    Obesity    Snoring    Current Outpatient Medications  Medication Sig Dispense Refill   digoxin  (LANOXIN ) 0.125 MG tablet Take 1 tablet (0.125 mg total) by mouth daily. 90 tablet 3   ivabradine  (CORLANOR ) 7.5 MG TABS tablet Take 1 tablet by mouth 2 times daily with a meal. 60 tablet  11   metoprolol  succinate (TOPROL  XL) 25 MG 24 hr tablet Take 1 tablet (25 mg total) by mouth daily. 90 tablet 3   ondansetron  (ZOFRAN -ODT) 4 MG disintegrating tablet Dissolve 1 tablet under the tongue every 8 (eight) hours as needed. 20 tablet 0   oxyCODONE  (ROXICODONE ) 5 MG immediate release tablet Take 1 tablet (5 mg total) by mouth every 6 (six) hours as needed. 40 tablet 0   Semaglutide -Weight Management (WEGOVY ) 1.7 MG/0.75ML SOAJ Inject 1.7 mg into the skin once a week. (Patient taking differently: Inject 1.7 mg into the skin every Monday.) 3 mL 0   spironolactone  (ALDACTONE ) 25 MG tablet Take 1 tablet (25 mg total) by mouth daily.     torsemide   (DEMADEX ) 20 MG tablet Take 4 tablets (80 mg total) by mouth daily.     potassium chloride  SA (KLOR-CON  M) 20 MEQ tablet Take 1 tablet (20 mEq total) by mouth 2 (two) times daily. (Patient not taking: Reported on 09/14/2023) 6 tablet 0   No current facility-administered medications for this encounter.   No Known Allergies  Social History   Socioeconomic History   Marital status: Single    Spouse name: Not on file   Number of children: Not on file   Years of education: Not on file   Highest education level: Not on file  Occupational History   Not on file  Tobacco Use   Smoking status: Every Day    Current packs/day: 1.00    Average packs/day: 1 pack/day for 16.0 years (16.0 ttl pk-yrs)    Types: Cigarettes   Smokeless tobacco: Never  Vaping Use   Vaping status: Some Days   Substances: Nicotine   Substance and Sexual Activity   Alcohol use: Not Currently    Comment: Pt stated "2 years clean"   Drug use: Not Currently    Comment: Pt stated "It was opiates"   Sexual activity: Not on file  Other Topics Concern   Not on file  Social History Narrative   Not on file   Social Drivers of Health   Financial Resource Strain: High Risk (04/08/2022)   Overall Financial Resource Strain (CARDIA)    Difficulty of Paying Living Expenses: Very hard  Food Insecurity: No Food Insecurity (09/02/2023)   Hunger Vital Sign    Worried About Running Out of Food in the Last Year: Never true    Ran Out of Food in the Last Year: Never true  Transportation Needs: No Transportation Needs (09/02/2023)   PRAPARE - Administrator, Civil Service (Medical): No    Lack of Transportation (Non-Medical): No  Physical Activity: Not on file  Stress: Not on file  Social Connections: Not on file  Intimate Partner Violence: Not At Risk (09/02/2023)   Humiliation, Afraid, Rape, and Kick questionnaire    Fear of Current or Ex-Partner: No    Emotionally Abused: No    Physically Abused: No    Sexually  Abused: No   Family History  Problem Relation Age of Onset   Hypertension Mother    Hypertension Father     BP (!) 136/98   Pulse (!) 107   Wt (!) 176.3 kg (388 lb 9.6 oz)   SpO2 97%   BMI 54.20 kg/m   Wt Readings from Last 3 Encounters:  09/14/23 (!) 176.3 kg (388 lb 9.6 oz)  09/02/23 (!) 174.8 kg (385 lb 4.8 oz)  09/01/23 (!) 174.2 kg (384 lb)    PHYSICAL EXAM: General: Well appearing.  Walked into clinic. Cardiac: JVP difficult to assess due to body habitus. S1 and S2 present. No murmurs or rub. Abdomen: Soft, non-tender, distended.  Extremities: Warm and dry.  Trace BLE edema.  Neuro: Alert and oriented x3. Affect pleasant. Moves all extremities without difficulty.  ECG (personally reviewed): ST 101 bpm  ASSESSMENT & PLAN:  1. Chronic Systolic HF - Echo 9/21 EF 20-25% RV moderately HK. Suspect HTN vs PVC-mediated (PVC burden 4.3% - probably not high enough to cause CM) - LHC 1/23 no CAD EF < 10% - CPX 2/23 pVO2: 21.6 (87% predicted peak VO2) corrected for ibw pVO2 41.6 ml/kg (ibw)/min (99% of the ibw-adjusted predicted). Slope:45 O2pulse:  19 (90% pred) - Echo 5/23, 2/24, and 11/24: EF 20-25% RV moderately HK - CPX test reassuring VO2 but slope high  - NYHA IIb-III, compounded by body habitus and back pain. Volume okay. - Continue Toprol  XL to 25 mg daily. - Continue Torsemide  80 mg daily + 40 KCL daily.  - Continue Digoxin  0.125 mg daily. - Continue Spiro 25 mg daily. - Continue Ivabradine  7.5 mg bid - Will need to resume Farxiga  once seen for surgical/GI follow up. Having significant abdominal pain.  - Continue PRN metolazone  2.5 mg + 40 KCL, has not needed recently. Has PRN Furoscix  at home.  - Continue to follow for need for advanced therapies. We discussed the potential for VAD again but weight is getting higher. - CMET/BNP today - he is undecided about ICD. Has an upcoming appointment with Dr. Rodolfo Clan to discuss.  2. HTN - BP slgihtly elevated, has not taken  medications this am - Continue meds as above  3. Sinus Tach  - Rate in low 100s today  - Continue Toprol  XL to 25 mg daily. - Continue Ivabradine .  - Continue digoxin  0.125 mg daily.   4.  H/O Frequent PVCs - Zio 12/21 4.3% PVCs - probably not high enough burden to cause CM  - Has severe OSA.   5.  Morbid obesity - Body mass index is 54.2 kg/m. - On WeGovy , has not had meaningful weight loss. Weight actually has continued to climb. 35 lbs up in 2 years.  - Consider switch to tirzepatide.  6. OSA, Severe - Not on CPAP yet.  - Cannot afford. Engage HFSW for resources.  7.  Tobacco use - Smoking 1/2 ppd - Discussed smoking cessation - Offered patches, he declines  8. H/O IVDA (heroin) - Last used 2020  - No change  9. Back pain - recent ventral hernias involving strangulated appendix and colon s/p repair, states that pain has not improved  - does not currently have PCP or GI/General surgery follow up - refer to PCP and GI for further evaluation post hernia repair.  - CMET today; CT scan with hepatic steatosis  Follow up with Dr. Julane Ny in 3 months  [place] Andersen Iorio, NP 1:34 PM

## 2023-09-14 NOTE — Patient Instructions (Signed)
 Great to see you today!!!  Medication Changes:  None, continue current medications  Lab Work:  Labs done today, your results will be available in MyChart, we will contact you for abnormal readings.  Referrals:  You have been referred to [facility], they will call you for an appointment  Special Instructions // Education:  Do the following things EVERYDAY: Weigh yourself in the morning before breakfast. Write it down and keep it in a log. Take your medicines as prescribed Eat low salt foods--Limit salt (sodium) to 2000 mg per day.  Stay as active as you can everyday Limit all fluids for the day to less than 2 liters   Follow-Up in: 2 months with Dr Bensimhon as scheduled   At the [facility], you and your health needs are our priority. We have a designated team specialized in the treatment of Heart Failure. This Care Team includes your primary Heart Failure Specialized Cardiologist (physician), Advanced Practice Providers (APPs- Physician Assistants and Nurse Practitioners), and Pharmacist who all work together to provide you with the care you need, when you need it.   You may see any of the following providers on your designated Care Team at your next follow up:  Dr. Jules Oar Dr. Peder Bourdon Dr. Alwin Baars Dr. Judyth Nunnery Nieves Bars, NP Ruddy Corral, [place] [facility] [place], [place] Dennise Fitz, NP [place] Lee, NP Luster Salters, PharmD   Please be sure to bring in all your medications bottles to every appointment.   Need to Contact Us :  If you have any questions or concerns before your next appointment please send us  a message through [place] or call our office at [phone].    TO LEAVE A MESSAGE FOR THE NURSE SELECT OPTION 2, PLEASE LEAVE A MESSAGE INCLUDING: YOUR NAME DATE OF BIRTH CALL BACK NUMBER REASON FOR CALL**this is important as we prioritize the call backs  YOU WILL RECEIVE A CALL BACK THE SAME DAY AS LONG AS  YOU CALL BEFORE 4:00 PM

## 2023-09-14 NOTE — Addendum Note (Signed)
 Addended by: Sunny English on: 09/14/2023 05:09 PM   Modules accepted: Orders

## 2023-09-19 ENCOUNTER — Other Ambulatory Visit: Payer: Self-pay

## 2023-09-19 ENCOUNTER — Encounter (HOSPITAL_BASED_OUTPATIENT_CLINIC_OR_DEPARTMENT_OTHER): Payer: Self-pay | Admitting: Emergency Medicine

## 2023-09-19 ENCOUNTER — Emergency Department (HOSPITAL_BASED_OUTPATIENT_CLINIC_OR_DEPARTMENT_OTHER)
Admission: EM | Admit: 2023-09-19 | Discharge: 2023-09-19 | Disposition: A | Discharge status: Home / Self Care | Payer: MEDICAID | Attending: Emergency Medicine | Admitting: Emergency Medicine

## 2023-09-19 ENCOUNTER — Emergency Department (HOSPITAL_BASED_OUTPATIENT_CLINIC_OR_DEPARTMENT_OTHER): Payer: MEDICAID

## 2023-09-19 DIAGNOSIS — R109 Unspecified abdominal pain: Secondary | ICD-10-CM | POA: Diagnosis not present

## 2023-09-19 DIAGNOSIS — G8918 Other acute postprocedural pain: Secondary | ICD-10-CM | POA: Insufficient documentation

## 2023-09-19 LAB — URINALYSIS, ROUTINE W REFLEX MICROSCOPIC
Bacteria, UA: NONE SEEN
Bilirubin Urine: NEGATIVE
Glucose, UA: NEGATIVE mg/dL
Ketones, ur: NEGATIVE mg/dL
Leukocytes,Ua: NEGATIVE
Nitrite: NEGATIVE
Protein, ur: 30 mg/dL — AB
Specific Gravity, Urine: 1.029 (ref 1.005–1.030)
pH: 6 (ref 5.0–8.0)

## 2023-09-19 LAB — COMPREHENSIVE METABOLIC PANEL WITH GFR
ALT: 16 U/L (ref 0–44)
AST: 18 U/L (ref 15–41)
Albumin: 4.4 g/dL (ref 3.5–5.0)
Alkaline Phosphatase: 92 U/L (ref 38–126)
Anion gap: 14 (ref 5–15)
BUN: 15 mg/dL (ref 6–20)
CO2: 26 mmol/L (ref 22–32)
Calcium: 9.8 mg/dL (ref 8.9–10.3)
Chloride: 100 mmol/L (ref 98–111)
Creatinine, Ser: 1.12 mg/dL (ref 0.61–1.24)
GFR, Estimated: >60 mL/min (ref >60)
Glucose, Bld: 93 mg/dL (ref 70–99)
Potassium: 3.7 mmol/L (ref 3.5–5.1)
Sodium: 141 mmol/L (ref 135–145)
Total Bilirubin: 0.6 mg/dL (ref 0.0–1.2)
Total Protein: 7.6 g/dL (ref 6.5–8.1)

## 2023-09-19 LAB — CBC
HCT: 51.1 % (ref 39.0–52.0)
Hemoglobin: 16.4 g/dL (ref 13.0–17.0)
MCH: 25.8 pg — ABNORMAL LOW (ref 26.0–34.0)
MCHC: 32.1 g/dL (ref 30.0–36.0)
MCV: 80.3 fL (ref 80.0–100.0)
Platelets: 378 K/uL (ref 150–400)
RBC: 6.36 MIL/uL — ABNORMAL HIGH (ref 4.22–5.81)
RDW: 17.9 % — ABNORMAL HIGH (ref 11.5–15.5)
WBC: 11.1 K/uL — ABNORMAL HIGH (ref 4.0–10.5)
nRBC: 0 % (ref 0.0–0.2)

## 2023-09-19 LAB — LIPASE, BLOOD: Lipase: 36 U/L (ref 11–51)

## 2023-09-19 MED ORDER — HYDROMORPHONE HCL 1 MG/ML IJ SOLN
1.0000 mg | Freq: Once | INTRAMUSCULAR | Status: AC
  Administered 2023-09-19: 1 mg via INTRAVENOUS
  Filled 2023-09-19: qty 1

## 2023-09-19 MED ORDER — CEPHALEXIN 500 MG PO CAPS: 500.0000 mg | ORAL_CAPSULE | Freq: Four times a day (QID) | ORAL | 0 refills | Status: AC

## 2023-09-19 MED ORDER — NAPROXEN 375 MG PO TABS: 375.0000 mg | ORAL_TABLET | Freq: Two times a day (BID) | ORAL | 0 refills | Status: DC

## 2023-09-19 MED ORDER — OXYCODONE-ACETAMINOPHEN 5-325 MG PO TABS
1.0000 | ORAL_TABLET | Freq: Three times a day (TID) | ORAL | 0 refills | Status: DC | PRN
  Filled 2023-09-19: qty 6, 2d supply, fill #0

## 2023-09-19 MED ORDER — OXYCODONE-ACETAMINOPHEN 5-325 MG PO TABS: 1.0000 | ORAL_TABLET | Freq: Three times a day (TID) | ORAL | 0 refills | Status: DC | PRN

## 2023-09-19 MED ORDER — CEPHALEXIN 500 MG PO CAPS
500.0000 mg | ORAL_CAPSULE | Freq: Four times a day (QID) | ORAL | 0 refills | Status: DC
  Filled 2023-09-19: qty 20, 5d supply, fill #0

## 2023-09-19 MED ORDER — OXYCODONE-ACETAMINOPHEN 5-325 MG PO TABS
1.0000 | ORAL_TABLET | Freq: Once | ORAL | Status: AC
  Administered 2023-09-19: 1 via ORAL
  Filled 2023-09-19: qty 1

## 2023-09-19 MED ORDER — ACETAMINOPHEN 500 MG PO TABS: 500.0000 mg | ORAL_TABLET | Freq: Four times a day (QID) | ORAL | 0 refills | Status: DC | PRN

## 2023-09-19 MED ORDER — ACETAMINOPHEN 500 MG PO TABS
500.0000 mg | ORAL_TABLET | Freq: Four times a day (QID) | ORAL | 0 refills | Status: DC | PRN
  Filled 2023-09-19: qty 30, 8d supply, fill #0

## 2023-09-19 MED ORDER — IOHEXOL 300 MG/ML  SOLN
100.0000 mL | Freq: Once | INTRAMUSCULAR | Status: AC | PRN
  Administered 2023-09-19: 100 mL via INTRAVENOUS

## 2023-09-19 MED ORDER — NAPROXEN 375 MG PO TABS
375.0000 mg | ORAL_TABLET | Freq: Two times a day (BID) | ORAL | 0 refills | Status: DC
  Filled 2023-09-19: qty 10, 5d supply, fill #0

## 2023-09-19 NOTE — ED Triage Notes (Signed)
 Pt caox4, ambulatory c/o generalized abd pain,  hernia repair 2 wks ago, denies fever, has been having normal BM, denies urinary s/s. Tylenol  and motrin taken at 10a.

## 2023-09-19 NOTE — Discharge Instructions (Signed)
 It appears you may have a infection on your CT scan.  We are prescribing antibiotics.  Please take them for the full course.  You are also being prescribed some additional pain medications to bridge you until you are seen by your surgeon.  Please return if you develop fevers, chills, worsening pain, inability eat or drink due to nausea vomiting, stop having bowel movements or you develop any new or worsening symptoms that are concerning to you.

## 2023-09-19 NOTE — ED Provider Notes (Signed)
 [place] EMERGENCY DEPARTMENT AT [facility] Provider Note   CSN: [id number] Arrival date & time: 09/19/23  1117     History {Add pertinent medical, surgical, social history, OB history to HPI:1} Chief Complaint  Patient presents with   Post-op Problem    Donald Murray is a 38 y.o. male.  HPI    38 year old  Home Medications Prior to Admission medications   Medication Sig Start Date End Date Taking? Authorizing Provider  acetaminophen  (TYLENOL ) 500 MG tablet Take 1 tablet (500 mg total) by mouth every 6 (six) hours as needed. 09/19/23  Yes Armanii Urbanik, MD  naproxen (NAPROSYN) 375 MG tablet Take 1 tablet (375 mg total) by mouth 2 (two) times daily. 09/19/23  Yes Deatra Face, MD  oxyCODONE -acetaminophen  (PERCOCET/ROXICET) 5-325 MG tablet Take 1 tablet by mouth every 8 (eight) hours as needed for severe pain (pain score 7-10). 09/19/23  Yes Brenlynn Fake, MD  digoxin  (LANOXIN ) 0.125 MG tablet Take 1 tablet (0.125 mg total) by mouth daily. 11/02/22   Bensimhon, Rheta Celestine, MD  ivabradine  (CORLANOR ) 7.5 MG TABS tablet Take 1 tablet by mouth 2 times daily with a meal. 11/02/22   Bensimhon, Rheta Celestine, MD  metoprolol  succinate (TOPROL  XL) 25 MG 24 hr tablet Take 1 tablet (25 mg total) by mouth daily. 08/06/23   Milford, Arlice Bene, FNP  ondansetron  (ZOFRAN -ODT) 4 MG disintegrating tablet Dissolve 1 tablet under the tongue every 8 (eight) hours as needed. 09/01/23   Curatolo, Adam, DO  oxyCODONE  (ROXICODONE ) 5 MG immediate release tablet Take 1 tablet (5 mg total) by mouth every 6 (six) hours as needed. 09/04/23   Samtani, Jai-Gurmukh, MD  potassium chloride  SA (KLOR-CON  M) 20 MEQ tablet Take 1 tablet (20 mEq total) by mouth 2 (two) times daily. Patient not taking: Reported on 09/14/2023 09/01/23   Lowery Rue, DO  Semaglutide -Weight Management (WEGOVY ) 2.4 MG/0.75ML SOAJ Inject 2.4 mg into the skin once a week. 09/14/23   Lenise Quince, MD  spironolactone  (ALDACTONE ) 25 MG  tablet Take 1 tablet (25 mg total) by mouth daily. 09/05/23   Samtani, Jai-Gurmukh, MD  torsemide  (DEMADEX ) 20 MG tablet Take 4 tablets (80 mg total) by mouth daily. 09/05/23   Samtani, Jai-Gurmukh, MD      Allergies    Patient has no known allergies.    Review of Systems   Review of Systems  Physical Exam Updated Vital Signs BP 119/86 (BP Location: Right Arm)   Pulse (!) 57   Temp 97.7 F (36.5 C)   Resp 20   SpO2 99%  Physical Exam  ED Results / Procedures / Treatments   Labs (all labs ordered are listed, but only abnormal results are displayed) Labs Reviewed  CBC - Abnormal; Notable for the following components:      Result Value   WBC 11.1 (*)    RBC 6.36 (*)    MCH 25.8 (*)    RDW 17.9 (*)    All other components within normal limits  URINALYSIS, ROUTINE W REFLEX MICROSCOPIC - Abnormal; Notable for the following components:   Hgb urine dipstick TRACE (*)    Protein, ur 30 (*)    All other components within normal limits  LIPASE, BLOOD  COMPREHENSIVE METABOLIC PANEL WITH GFR    EKG None  Radiology No results found.  Procedures Procedures  {Document cardiac monitor, telemetry assessment procedure when appropriate:1}  Medications Ordered in ED Medications  HYDROmorphone  (DILAUDID ) injection 1 mg (1 mg Intravenous Given 09/19/23 1438)  iohexol  (OMNIPAQUE ) 300 MG/ML solution 100 mL (100 mLs Intravenous Contrast Given 09/19/23 1443)    ED Course/ Medical Decision Making/ A&P Clinical Course as of 09/19/23 1531  Sun Sep 19, 2023  1528 Signout; S/p hernia repair. H/o CHF. LLQ abd pain today. Pending CT scan. If negative f/u outpatient.  [TY]    Clinical Course User Index [TY] Rolinda Climes, DO   {   Click here for ABCD2, HEART and other calculatorsREFRESH Note before signing :1}                              Medical Decision Making Amount and/or Complexity of Data Reviewed Labs: ordered. Radiology: ordered.  Risk OTC drugs. Prescription drug  management.   This patient presents to the ED with chief complaint(s) of persistent abdominal pain with pertinent past medical history of acute severe CHF, ventral hernia s/p repair from about 2 weeks ago.The complaint involves an extensive differential diagnosis and also carries with it a high risk of complications and morbidity.    The differential diagnosis includes : Intra-abdominal hematoma, intra-abdominal abscess, postop pain, abdominal wall cellulitis. Clinically, patient does not have SBO.  The initial plan is to get basic labs.  Exam is limited given patient's elevated BMI.  Will get CT abdomen pelvis with contrast. If CT is negative, patient can follow-up outpatient with surgery team as planned.  Will give her 3 days of pain medication to bridge.   Additional history obtained: Additional history obtained from {additional history:26846} Records reviewed {records:26847}  Independent labs interpretation:  The following labs were independently interpreted: ***  Independent visualization and interpretation of imaging: - I independently visualized the following imaging with scope of interpretation limited to determining acute life threatening conditions related to emergency care: ***, which revealed ***  Treatment and Reassessment: ***  Consultation: - Consulted or discussed management/test interpretation with external professional: ***  Consideration for admission or further workup:  Social Determinants of health:  Final Clinical Impression(s) / ED Diagnoses Final diagnoses:  Acute post-operative pain    Rx / DC Orders ED Discharge Orders          Ordered    oxyCODONE -acetaminophen  (PERCOCET/ROXICET) 5-325 MG tablet  Every 8 hours PRN        09/19/23 1531    naproxen (NAPROSYN) 375 MG tablet  2 times daily        09/19/23 1531    acetaminophen  (TYLENOL ) 500 MG tablet  Every 6 hours PRN        09/19/23 1531

## 2023-09-19 NOTE — ED Provider Notes (Signed)
 Received signout; briefly 38 year old male recent hernia repair with abdominal pain.  CT scan with possible cellulitis and some ill-defined fluid collections.  Reviewed labs and vitals.  White count stable, no fever or tachycardia no other significant abnormalities.  Case discussed with on-call surgeon who agrees that CT scan findings are likely seromas and outpatient follow-up with Keflex.  Will discharge in stable condition.   Corley, Salomone, DO 09/19/23 1730

## 2023-09-20 ENCOUNTER — Other Ambulatory Visit (HOSPITAL_COMMUNITY): Payer: Self-pay

## 2023-09-22 ENCOUNTER — Other Ambulatory Visit (HOSPITAL_BASED_OUTPATIENT_CLINIC_OR_DEPARTMENT_OTHER): Payer: Self-pay

## 2023-09-22 ENCOUNTER — Other Ambulatory Visit (HOSPITAL_COMMUNITY): Payer: Self-pay

## 2023-09-22 MED ORDER — OXYCODONE-ACETAMINOPHEN 5-325 MG PO TABS
1.0000 | ORAL_TABLET | Freq: Four times a day (QID) | ORAL | 0 refills | Status: DC
Start: 2023-09-22 — End: 2023-10-21
  Filled 2023-09-22: qty 20, 5d supply, fill #0

## 2023-09-22 MED ORDER — METHOCARBAMOL 500 MG PO TABS
500.0000 mg | ORAL_TABLET | Freq: Four times a day (QID) | ORAL | 0 refills | Status: DC
Start: 2023-09-22 — End: 2023-10-21
  Filled 2023-09-22: qty 20, 5d supply, fill #0

## 2023-09-27 ENCOUNTER — Other Ambulatory Visit (HOSPITAL_BASED_OUTPATIENT_CLINIC_OR_DEPARTMENT_OTHER): Payer: Self-pay

## 2023-09-28 ENCOUNTER — Ambulatory Visit: Payer: MEDICAID | Admitting: Internal Medicine

## 2023-09-28 ENCOUNTER — Encounter (HOSPITAL_COMMUNITY): Payer: Self-pay

## 2023-09-28 ENCOUNTER — Other Ambulatory Visit (HOSPITAL_COMMUNITY): Payer: Self-pay

## 2023-09-29 ENCOUNTER — Ambulatory Visit: Payer: MEDICAID | Attending: Cardiology | Admitting: Cardiology

## 2023-09-30 ENCOUNTER — Encounter: Payer: Self-pay | Admitting: Cardiology

## 2023-10-13 ENCOUNTER — Other Ambulatory Visit: Payer: Self-pay

## 2023-10-13 ENCOUNTER — Emergency Department (HOSPITAL_BASED_OUTPATIENT_CLINIC_OR_DEPARTMENT_OTHER)
Admission: EM | Admit: 2023-10-13 | Discharge: 2023-10-14 | Disposition: A | Discharge status: Home / Self Care | Payer: MEDICAID | Attending: Emergency Medicine | Admitting: Emergency Medicine

## 2023-10-13 ENCOUNTER — Encounter (HOSPITAL_BASED_OUTPATIENT_CLINIC_OR_DEPARTMENT_OTHER): Payer: Self-pay

## 2023-10-13 ENCOUNTER — Emergency Department (HOSPITAL_BASED_OUTPATIENT_CLINIC_OR_DEPARTMENT_OTHER): Payer: MEDICAID

## 2023-10-13 DIAGNOSIS — R000 Tachycardia, unspecified: Secondary | ICD-10-CM | POA: Diagnosis not present

## 2023-10-13 DIAGNOSIS — R0789 Other chest pain: Secondary | ICD-10-CM | POA: Diagnosis not present

## 2023-10-13 DIAGNOSIS — I509 Heart failure, unspecified: Secondary | ICD-10-CM | POA: Insufficient documentation

## 2023-10-13 DIAGNOSIS — Y990 Civilian activity done for income or pay: Secondary | ICD-10-CM | POA: Diagnosis not present

## 2023-10-13 DIAGNOSIS — W228XXA Striking against or struck by other objects, initial encounter: Secondary | ICD-10-CM | POA: Diagnosis not present

## 2023-10-13 DIAGNOSIS — S301XXS Contusion of abdominal wall, sequela: Secondary | ICD-10-CM | POA: Diagnosis not present

## 2023-10-13 DIAGNOSIS — R69 Illness, unspecified: Secondary | ICD-10-CM

## 2023-10-13 DIAGNOSIS — S3991XS Unspecified injury of abdomen, sequela: Secondary | ICD-10-CM | POA: Diagnosis present

## 2023-10-13 LAB — CBC WITH DIFFERENTIAL/PLATELET
Abs Immature Granulocytes: 0.06 10*3/uL (ref 0.00–0.07)
Basophils Absolute: 0.1 10*3/uL (ref 0.0–0.1)
Basophils Relative: 1 %
Eosinophils Absolute: 0.2 10*3/uL (ref 0.0–0.5)
Eosinophils Relative: 2 %
HCT: 45.1 % (ref 39.0–52.0)
Hemoglobin: 14.5 g/dL (ref 13.0–17.0)
Immature Granulocytes: 1 %
Lymphocytes Relative: 20 %
Lymphs Abs: 2.1 10*3/uL (ref 0.7–4.0)
MCH: 26.3 pg (ref 26.0–34.0)
MCHC: 32.2 g/dL (ref 30.0–36.0)
MCV: 81.7 fL (ref 80.0–100.0)
Monocytes Absolute: 0.7 10*3/uL (ref 0.1–1.0)
Monocytes Relative: 7 %
Neutro Abs: 7.2 10*3/uL (ref 1.7–7.7)
Neutrophils Relative %: 69 %
Platelets: 340 10*3/uL (ref 150–400)
RBC: 5.52 MIL/uL (ref 4.22–5.81)
RDW: 16.8 % — ABNORMAL HIGH (ref 11.5–15.5)
WBC: 10.3 10*3/uL (ref 4.0–10.5)
nRBC: 0 % (ref 0.0–0.2)

## 2023-10-13 LAB — COMPREHENSIVE METABOLIC PANEL WITH GFR
ALT: 19 U/L (ref 0–44)
AST: 18 U/L (ref 15–41)
Albumin: 3.8 g/dL (ref 3.5–5.0)
Alkaline Phosphatase: 92 U/L (ref 38–126)
Anion gap: 12 (ref 5–15)
BUN: 13 mg/dL (ref 6–20)
CO2: 24 mmol/L (ref 22–32)
Calcium: 9.4 mg/dL (ref 8.9–10.3)
Chloride: 106 mmol/L (ref 98–111)
Creatinine, Ser: 1.11 mg/dL (ref 0.61–1.24)
GFR, Estimated: >60 mL/min (ref >60)
Glucose, Bld: 83 mg/dL (ref 70–99)
Potassium: 3.9 mmol/L (ref 3.5–5.1)
Sodium: 141 mmol/L (ref 135–145)
Total Bilirubin: 0.5 mg/dL (ref 0.0–1.2)
Total Protein: 6.4 g/dL — ABNORMAL LOW (ref 6.5–8.1)

## 2023-10-13 LAB — TROPONIN T, HIGH SENSITIVITY: Troponin T High Sensitivity: 51 ng/L — ABNORMAL HIGH (ref <19)

## 2023-10-13 LAB — LIPASE, BLOOD: Lipase: 20 U/L (ref 11–51)

## 2023-10-13 LAB — LACTIC ACID, PLASMA: Lactic Acid, Venous: 1.1 mmol/L (ref 0.5–1.9)

## 2023-10-13 MED ORDER — OXYCODONE-ACETAMINOPHEN 5-325 MG PO TABS
1.0000 | ORAL_TABLET | Freq: Once | ORAL | Status: AC
  Administered 2023-10-13: 1 via ORAL
  Filled 2023-10-13: qty 1

## 2023-10-13 MED ORDER — METOPROLOL SUCCINATE ER 25 MG PO TB24
25.0000 mg | ORAL_TABLET | Freq: Once | ORAL | Status: AC
  Administered 2023-10-13: 25 mg via ORAL
  Filled 2023-10-13: qty 1

## 2023-10-13 MED ORDER — OXYCODONE HCL 5 MG PO TABS: 5.0000 mg | ORAL_TABLET | ORAL | 0 refills | Status: DC | PRN

## 2023-10-13 MED ORDER — HYDROMORPHONE HCL 1 MG/ML IJ SOLN
1.0000 mg | Freq: Once | INTRAMUSCULAR | Status: AC
  Administered 2023-10-13: 1 mg via INTRAVENOUS
  Filled 2023-10-13 (×2): qty 1

## 2023-10-13 MED ORDER — METOPROLOL SUCCINATE ER 25 MG PO TB24: 12.5000 mg | ORAL_TABLET | Freq: Every day | ORAL | Status: DC

## 2023-10-13 MED ORDER — METOPROLOL SUCCINATE ER 25 MG PO TB24: 12.5000 mg | ORAL_TABLET | Freq: Once | ORAL | Status: DC

## 2023-10-13 NOTE — ED Triage Notes (Signed)
 Pt is 1 month s/p umbilical hernia repair. Yesterday he hit his abd against his desk. Today he c/o pain and a hardening of an incision area. Any movement makes it hurts worse. Pt has taken ibuprofen/tylenol  with no relief.

## 2023-10-13 NOTE — ED Provider Notes (Addendum)
 [place] EMERGENCY DEPARTMENT AT [facility] Provider Note   CSN: [id number] Arrival date & time: 10/13/23  1858     History  Chief Complaint  Patient presents with   Post-op Problem    Donald Murray is a 38 y.o. male.  HPI Patient reports he had a umbilical hernia repair about a month ago.  He had problems with pain after the procedure and took a course of antibiotics as well.  He reports that it seemed to be doing all right although continued to cause some discomfort.  He reports that yesterday at work he ended up getting his abdomen hit kind of hard against the edge of a desk and since then the pain has escalated quite a bit.  He reports the only comfortable position is actually sitting in an upright position.  It is worse with any kind of movement.  He has tried ibuprofen and Tylenol  with no relief.  Patient has significant history for heart failure with EF of 25%.  Patient reports that he did take his morning dose of metoprolol  and all morning medications.  He has not taken evening medications at this time.  Patient endorsed some chest pain when asked.  He reports he does have intermittent discomfort.  On monitor patient is tachycardic ranging from 105 at rest to 130 with some activity.  Patient reports that when he wears his Apple Watch his baseline heart rate is about 90 bpm    Home Medications Prior to Admission medications   Medication Sig Start Date End Date Taking? Authorizing Provider  oxyCODONE  (ROXICODONE ) 5 MG immediate release tablet Take 1 tablet (5 mg total) by mouth every 4 (four) hours as needed for severe pain (pain score 7-10). 10/13/23  Yes Abrham Maslowski, Bufford Carne, MD  acetaminophen  (TYLENOL ) 500 MG tablet Take 1 tablet (500 mg total) by mouth every 6 (six) hours as needed. 09/19/23   Rolinda Climes, DO  digoxin  (LANOXIN ) 0.125 MG tablet Take 1 tablet (0.125 mg total) by mouth daily. 11/02/22   Bensimhon, Rheta Celestine, MD  ivabradine  (CORLANOR ) 7.5 MG TABS tablet Take  1 tablet by mouth 2 times daily with a meal. 11/02/22   Bensimhon, Rheta Celestine, MD  methocarbamol  (ROBAXIN ) 500 MG tablet Take 1 tablet (500 mg total) by mouth 4 (four) times daily for 10 days 09/22/23     metoprolol  succinate (TOPROL  XL) 25 MG 24 hr tablet Take 1 tablet (25 mg total) by mouth daily. 08/06/23   Milford, Arlice Bene, FNP  naproxen  (NAPROSYN ) 375 MG tablet Take 1 tablet (375 mg total) by mouth 2 (two) times daily. 09/19/23   Rolinda Climes, DO  ondansetron  (ZOFRAN -ODT) 4 MG disintegrating tablet Dissolve 1 tablet under the tongue every 8 (eight) hours as needed. 09/01/23   Curatolo, Adam, DO  oxyCODONE  (ROXICODONE ) 5 MG immediate release tablet Take 1 tablet (5 mg total) by mouth every 6 (six) hours as needed. 09/04/23   Samtani, Jai-Gurmukh, MD  oxyCODONE -acetaminophen  (PERCOCET/ROXICET) 5-325 MG tablet Take 1 tablet by mouth every 8 (eight) hours as needed for severe pain (pain score 7-10). 09/19/23   Rolinda Climes, DO  oxyCODONE -acetaminophen  (PERCOCET/ROXICET) 5-325 MG tablet Take 1 tablet by mouth every 6 (six) hours as needed for Pain for up to 5 days 09/22/23     potassium chloride  SA (KLOR-CON  M) 20 MEQ tablet Take 1 tablet (20 mEq total) by mouth 2 (two) times daily. Patient not taking: Reported on 09/14/2023 09/01/23   Lowery Rue, DO  Semaglutide -Weight Management (  WEGOVY ) 2.4 MG/0.75ML SOAJ Inject 2.4 mg into the skin once a week. 09/14/23   Lenise Quince, MD  spironolactone  (ALDACTONE ) 25 MG tablet Take 1 tablet (25 mg total) by mouth daily. 09/05/23   Samtani, Jai-Gurmukh, MD  torsemide  (DEMADEX ) 20 MG tablet Take 4 tablets (80 mg total) by mouth daily. 09/05/23   Samtani, Jai-Gurmukh, MD      Allergies    Patient has no known allergies.    Review of Systems   Review of Systems  Physical Exam Updated Vital Signs BP (!) 134/94   Pulse (!) 105   Temp 97.7 F (36.5 C) (Oral)   Resp (!) 22   Ht 5\' 11"  (1.803 m)   Wt (!) 172.4 kg   SpO2 99%   BMI 53.00 kg/m  Physical  Exam Constitutional:      Comments: Patient is alert nontoxic.  No respiratory distress at rest.  Mental status clear.  HENT:     Mouth/Throat:     Pharynx: Oropharynx is clear.  Eyes:     Extraocular Movements: Extraocular movements intact.  Cardiovascular:     Rate and Rhythm: Regular rhythm. Tachycardia present.  Pulmonary:     Effort: Pulmonary effort is normal.     Breath sounds: Normal breath sounds.  Abdominal:     Comments: Patient does not have any erythema of the abdominal wall.  He has very well-healed trocar scars at the top of the abdomen.  No visible masses.  Patient does have significantly obese abdominal wall with significant adipose tissue.  To palpation there are some palpable firm subcutaneous areas superior to the umbilicus and to the left.  No crepitus.  The umbilicus is clean and dry without erythema or drainage.  Musculoskeletal:        General: Normal range of motion.  Skin:    General: Skin is warm and dry.  Neurological:     General: No focal deficit present.     Mental Status: He is oriented to person, place, and time.     Motor: No weakness.     Coordination: Coordination normal.     ED Results / Procedures / Treatments   Labs (all labs ordered are listed, but only abnormal results are displayed) Labs Reviewed  COMPREHENSIVE METABOLIC PANEL WITH GFR - Abnormal; Notable for the following components:      Result Value   Total Protein 6.4 (*)    All other components within normal limits  CBC WITH DIFFERENTIAL/PLATELET - Abnormal; Notable for the following components:   RDW 16.8 (*)    All other components within normal limits  TROPONIN T, HIGH SENSITIVITY - Abnormal; Notable for the following components:   Troponin T High Sensitivity 51 (*)    All other components within normal limits  LIPASE, BLOOD  LACTIC ACID, PLASMA  LACTIC ACID, PLASMA  TROPONIN T, HIGH SENSITIVITY    EKG EKG Interpretation Date/Time:  Wednesday Oct 13 2023 22:14:43  EDT Ventricular Rate:  106 PR Interval:  175 QRS Duration:  96 QT Interval:  338 QTC Calculation: 449 R Axis:   268  Text Interpretation: Sinus tachycardia no sig change from previous Confirmed by Wynetta Heckle [phone]) on 10/13/2023 11:09:37 PM  Radiology CT ABDOMEN PELVIS WO CONTRAST Result Date: 10/13/2023 CLINICAL DATA:  Umbilical hernia repair 1 month ago.  Pain. EXAM: CT ABDOMEN AND PELVIS WITHOUT CONTRAST TECHNIQUE: Multidetector CT imaging of the abdomen and pelvis was performed following the standard protocol without IV contrast. RADIATION DOSE REDUCTION:  This exam was performed according to the departmental dose-optimization program which includes automated exposure control, adjustment of the mA and/or kV according to patient size and/or use of iterative reconstruction technique. COMPARISON:  CT abdomen and pelvis 09/19/2023 FINDINGS: Lower chest: There are trace bilateral pleural effusions. The heart appears mildly enlarged. Hepatobiliary: The liver is mildly enlarged. No focal liver lesions are seen. Gallbladder and bile ducts are within normal limits. Pancreas: Unremarkable. No pancreatic ductal dilatation or surrounding inflammatory changes. Spleen: Normal in size without focal abnormality. Adrenals/Urinary Tract: Adrenal glands are unremarkable. Kidneys are normal, without renal calculi, focal lesion, or hydronephrosis. Bladder is unremarkable. Stomach/Bowel: Stomach is within normal limits. Appendix is not visualized. No evidence of bowel wall thickening, distention, or inflammatory changes. Scattered colonic diverticula are present. Vascular/Lymphatic: No significant vascular findings are present. No enlarged abdominal or pelvic lymph nodes. Reproductive: Prostate is unremarkable. Other: There are small fat containing bilateral inguinal hernias. There is no ascites. There is diffuse anterior subcutaneous body wall edema. Patient is status post ventral hernia repair. Intramuscular  abdominal wall fluid collection the level of the umbilicus measures 1.9 x 5.7 by 1.7 cm and appears similar to prior. Focal areas of in capsulated subcutaneous abdominal wall edema are again seen at the level of the mid abdomen. Left-sided area measures 4.6 x 8.5 cm. Right-sided area measures 6.5 x 3.6 cm. These 2 areas have minimally decreased in size. There is a stable small fluid collection just left of midline measures 2.1 x 2.9 cm. No new fluid collections are seen. No soft tissue gas. Musculoskeletal: No acute or significant osseous findings. IMPRESSION: 1. Status post ventral hernia repair. 2. Stable intramuscular abdominal wall fluid collection at the level of the umbilicus. 3. Minimal decrease in size of the 2 focal areas of in capsulated subcutaneous abdominal wall edema at the level of the mid abdomen. 4. Stable small subcutaneous fluid collection just left of midline. 5. Diffuse anterior subcutaneous body wall edema. Correlate clinically for cellulitis. No soft tissue and. 6. Trace bilateral pleural effusions. 7. Mild hepatomegaly. 8. Colonic diverticulosis. 9. Small fat containing bilateral inguinal hernias. Electronically Signed   By: Tyron Gallon M.D.   On: 10/13/2023 22:44    Procedures Procedures    Medications Ordered in ED Medications  HYDROmorphone  (DILAUDID ) injection 1 mg (1 mg Intravenous Given 10/13/23 2239)  metoprolol  succinate (TOPROL -XL) 24 hr tablet 25 mg (25 mg Oral Given 10/13/23 2324)  oxyCODONE -acetaminophen  (PERCOCET/ROXICET) 5-325 MG per tablet 1 tablet (1 tablet Oral Given 10/13/23 2325)    ED Course/ Medical Decision Making/ A&P Clinical Course as of 10/13/23 2338  Wed Oct 13, 2023  2335 Stable HO MP 22 YOM with a chronic medical history EF 20 Troponin 51 pending 2nd Ccx of abdominal pain s/p negative CT Serial Troponin and dispo if flat. [CC]  2336 Troponin's chronically elevated per prior heartcare notes [CC]    Clinical Course User Index [CC]  Onetha Bile, MD                                 Medical Decision Making Amount and/or Complexity of Data Reviewed Labs: ordered. Radiology: ordered.  Risk Prescription drug management.   Patient presents as outlined with complaint of abdominal pain.  He is a little over a month postoperative umbilical anterior abdominal wall hernia repair.  The patient has had some persistent fluid collections suggestive of seroma.  His current examination does not  show any erythema of the abdominal wall or clinical suggestion on exam.  Given significant increase in pain since yesterday with some risk of bleeding or recurrent hernia will proceed with CT scan.  On review of systems patient endorses some chest discomfort..  Patient does have high risk coronary history.  Will get basic ACS workup.  Troponin 51.  Lipase 20.  Comprehensive metabolic panel normal.  Lactic 1.1.  CBC 10.3 normal H&H normal differential.  CT abdomen interpreted by radiology and personally reviewed by myself shows subcutaneous fluid collections consistent with prior findings.  At this time CT scan appears consistent with fairly chronic, stable seromas or scar tissue developing.  Clinically patient does not show signs of cellulitis, there is no leukocytosis and no lactic acidosis.  I do not think empiric antibiotics are indicated at this time.  Patient has had problems with recurrent pain.  Will treat pain with oral Percocet.  Patient has a high risk cardiac history.  He is tachycardic but at this time I suspect that is due to pain and baseline tachycardia with patient describing resting heart rate typically around 90 while completely compliant with medications.  Will administer a dose of metoprolol .  First troponin is 51 but by review of prior values, it appears he has an elevated baseline troponin.  Will obtain second troponin and if flat patient will be appropriate for discharge.  I have reviewed the CT findings with the patient  and suggest that he continue to follow-up with general surgery to determine if any interventional treatment would have utility.         Final Clinical Impression(s) / ED Diagnoses Final diagnoses:  Abdominal wall seroma, sequela  Severe comorbid illness    Rx / DC Orders ED Discharge Orders          Ordered    oxyCODONE  (ROXICODONE ) 5 MG immediate release tablet  Every 4 hours PRN        10/13/23 2338              Wynetta Heckle, MD 10/13/23 1610    Wynetta Heckle, MD 10/28/23 1422

## 2023-10-13 NOTE — ED Provider Notes (Signed)
 Care of patient received from prior provider at 11:33 PM, please see their note for complete H/P and care plan.  Received handoff per ED course.  Clinical Course as of 10/14/23 0059  Wed Oct 13, 2023  2335 Stable HO MP 62 YOM with a chronic medical history EF 20 Troponin 51 pending 2nd Ccx of abdominal pain s/p negative CT Serial Troponin and dispo if flat. [CC]  2336 Troponin's chronically elevated per prior heartcare notes [CC]    Clinical Course User Index [CC] Onetha Bile, MD    Reassessment: Follow-up troponin flat.  Patient stable for outpatient care management.  Asymptomatic at time of reassessment.     Onetha Bile, MD 10/14/23 [phone]

## 2023-10-13 NOTE — Discharge Instructions (Signed)
 1.  Appears you are still having pain from seromas left from surgical repair of your abdominal wall hernia.  It is not unusual for a seroma to form after a surgery.  You can review more information about this in your discharge instructions.  Sometimes they will absorb and go away on their own over time and sometimes an additional procedure with drainage helps some go away.  You must discuss this with your surgeon to see what is appropriate for you. 2.  Return if you develop a fever, if the skin on your abdomen over this area becomes red and swollen or other concerning changes.

## 2023-10-14 LAB — TROPONIN T, HIGH SENSITIVITY: Troponin T High Sensitivity: 52 ng/L — ABNORMAL HIGH (ref <19)

## 2023-10-14 LAB — LACTIC ACID, PLASMA: Lactic Acid, Venous: 0.9 mmol/L (ref 0.5–1.9)

## 2023-10-19 ENCOUNTER — Other Ambulatory Visit (HOSPITAL_BASED_OUTPATIENT_CLINIC_OR_DEPARTMENT_OTHER): Payer: Self-pay

## 2023-10-21 ENCOUNTER — Emergency Department (HOSPITAL_COMMUNITY): Payer: MEDICAID

## 2023-10-21 ENCOUNTER — Other Ambulatory Visit: Payer: Self-pay

## 2023-10-21 ENCOUNTER — Inpatient Hospital Stay (HOSPITAL_COMMUNITY)
Admission: EM | Admit: 2023-10-21 | Discharge: 2023-10-23 | DRG: 291 | Discharge status: Against Medical Advice | Payer: MEDICAID | Attending: Family Medicine | Admitting: Family Medicine

## 2023-10-21 ENCOUNTER — Encounter (HOSPITAL_COMMUNITY): Payer: Self-pay

## 2023-10-21 DIAGNOSIS — I493 Ventricular premature depolarization: Secondary | ICD-10-CM | POA: Diagnosis present

## 2023-10-21 DIAGNOSIS — I10 Essential (primary) hypertension: Secondary | ICD-10-CM | POA: Diagnosis not present

## 2023-10-21 DIAGNOSIS — F1191 Opioid use, unspecified, in remission: Secondary | ICD-10-CM | POA: Diagnosis present

## 2023-10-21 DIAGNOSIS — I5084 End stage heart failure: Secondary | ICD-10-CM | POA: Diagnosis present

## 2023-10-21 DIAGNOSIS — F17210 Nicotine dependence, cigarettes, uncomplicated: Secondary | ICD-10-CM | POA: Diagnosis present

## 2023-10-21 DIAGNOSIS — R079 Chest pain, unspecified: Secondary | ICD-10-CM | POA: Diagnosis not present

## 2023-10-21 DIAGNOSIS — G4731 Primary central sleep apnea: Secondary | ICD-10-CM | POA: Diagnosis present

## 2023-10-21 DIAGNOSIS — E876 Hypokalemia: Secondary | ICD-10-CM | POA: Diagnosis present

## 2023-10-21 DIAGNOSIS — I5023 Acute on chronic systolic (congestive) heart failure: Secondary | ICD-10-CM | POA: Diagnosis present

## 2023-10-21 DIAGNOSIS — Z716 Tobacco abuse counseling: Secondary | ICD-10-CM

## 2023-10-21 DIAGNOSIS — E66813 Obesity, class 3: Secondary | ICD-10-CM | POA: Diagnosis present

## 2023-10-21 DIAGNOSIS — Z5329 Procedure and treatment not carried out because of patient's decision for other reasons: Secondary | ICD-10-CM | POA: Diagnosis present

## 2023-10-21 DIAGNOSIS — I50812 Chronic right heart failure: Secondary | ICD-10-CM

## 2023-10-21 DIAGNOSIS — I5021 Acute systolic (congestive) heart failure: Principal | ICD-10-CM

## 2023-10-21 DIAGNOSIS — Z7985 Long-term (current) use of injectable non-insulin antidiabetic drugs: Secondary | ICD-10-CM | POA: Diagnosis not present

## 2023-10-21 DIAGNOSIS — Z79899 Other long term (current) drug therapy: Secondary | ICD-10-CM | POA: Diagnosis not present

## 2023-10-21 DIAGNOSIS — Z8249 Family history of ischemic heart disease and other diseases of the circulatory system: Secondary | ICD-10-CM | POA: Diagnosis not present

## 2023-10-21 DIAGNOSIS — I429 Cardiomyopathy, unspecified: Secondary | ICD-10-CM | POA: Diagnosis present

## 2023-10-21 DIAGNOSIS — R0609 Other forms of dyspnea: Secondary | ICD-10-CM | POA: Diagnosis not present

## 2023-10-21 DIAGNOSIS — I5A Non-ischemic myocardial injury (non-traumatic): Secondary | ICD-10-CM | POA: Diagnosis present

## 2023-10-21 DIAGNOSIS — F17290 Nicotine dependence, other tobacco product, uncomplicated: Secondary | ICD-10-CM | POA: Diagnosis present

## 2023-10-21 DIAGNOSIS — R0989 Other specified symptoms and signs involving the circulatory and respiratory systems: Secondary | ICD-10-CM | POA: Diagnosis present

## 2023-10-21 DIAGNOSIS — Z91148 Patient's other noncompliance with medication regimen for other reason: Secondary | ICD-10-CM

## 2023-10-21 DIAGNOSIS — I110 Hypertensive heart disease with heart failure: Principal | ICD-10-CM | POA: Diagnosis present

## 2023-10-21 DIAGNOSIS — M549 Dorsalgia, unspecified: Secondary | ICD-10-CM | POA: Diagnosis present

## 2023-10-21 DIAGNOSIS — R000 Tachycardia, unspecified: Secondary | ICD-10-CM | POA: Diagnosis present

## 2023-10-21 DIAGNOSIS — Z5986 Financial insecurity: Secondary | ICD-10-CM

## 2023-10-21 DIAGNOSIS — Z6841 Body Mass Index (BMI) 40.0 and over, adult: Secondary | ICD-10-CM

## 2023-10-21 LAB — COOXEMETRY PANEL
Carboxyhemoglobin: 3.8 % — ABNORMAL HIGH (ref 0.5–1.5)
Methemoglobin: <0.7 % (ref 0.0–1.5)
O2 Saturation: 72.5 %
Total hemoglobin: 14.3 g/dL (ref 12.0–16.0)

## 2023-10-21 LAB — LACTIC ACID, PLASMA: Lactic Acid, Venous: 1.2 mmol/L (ref 0.5–1.9)

## 2023-10-21 LAB — HEMOGLOBIN A1C
Hgb A1c MFr Bld: 4.8 % (ref 4.8–5.6)
Mean Plasma Glucose: 91.06 mg/dL

## 2023-10-21 LAB — BASIC METABOLIC PANEL WITH GFR
Anion gap: 9 (ref 5–15)
BUN: 11 mg/dL (ref 6–20)
CO2: 22 mmol/L (ref 22–32)
Calcium: 8.8 mg/dL — ABNORMAL LOW (ref 8.9–10.3)
Chloride: 108 mmol/L (ref 98–111)
Creatinine, Ser: 0.98 mg/dL (ref 0.61–1.24)
GFR, Estimated: >60 mL/min (ref >60)
Glucose, Bld: 106 mg/dL — ABNORMAL HIGH (ref 70–99)
Potassium: 3.9 mmol/L (ref 3.5–5.1)
Sodium: 139 mmol/L (ref 135–145)

## 2023-10-21 LAB — RAPID URINE DRUG SCREEN, HOSP PERFORMED
Amphetamines: NOT DETECTED
Barbiturates: NOT DETECTED
Benzodiazepines: NOT DETECTED
Cocaine: NOT DETECTED
Opiates: POSITIVE — AB
Tetrahydrocannabinol: NOT DETECTED

## 2023-10-21 LAB — TROPONIN I (HIGH SENSITIVITY)
Troponin I (High Sensitivity): 45 ng/L — ABNORMAL HIGH (ref <18)
Troponin I (High Sensitivity): 51 ng/L — ABNORMAL HIGH (ref <18)

## 2023-10-21 LAB — CBC
HCT: 48.3 % (ref 39.0–52.0)
Hemoglobin: 14.9 g/dL (ref 13.0–17.0)
MCH: 26.2 pg (ref 26.0–34.0)
MCHC: 30.8 g/dL (ref 30.0–36.0)
MCV: 85 fL (ref 80.0–100.0)
Platelets: 402 10*3/uL — ABNORMAL HIGH (ref 150–400)
RBC: 5.68 MIL/uL (ref 4.22–5.81)
RDW: 17 % — ABNORMAL HIGH (ref 11.5–15.5)
WBC: 10.8 10*3/uL — ABNORMAL HIGH (ref 4.0–10.5)
nRBC: 0 % (ref 0.0–0.2)

## 2023-10-21 LAB — DIGOXIN LEVEL: Digoxin Level: <0.4 ng/mL — ABNORMAL LOW (ref 0.8–2.0)

## 2023-10-21 LAB — TSH: TSH: 1.582 u[IU]/mL (ref 0.350–4.500)

## 2023-10-21 LAB — BRAIN NATRIURETIC PEPTIDE: B Natriuretic Peptide: 403.9 pg/mL — ABNORMAL HIGH (ref 0.0–100.0)

## 2023-10-21 LAB — HIV ANTIBODY (ROUTINE TESTING W REFLEX): HIV Screen 4th Generation wRfx: NONREACTIVE

## 2023-10-21 MED ORDER — OXYCODONE HCL 5 MG PO TABS
5.0000 mg | ORAL_TABLET | Freq: Four times a day (QID) | ORAL | Status: DC | PRN
  Administered 2023-10-21 – 2023-10-23 (×4): 5 mg via ORAL
  Filled 2023-10-21 (×4): qty 1

## 2023-10-21 MED ORDER — ONDANSETRON HCL 4 MG PO TABS: 4.0000 mg | ORAL_TABLET | Freq: Four times a day (QID) | ORAL | Status: DC | PRN

## 2023-10-21 MED ORDER — SPIRONOLACTONE 25 MG PO TABS
25.0000 mg | ORAL_TABLET | Freq: Every day | ORAL | Status: DC
  Administered 2023-10-21 – 2023-10-23 (×3): 25 mg via ORAL
  Filled 2023-10-21 (×3): qty 1

## 2023-10-21 MED ORDER — FUROSEMIDE 10 MG/ML IJ SOLN
80.0000 mg | Freq: Two times a day (BID) | INTRAMUSCULAR | Status: DC
  Administered 2023-10-21 – 2023-10-23 (×4): 80 mg via INTRAVENOUS
  Filled 2023-10-21 (×5): qty 8

## 2023-10-21 MED ORDER — IVABRADINE HCL 5 MG PO TABS
7.5000 mg | ORAL_TABLET | Freq: Two times a day (BID) | ORAL | Status: DC
  Administered 2023-10-21: 7.5 mg via ORAL
  Filled 2023-10-21 (×2): qty 1

## 2023-10-21 MED ORDER — IOHEXOL 350 MG/ML SOLN
75.0000 mL | Freq: Once | INTRAVENOUS | Status: AC | PRN
  Administered 2023-10-21: 75 mL via INTRAVENOUS

## 2023-10-21 MED ORDER — FUROSEMIDE 10 MG/ML IJ SOLN: 40.0000 mg | Freq: Once | INTRAMUSCULAR | Status: DC

## 2023-10-21 MED ORDER — ACETAMINOPHEN 650 MG RE SUPP: 650.0000 mg | Freq: Four times a day (QID) | RECTAL | Status: DC | PRN

## 2023-10-21 MED ORDER — ONDANSETRON HCL 4 MG/2ML IJ SOLN
4.0000 mg | Freq: Four times a day (QID) | INTRAMUSCULAR | Status: DC | PRN
Start: 2023-10-21 — End: 2023-10-23

## 2023-10-21 MED ORDER — DIGOXIN 125 MCG PO TABS
0.1250 mg | ORAL_TABLET | Freq: Every day | ORAL | Status: DC
  Administered 2023-10-21 – 2023-10-23 (×3): 0.125 mg via ORAL
  Filled 2023-10-21 (×3): qty 1

## 2023-10-21 MED ORDER — HYDRALAZINE HCL 25 MG PO TABS: 12.5000 mg | ORAL_TABLET | Freq: Three times a day (TID) | ORAL | Status: DC

## 2023-10-21 MED ORDER — SENNOSIDES-DOCUSATE SODIUM 8.6-50 MG PO TABS: 1.0000 | ORAL_TABLET | Freq: Every evening | ORAL | Status: DC | PRN

## 2023-10-21 MED ORDER — ENOXAPARIN SODIUM 80 MG/0.8ML IJ SOSY
80.0000 mg | PREFILLED_SYRINGE | INTRAMUSCULAR | Status: DC
  Administered 2023-10-22 – 2023-10-23 (×2): 80 mg via SUBCUTANEOUS
  Filled 2023-10-21 (×2): qty 0.8

## 2023-10-21 MED ORDER — LOSARTAN POTASSIUM 25 MG PO TABS
25.0000 mg | ORAL_TABLET | Freq: Every day | ORAL | Status: DC
  Administered 2023-10-21: 25 mg via ORAL
  Filled 2023-10-21: qty 1

## 2023-10-21 MED ORDER — OXYCODONE HCL 5 MG PO TABS
5.0000 mg | ORAL_TABLET | Freq: Once | ORAL | Status: AC
  Administered 2023-10-21: 5 mg via ORAL
  Filled 2023-10-21: qty 1

## 2023-10-21 MED ORDER — ACETAMINOPHEN 325 MG PO TABS
650.0000 mg | ORAL_TABLET | Freq: Four times a day (QID) | ORAL | Status: DC | PRN
  Administered 2023-10-21 – 2023-10-23 (×3): 650 mg via ORAL
  Filled 2023-10-21 (×3): qty 2

## 2023-10-21 NOTE — H&P (Incomplete)
 History and Physical  Donald Murray UJW:[id number] DOB: 12-20-1985 DOA: 10/21/2023  PCP: Patient, [name]   Chief Complaint: Shortness of breath  HPI: Donald Murray is a 38 y.o. male with medical history significant for morbid obesity, chronic systolic heart failure, HTN, IVDU (heroin) and tobacco use who presented to the ED for evaluation of shortness of breath and dyspnea on exertion.  ED Course: Initial vitals showed temp 98.1, RR 34, HR 130, BP 165/114, SpO2 97% on room air.  Initial labs significant for creatinine 0.98, WBC 10.8, Hgb 14.9, troponin 45-51, BNP 404, lactic acid 1.2, TSH 1.58, A1c 4.8%, digoxin  level <0.4, UDS positive for opioids.  EKG shows sinus tach with no acute ischemic changes. CXR shows mild pulmonary venous congestion and minimal interstitial edema.  CTA chest PE study negative for PE but shows evidence of cardiomegaly and pulmonary edema.  Patient received oxycodone  5 mg x 1.  Cardiology was consulted for evaluation.  TRH was consulted for admission.  Review of Systems: Please see HPI for pertinent positives and negatives. A complete 10 system review of systems are otherwise negative.  Past Medical History:  Diagnosis Date   Abdominal hernia    CHF (congestive heart failure) (HCC)    Hypertension    Obesity    Snoring    Past Surgical History:  Procedure Laterality Date   I & D EXTREMITY Right 03/04/2019   Procedure: IRRIGATION AND DEBRIDEMENT OF RIGHT ELBOW;  Surgeon: Ronn Cohn, MD;  Location: [facility] OR;  Service: Orthopedics;  Laterality: Right;   IRRIGATION AND DEBRIDEMENT ABSCESS Left 03/04/2019   Procedure: Irrigation And Debridement Abscess Left hand;  Surgeon: Ronn Cohn, MD;  Location: [facility] OR;  Service: Orthopedics;  Laterality: Left;   RIGHT/LEFT HEART CATH AND CORONARY ANGIOGRAPHY N/A 06/12/2021   Procedure: RIGHT/LEFT HEART CATH AND CORONARY ANGIOGRAPHY;  Surgeon: Mardell Shade, MD;  Location: [facility] INVASIVE CV LAB;  Service:  Cardiovascular;  Laterality: N/A;   UMBILICAL HERNIA REPAIR     Social History:  reports that he has been smoking cigarettes. He has a 16 pack-year smoking history. He has never used smokeless tobacco. He reports that he does not currently use alcohol. He reports that he does not currently use drugs.  No Known Allergies  Family History  Problem Relation Age of Onset   Hypertension Mother    Hypertension Father      Prior to Admission medications   Medication Sig Start Date End Date Taking? Authorizing Provider  acetaminophen  (TYLENOL ) 500 MG tablet Take 1 tablet (500 mg total) by mouth every 6 (six) hours as needed. Patient taking differently: Take 500 mg by mouth every 6 (six) hours as needed for mild pain (pain score 1-3). 09/19/23  Yes Rolinda Climes, DO  digoxin  (LANOXIN ) 0.125 MG tablet Take 1 tablet (0.125 mg total) by mouth daily. 11/02/22  Yes Bensimhon, Rheta Celestine, MD  ivabradine  (CORLANOR ) 7.5 MG TABS tablet Take 1 tablet by mouth 2 times daily with a meal. 11/02/22  Yes Bensimhon, Rheta Celestine, MD  metoprolol  succinate (TOPROL  XL) 25 MG 24 hr tablet Take 1 tablet (25 mg total) by mouth daily. 08/06/23  Yes Milford, Arlice Bene, FNP  oxyCODONE  (ROXICODONE ) 5 MG immediate release tablet Take 1 tablet (5 mg total) by mouth every 4 (four) hours as needed for severe pain (pain score 7-10). 10/13/23  Yes Pfeiffer, Bufford Carne, MD  Semaglutide -Weight Management (WEGOVY ) 2.4 MG/0.75ML SOAJ Inject 2.4 mg into the skin once a week. 09/14/23  Yes Crenshaw,  Deannie Fabian, MD  spironolactone  (ALDACTONE ) 25 MG tablet Take 1 tablet (25 mg total) by mouth daily. 09/05/23  Yes Samtani, Jai-Gurmukh, MD  torsemide  (DEMADEX ) 20 MG tablet Take 4 tablets (80 mg total) by mouth daily. 09/05/23  Yes Samtani, Jai-Gurmukh, MD  potassium chloride  SA (KLOR-CON  M) 20 MEQ tablet Take 1 tablet (20 mEq total) by mouth 2 (two) times daily. Patient not taking: Reported on 09/14/2023 09/01/23   Lowery Rue, DO    Physical Exam: BP 124/71    Pulse (!) 58   Temp 98.2 F (36.8 C) (Oral)   Resp (!) 26   Ht 5\' 11"  (1.803 m)   Wt (!) 165.4 kg   SpO2 94%   BMI 50.86 kg/m  General: Pleasant, well-appearing *** laying in bed. No acute distress. HEENT: [place]/AT. Anicteric sclera CV: RRR. No murmurs, rubs, or gallops. No LE edema Pulmonary: Lungs CTAB. Normal effort. No wheezing or rales. Abdominal: Soft, nontender, nondistended. Normal bowel sounds. Extremities: Palpable radial and DP pulses. Normal ROM. Skin: Warm and dry. No obvious rash or lesions. Neuro: A&Ox3. Moves all extremities. Normal sensation to light touch. No focal deficit. Psych: Normal mood and affect          Labs on Admission:  Basic Metabolic Panel: Recent Labs  Lab 10/21/23 1223  NA 139  K 3.9  CL 108  CO2 22  GLUCOSE 106*  BUN 11  CREATININE 0.98  CALCIUM 8.8*   Liver Function Tests: No results for input(s): "AST", "ALT", "ALKPHOS", "BILITOT", "PROT", "ALBUMIN" in the last 168 hours. No results for input(s): "LIPASE", "AMYLASE" in the last 168 hours. No results for input(s): "AMMONIA" in the last 168 hours. CBC: Recent Labs  Lab 10/21/23 1223  WBC 10.8*  HGB 14.9  HCT 48.3  MCV 85.0  PLT 402*   Cardiac Enzymes: No results for input(s): "CKTOTAL", "CKMB", "CKMBINDEX", "TROPONINI" in the last 168 hours. BNP (last 3 results) Recent Labs    08/06/23 1153 09/14/23 1342 10/21/23 1223  BNP 124.6* 346.4* 403.9*    ProBNP (last 3 results) No results for input(s): "PROBNP" in the last 8760 hours.  CBG: No results for input(s): "GLUCAP" in the last 168 hours.  Radiological Exams on Admission: CT Angio Chest PE W and/or Wo Contrast Result Date: 10/21/2023 CLINICAL DATA:  Pulmonary embolism (PE) suspected, high prob Shortness of breath. EXAM: CT ANGIOGRAPHY CHEST WITH CONTRAST TECHNIQUE: Multidetector CT imaging of the chest was performed using the standard protocol during bolus administration of intravenous contrast. Multiplanar CT  image reconstructions and MIPs were obtained to evaluate the vascular anatomy. RADIATION DOSE REDUCTION: This exam was performed according to the departmental dose-optimization program which includes automated exposure control, adjustment of the mA and/or kV according to patient size and/or use of iterative reconstruction technique. CONTRAST:  75mL OMNIPAQUE  IOHEXOL  350 MG/ML SOLN COMPARISON:  Radiograph earlier today.  Chest CT 08/15/2020 FINDINGS: Cardiovascular: There are no filling defects within the pulmonary arteries to suggest pulmonary embolus. The main pulmonary artery is dilated at 3.8 cm. The heart is enlarged. No pericardial effusion. Normal caliber thoracic aorta. Mediastinum/Nodes: Stable prominent bilateral hilar lymph nodes, up to 11 mm on the right series 7, image 82. No change from 2022 exam. Scattered small mediastinal nodes are not enlarged by size criteria. Decompressed esophagus. Lungs/Pleura: Small bilateral pleural effusions, right greater than left. Mild diffuse ground-glass and septal thickening suspicious for pulmonary edema. Small ground-glass nodules in the left upper lobe, series 8, images 19, 22, 28. Mild bronchial  thickening is likely congestive. Upper Abdomen: Hepatic steatosis. Slightly lobulated hepatic contours. No acute upper abdominal findings. Musculoskeletal: There are no acute or suspicious osseous abnormalities. Review of the MIP images confirms the above findings. IMPRESSION: 1. No pulmonary embolus. 2. Cardiomegaly with small bilateral pleural effusions, right greater than left. Mild diffuse ground-glass and septal thickening suspicious for pulmonary edema. 3. Small ground-glass nodules in the left upper lobe, likely infectious or inflammatory. 4. Dilated main pulmonary artery, can be seen with pulmonary arterial hypertension. 5. Hepatic steatosis. Slightly lobulated hepatic contours, can be seen with cirrhosis. Electronically Signed   By: Chadwick Colonel M.D.   On:  10/21/2023 18:33   US  EKG SITE RITE Result Date: 10/21/2023 If Site Rite image not attached, placement could not be confirmed due to current cardiac rhythm.  DG Chest 2 View Result Date: 10/21/2023 CLINICAL DATA:  Shortness of breath. EXAM: CHEST - 2 VIEW COMPARISON:  07/06/2022 FINDINGS: Stable enlarged cardiac silhouette. Mildly prominent pulmonary vasculature and interstitial markings with improvement. Interval mild-to-moderate peribronchial thickening. Unremarkable bones. IMPRESSION: 1. Interval mild-to-moderate bronchitic changes. 2. Mild pulmonary venous congestion and minimal interstitial edema/chronic interstitial lung disease with improvement. Electronically Signed   By: Catherin Closs M.D.   On: 10/21/2023 14:33   Assessment/Plan Donald Murray is a 38 y.o. male with medical history significant for morbid obesity, chronic systolic heart failure, HTN, IVDU (heroin), OSA and tobacco use who presented to the ED for evaluation of shortness of breath and dyspnea on exertion.   # Acute on chronic systolic heart failure  # HTN  #***  # OSA  # Hx of IVDU  # Class III obesity  #***  DVT prophylaxis: Lovenox      Code Status: Full Code  Consults called: Cardiology  Family Communication: No family at bedside  Severity of Illness: The appropriate patient status for this patient is INPATIENT. Inpatient status is judged to be reasonable and necessary in order to provide the required intensity of service to ensure the patient's safety. The patient's presenting symptoms, physical exam findings, and initial radiographic and laboratory data in the context of their chronic comorbidities is felt to place them at high risk for further clinical deterioration. Furthermore, it is not anticipated that the patient will be medically stable for discharge from the hospital within 2 midnights of admission.   * I certify that at the point of admission it is my clinical judgment that the patient will  require inpatient hospital care spanning beyond 2 midnights from the point of admission due to high intensity of service, high risk for further deterioration and high frequency of surveillance required.*  Level of care: Telemetry Cardiac   This record has been created using Dragon voice recognition software. Errors have been sought and corrected, but may not always be located. Such creation errors do not reflect on the standard of care.   Vita Grip, MD 10/21/2023, 10:49 PM [facility] Pager: [phone] Isaiah [phone]:10   If 7PM-7AM, please contact night-coverage [email or web address] Password [email or web address]

## 2023-10-21 NOTE — ED Provider Notes (Signed)
 Patient signed out to me by previous provider. Please refer to their note for full HPI.  Briefly this is a 38 year old male with past medical history of CHF who presented with shortness of breath.  Patient signed out pending CT PE study to rule out any other acute findings.  Cardiology has already seen the patient and we expect admission with the medicine team for CHF exacerbation.  CT PE study shows no pulmonary embolus.  There is cardiomegaly with small effusions and findings of pulmonary edema.  On reevaluation patient is urinating a lot with good diuresis.  The above plan stands and will admit to medicine for further evaluation and care.  Patients evaluation and results requires admission for further treatment and care.  Spoke with hospitalist, reviewed patient's ED course and they accept admission.  Patient agrees with admission plan, offers no new complaints and is stable/unchanged at time of admit.   Flonnie Humphrey, DO 10/21/23 1929

## 2023-10-21 NOTE — Progress Notes (Signed)
 Spoke with Primary RN Nadine, PICC will not be placed tonight. Patient has 1 working PIV at this time.

## 2023-10-21 NOTE — Consult Note (Signed)
 Advanced Heart Failure Team Consult Note   Primary Physician: Patient, [name] Cardiologist:  Alexandria Angel, MD  Reason for Consultation: A/C HFrEF  HPI:    Donald Murray is seen today for evaluation of A/C HFrEF at the request of Dr. Joli Neas, Emergency Medicine.  Donald Murray is a morbidly obese 38 y.o. male with HTN, previous IVDA (heroin), tobacco use and systolic HF (onset 9/21).   Admitted 9/21 with new onset HF in setting of severe HTN 174/128. ECG with ST and frequent PVCs. Echo EF 20-25% Moderate RV dysfunction.    HF consult for the first time 12/21. 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 Zio placed to quantify PVC burden - 4.3%.   Admitted 12/22 with ADHF. Echo LVEF <20% w/ global HK, no visible thrombus, RV okay, mild MR.   LHC 1/23 with normal cors. EF < 10% Elevated filling pressures (PCWP 28) and low output (CI 1.5). Lasix  increased to 80 bid.   Sleep study 2/23: Severe OSA, moderate central sleep apnea. Has been unable to afford and thus never started.    Seen by Dr. Rodolfo Clan in 7/23 and planning ICD. But hasn't been done due to scheduling issues.    Echo 11/24: EF 25%, RV moderately down   06/24/23. Seen in HF clinic. Volume overloaded.  Given 80 mg Furoscix . He wore on body infuser 4 hours then he accidentally pulled off. He reports lots of urine output.    Admitted 09/02/23 for incarcerated ventral hernia s/p repair. Course uncomplicated but now dealing with seroma.   Admitted today with 2 weeks of worsening SOB, confusion, tachycardia, orthonea, PND and chest pressure for the last week. Reports compliance with medications 80% of the time. Occasionally misses doses. Reports chest pressure as central and it happens when he lays flat to rest. Denies chest pain currently. Over the last week he notices his UOP decrease with his Torsemide . Doesn't eat too much 2/2 wegovy  but does eat out 50% of the time when he does eat. Drinks >1 gallon of fluid a day. Denies ETOH or  illicit drug use. Smokes 1PPD. Still working at [employer]. Admission labs reviewed: Scr 0.98, K 3.9, BNP 404, HsTrop 45, Hgb WNL, EKG with ST. CXR with mild pulmonary congestion. AHF team consulted for further management.   Home Medications Prior to Admission medications   Medication Sig Start Date End Date Taking? Authorizing Provider  acetaminophen  (TYLENOL ) 500 MG tablet Take 1 tablet (500 mg total) by mouth every 6 (six) hours as needed. 09/19/23   Rolinda Climes, DO  digoxin  (LANOXIN ) 0.125 MG tablet Take 1 tablet (0.125 mg total) by mouth daily. 11/02/22   Bensimhon, Rheta Celestine, MD  ivabradine  (CORLANOR ) 7.5 MG TABS tablet Take 1 tablet by mouth 2 times daily with a meal. 11/02/22   Bensimhon, Rheta Celestine, MD  methocarbamol  (ROBAXIN ) 500 MG tablet Take 1 tablet (500 mg total) by mouth 4 (four) times daily for 10 days 09/22/23     metoprolol  succinate (TOPROL  XL) 25 MG 24 hr tablet Take 1 tablet (25 mg total) by mouth daily. 08/06/23   Milford, Arlice Bene, FNP  naproxen  (NAPROSYN ) 375 MG tablet Take 1 tablet (375 mg total) by mouth 2 (two) times daily. 09/19/23   Rolinda Climes, DO  ondansetron  (ZOFRAN -ODT) 4 MG disintegrating tablet Dissolve 1 tablet under the tongue every 8 (eight) hours as needed. 09/01/23   Curatolo, Adam, DO  oxyCODONE  (ROXICODONE ) 5 MG immediate release tablet Take 1 tablet (5 mg  total) by mouth every 6 (six) hours as needed. 09/04/23   Samtani, Jai-Gurmukh, MD  oxyCODONE  (ROXICODONE ) 5 MG immediate release tablet Take 1 tablet (5 mg total) by mouth every 4 (four) hours as needed for severe pain (pain score 7-10). 10/13/23   Wynetta Heckle, MD  oxyCODONE -acetaminophen  (PERCOCET/ROXICET) 5-325 MG tablet Take 1 tablet by mouth every 8 (eight) hours as needed for severe pain (pain score 7-10). 09/19/23   Rolinda Climes, DO  oxyCODONE -acetaminophen  (PERCOCET/ROXICET) 5-325 MG tablet Take 1 tablet by mouth every 6 (six) hours as needed for Pain for up to 5 days 09/22/23     potassium  chloride SA (KLOR-CON  M) 20 MEQ tablet Take 1 tablet (20 mEq total) by mouth 2 (two) times daily. Patient not taking: Reported on 09/14/2023 09/01/23   Lowery Rue, DO  Semaglutide -Weight Management (WEGOVY ) 2.4 MG/0.75ML SOAJ Inject 2.4 mg into the skin once a week. 09/14/23   Lenise Quince, MD  spironolactone  (ALDACTONE ) 25 MG tablet Take 1 tablet (25 mg total) by mouth daily. 09/05/23   Samtani, Jai-Gurmukh, MD  torsemide  (DEMADEX ) 20 MG tablet Take 4 tablets (80 mg total) by mouth daily. 09/05/23   Samtani, Jai-Gurmukh, MD    Past Medical History: Past Medical History:  Diagnosis Date   Abdominal hernia    CHF (congestive heart failure) (HCC)    Hypertension    Obesity    Snoring     Past Surgical History: Past Surgical History:  Procedure Laterality Date   I & D EXTREMITY Right 03/04/2019   Procedure: IRRIGATION AND DEBRIDEMENT OF RIGHT ELBOW;  Surgeon: Ronn Cohn, MD;  Location: [facility] OR;  Service: Orthopedics;  Laterality: Right;   IRRIGATION AND DEBRIDEMENT ABSCESS Left 03/04/2019   Procedure: Irrigation And Debridement Abscess Left hand;  Surgeon: Ronn Cohn, MD;  Location: [facility] OR;  Service: Orthopedics;  Laterality: Left;   RIGHT/LEFT HEART CATH AND CORONARY ANGIOGRAPHY N/A 06/12/2021   Procedure: RIGHT/LEFT HEART CATH AND CORONARY ANGIOGRAPHY;  Surgeon: Mardell Shade, MD;  Location: [facility] INVASIVE CV LAB;  Service: Cardiovascular;  Laterality: N/A;   UMBILICAL HERNIA REPAIR      Family History: Family History  Problem Relation Age of Onset   Hypertension Mother    Hypertension Father     Social History: Social History   Socioeconomic History   Marital status: Single    Spouse name: Not on file   Number of children: Not on file   Years of education: Not on file   Highest education level: Not on file  Occupational History   Not on file  Tobacco Use   Smoking status: Every Day    Current packs/day: 1.00    Average packs/day: 1 pack/day for 16.0 years  (16.0 ttl pk-yrs)    Types: Cigarettes   Smokeless tobacco: Never  Vaping Use   Vaping status: Some Days   Substances: Nicotine   Substance and Sexual Activity   Alcohol use: Not Currently    Comment: Pt stated "2 years clean"   Drug use: Not Currently    Comment: Pt stated "It was opiates"   Sexual activity: Not on file  Other Topics Concern   Not on file  Social History Narrative   Not on file   Social Drivers of Health   Financial Resource Strain: High Risk (04/08/2022)   Overall Financial Resource Strain (CARDIA)    Difficulty of Paying Living Expenses: Very hard  Food Insecurity: No Food Insecurity (09/02/2023)   Hunger Vital Sign  Worried About Programme researcher, broadcasting/film/video in the Last Year: Never true    Ran Out of Food in the Last Year: Never true  Transportation Needs: No Transportation Needs (09/02/2023)   PRAPARE - Administrator, Civil Service (Medical): No    Lack of Transportation (Non-Medical): No  Physical Activity: Not on file  Stress: Not on file  Social Connections: Not on file    Allergies:  No Known Allergies  Objective:    Vital Signs:   Temp:  [98.1 F (36.7 C)] 98.1 F (36.7 C) (05/29 1217) Pulse Rate:  [55-130] 55 (05/29 1341) Resp:  [28-34] 28 (05/29 1341) BP: (124-165)/(100-114) 124/100 (05/29 1341) SpO2:  [97 %-99 %] 99 % (05/29 1341) Weight:  [172.4 kg] 172.4 kg (05/29 1216)    Weight change: Filed Weights   10/21/23 1216  Weight: (!) 172.4 kg    Intake/Output:  No intake or output data in the 24 hours ending 10/21/23 1355    Physical Exam    General:  tired appearing.  No respiratory difficulty. Sitting on EOB.  Neck: supple. JVD ~14 cm.  Cor: PMI nondisplaced. Irregular rate & regular rhythm. No rubs, gallops or murmurs. Lungs: diminished Extremities: no cyanosis, clubbing, rash, +2 BLE edema  Neuro: alert & oriented x 3. Moves all 4 extremities w/o difficulty. Affect pleasant.   Telemetry   ST low 100s (Personally  reviewed)    EKG    ST 130  Labs   Basic Metabolic Panel: Recent Labs  Lab 10/21/23 1223  NA 139  K 3.9  CL 108  CO2 22  GLUCOSE 106*  BUN 11  CREATININE 0.98  CALCIUM 8.8*    Liver Function Tests: No results for input(s): "AST", "ALT", "ALKPHOS", "BILITOT", "PROT", "ALBUMIN" in the last 168 hours. No results for input(s): "LIPASE", "AMYLASE" in the last 168 hours. No results for input(s): "AMMONIA" in the last 168 hours.  CBC: Recent Labs  Lab 10/21/23 1223  WBC 10.8*  HGB 14.9  HCT 48.3  MCV 85.0  PLT 402*    Cardiac Enzymes: No results for input(s): "CKTOTAL", "CKMB", "CKMBINDEX", "TROPONINI" in the last 168 hours.  BNP: BNP (last 3 results) Recent Labs    08/06/23 1153 09/14/23 1342 10/21/23 1223  BNP 124.6* 346.4* 403.9*    ProBNP (last 3 results) No results for input(s): "PROBNP" in the last 8760 hours.   CBG: No results for input(s): "GLUCAP" in the last 168 hours.  Coagulation Studies: No results for input(s): "LABPROT", "INR" in the last 72 hours.   Imaging   No results found.   Medications:     Current Medications:  furosemide   40 mg Intravenous Once   oxyCODONE   5 mg Oral Once    Infusions:     Patient Profile   Leelynd Maldonado is a morbidly obese 38 y.o. male with HTN, previous IVDA (heroin), tobacco use and systolic HF. Admitted with A/C HFrEF.   Assessment/Plan   1. Acute on chronic systolic HF - Echo 9/21 EF 20-25% RV moderately HK. Suspect HTN vs PVC-mediated (PVC burden 4.3% - probably not high enough to cause CM) - LHC 1/23 no CAD EF < 10% - CPX 2/23 pVO2: 21.6 (87% predicted peak VO2) corrected for ibw pVO2 41.6 ml/kg (ibw)/min (99% of the ibw-adjusted predicted). Slope:45 O2pulse:  19 (90% pred). Test reassuring VO2 but slope high  - Echo 5/23, 2/24, and 11/24: EF 20-25% RV moderately HK - NYHA IV on admission, limited by body habitus  and back pain.  - Appears mildly volume overloaded though difficult to  tell d/t body habitus. Will start diuresis with 80 IV lasix  and follow response.  - Discussed PICC placement with patient and he agrees to proceed. Would help us  better gauge fluid and follow co-ox, suspect low-output HF. May need inotrope support.  - Check CVP once placed.  - Hold BB with low-output HF. - Continue Digoxin  0.125 mg daily. Check level today - Continue Spiro 25 mg daily. - Continue Ivabradine  7.5 mg bid - SGLT2i held recently with surgery and seroma. Was going to restart pending surgery f/u. - Undecided about ICD. Keeps having scheduling issues with Dr. Doyle Generous office.  - Has previously been seen and followed in VAD clinic but ended up symptomatically improving and he didn't want to go through with LVAD at that time. Discussed again today and he is open to talking about it again. He wants to work on improving symptoms first but is more open to following through with LVAD if necessary. Suspect he is still low output and may require inotrope support to diurese. Will need PFTs and further workup.   2. Chest Pain - Denies CP on assessment - HsTrop flat, suspect 2/2 congestion - EKG with no acute changed just ST - LHC with clean coronaries 1/23   3. HTN - BP slgihtly elevated, has not taken medications this am - Continue meds as above   4. Sinus Tach  - Rate in low 100s today  - Hoplding BB - Continue Ivabradine .  - Check TSH - Continue digoxin  0.125 mg daily.    5.  H/O Frequent PVCs - Zio 12/21 4.3% PVCs - probably not high enough burden to cause CM  - Has severe OSA, unable to afford CPAP - None seen on tele review   6.  Morbid obesity - Body mass index is 53 kg/m.  - On WeGovy , has not had meaningful weight loss. Weight actually has continued to climb. 35-40 lbs up in 2 years.  - Consider switch to tirzepatide OP.   7. OSA, Severe - Not on CPAP yet.  - Cannot afford. Engage HFSW for resources.   8.  Tobacco use - Smoking 1 ppd - Discussed smoking cessation    9. H/O IVDA (heroin) - Last used 2020  - No change, check UDS    Length of Stay: 0  Sheryl Donna, NP  10/21/2023, 1:55 PM   Advanced Heart Failure Team Pager [phone] (M-F; 7a - 5p)  Please contact [facility] for night-coverage after hours (4p -7a ) and weekends on [email or web address]

## 2023-10-21 NOTE — ED Triage Notes (Signed)
 Patient presents with ongoing chest pain sob sweating difficulty lying flat.  Patient has a multitude list of complaints.  Has been dx with CHF but does take his meds.

## 2023-10-21 NOTE — ED Notes (Signed)
Transferred to floor at this time.

## 2023-10-21 NOTE — ED Provider Notes (Signed)
 [place] EMERGENCY DEPARTMENT AT [facility] Provider Note  CSN: [id number] Arrival date & time: 10/21/23 1211  Chief Complaint(s) Shortness of Breath and Chest Pain  HPI Donald Murray is a 38 y.o. male history of CHF presenting to the emergency department shortness of breath.  Patient reports that he has been having progressively worsening shortness of breath over the past few weeks, associated with dyspnea on exertion, orthopnea, fatigue.  He reports intermittent chest pain.  Has noticed that he has been having elevated heart rate as well.  No recent travel or surgeries.  No history of DVT or PE.  Had recent abdominal surgery,.  Was diagnosed with a postoperative seroma, has been having some discomfort from that, occasional nausea, none currently.  Had vomiting around a week ago but not recently.  No loss of consciousness or syncope.   Past Medical History Past Medical History:  Diagnosis Date   Abdominal hernia    CHF (congestive heart failure) (HCC)    Hypertension    Obesity    Snoring    Patient Active Problem List   Diagnosis Date Noted   Ventral hernia 09/02/2023   OSA (obstructive sleep apnea) 09/29/2021   Acute on chronic systolic heart failure (HCC) 05/21/2021   Acute on chronic heart failure (HCC) 12/29/2020   PVC (premature ventricular contraction) 08/15/2020   Essential hypertension 03/20/2020   Acute congestive heart failure (HCC)    Cellulitis 03/04/2019   IVDU (intravenous drug user) 03/04/2019   Home Medication(s) Prior to Admission medications   Medication Sig Start Date End Date Taking? Authorizing Provider  acetaminophen  (TYLENOL ) 500 MG tablet Take 1 tablet (500 mg total) by mouth every 6 (six) hours as needed. Patient taking differently: Take 500 mg by mouth every 6 (six) hours as needed for mild pain (pain score 1-3). 09/19/23  Yes Rolinda Climes, DO  digoxin  (LANOXIN ) 0.125 MG tablet Take 1 tablet (0.125 mg total) by mouth daily. 11/02/22  Yes  Bensimhon, Rheta Celestine, MD  ivabradine  (CORLANOR ) 7.5 MG TABS tablet Take 1 tablet by mouth 2 times daily with a meal. 11/02/22  Yes Bensimhon, Rheta Celestine, MD  metoprolol  succinate (TOPROL  XL) 25 MG 24 hr tablet Take 1 tablet (25 mg total) by mouth daily. 08/06/23  Yes Milford, Arlice Bene, FNP  oxyCODONE  (ROXICODONE ) 5 MG immediate release tablet Take 1 tablet (5 mg total) by mouth every 4 (four) hours as needed for severe pain (pain score 7-10). 10/13/23  Yes Pfeiffer, Bufford Carne, MD  Semaglutide -Weight Management (WEGOVY ) 2.4 MG/0.75ML SOAJ Inject 2.4 mg into the skin once a week. 09/14/23  Yes Lenise Quince, MD  spironolactone  (ALDACTONE ) 25 MG tablet Take 1 tablet (25 mg total) by mouth daily. 09/05/23  Yes Samtani, Jai-Gurmukh, MD  torsemide  (DEMADEX ) 20 MG tablet Take 4 tablets (80 mg total) by mouth daily. 09/05/23  Yes Samtani, Jai-Gurmukh, MD  naproxen  (NAPROSYN ) 375 MG tablet Take 1 tablet (375 mg total) by mouth 2 (two) times daily. Patient not taking: Reported on 10/21/2023 09/19/23   Rolinda Climes, DO  ondansetron  (ZOFRAN -ODT) 4 MG disintegrating tablet Dissolve 1 tablet under the tongue every 8 (eight) hours as needed. Patient not taking: Reported on 10/21/2023 09/01/23   Lowery Rue, DO  oxyCODONE  (ROXICODONE ) 5 MG immediate release tablet Take 1 tablet (5 mg total) by mouth every 6 (six) hours as needed. Patient not taking: Reported on 10/21/2023 09/04/23   Samtani, Jai-Gurmukh, MD  oxyCODONE -acetaminophen  (PERCOCET/ROXICET) 5-325 MG tablet Take 1 tablet by mouth  every 8 (eight) hours as needed for severe pain (pain score 7-10). Patient not taking: Reported on 10/21/2023 09/19/23   Rolinda Climes, DO  oxyCODONE -acetaminophen  (PERCOCET/ROXICET) 5-325 MG tablet Take 1 tablet by mouth every 6 (six) hours as needed for Pain for up to 5 days Patient not taking: Reported on 10/21/2023 09/22/23     potassium chloride  SA (KLOR-CON  M) 20 MEQ tablet Take 1 tablet (20 mEq total) by mouth 2 (two) times  daily. Patient not taking: Reported on 09/14/2023 09/01/23   Lowery Rue, DO                                                                                                                                    Past Surgical History Past Surgical History:  Procedure Laterality Date   I & D EXTREMITY Right 03/04/2019   Procedure: IRRIGATION AND DEBRIDEMENT OF RIGHT ELBOW;  Surgeon: Ronn Cohn, MD;  Location: [facility] OR;  Service: Orthopedics;  Laterality: Right;   IRRIGATION AND DEBRIDEMENT ABSCESS Left 03/04/2019   Procedure: Irrigation And Debridement Abscess Left hand;  Surgeon: Ronn Cohn, MD;  Location[facility];  Service: Orthopedics;  Laterality: Left;   RIGHT/LEFT HEART CATH AND CORONARY ANGIOGRAPHY N/A 06/12/2021   Procedure: RIGHT/LEFT HEART CATH AND CORONARY ANGIOGRAPHY;  Surgeon: Mardell Shade, MD;  Location: [facility] INVASIVE CV LAB;  Service: Cardiovascular;  Laterality: N/A;   UMBILICAL HERNIA REPAIR     Family History Family History  Problem Relation Age of Onset   Hypertension Mother    Hypertension Father     Social History Social History   Tobacco Use   Smoking status: Every Day    Current packs/day: 1.00    Average packs/day: 1 pack/day for 16.0 years (16.0 ttl pk-yrs)    Types: Cigarettes   Smokeless tobacco: Never  Vaping Use   Vaping status: Some Days   Substances: Nicotine   Substance Use Topics   Alcohol use: Not Currently    Comment: 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  Review of Systems Review of Systems  All other systems reviewed and are negative.   Physical Exam Vital Signs  I have reviewed the triage vital signs BP (!) 138/100   Pulse (!) 58   Temp 97.9 F (36.6 C) (Oral)   Resp (!) 59   Ht 5\' 11"  (1.803 m)   Wt (!) 172.4 kg   SpO2 97%   BMI 53.00 kg/m  Physical Exam Vitals and nursing note reviewed.  Constitutional:      General:  He is not in acute distress.    Appearance: Normal appearance. He is obese.  HENT:     Mouth/Throat:     Mouth: Mucous membranes are moist.  Eyes:     Conjunctiva/sclera: Conjunctivae normal.  Neck:  Vascular: JVD present.  Cardiovascular:     Rate and Rhythm: Normal rate and regular rhythm.  Pulmonary:     Effort: Pulmonary effort is normal. Tachypnea present.     Breath sounds: Examination of the right-lower field reveals rales. Examination of the left-lower field reveals rales. Rales present.  Abdominal:     General: Abdomen is flat.     Palpations: Abdomen is soft.     Tenderness: There is no abdominal tenderness.  Musculoskeletal:     Right lower leg: Edema present.     Left lower leg: Edema present.  Skin:    General: Skin is warm and dry.     Capillary Refill: Capillary refill takes less than 2 seconds.  Neurological:     Mental Status: He is alert and oriented to person, place, and time. Mental status is at baseline.  Psychiatric:        Mood and Affect: Mood normal.        Behavior: Behavior normal.     ED Results and Treatments Labs (all labs ordered are listed, but only abnormal results are displayed) Labs Reviewed  BASIC METABOLIC PANEL WITH GFR - Abnormal; Notable for the following components:      Result Value   Glucose, Bld 106 (*)    Calcium 8.8 (*)    All other components within normal limits  CBC - Abnormal; Notable for the following components:   WBC 10.8 (*)    RDW 17.0 (*)    Platelets 402 (*)    All other components within normal limits  BRAIN NATRIURETIC PEPTIDE - Abnormal; Notable for the following components:   B Natriuretic Peptide 403.9 (*)    All other components within normal limits  TROPONIN I (HIGH SENSITIVITY) - Abnormal; Notable for the following components:   Troponin I (High Sensitivity) 45 (*)    All other components within normal limits  DIGOXIN  LEVEL  RAPID URINE DRUG SCREEN, HOSP PERFORMED  TSH  HEMOGLOBIN A1C   COOXEMETRY PANEL  LACTIC ACID, PLASMA  TROPONIN I (HIGH SENSITIVITY)                                                                                                                          Radiology US  EKG SITE RITE Result Date: 10/21/2023 If Site Rite image not attached, placement could not be confirmed Murray to current cardiac rhythm.  DG Chest 2 View Result Date: 10/21/2023 CLINICAL DATA:  Shortness of breath. EXAM: CHEST - 2 VIEW COMPARISON:  07/06/2022 FINDINGS: Stable enlarged cardiac silhouette. Mildly prominent pulmonary vasculature and interstitial markings with improvement. Interval mild-to-moderate peribronchial thickening. Unremarkable bones. IMPRESSION: 1. Interval mild-to-moderate bronchitic changes. 2. Mild pulmonary venous congestion and minimal interstitial edema/chronic interstitial lung disease with improvement. Electronically Signed   By: Catherin Closs M.D.   On: 10/21/2023 14:33    Pertinent labs & imaging results that were available during my care of the patient were reviewed by me and considered in my medical  decision making (see MDM for details).  Medications Ordered in ED Medications  furosemide  (LASIX ) injection 80 mg (has no administration in time range)  digoxin  (LANOXIN ) tablet 0.125 mg (has no administration in time range)  ivabradine  (CORLANOR ) tablet 7.5 mg (has no administration in time range)  spironolactone  (ALDACTONE ) tablet 25 mg (has no administration in time range)  oxyCODONE  (Oxy IR/ROXICODONE ) immediate release tablet 5 mg (5 mg Oral Given 10/21/23 1555)  iohexol  (OMNIPAQUE ) 350 MG/ML injection 75 mL (75 mLs Intravenous Contrast Given 10/21/23 1628)                                                                                                                                     Procedures Procedures  (including critical care time)  Medical Decision Making / ED Course   MDM:  38 year old presenting to the emergency department shortness of  breath.  Patient appears volume overloaded on exam.  He does have lower extremity edema, mild tachypnea, some distant crackles.  Suspect CHF exacerbation.  Chest x-ray also appears to show volume overload on my interpretation.  Patient denies fevers to suggest pneumonia.  No pneumothorax.  Did have recent surgery not on anticoagulation so pulmonary embolism remains a possibility, will order PE study given tachycardia and tachypnea.  Patient not hypoxic, but given his work of breathing, think he will need to come in for further management.  Clinical Course as of 10/21/23 1651  Thu Oct 21, 2023  1648 Signed out to Dr. Carylon Claude pending CTA chest. Was seen by cardiology. Medications have been ordered.   [WS]    Clinical Course User Index [WS] Mordecai Applebaum, MD     Additional history obtained: -External records from outside source obtained and reviewed including: Chart review including previous notes, labs, imaging, consultation notes including elevated BNP    Lab Tests: -I ordered, reviewed, and interpreted labs.   The pertinent results include:   Labs Reviewed  BASIC METABOLIC PANEL WITH GFR - Abnormal; Notable for the following components:      Result Value   Glucose, Bld 106 (*)    Calcium 8.8 (*)    All other components within normal limits  CBC - Abnormal; Notable for the following components:   WBC 10.8 (*)    RDW 17.0 (*)    Platelets 402 (*)    All other components within normal limits  BRAIN NATRIURETIC PEPTIDE - Abnormal; Notable for the following components:   B Natriuretic Peptide 403.9 (*)    All other components within normal limits  TROPONIN I (HIGH SENSITIVITY) - Abnormal; Notable for the following components:   Troponin I (High Sensitivity) 45 (*)    All other components within normal limits  DIGOXIN  LEVEL  RAPID URINE DRUG SCREEN, HOSP PERFORMED  TSH  HEMOGLOBIN A1C  COOXEMETRY PANEL  LACTIC ACID, PLASMA  TROPONIN I (HIGH SENSITIVITY)    Notable for  elevated BNP. Elevated troponin  EKG  Sinus tachycardia     Imaging Studies ordered: I ordered imaging studies including CXR On my interpretation imaging demonstrates pulmonary embolism  I independently visualized and interpreted imaging. I agree with the radiologist interpretation   Medicines ordered and prescription drug management: Meds ordered this encounter  Medications   DISCONTD: furosemide  (LASIX ) injection 40 mg   oxyCODONE  (Oxy IR/ROXICODONE ) immediate release tablet 5 mg    Refill:  0   furosemide  (LASIX ) injection 80 mg   digoxin  (LANOXIN ) tablet 0.125 mg   ivabradine  (CORLANOR ) tablet 7.5 mg   spironolactone  (ALDACTONE ) tablet 25 mg   iohexol  (OMNIPAQUE ) 350 MG/ML injection 75 mL    -I have reviewed the patients home medicines and have made adjustments as needed   Consultations Obtained: I requested consultation with the cardiologist,  and discussed lab and imaging findings as well as pertinent plan - they recommend: diuresis    Cardiac Monitoring: The patient was maintained on a cardiac monitor.  I personally viewed and interpreted the cardiac monitored which showed an underlying rhythm of: sinus tachycardic   Social Determinants of Health:  Diagnosis or treatment significantly limited by social determinants of health: obesity   Reevaluation: After the interventions noted above, I reevaluated the patient and found that their symptoms have improved  Co morbidities that complicate the patient evaluation  Past Medical History:  Diagnosis Date   Abdominal hernia    CHF (congestive heart failure) (HCC)    Hypertension    Obesity    Snoring       Dispostion: Disposition decision including need for hospitalization was considered, and patient disposition pending at time of sign out.    Final Clinical Impression(s) / ED Diagnoses Final diagnoses:  Acute systolic congestive heart failure (HCC)     This chart was dictated using voice recognition  software.  Despite best efforts to proofread,  errors can occur which can change the documentation meaning.    Mordecai Applebaum, MD 10/21/23 [id number]

## 2023-10-21 NOTE — ED Provider Triage Note (Signed)
 Emergency Medicine Provider Triage Evaluation Note  Donald Murray , a 38 y.o. male  was evaluated in triage.  Pt complains of chest pain, shortness of breath, dizziness.  6 weeks out from hernia repair.  Symptoms worse with exertion.  Review of Systems  Positive:  Negative:   Physical Exam  BP (!) 165/114   Pulse (!) 130   Temp 98.1 F (36.7 C) (Oral)   Resp (!) 34   Ht 5\' 11"  (1.803 m)   Wt (!) 172.4 kg   SpO2 97%   BMI 53.00 kg/m  Gen:   Awake, no distress   Resp:  Normal effort  MSK:   Moves extremities without difficulty  Other:    Medical Decision Making  Medically screening exam initiated at 12:25 PM.  Appropriate orders placed.  Donald Murray was informed that the remainder of the evaluation will be completed by another provider, this initial triage assessment does not replace that evaluation, and the importance of remaining in the ED until their evaluation is complete.  Basic labs, EKG chest x-ray and CT PE study.  Not suitable for waiting room   Donald Murray 10/21/23 1226

## 2023-10-22 ENCOUNTER — Telehealth (HOSPITAL_COMMUNITY): Payer: Self-pay

## 2023-10-22 ENCOUNTER — Other Ambulatory Visit (HOSPITAL_COMMUNITY): Payer: Self-pay

## 2023-10-22 ENCOUNTER — Inpatient Hospital Stay (HOSPITAL_COMMUNITY): Payer: MEDICAID

## 2023-10-22 DIAGNOSIS — R0609 Other forms of dyspnea: Secondary | ICD-10-CM | POA: Diagnosis not present

## 2023-10-22 DIAGNOSIS — I5023 Acute on chronic systolic (congestive) heart failure: Secondary | ICD-10-CM | POA: Diagnosis not present

## 2023-10-22 DIAGNOSIS — R000 Tachycardia, unspecified: Secondary | ICD-10-CM | POA: Diagnosis not present

## 2023-10-22 DIAGNOSIS — I10 Essential (primary) hypertension: Secondary | ICD-10-CM | POA: Diagnosis not present

## 2023-10-22 LAB — MAGNESIUM: Magnesium: 1.9 mg/dL (ref 1.7–2.4)

## 2023-10-22 LAB — COOXEMETRY PANEL
Carboxyhemoglobin: 3 % — ABNORMAL HIGH (ref 0.5–1.5)
Carboxyhemoglobin: 3.3 % — ABNORMAL HIGH (ref 0.5–1.5)
Methemoglobin: <0.7 % (ref 0.0–1.5)
Methemoglobin: 1.4 % (ref 0.0–1.5)
O2 Saturation: 91 %
O2 Saturation: 80.1 %
Total hemoglobin: 15.3 g/dL (ref 12.0–16.0)
Total hemoglobin: 14.8 g/dL (ref 12.0–16.0)

## 2023-10-22 LAB — ECHOCARDIOGRAM COMPLETE
Area-P 1/2: 8.62 cm2
Height: 71 in
MV M vel: 3.47 m/s
MV Peak grad: 48.2 mmHg
S' Lateral: 6.5 cm
Weight: 5834.25 [oz_av]

## 2023-10-22 LAB — COMPREHENSIVE METABOLIC PANEL WITH GFR
ALT: 25 U/L (ref 0–44)
AST: 17 U/L (ref 15–41)
Albumin: 3.2 g/dL — ABNORMAL LOW (ref 3.5–5.0)
Alkaline Phosphatase: 66 U/L (ref 38–126)
Anion gap: 9 (ref 5–15)
BUN: 10 mg/dL (ref 6–20)
CO2: 26 mmol/L (ref 22–32)
Calcium: 8.8 mg/dL — ABNORMAL LOW (ref 8.9–10.3)
Chloride: 105 mmol/L (ref 98–111)
Creatinine, Ser: 1.04 mg/dL (ref 0.61–1.24)
GFR, Estimated: >60 mL/min (ref >60)
Glucose, Bld: 82 mg/dL (ref 70–99)
Potassium: 3.4 mmol/L — ABNORMAL LOW (ref 3.5–5.1)
Sodium: 140 mmol/L (ref 135–145)
Total Bilirubin: 0.7 mg/dL (ref 0.0–1.2)
Total Protein: 6 g/dL — ABNORMAL LOW (ref 6.5–8.1)

## 2023-10-22 LAB — CBC
HCT: 44.4 % (ref 39.0–52.0)
Hemoglobin: 14 g/dL (ref 13.0–17.0)
MCH: 25.9 pg — ABNORMAL LOW (ref 26.0–34.0)
MCHC: 31.5 g/dL (ref 30.0–36.0)
MCV: 82.2 fL (ref 80.0–100.0)
Platelets: 353 10*3/uL (ref 150–400)
RBC: 5.4 MIL/uL (ref 4.22–5.81)
RDW: 16.8 % — ABNORMAL HIGH (ref 11.5–15.5)
WBC: 9.2 10*3/uL (ref 4.0–10.5)
nRBC: 0 % (ref 0.0–0.2)

## 2023-10-22 MED ORDER — LOSARTAN POTASSIUM 25 MG PO TABS
25.0000 mg | ORAL_TABLET | Freq: Every day | ORAL | Status: DC
  Administered 2023-10-22 – 2023-10-23 (×2): 25 mg via ORAL
  Filled 2023-10-22 (×2): qty 1

## 2023-10-22 MED ORDER — SODIUM CHLORIDE 0.9% FLUSH: 10.0000 mL | INTRAVENOUS | Status: DC | PRN

## 2023-10-22 MED ORDER — NICOTINE 21 MG/24HR TD PT24
21.0000 mg | MEDICATED_PATCH | Freq: Every day | TRANSDERMAL | Status: DC
  Administered 2023-10-22 – 2023-10-23 (×3): 21 mg via TRANSDERMAL
  Filled 2023-10-22 (×3): qty 1

## 2023-10-22 MED ORDER — IVABRADINE 2.5 MG HALF TABLET
2.5000 mg | ORAL_TABLET | Freq: Two times a day (BID) | ORAL | Status: DC
  Administered 2023-10-22 – 2023-10-23 (×3): 2.5 mg via ORAL
  Filled 2023-10-22 (×4): qty 1

## 2023-10-22 MED ORDER — MAGNESIUM SULFATE 2 GM/50ML IV SOLN
2.0000 g | Freq: Once | INTRAVENOUS | Status: AC
  Administered 2023-10-22: 2 g via INTRAVENOUS
  Filled 2023-10-22: qty 50

## 2023-10-22 MED ORDER — PERFLUTREN LIPID MICROSPHERE
1.0000 mL | INTRAVENOUS | Status: DC | PRN
  Administered 2023-10-22: 3 mL via INTRAVENOUS

## 2023-10-22 MED ORDER — POTASSIUM CHLORIDE CRYS ER 20 MEQ PO TBCR
40.0000 meq | EXTENDED_RELEASE_TABLET | ORAL | Status: AC
  Administered 2023-10-22 (×2): 40 meq via ORAL
  Filled 2023-10-22 (×2): qty 2

## 2023-10-22 MED ORDER — SACUBITRIL-VALSARTAN 24-26 MG PO TABS: 1.0000 | ORAL_TABLET | Freq: Two times a day (BID) | ORAL | Status: DC

## 2023-10-22 MED ORDER — SODIUM CHLORIDE 0.9% FLUSH
10.0000 mL | Freq: Two times a day (BID) | INTRAVENOUS | Status: DC
  Administered 2023-10-22 – 2023-10-23 (×2): 10 mL

## 2023-10-22 MED ORDER — CHLORHEXIDINE GLUCONATE CLOTH 2 % EX PADS
6.0000 | MEDICATED_PAD | Freq: Every day | CUTANEOUS | Status: DC
  Administered 2023-10-22 – 2023-10-23 (×2): 6 via TOPICAL

## 2023-10-22 NOTE — Progress Notes (Signed)
 Peripherally Inserted Central Catheter Placement  The IV Nurse has discussed with the patient and/or persons authorized to consent for the patient, the purpose of this procedure and the potential benefits and risks involved with this procedure.  The benefits include less needle sticks, lab draws from the catheter, and the patient may be discharged home with the catheter. Risks include, but not limited to, infection, bleeding, blood clot (thrombus formation), and puncture of an artery; nerve damage and irregular heartbeat and possibility to perform a PICC exchange if needed/ordered by physician.  Alternatives to this procedure were also discussed.  Bard Power PICC patient education guide, fact sheet on infection prevention and patient information card has been provided to patient /or left at bedside.    PICC Placement Documentation  PICC Double Lumen 10/22/23 Right Basilic 47 cm 2 cm (Active)  Indication for Insertion or Continuance of Line Chronic illness with exacerbations (CF, Sickle Cell, etc.) 10/22/23 1039  Exposed Catheter (cm) 2 cm 10/22/23 1039  Site Assessment Clean, Dry, Intact 10/22/23 1039  Lumen #1 Status Flushed;Saline locked;Blood return noted 10/22/23 1039  Lumen #2 Status Flushed;Saline locked;Blood return noted 10/22/23 1039  Dressing Type Transparent;Securing device 10/22/23 1039  Dressing Status Antimicrobial disc/dressing in place 10/22/23 1039  Line Care Connections checked and tightened 10/22/23 1039  Line Adjustment (NICU/IV Team Only) No 10/22/23 1039  Dressing Intervention New dressing;Adhesive placed at insertion site (IV team only) 10/22/23 1039  Dressing Change Due 10/29/23 10/22/23 1039       Jovi Zavadil 10/22/2023, 10:40 AM

## 2023-10-22 NOTE — Progress Notes (Signed)
  Echocardiogram 2D Echocardiogram has been performed.  Farley Honer, RDCS 10/22/2023, 9:46 AM

## 2023-10-22 NOTE — TOC Initial Note (Signed)
 Transition of Care [facility]) - Initial/Assessment Note    Patient Details  Name: Donald Murray MRN: [id number] Date of Birth: Jan 26, 1986  [facility] CM/SW Contact:    Benjiman Bras, RN Phone Number: [phone] 10/22/2023, 6:14 PM  Clinical Narrative:                 HF TOC CM spoke to pt and independent pta. Provided pt with Living Better with HF booklet. Pt has scale at home. Educated on daily weights and low sodium diet. States he eats out often. Pt drives at home. Pt states he does not have PCP. CM will arrange PCP follow up appt.   Will continue to follow for dc needs.   Expected Discharge Plan: Home/Self Care Barriers to Discharge: Continued Medical Work up   Patient Goals and CMS Choice Patient states their goals for this hospitalization and ongoing recovery are:: wants to remain independent          Expected Discharge Plan and Services   Discharge Planning Services: CM Consult   Living arrangements for the past 2 months: Single Family Home                                      Prior Living Arrangements/Services Living arrangements for the past 2 months: Single Family Home Lives with:: Roommate Patient language and need for interpreter reviewed:: Yes Do you feel safe going back to the place where you live?: Yes      Need for Family Participation in Patient Care: No (Comment) Care giver support system in place?: No (comment)   Criminal Activity/Legal Involvement Pertinent to Current Situation/Hospitalization: No - Comment as needed  Activities of Daily Living      Permission Sought/Granted Permission sought to share information with : Case Manager, PCP, Family Supports Permission granted to share information with : Yes, Verbal Permission Granted  Share Information with NAME: Timmothy Foots     Permission granted to share info w Relationship: friend  Permission granted to share info w Contact Information: [phone]  Emotional  Assessment Appearance:: Appears stated age Attitude/Demeanor/Rapport: Engaged Affect (typically observed): Accepting Orientation: : Oriented to Self, Oriented to Place, Oriented to  Time, Oriented to Situation   Psych Involvement: No (comment)  Admission diagnosis:  Acute systolic congestive heart failure (HCC) [I50.21] Acute on chronic systolic (congestive) heart failure (HCC) [I50.23] Patient Active Problem List   Diagnosis Date Noted   Severe obesity (BMI >= 40) (HCC) 10/22/2023   Acute on chronic systolic (congestive) heart failure (HCC) 10/21/2023   Ventral hernia 09/02/2023   OSA (obstructive sleep apnea) 09/29/2021   Acute on chronic systolic heart failure (HCC) 05/21/2021   Acute on chronic heart failure (HCC) 12/29/2020   PVC (premature ventricular contraction) 08/15/2020   Essential hypertension 03/20/2020   Acute congestive heart failure (HCC)    Cellulitis 03/04/2019   IVDU (intravenous drug user) 03/04/2019   PCP:  Patient, [name] Pharmacy:   [facility] [address] [place] [address] Phone: [phone] Fax: [phone]  [facility] [address] [place] [address] Phone: [phone] Fax: [phone]  [facility] [address] [place] [address] Phone: [phone] Fax: [phone]  [facility] [address] [place] [address] Phone: [phone] Fax: [phone]  [id number] Jonette Nestle, [place] - [address] [facility] [address] [place] [address] Phone: [phone] Fax: [phone]     Social Drivers of Health (SDOH) Social History: SDOH Screenings   Food Insecurity: No Food Insecurity (09/02/2023)  Housing: Low Risk  (09/02/2023)  Transportation Needs: No Transportation Needs (09/02/2023)  Utilities:  Not At Risk (09/02/2023)  Financial Resource Strain: High Risk (04/08/2022)  Tobacco Use: High Risk (10/21/2023)   SDOH Interventions:     Readmission Risk Interventions     No data to display

## 2023-10-22 NOTE — Telephone Encounter (Signed)
 Pharmacy Patient Advocate Encounter  Insurance verification completed.    The patient is insured through [employer] [place].     Ran test claim for Entresto  and the current 30 day co-pay is $4.00.  Ran test claim for Ivabradine  and the current 30 day co-pay is $4.00.  This test claim was processed through [facility]- copay amounts may vary at other pharmacies due to pharmacy/plan contracts, or as the patient moves through the different stages of their insurance plan.

## 2023-10-22 NOTE — Progress Notes (Signed)
 Heart Failure Navigator Progress Note  Assessed for Heart & Vascular TOC clinic readiness.  Patient does not meet criteria due to Advanced Heart Failure Team patient of Dr. Gasper Lloyd.   Navigator will sign off at this time.   Rhae Hammock, BSN, Scientist, clinical (histocompatibility and immunogenetics) Only

## 2023-10-22 NOTE — Hospital Course (Signed)
 38 y.o. M with hypertensive CM, sCHF EF 20--25%, morbid obesity, IVDU/heroin in remission, and smoking who presented with CHF flare.

## 2023-10-22 NOTE — Plan of Care (Signed)
  Problem: Health Behavior/Discharge Planning: Goal: Ability to manage health-related needs will improve Outcome: Progressing   Problem: Education: Goal: Knowledge of General Education information will improve Description: Including pain rating scale, medication(s)/side effects and non-pharmacologic comfort measures Outcome: Progressing   Problem: Clinical Measurements: Goal: Ability to maintain clinical measurements within normal limits will improve Outcome: Progressing Goal: Will remain free from infection Outcome: Progressing Goal: Diagnostic test results will improve Outcome: Progressing Goal: Respiratory complications will improve Outcome: Progressing Goal: Cardiovascular complication will be avoided Outcome: Progressing   Problem: Cardiac: Goal: Ability to achieve and maintain adequate cardiopulmonary perfusion will improve Outcome: Progressing   Problem: Activity: Goal: Capacity to carry out activities will improve Outcome: Progressing

## 2023-10-22 NOTE — Progress Notes (Signed)
 Advanced Heart Failure Rounding Note  Cardiologist: Alexandria Angel, MD  Chief Complaint: A/C HFrEF Subjective:    Feels ok this morning. Breathing has improved. I&O not documented. Awaiting PICC placement today.   SCr 0.98>1.04  K 3.4  Objective:    (!) 165.4 kg Body mass index is 50.86 kg/m.   Vital Signs:   Temp:  [97.8 F (36.6 C)-98.3 F (36.8 C)] 97.8 F (36.6 C) (05/30 0759) Pulse Rate:  [37-130] 90 (05/30 0839) Resp:  [12-59] 18 (05/30 0759) BP: (92-165)/(62-114) 130/100 (05/30 0846) SpO2:  [94 %-100 %] 94 % (05/30 0759) Weight:  [165.4 kg-172.4 kg] 165.4 kg (05/30 0243) Last BM Date :  (PTA)  Weight change: Filed Weights   10/21/23 1216 10/21/23 2150 10/22/23 0243  Weight: (!) 172.4 kg (!) 165.4 kg (!) 165.4 kg    Intake/Output:   Intake/Output Summary (Last 24 hours) at 10/22/2023 1146 Last data filed at 10/22/2023 1100 Gross per 24 hour  Intake 475 ml  Output 1600 ml  Net -1125 ml      Physical Exam    General:  well appearing.  No respiratory difficulty Neck: supple. JVD ~10 cm.  Cor: PMI nondisplaced. Regular rate & rhythm. No rubs, gallops or murmurs. Lungs: clear Extremities: no cyanosis, clubbing, rash, trace BLE edema  Neuro: alert & oriented x 3. Moves all 4 extremities w/o difficulty. Affect pleasant.   Telemetry   NSR 90s (Personally reviewed)    EKG    No new EKG to review  Labs    CBC Recent Labs    10/21/23 1223 10/22/23 0323  WBC 10.8* 9.2  HGB 14.9 14.0  HCT 48.3 44.4  MCV 85.0 82.2  PLT 402* 353   Basic Metabolic Panel Recent Labs    96/04/54 1223 10/22/23 0323  NA 139 140  K 3.9 3.4*  CL 108 105  CO2 22 26  GLUCOSE 106* 82  BUN 11 10  CREATININE 0.98 1.04  CALCIUM 8.8* 8.8*  MG  --  1.9   Liver Function Tests Recent Labs    10/22/23 0323  AST 17  ALT 25  ALKPHOS 66  BILITOT 0.7  PROT 6.0*  ALBUMIN 3.2*   No results for input(s): "LIPASE", "AMYLASE" in the last 72 hours. Cardiac  Enzymes No results for input(s): "CKTOTAL", "CKMB", "CKMBINDEX", "TROPONINI" in the last 72 hours.  BNP: BNP (last 3 results) Recent Labs    08/06/23 1153 09/14/23 1342 10/21/23 1223  BNP 124.6* 346.4* 403.9*    ProBNP (last 3 results) No results for input(s): "PROBNP" in the last 8760 hours.   D-Dimer No results for input(s): "DDIMER" in the last 72 hours. Hemoglobin A1C Recent Labs    10/21/23 1647  HGBA1C 4.8   Fasting Lipid Panel No results for input(s): "CHOL", "HDL", "LDLCALC", "TRIG", "CHOLHDL", "LDLDIRECT" in the last 72 hours. Thyroid Function Tests Recent Labs    10/21/23 1647  TSH 1.582    Other results:   Imaging    CT Angio Chest PE W and/or Wo Contrast Result Date: 10/21/2023 CLINICAL DATA:  Pulmonary embolism (PE) suspected, high prob Shortness of breath. EXAM: CT ANGIOGRAPHY CHEST WITH CONTRAST TECHNIQUE: Multidetector CT imaging of the chest was performed using the standard protocol during bolus administration of intravenous contrast. Multiplanar CT image reconstructions and MIPs were obtained to evaluate the vascular anatomy. RADIATION DOSE REDUCTION: This exam was performed according to the departmental dose-optimization program which includes automated exposure control, adjustment of the mA and/or kV  according to patient size and/or use of iterative reconstruction technique. CONTRAST:  75mL OMNIPAQUE  IOHEXOL  350 MG/ML SOLN COMPARISON:  Radiograph earlier today.  Chest CT 08/15/2020 FINDINGS: Cardiovascular: There are no filling defects within the pulmonary arteries to suggest pulmonary embolus. The main pulmonary artery is dilated at 3.8 cm. The heart is enlarged. No pericardial effusion. Normal caliber thoracic aorta. Mediastinum/Nodes: Stable prominent bilateral hilar lymph nodes, up to 11 mm on the right series 7, image 82. No change from 2022 exam. Scattered small mediastinal nodes are not enlarged by size criteria. Decompressed esophagus.  Lungs/Pleura: Small bilateral pleural effusions, right greater than left. Mild diffuse ground-glass and septal thickening suspicious for pulmonary edema. Small ground-glass nodules in the left upper lobe, series 8, images 19, 22, 28. Mild bronchial thickening is likely congestive. Upper Abdomen: Hepatic steatosis. Slightly lobulated hepatic contours. No acute upper abdominal findings. Musculoskeletal: There are no acute or suspicious osseous abnormalities. Review of the MIP images confirms the above findings. IMPRESSION: 1. No pulmonary embolus. 2. Cardiomegaly with small bilateral pleural effusions, right greater than left. Mild diffuse ground-glass and septal thickening suspicious for pulmonary edema. 3. Small ground-glass nodules in the left upper lobe, likely infectious or inflammatory. 4. Dilated main pulmonary artery, can be seen with pulmonary arterial hypertension. 5. Hepatic steatosis. Slightly lobulated hepatic contours, can be seen with cirrhosis. Electronically Signed   By: Chadwick Colonel M.D.   On: 10/21/2023 18:33   US  EKG SITE RITE Result Date: 10/21/2023 If Site Rite image not attached, placement could not be confirmed due to current cardiac rhythm.  DG Chest 2 View Result Date: 10/21/2023 CLINICAL DATA:  Shortness of breath. EXAM: CHEST - 2 VIEW COMPARISON:  07/06/2022 FINDINGS: Stable enlarged cardiac silhouette. Mildly prominent pulmonary vasculature and interstitial markings with improvement. Interval mild-to-moderate peribronchial thickening. Unremarkable bones. IMPRESSION: 1. Interval mild-to-moderate bronchitic changes. 2. Mild pulmonary venous congestion and minimal interstitial edema/chronic interstitial lung disease with improvement. Electronically Signed   By: Catherin Closs M.D.   On: 10/21/2023 14:33     Medications:     Scheduled Medications:  Chlorhexidine  Gluconate Cloth  6 each Topical Daily   digoxin   0.125 mg Oral Daily   enoxaparin  (LOVENOX ) injection  80 mg  Subcutaneous Q24H   furosemide   80 mg Intravenous BID   ivabradine   2.5 mg Oral BID WC   losartan   25 mg Oral Daily   potassium chloride   40 mEq Oral Q4H   sodium chloride  flush  10-40 mL Intracatheter Q12H   spironolactone   25 mg Oral Daily    Infusions:   PRN Medications: acetaminophen  **OR** acetaminophen , ondansetron  **OR** ondansetron  (ZOFRAN ) IV, oxyCODONE , senna-docusate, sodium chloride  flush    Patient Profile   Donald Murray is a morbidly obese 38 y.o. male with HTN, previous IVDA (heroin), tobacco use and systolic HF. Admitted with A/C HFrEF.   Assessment/Plan   1. Acute on chronic systolic HF - Echo 9/21 EF 20-25% RV moderately HK. Suspect HTN vs PVC-mediated (PVC burden 4.3% - probably not high enough to cause CM) - LHC 1/23 no CAD EF < 10% - CPX 2/23 pVO2: 21.6 (87% predicted peak VO2) corrected for ibw pVO2 41.6 ml/kg (ibw)/min (99% of the ibw-adjusted predicted). Slope:45 O2pulse:  19 (90% pred). Test reassuring VO2 but slope high  - Echo 5/23, 2/24, and 11/24: EF 20-25% RV moderately HK - NYHA IV on admission, limited by body habitus and back pain.  - Continue 80 IV lasix . Will better gauge volume replacement once CVP  hooked up.  - Discussed PICC placement with patient and he agrees to proceed. Would help us  better gauge fluid and follow co-ox, suspect low-output HF. May need inotrope support. (Pending placement today) - Check CVP once placed.  - Hold BB with low-output HF. - Continue Digoxin  0.125 mg daily. Dig level 0.4 10/21/23 - Continue losartan  25 mg daily, hold off on Entresto  today with soft BP - Continue Spiro 25 mg daily. - Decrease Ivabradine  7.5>2.5 mg bid - SGLT2i held recently with surgery and seroma. Was going to restart pending surgery f/u. - Undecided about ICD. Keeps having scheduling issues with Dr. Doyle [facility] office.  - Has previously been seen and followed in VAD clinic but ended up symptomatically improving and he didn't want to go through  with LVAD at that time. Discussed again today and he is open to talking about it again. He wants to work on improving symptoms first but is more open to following through with LVAD if necessary. Suspect he is still low output and may require inotrope support to diurese. Will need PFTs and further workup.  - Strict I&O, daily weights    2. Chest Pain - Denies CP on assessment - HsTrop flat, suspect 2/2 congestion - EKG with no acute changed just ST - LHC with clean coronaries 1/23 - No further chest pain   3. HTN - BP slightly elevated, has not taken medications this am - Continue meds as above   4. Sinus Tach  - HR improving in 90s  - Holding BB - Decrease Ivabradine  7.5>2.5.  - TSH WNL - Continue digoxin  0.125 mg daily.    5.  H/O Frequent PVCs - Zio 12/21 4.3% PVCs - probably not high enough burden to cause CM  - Has severe OSA, unable to afford CPAP - None seen on tele review   6.  Morbid obesity - Body mass index is 50.86 kg/m.  - On WeGovy , has not had meaningful weight loss. Weight actually has continued to climb. 35-40 lbs up in 2 years.  - Consider switch to tirzepatide OP.   7. OSA, Severe - Not on CPAP yet.  - Cannot afford. Engage HFSW for resources.   8.  Tobacco use - Smoking 1 ppd - Discussed smoking cessation   9. H/O IVDA (heroin) - Last used 2020  - UDS + for opiates this admit, has prescription   Length of Stay: 1  Sheryl Donna, NP  10/22/2023, 11:46 AM  Advanced Heart Failure Team Pager [phone] (M-F; 7a - 5p)  Please contact [facility] for night-coverage after hours (5p -7a ) and weekends on [email or web address]

## 2023-10-22 NOTE — Progress Notes (Signed)
  Progress Note   Patient: Donald Murray [name] DOB: May 07, 1986 DOA: 10/21/2023     1 DOS: the patient was seen and examined on 10/22/2023        Brief hospital course: 38 y.o. M with hypertensive CM, sCHF EF 20--25%, morbid obesity, IVDU/heroin in remission, and smoking who presented with CHF flare.     Assessment and Plan: Acute on chronic systolic congestive heart failure Hypertension - Digoxin , furosemide , Corlanor , losartan , spironolactone  per cardiology - LVAD, inotropes, CVP, heart cath per cardiology  Morbid obesity BMI 50.8, complicates care, class III.  On Wegovy .  Sleep apnea Not on CPAP  Hypokalemia - Supplement potassium  Myocardial injury due to CHF No chest pain or ischemic changes to ECG, ischemia ruled out.      Subjective: Patient appears to be minimizing symptoms.  He reports no specific complaints.  Lying on his side.  No chest pain, no respiratory distress.     Physical Exam: BP 104/76 (BP Location: Left Arm)   Pulse 86   Temp 98.5 F (36.9 C) (Oral)   Resp 16   Ht 5\' 11"  (1.803 m)   Wt (!) 165.4 kg   SpO2 97%   BMI 50.86 kg/m   Obese adult male, lying in bed, arouses, responsive to questions, goes back to sleep RRR, no murmurs that I can appreciate although heart sounds are difficult to auscultate given body habitus Respiratory effort seems normal, lung sounds diminished due to body habitus No peripheral pitting in the lower extremities Attention normal, affect normal, judgment and insight appear normal, symmetric strength    Data Reviewed: Basic metabolic panel shows mild hypokalemia CBC unremarkable  Family Communication: None    Disposition: Status is: Inpatient         Author: Ephriam Hashimoto, MD 10/22/2023 2:42 PM  For on call review [email or web address].

## 2023-10-23 ENCOUNTER — Other Ambulatory Visit (HOSPITAL_COMMUNITY): Payer: Self-pay

## 2023-10-23 DIAGNOSIS — I5023 Acute on chronic systolic (congestive) heart failure: Secondary | ICD-10-CM | POA: Diagnosis not present

## 2023-10-23 LAB — BASIC METABOLIC PANEL WITH GFR
Anion gap: 9 (ref 5–15)
BUN: 14 mg/dL (ref 6–20)
CO2: 28 mmol/L (ref 22–32)
Calcium: 9 mg/dL (ref 8.9–10.3)
Chloride: 100 mmol/L (ref 98–111)
Creatinine, Ser: 0.95 mg/dL (ref 0.61–1.24)
GFR, Estimated: >60 mL/min (ref >60)
Glucose, Bld: 100 mg/dL — ABNORMAL HIGH (ref 70–99)
Potassium: 3.3 mmol/L — ABNORMAL LOW (ref 3.5–5.1)
Sodium: 137 mmol/L (ref 135–145)

## 2023-10-23 LAB — CBC
HCT: 44.5 % (ref 39.0–52.0)
Hemoglobin: 14.5 g/dL (ref 13.0–17.0)
MCH: 26.2 pg (ref 26.0–34.0)
MCHC: 32.6 g/dL (ref 30.0–36.0)
MCV: 80.3 fL (ref 80.0–100.0)
Platelets: 315 10*3/uL (ref 150–400)
RBC: 5.54 MIL/uL (ref 4.22–5.81)
RDW: 16.4 % — ABNORMAL HIGH (ref 11.5–15.5)
WBC: 9 10*3/uL (ref 4.0–10.5)
nRBC: 0 % (ref 0.0–0.2)

## 2023-10-23 LAB — COOXEMETRY PANEL
Carboxyhemoglobin: 1.7 % — ABNORMAL HIGH (ref 0.5–1.5)
Methemoglobin: <0.7 % (ref 0.0–1.5)
O2 Saturation: 63.6 %
Total hemoglobin: 14.9 g/dL (ref 12.0–16.0)

## 2023-10-23 LAB — MAGNESIUM: Magnesium: 2.2 mg/dL (ref 1.7–2.4)

## 2023-10-23 MED ORDER — SPIRONOLACTONE 25 MG PO TABS
25.0000 mg | ORAL_TABLET | Freq: Every day | ORAL | 3 refills | Status: DC
  Filled 2023-10-23: qty 90, 90d supply, fill #0

## 2023-10-23 MED ORDER — TORSEMIDE 20 MG PO TABS
80.0000 mg | ORAL_TABLET | Freq: Every day | ORAL | 0 refills | Status: DC
  Filled 2023-10-23: qty 120, 30d supply, fill #0

## 2023-10-23 MED ORDER — IVABRADINE HCL 7.5 MG PO TABS
7.5000 mg | ORAL_TABLET | Freq: Two times a day (BID) | ORAL | 11 refills | Status: DC
  Filled 2023-10-23: qty 60, 30d supply, fill #0

## 2023-10-23 MED ORDER — POTASSIUM CHLORIDE CRYS ER 20 MEQ PO TBCR
40.0000 meq | EXTENDED_RELEASE_TABLET | ORAL | Status: AC
  Administered 2023-10-23 (×2): 40 meq via ORAL
  Filled 2023-10-23 (×2): qty 2

## 2023-10-23 MED ORDER — DIGOXIN 125 MCG PO TABS
0.1250 mg | ORAL_TABLET | Freq: Every day | ORAL | 3 refills | Status: DC
Start: 2023-10-23 — End: 2024-03-31
  Filled 2023-10-23 – 2023-12-01 (×4): qty 90, 90d supply, fill #0

## 2023-10-23 NOTE — Discharge Summary (Signed)
 [facility] Discharge Summary   Patient: Donald Murray [name]   PCP: Patient, [name] DOB: [birth year]   Date of admission: 10/21/2023   Date of discharge: 10/23/2023      Discharge Diagnoses:  Principal Problem:   Acute on chronic systolic (congestive) heart failure (HCC) Active Problems:   Severe obesity (BMI >= 40) (HCC)   Sleep apnea   Hypokalemia   Myocardial injury due to CHF     Discharge Condition: fair    History of present illness:  38 y.o. M with hypertensive CM, sCHF EF 20-25%, obesity, prior heroin use in remission, smoking who presented with CHF flare.  Advanced CHF team consulted, PICC placed. Patient diuresed 10kg, felt better.  Requested to leave.  I spent 25 minutes with patient discussing his heart disease, residual uncertainty about whether he was now stably compensated or not, and the intended plan for RHC, possible LVAD work up.  He reported he felt better, was confident this episode was resolved, and he would be fine if he just took his medicines.  He acknowledged the uncertainty that he would be okay, and acknowledged the severity of his HF, including risk of worsening and death.    He wasn't sure he had refills of his medicines, and so one time refills were sent to in-house pharmacy.  He reported he would call HF team on Monday.        Patient left the hospital AMA.        The results of significant diagnostics from this hospitalization (including imaging, microbiology, ancillary and laboratory) are listed below for reference.    Significant Diagnostic Studies: ECHOCARDIOGRAM COMPLETE Result Date: 10/22/2023    ECHOCARDIOGRAM REPORT   Patient Name:   Donald Murray Date of Exam: 10/22/2023 Medical Rec #:  [id number]     Height:       71.0 in Accession #:    [id number]    Weight:       364.6 lb Date of Birth:  11-Aug-1985    BSA:          2.722 m Patient Age:    38 years      BP:           130/100 mmHg Patient Gender: M              HR:           101 bpm. Exam Location:  Inpatient Procedure: 2D Echo, Cardiac Doppler, Color Doppler and Intracardiac            Opacification Agent (Both Spectral and Color Flow Doppler were            utilized during procedure). Indications:    Dyspea  History:        Patient has prior history of Echocardiogram examinations, most                 recent 03/30/2023. CHF; Risk Factors:Hypertension, Sleep Apnea                 and Current Smoker.  Sonographer:    Kip Peon RDCS Referring Phys: [id number] PROSPER M AMPONSAH  Sonographer Comments: Suboptimal apical window. Image acquisition challenging due to patient body habitus. IMPRESSIONS  1. Left ventricular ejection fraction, by estimation, is 20 to 25%. The left ventricle has severely decreased function. The left ventricle demonstrates global hypokinesis. The left ventricular internal cavity size was severely dilated. Left ventricular diastolic parameters are consistent with Grade III diastolic dysfunction (restrictive).  2. Right ventricular systolic function is moderately reduced. The right ventricular size is not well visualized. Tricuspid regurgitation signal is inadequate for assessing PA pressure.  3. The mitral valve is normal in structure. Mild mitral valve regurgitation. No evidence of mitral stenosis.  4. The aortic valve is normal in structure. Aortic valve regurgitation is not visualized. No aortic stenosis is present.  5. The inferior vena cava is normal in size with greater than 50% respiratory variability, suggesting right atrial pressure of 3 mmHg. FINDINGS  Left Ventricle: Left ventricular ejection fraction, by estimation, is 20 to 25%. The left ventricle has severely decreased function. The left ventricle demonstrates global hypokinesis. Definity  contrast agent was given IV to delineate the left ventricular endocardial borders. The left ventricular internal cavity size was severely dilated. There is no left ventricular hypertrophy. Left  ventricular diastolic parameters are consistent with Grade III diastolic dysfunction (restrictive). Right Ventricle: The right ventricular size is not well visualized. Right vetricular wall thickness was not well visualized. Right ventricular systolic function is moderately reduced. Tricuspid regurgitation signal is inadequate for assessing PA pressure. Left Atrium: Left atrial size was normal in size. Right Atrium: Right atrial size was normal in size. Pericardium: There is no evidence of pericardial effusion. Mitral Valve: The mitral valve is normal in structure. Mild mitral valve regurgitation. No evidence of mitral valve stenosis. Tricuspid Valve: The tricuspid valve is normal in structure. Tricuspid valve regurgitation is not demonstrated. No evidence of tricuspid stenosis. Aortic Valve: The aortic valve is normal in structure. Aortic valve regurgitation is not visualized. No aortic stenosis is present. Pulmonic Valve: The pulmonic valve was normal in structure. Pulmonic valve regurgitation is not visualized. No evidence of pulmonic stenosis. Aorta: The aortic root is normal in size and structure. Venous: The inferior vena cava is normal in size with greater than 50% respiratory variability, suggesting right atrial pressure of 3 mmHg. IAS/Shunts: No atrial level shunt detected by color flow Doppler.  LEFT VENTRICLE PLAX 2D LVIDd:         7.20 cm   Diastology LVIDs:         6.50 cm   LV e' medial:    4.56 cm/s LV PW:         1.20 cm   LV E/e' medial:  26.3 LV IVS:        1.30 cm   LV e' lateral:   10.40 cm/s LVOT diam:     2.60 cm   LV E/e' lateral: 11.5 LV SV:         40 LV SV Index:   15 LVOT Area:     5.31 cm  IVC IVC diam: 1.80 cm LEFT ATRIUM           Index LA diam:      5.40 cm 1.98 cm/m LA Vol (A4C): 69.7 ml 25.61 ml/m  AORTIC VALVE LVOT Vmax:   46.00 cm/s LVOT Vmean:  28.300 cm/s LVOT VTI:    0.074 m  AORTA Ao Root diam: 3.40 cm Ao Asc diam:  3.50 cm MITRAL VALVE MV Area (PHT): 8.62 cm     SHUNTS MV  Decel Time: 88 msec      Systemic VTI:  0.07 m MR Peak grad: 48.2 mmHg     Systemic Diam: 2.60 cm MR Vmax:      347.00 cm/s MV E velocity: 120.00 cm/s MV A velocity: 31.00 cm/s MV E/A ratio:  3.87 Arta Lark Electronically signed by Arta Lark Signature Date/Time: 10/22/2023/12:50:50 PM  Final    CT Angio Chest PE W and/or Wo Contrast Result Date: 10/21/2023 CLINICAL DATA:  Pulmonary embolism (PE) suspected, high prob Shortness of breath. EXAM: CT ANGIOGRAPHY CHEST WITH CONTRAST TECHNIQUE: Multidetector CT imaging of the chest was performed using the standard protocol during bolus administration of intravenous contrast. Multiplanar CT image reconstructions and MIPs were obtained to evaluate the vascular anatomy. RADIATION DOSE REDUCTION: This exam was performed according to the departmental dose-optimization program which includes automated exposure control, adjustment of the mA and/or kV according to patient size and/or use of iterative reconstruction technique. CONTRAST:  75mL OMNIPAQUE  IOHEXOL  350 MG/ML SOLN COMPARISON:  Radiograph earlier today.  Chest CT 08/15/2020 FINDINGS: Cardiovascular: There are no filling defects within the pulmonary arteries to suggest pulmonary embolus. The main pulmonary artery is dilated at 3.8 cm. The heart is enlarged. No pericardial effusion. Normal caliber thoracic aorta. Mediastinum/Nodes: Stable prominent bilateral hilar lymph nodes, up to 11 mm on the right series 7, image 82. No change from 2022 exam. Scattered small mediastinal nodes are not enlarged by size criteria. Decompressed esophagus. Lungs/Pleura: Small bilateral pleural effusions, right greater than left. Mild diffuse ground-glass and septal thickening suspicious for pulmonary edema. Small ground-glass nodules in the left upper lobe, series 8, images 19, 22, 28. Mild bronchial thickening is likely congestive. Upper Abdomen: Hepatic steatosis. Slightly lobulated hepatic contours. No acute upper abdominal  findings. Musculoskeletal: There are no acute or suspicious osseous abnormalities. Review of the MIP images confirms the above findings. IMPRESSION: 1. No pulmonary embolus. 2. Cardiomegaly with small bilateral pleural effusions, right greater than left. Mild diffuse ground-glass and septal thickening suspicious for pulmonary edema. 3. Small ground-glass nodules in the left upper lobe, likely infectious or inflammatory. 4. Dilated main pulmonary artery, can be seen with pulmonary arterial hypertension. 5. Hepatic steatosis. Slightly lobulated hepatic contours, can be seen with cirrhosis. Electronically Signed   By: Chadwick Colonel M.D.   On: 10/21/2023 18:33   US  EKG SITE RITE Result Date: 10/21/2023 If Site Rite image not attached, placement could not be confirmed due to current cardiac rhythm.  DG Chest 2 View Result Date: 10/21/2023 CLINICAL DATA:  Shortness of breath. EXAM: CHEST - 2 VIEW COMPARISON:  07/06/2022 FINDINGS: Stable enlarged cardiac silhouette. Mildly prominent pulmonary vasculature and interstitial markings with improvement. Interval mild-to-moderate peribronchial thickening. Unremarkable bones. IMPRESSION: 1. Interval mild-to-moderate bronchitic changes. 2. Mild pulmonary venous congestion and minimal interstitial edema/chronic interstitial lung disease with improvement. Electronically Signed   By: Catherin Closs M.D.   On: 10/21/2023 14:33   CT ABDOMEN PELVIS WO CONTRAST Result Date: 10/13/2023 CLINICAL DATA:  Umbilical hernia repair 1 month ago.  Pain. EXAM: CT ABDOMEN AND PELVIS WITHOUT CONTRAST TECHNIQUE: Multidetector CT imaging of the abdomen and pelvis was performed following the standard protocol without IV contrast. RADIATION DOSE REDUCTION: This exam was performed according to the departmental dose-optimization program which includes automated exposure control, adjustment of the mA and/or kV according to patient size and/or use of iterative reconstruction technique. COMPARISON:   CT abdomen and pelvis 09/19/2023 FINDINGS: Lower chest: There are trace bilateral pleural effusions. The heart appears mildly enlarged. Hepatobiliary: The liver is mildly enlarged. No focal liver lesions are seen. Gallbladder and bile ducts are within normal limits. Pancreas: Unremarkable. No pancreatic ductal dilatation or surrounding inflammatory changes. Spleen: Normal in size without focal abnormality. Adrenals/Urinary Tract: Adrenal glands are unremarkable. Kidneys are normal, without renal calculi, focal lesion, or hydronephrosis. Bladder is unremarkable. Stomach/Bowel: Stomach is within normal limits. Appendix  is not visualized. No evidence of bowel wall thickening, distention, or inflammatory changes. Scattered colonic diverticula are present. Vascular/Lymphatic: No significant vascular findings are present. No enlarged abdominal or pelvic lymph nodes. Reproductive: Prostate is unremarkable. Other: There are small fat containing bilateral inguinal hernias. There is no ascites. There is diffuse anterior subcutaneous body wall edema. Patient is status post ventral hernia repair. Intramuscular abdominal wall fluid collection the level of the umbilicus measures 1.9 x 5.7 by 1.7 cm and appears similar to prior. Focal areas of in capsulated subcutaneous abdominal wall edema are again seen at the level of the mid abdomen. Left-sided area measures 4.6 x 8.5 cm. Right-sided area measures 6.5 x 3.6 cm. These 2 areas have minimally decreased in size. There is a stable small fluid collection just left of midline measures 2.1 x 2.9 cm. No new fluid collections are seen. No soft tissue gas. Musculoskeletal: No acute or significant osseous findings. IMPRESSION: 1. Status post ventral hernia repair. 2. Stable intramuscular abdominal wall fluid collection at the level of the umbilicus. 3. Minimal decrease in size of the 2 focal areas of in capsulated subcutaneous abdominal wall edema at the level of the mid abdomen. 4.  Stable small subcutaneous fluid collection just left of midline. 5. Diffuse anterior subcutaneous body wall edema. Correlate clinically for cellulitis. No soft tissue and. 6. Trace bilateral pleural effusions. 7. Mild hepatomegaly. 8. Colonic diverticulosis. 9. Small fat containing bilateral inguinal hernias. Electronically Signed   By: Tyron Gallon M.D.   On: 10/13/2023 22:44    Microbiology: No results found for this or any previous visit (from the past 240 hours).   Labs: CBC: Recent Labs  Lab 10/21/23 1223 10/22/23 0323 10/23/23 0400  WBC 10.8* 9.2 9.0  HGB 14.9 14.0 14.5  HCT 48.3 44.4 44.5  MCV 85.0 82.2 80.3  PLT 402* 353 315   Basic Metabolic Panel: Recent Labs  Lab 10/21/23 1223 10/22/23 0323 10/23/23 0400  NA 139 140 137  K 3.9 3.4* 3.3*  CL 108 105 100  CO2 22 26 28   GLUCOSE 106* 82 100*  BUN 11 10 14   CREATININE 0.98 1.04 0.95  CALCIUM 8.8* 8.8* 9.0  MG  --  1.9 2.2   Liver Function Tests: Recent Labs  Lab 10/22/23 0323  AST 17  ALT 25  ALKPHOS 66  BILITOT 0.7  PROT 6.0*  ALBUMIN 3.2*   No results for input(s): "LIPASE", "AMYLASE" in the last 168 hours. No results for input(s): "AMMONIA" in the last 168 hours. Cardiac Enzymes: No results for input(s): "CKTOTAL", "CKMB", "CKMBINDEX", "TROPONINI" in the last 168 hours. BNP (last 3 results) Recent Labs    08/06/23 1153 09/14/23 1342 10/21/23 1223  BNP 124.6* 346.4* 403.9*   CBG: No results for input(s): "GLUCAP" in the last 168 hours. Time spent: 25 additional minutes  Signed:  Willis Harter Tensley Wery  [facility] 10/23/2023, 4:00 PM

## 2023-10-23 NOTE — Progress Notes (Signed)
  Progress Note   Patient: Donald Murray [name] DOB: 1985-07-24 DOA: 10/21/2023     2 DOS: the patient was seen and examined on 10/23/2023        Brief hospital course: 38 y.o. M with hypertensive CM, sCHF EF 20--25%, morbid obesity, IVDU/heroin in remission, and smoking who presented with CHF flare.     Assessment and Plan: Acute on chronic systolic congestive heart failure Hypertension I/Os appear incomplete, weight down 10Kg since admit. - Digoxin , furosemide , Corlanor , losartan , spironolactone  per cardiology - LVAD, inotropes, CVP, heart cath per cardiology  Morbid obesity BMI 50.8, complicates care, class III.  On Wegovy .  Sleep apnea Not on CPAP  Hypokalemia - Supplement potassium  Myocardial injury due to CHF No chest pain or ischemic changes to ECG, ischemia ruled out.      Subjective: Wants to take a shower.  Asking when he can go home.       Physical Exam: BP 119/61 (BP Location: Left Arm)   Pulse 99   Temp 98.6 F (37 C) (Oral)   Resp 18   Ht 5\' 11"  (1.803 m)   Wt (!) 162.1 kg   SpO2 98%   BMI 49.84 kg/m   Adult male, lying in bed, appears comfortable. RRR, no murmurs that I can appreciate, auscultation limited Respiratory rate normal, lungs sound diminished due to habitus, but no rales appreciated Neurologic grossly normal   Data Reviewed: Creatinine stable on basic metabolic panel, mild hypokalemia persists Discussed with cardiology  Family Communication: None    Disposition: Status is: Inpatient         Author: Ephriam Hashimoto, MD 10/23/2023 3:09 PM  For on call review [email or web address].

## 2023-10-23 NOTE — Progress Notes (Cosign Needed)
 Patient is ready to leave against medical advice. Refused IV lasix  and want other meds.

## 2023-10-23 NOTE — Progress Notes (Signed)
 Rounding Note   Patient Name: Donald Murray Date of Encounter: 10/23/2023  [facility] Cardiologist: Alexandria Angel, MD   Subjective SOB improving.   Scheduled Meds:  Chlorhexidine  Gluconate Cloth  6 each Topical Daily   digoxin   0.125 mg Oral Daily   enoxaparin  (LOVENOX ) injection  80 mg Subcutaneous Q24H   furosemide   80 mg Intravenous BID   ivabradine   2.5 mg Oral BID WC   losartan   25 mg Oral Daily   nicotine   21 mg Transdermal Daily   potassium chloride   40 mEq Oral Q4H   sodium chloride  flush  10-40 mL Intracatheter Q12H   spironolactone   25 mg Oral Daily   Continuous Infusions:  PRN Meds: acetaminophen  **OR** acetaminophen , ondansetron  **OR** ondansetron  (ZOFRAN ) IV, oxyCODONE , senna-docusate, sodium chloride  flush   Vital Signs  Vitals:   10/23/23 0728 10/23/23 0902 10/23/23 1153 10/23/23 1200  BP: 97/69  119/61   Pulse: 87 96 90 99  Resp: (!) 25  (!) 21 18  Temp: 97.9 F (36.6 C)  98.6 F (37 C)   TempSrc: Oral  Oral   SpO2:   96% 98%  Weight:      Height:        Intake/Output Summary (Last 24 hours) at 10/23/2023 1401 Last data filed at 10/23/2023 1156 Gross per 24 hour  Intake 240 ml  Output 4750 ml  Net -4510 ml      10/23/2023    6:14 AM 10/22/2023    2:43 AM 10/21/2023    9:50 PM  Last 3 Weights  Weight (lbs) 357 lb 5.9 oz 364 lb 10.3 oz 364 lb 10.3 oz  Weight (kg) 162.1 kg 165.4 kg 165.4 kg      Telemetry NSR - Personally Reviewed  ECG  N/a - Personally Reviewed  Physical Exam  GEN: No acute distress.   Neck: No JVD Cardiac: RRR, no murmurs, rubs, or gallops.  Respiratory:crackles bilateral bases GI: Soft, nontender, non-distended  MS: No edema; No deformity. Neuro:  Nonfocal  Psych: Normal affect   Labs High Sensitivity Troponin:   Recent Labs  Lab 10/21/23 1223 10/21/23 1647  TROPONINIHS 45* 51*     Chemistry Recent Labs  Lab 10/21/23 1223 10/22/23 0323 10/23/23 0400  NA 139 140 137  K 3.9 3.4* 3.3*   CL 108 105 100  CO2 22 26 28   GLUCOSE 106* 82 100*  BUN 11 10 14   CREATININE 0.98 1.04 0.95  CALCIUM 8.8* 8.8* 9.0  MG  --  1.9 2.2  PROT  --  6.0*  --   ALBUMIN  --  3.2*  --   AST  --  17  --   ALT  --  25  --   ALKPHOS  --  66  --   BILITOT  --  0.7  --   GFRNONAA >60 >60 >60  ANIONGAP 9 9 9     Lipids No results for input(s): "CHOL", "TRIG", "HDL", "LABVLDL", "LDLCALC", "CHOLHDL" in the last 168 hours.  Hematology Recent Labs  Lab 10/21/23 1223 10/22/23 0323 10/23/23 0400  WBC 10.8* 9.2 9.0  RBC 5.68 5.40 5.54  HGB 14.9 14.0 14.5  HCT 48.3 44.4 44.5  MCV 85.0 82.2 80.3  MCH 26.2 25.9* 26.2  MCHC 30.8 31.5 32.6  RDW 17.0* 16.8* 16.4*  PLT 402* 353 315   Thyroid  Recent Labs  Lab 10/21/23 1647  TSH 1.582    BNP Recent Labs  Lab 10/21/23 1223  BNP 403.9*  DDimer No results for input(s): "DDIMER" in the last 168 hours.   Radiology  ECHOCARDIOGRAM COMPLETE Result Date: 10/22/2023    ECHOCARDIOGRAM REPORT   Patient Name:   Donald Murray Date of Exam: 10/22/2023 Medical Rec #:  [id number]     Height:       71.0 in Accession #:    [id number]    Weight:       364.6 lb Date of Birth:  1985-11-16    BSA:          2.722 m Patient Age:    37 years      BP:           130/100 mmHg Patient Gender: M             HR:           101 bpm. Exam Location:  Inpatient Procedure: 2D Echo, Cardiac Doppler, Color Doppler and Intracardiac            Opacification Agent (Both Spectral and Color Flow Doppler were            utilized during procedure). Indications:    Dyspea  History:        Patient has prior history of Echocardiogram examinations, most                 recent 03/30/2023. CHF; Risk Factors:Hypertension, Sleep Apnea                 and Current Smoker.  Sonographer:    Kip Peon RDCS Referring Phys: [id number] PROSPER M AMPONSAH  Sonographer Comments: Suboptimal apical window. Image acquisition challenging due to patient body habitus. IMPRESSIONS  1. Left ventricular ejection  fraction, by estimation, is 20 to 25%. The left ventricle has severely decreased function. The left ventricle demonstrates global hypokinesis. The left ventricular internal cavity size was severely dilated. Left ventricular diastolic parameters are consistent with Grade III diastolic dysfunction (restrictive).  2. Right ventricular systolic function is moderately reduced. The right ventricular size is not well visualized. Tricuspid regurgitation signal is inadequate for assessing PA pressure.  3. The mitral valve is normal in structure. Mild mitral valve regurgitation. No evidence of mitral stenosis.  4. The aortic valve is normal in structure. Aortic valve regurgitation is not visualized. No aortic stenosis is present.  5. The inferior vena cava is normal in size with greater than 50% respiratory variability, suggesting right atrial pressure of 3 mmHg. FINDINGS  Left Ventricle: Left ventricular ejection fraction, by estimation, is 20 to 25%. The left ventricle has severely decreased function. The left ventricle demonstrates global hypokinesis. Definity  contrast agent was given IV to delineate the left ventricular endocardial borders. The left ventricular internal cavity size was severely dilated. There is no left ventricular hypertrophy. Left ventricular diastolic parameters are consistent with Grade III diastolic dysfunction (restrictive). Right Ventricle: The right ventricular size is not well visualized. Right vetricular wall thickness was not well visualized. Right ventricular systolic function is moderately reduced. Tricuspid regurgitation signal is inadequate for assessing PA pressure. Left Atrium: Left atrial size was normal in size. Right Atrium: Right atrial size was normal in size. Pericardium: There is no evidence of pericardial effusion. Mitral Valve: The mitral valve is normal in structure. Mild mitral valve regurgitation. No evidence of mitral valve stenosis. Tricuspid Valve: The tricuspid valve is  normal in structure. Tricuspid valve regurgitation is not demonstrated. No evidence of tricuspid stenosis. Aortic Valve: The aortic valve is normal in structure. Aortic valve regurgitation is  not visualized. No aortic stenosis is present. Pulmonic Valve: The pulmonic valve was normal in structure. Pulmonic valve regurgitation is not visualized. No evidence of pulmonic stenosis. Aorta: The aortic root is normal in size and structure. Venous: The inferior vena cava is normal in size with greater than 50% respiratory variability, suggesting right atrial pressure of 3 mmHg. IAS/Shunts: No atrial level shunt detected by color flow Doppler.  LEFT VENTRICLE PLAX 2D LVIDd:         7.20 cm   Diastology LVIDs:         6.50 cm   LV e' medial:    4.56 cm/s LV PW:         1.20 cm   LV E/e' medial:  26.3 LV IVS:        1.30 cm   LV e' lateral:   10.40 cm/s LVOT diam:     2.60 cm   LV E/e' lateral: 11.5 LV SV:         40 LV SV Index:   15 LVOT Area:     5.31 cm  IVC IVC diam: 1.80 cm LEFT ATRIUM           Index LA diam:      5.40 cm 1.98 cm/m LA Vol (A4C): 69.7 ml 25.61 ml/m  AORTIC VALVE LVOT Vmax:   46.00 cm/s LVOT Vmean:  28.300 cm/s LVOT VTI:    0.074 m  AORTA Ao Root diam: 3.40 cm Ao Asc diam:  3.50 cm MITRAL VALVE MV Area (PHT): 8.62 cm     SHUNTS MV Decel Time: 88 msec      Systemic VTI:  0.07 m MR Peak grad: 48.2 mmHg     Systemic Diam: 2.60 cm MR Vmax:      347.00 cm/s MV E velocity: 120.00 cm/s MV A velocity: 31.00 cm/s MV E/A ratio:  3.87 Arta Lark Electronically signed by Arta Lark Signature Date/Time: 10/22/2023/12:50:50 PM    Final    CT Angio Chest PE W and/or Wo Contrast Result Date: 10/21/2023 CLINICAL DATA:  Pulmonary embolism (PE) suspected, high prob Shortness of breath. EXAM: CT ANGIOGRAPHY CHEST WITH CONTRAST TECHNIQUE: Multidetector CT imaging of the chest was performed using the standard protocol during bolus administration of intravenous contrast. Multiplanar CT image reconstructions and  MIPs were obtained to evaluate the vascular anatomy. RADIATION DOSE REDUCTION: This exam was performed according to the departmental dose-optimization program which includes automated exposure control, adjustment of the mA and/or kV according to patient size and/or use of iterative reconstruction technique. CONTRAST:  75mL OMNIPAQUE  IOHEXOL  350 MG/ML SOLN COMPARISON:  Radiograph earlier today.  Chest CT 08/15/2020 FINDINGS: Cardiovascular: There are no filling defects within the pulmonary arteries to suggest pulmonary embolus. The main pulmonary artery is dilated at 3.8 cm. The heart is enlarged. No pericardial effusion. Normal caliber thoracic aorta. Mediastinum/Nodes: Stable prominent bilateral hilar lymph nodes, up to 11 mm on the right series 7, image 82. No change from 2022 exam. Scattered small mediastinal nodes are not enlarged by size criteria. Decompressed esophagus. Lungs/Pleura: Small bilateral pleural effusions, right greater than left. Mild diffuse ground-glass and septal thickening suspicious for pulmonary edema. Small ground-glass nodules in the left upper lobe, series 8, images 19, 22, 28. Mild bronchial thickening is likely congestive. Upper Abdomen: Hepatic steatosis. Slightly lobulated hepatic contours. No acute upper abdominal findings. Musculoskeletal: There are no acute or suspicious osseous abnormalities. Review of the MIP images confirms the above findings. IMPRESSION: 1. No pulmonary embolus. 2. Cardiomegaly  with small bilateral pleural effusions, right greater than left. Mild diffuse ground-glass and septal thickening suspicious for pulmonary edema. 3. Small ground-glass nodules in the left upper lobe, likely infectious or inflammatory. 4. Dilated main pulmonary artery, can be seen with pulmonary arterial hypertension. 5. Hepatic steatosis. Slightly lobulated hepatic contours, can be seen with cirrhosis. Electronically Signed   By: Chadwick Colonel M.D.   On: 10/21/2023 18:33   US  EKG  SITE RITE Result Date: 10/21/2023 If Site Rite image not attached, placement could not be confirmed due to current cardiac rhythm.     Patient Profile   Zahari Xiang is a morbidly obese 38 y.o. male with HTN, previous IVDA (heroin), tobacco use and systolic HF. Admitted with A/C HFrEF.   Assessment & Plan   1.Acute on chronic HFrEF - Echo 9/21 EF 20-25% RV moderately HK  - 09/2023 echo: LVEF 20-25%, grade III dd, mod RV dysfunction - LHC 1/23 no CAD EF < 10%   - COOX 64%, CVP 9,MAPs 70s - on IV lasix  80mg  bid. I/Os incomplete, at least 4.7L of uop. Renal function is stable - continue IV diuresis.   - no beta blocker due to borderline low output.  - medical therapy with digoxin  0.125, corlanor  2.5mg  bid, losartan  25mg , aldactone  25mg m.  - being considered for possible VAD by advanced HF team.    - he wants to sign out AMA. I have told him specifically of the cardiac risks. Primary team to also discuss with him, strongly advised to stay.   For questions or updates, please contact [facility] Please consult [email or web address] for contact info under     Signed, Armida Lander, MD  10/23/2023, 2:01 PM

## 2023-10-23 NOTE — Plan of Care (Signed)

## 2023-10-25 ENCOUNTER — Telehealth (HOSPITAL_COMMUNITY): Payer: Self-pay

## 2023-10-25 NOTE — Telephone Encounter (Addendum)
 Patient reports that he left the hospital AMA over the weekend and wanted to know if he should restart his medication. Patient is scheduled to be seen Wednesday .please advise

## 2023-10-26 NOTE — Telephone Encounter (Signed)
 No answer, Left message to return call.

## 2023-10-26 NOTE — Telephone Encounter (Signed)
 Patient advised and verbalized understanding

## 2023-10-27 ENCOUNTER — Ambulatory Visit (HOSPITAL_COMMUNITY): Payer: Self-pay | Admitting: Family Medicine

## 2023-10-27 ENCOUNTER — Encounter (HOSPITAL_COMMUNITY): Payer: Self-pay

## 2023-10-27 ENCOUNTER — Other Ambulatory Visit (HOSPITAL_BASED_OUTPATIENT_CLINIC_OR_DEPARTMENT_OTHER): Payer: Self-pay

## 2023-10-27 ENCOUNTER — Ambulatory Visit (HOSPITAL_COMMUNITY)
Admission: RE | Admit: 2023-10-27 | Discharge: 2023-10-27 | Disposition: A | Discharge status: Home / Self Care | Payer: MEDICAID | Source: Ambulatory Visit | Attending: Family Medicine | Admitting: Family Medicine

## 2023-10-27 VITALS — BP 146/72 | HR 113 | Wt 364.8 lb

## 2023-10-27 DIAGNOSIS — R9431 Abnormal electrocardiogram [ECG] [EKG]: Secondary | ICD-10-CM | POA: Diagnosis not present

## 2023-10-27 DIAGNOSIS — I5022 Chronic systolic (congestive) heart failure: Secondary | ICD-10-CM | POA: Diagnosis not present

## 2023-10-27 DIAGNOSIS — Z6841 Body Mass Index (BMI) 40.0 and over, adult: Secondary | ICD-10-CM | POA: Diagnosis not present

## 2023-10-27 DIAGNOSIS — R000 Tachycardia, unspecified: Secondary | ICD-10-CM

## 2023-10-27 DIAGNOSIS — G4733 Obstructive sleep apnea (adult) (pediatric): Secondary | ICD-10-CM | POA: Diagnosis not present

## 2023-10-27 DIAGNOSIS — I110 Hypertensive heart disease with heart failure: Secondary | ICD-10-CM | POA: Insufficient documentation

## 2023-10-27 DIAGNOSIS — I10 Essential (primary) hypertension: Secondary | ICD-10-CM | POA: Diagnosis not present

## 2023-10-27 DIAGNOSIS — Z720 Tobacco use: Secondary | ICD-10-CM

## 2023-10-27 DIAGNOSIS — F1111 Opioid abuse, in remission: Secondary | ICD-10-CM | POA: Insufficient documentation

## 2023-10-27 DIAGNOSIS — F17210 Nicotine dependence, cigarettes, uncomplicated: Secondary | ICD-10-CM | POA: Insufficient documentation

## 2023-10-27 DIAGNOSIS — I5020 Unspecified systolic (congestive) heart failure: Secondary | ICD-10-CM | POA: Diagnosis present

## 2023-10-27 DIAGNOSIS — I493 Ventricular premature depolarization: Secondary | ICD-10-CM | POA: Diagnosis not present

## 2023-10-27 LAB — BASIC METABOLIC PANEL WITH GFR
Anion gap: 12 (ref 5–15)
BUN: 15 mg/dL (ref 6–20)
CO2: 21 mmol/L — ABNORMAL LOW (ref 22–32)
Calcium: 9.1 mg/dL (ref 8.9–10.3)
Chloride: 107 mmol/L (ref 98–111)
Creatinine, Ser: 1.05 mg/dL (ref 0.61–1.24)
GFR, Estimated: >60 mL/min (ref >60)
Glucose, Bld: 92 mg/dL (ref 70–99)
Potassium: 4.5 mmol/L (ref 3.5–5.1)
Sodium: 140 mmol/L (ref 135–145)

## 2023-10-27 LAB — BRAIN NATRIURETIC PEPTIDE: B Natriuretic Peptide: 575.6 pg/mL — ABNORMAL HIGH (ref 0.0–100.0)

## 2023-10-27 LAB — DIGOXIN LEVEL: Digoxin Level: <0.4 ng/mL — ABNORMAL LOW (ref 0.8–2.0)

## 2023-10-27 MED ORDER — POTASSIUM CHLORIDE CRYS ER 20 MEQ PO TBCR
40.0000 meq | EXTENDED_RELEASE_TABLET | Freq: Two times a day (BID) | ORAL | 11 refills | Status: DC
  Filled 2023-10-27: qty 60, 15d supply, fill #0
  Filled 2023-12-22 – 2024-01-11 (×2): qty 60, 15d supply, fill #1

## 2023-10-27 MED ORDER — ENTRESTO 24-26 MG PO TABS
1.0000 | ORAL_TABLET | Freq: Two times a day (BID) | ORAL | 11 refills | Status: AC
  Filled 2023-10-27: qty 60, 30d supply, fill #0
  Filled 2023-12-22 – 2024-01-12 (×2): qty 60, 30d supply, fill #1
  Filled 2024-01-12: qty 60, 30d supply, fill #0
  Filled 2024-03-16 – 2024-03-28 (×2): qty 60, 30d supply, fill #1
  Filled 2024-05-11 – 2024-06-01 (×2): qty 60, 30d supply, fill #2

## 2023-10-27 MED ORDER — DAPAGLIFLOZIN PROPANEDIOL 10 MG PO TABS
10.0000 mg | ORAL_TABLET | Freq: Every day | ORAL | 11 refills | Status: AC
  Filled 2023-10-27: qty 30, 30d supply, fill #0
  Filled 2023-12-22 – 2024-01-11 (×2): qty 30, 30d supply, fill #1
  Filled 2024-01-12: qty 30, 30d supply, fill #0
  Filled 2024-02-07: qty 30, 30d supply, fill #1
  Filled 2024-03-16 – 2024-03-28 (×2): qty 30, 30d supply, fill #2
  Filled 2024-04-23: qty 30, 30d supply, fill #3
  Filled 2024-06-01: qty 30, 30d supply, fill #4

## 2023-10-27 NOTE — Progress Notes (Signed)
 ADVANCED HF CLINIC NOTE  Primary Care: Patient, [name] Primary Cardiologist: Alexandria Angel, MD HF Cardiologist: Dr. Julane Ny  HPI: Donald Murray is a morbidly obese 38 y.o. male with HTN, previous IVDA (heroin), tobacco use and systolic HF (onset 9/21).  Admitted to [facility] 9/21 with new onset HF in setting of severe HTN 174/128. ECG with sinus tach and frequent PVCs. Echo EF 20-25% Moderate RV dysfunction.   HF Consultation for the first time 12/21. 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 Zio placed to quantify PVC burden - 4.3%.  Admitted 12/22 with ADHF. Echo LVEF <20% w/ global HK, no visible thrombus, RV okay, mild MR.  LHC 1/23 with normal cors. EF < 10% Elevated filling pressures (PCWP 28) and low output (CI 1.5). Lasix  increased to 80 bid.  Sleep study 2/23: Severe OSA, moderate central sleep apnea. Waiting for CPAP machine, reports he was told there is a backorder on supplies.  Seen by Dr. Rodolfo Clan in 7/23 and planning ICD.   Echo 11/24: EF 25%, RV moderately down  Seen in HF clinic 06/24/23. Volume overloaded.  Given 80 mg Furoscix . He wore on body infuser 4 hours then he accidentally pulled off. He reports lots of urine output.   Admitted 09/02/23 for incarcerated ventral hernia s/p repair. Course uncomplicated.   Admitted 5/25 with a/c HF. Diuresed with IV lasix . Ended up leaving AMA.  Today he returns for post hospital HF follow up. Overall feeling fine. No SOB with activity or work duties. Denies palpitations, abnormal bleeding, CP, dizziness, edema, or PND/Orthopnea. Appetite ok. Weight at home 363 pounds. Taking all medications. Working 3rd shift. Says he left AMA due to depression. Smoking 10-15 cigs/day.  Cardiac Studies: - Echo 11/24: EF 25%, RV moderately down - Echo 2/24: EF 25-30%, RV moderately HK - Echo 09/24/21: EF 20-25%, RV moderately HK - Echo 10/20: EF 60-65%   - CPX test 2/23 FVC 5.12  (92%)      FEV1 4.34  (96%)        FEV1/FVC 85   (103%)         MVV 164  (93%)   Resting HR: 126 Standing HR:125 Peak HR: 175   (95% age predicted max HR)  BP rest (standing): 92/70 BP peak: 148/72  Peak VO2: 21.6 (87% predicted peak VO2)  When adjusted to the patient's ideal body weight of 180.3 lb (81.8 kg) the peak VO2 is 41.6 ml/kg (ibw)/min (99% of the ibw-adjusted predicted).  VE/VCO2 slope:  45  VE/MVV:  92%  O2pulse:  19 (90% predicted O2pulse)   - RHC 1/23: RA 9, PA 66/38 (50), PCWP 28, CO/CI (fick) 3.9/1.5, PVR 5.7, PAPi 3.1  - Zio 12/21 Sinus - average HR 104 4.3% PVCs  Past Medical History:  Diagnosis Date   Abdominal hernia    CHF (congestive heart failure) (HCC)    Hypertension    Obesity    Snoring    Current Outpatient Medications  Medication Sig Dispense Refill   acetaminophen  (TYLENOL ) 500 MG tablet Take 1 tablet (500 mg total) by mouth every 6 (six) hours as needed. (Patient taking differently: Take 500 mg by mouth every 6 (six) hours as needed for mild pain (pain score 1-3).) 30 tablet 0   digoxin  (LANOXIN ) 0.125 MG tablet Take 1 tablet (0.125 mg total) by mouth daily. 90 tablet 3   ivabradine  (CORLANOR ) 7.5 MG TABS tablet Take 1 tablet (7.5 mg total) by mouth 2 (two) times daily with a meal. 60 tablet 11  oxyCODONE  (ROXICODONE ) 5 MG immediate release tablet Take 1 tablet (5 mg total) by mouth every 4 (four) hours as needed for severe pain (pain score 7-10). 20 tablet 0   Semaglutide -Weight Management (WEGOVY ) 2.4 MG/0.75ML SOAJ Inject 2.4 mg into the skin once a week. (Patient taking differently: Inject 2.4 mg into the skin once a week. Takes on Saturdays) 3 mL 5   spironolactone  (ALDACTONE ) 25 MG tablet Take 1 tablet (25 mg total) by mouth daily. 90 tablet 3   torsemide  (DEMADEX ) 20 MG tablet Take 4 tablets (80 mg total) by mouth daily. 120 tablet 0   [Paused] metoprolol  succinate (TOPROL  XL) 25 MG 24 hr tablet Take 1 tablet (25 mg total) by mouth daily. (Patient not taking: Reported on 10/27/2023) 90 tablet 3   No  current facility-administered medications for this encounter.   No Known Allergies  Social History   Socioeconomic History   Marital status: Single    Spouse name: Not on file   Number of children: Not on file   Years of education: Not on file   Highest education level: Not on file  Occupational History   Not on file  Tobacco Use   Smoking status: Every Day    Current packs/day: 1.00    Average packs/day: 1 pack/day for 16.0 years (16.0 ttl pk-yrs)    Types: Cigarettes   Smokeless tobacco: Never  Vaping Use   Vaping status: Some Days   Substances: Nicotine   Substance and Sexual Activity   Alcohol use: Not Currently    Comment: Pt stated "2 years clean"   Drug use: Not Currently    Comment: Pt stated "It was opiates"   Sexual activity: Not on file  Other Topics Concern   Not on file  Social History Narrative   Not on file   Social Drivers of Health   Financial Resource Strain: High Risk (04/08/2022)   Overall Financial Resource Strain (CARDIA)    Difficulty of Paying Living Expenses: Very hard  Food Insecurity: No Food Insecurity (10/22/2023)   Hunger Vital Sign    Worried About Running Out of Food in the Last Year: Never true    Ran Out of Food in the Last Year: Never true  Transportation Needs: No Transportation Needs (10/22/2023)   PRAPARE - Administrator, Civil Service (Medical): No    Lack of Transportation (Non-Medical): No  Physical Activity: Not on file  Stress: Not on file  Social Connections: Not on file  Intimate Partner Violence: Not At Risk (10/22/2023)   Humiliation, Afraid, Rape, and Kick questionnaire    Fear of Current or Ex-Partner: No    Emotionally Abused: No    Physically Abused: No    Sexually Abused: No   Family History  Problem Relation Age of Onset   Hypertension Mother    Hypertension Father    BP (!) 146/72   Pulse (!) 113   Wt (!) 165.5 kg (364 lb 12.8 oz)   SpO2 96%   BMI 50.88 kg/m   Wt Readings from Last 3  Encounters:  10/27/23 (!) 165.5 kg (364 lb 12.8 oz)  10/23/23 (!) 162.1 kg (357 lb 5.9 oz)  10/13/23 (!) 172.4 kg (380 lb)    PHYSICAL EXAM: General:  NAD. No resp difficulty HEENT: Normal Neck: Supple. No JVD. Cor: Regular rate & rhythm. No rubs, gallops or murmurs. Lungs: Clear Abdomen: Soft, nontender, nondistended.  Extremities: No cyanosis, clubbing, rash, edema Neuro: Alert & oriented x 3, moves  all 4 extremities w/o difficulty. Affect pleasant.  ECG (personally reviewed):   ReDs reading: unable to obtain reading  ASSESSMENT & PLAN:  1. Chronic systolic HF - Echo 9/21: EF 20-25% RV moderately HK. Suspect HTN vs PVC-mediated (PVC burden 4.3% - probably not high enough to cause CM) - LHC 1/23: no CAD EF < 10% - CPX 2/23: pVO2: 21.6 (87% predicted peak VO2) corrected for ibw pVO2 41.6 ml/kg (ibw)/min (99% of the ibw-adjusted predicted). Slope: 45 O2pulse:  19 (90% pred). Test reassuring VO2 but slope high  - Echo 5/23, 2/24, and 11/24: EF 20-25% RV moderately HK - Admitted 5/25 with a/c HF. Left AMA - Seems better NYHA I symptoms, volume difficult due to body habitus. Likely has some fluid on board - Restart Entresto  24/26 mg bid - Restart Farxiga  10 mg daily - Restart 40 KCL daily - Continue torsemide  80 mg daily - Hold off on beta blocker  - Continue digoxin  0.125 mg daily.   - Continue spiro 25 mg daily. - Continue Ivabradine  7.5 mg bid - Undecided about ICD. He has been referred back to Dr. Rodolfo Clan - Has previously been seen and followed in VAD clinic but ended up symptomatically improving and he didn't want to go through with LVAD at that time. Now seems open to the idea. -  May need RHC +/- repeat CPX to risk stratify for advanced therapies - Labs today.   2. HTN - BP elevated, has not had meds yet - Med changes as above   3. Sinus Tach  - Has not had meds yet - Holding BB with recent decompensation - Continue Ivabradine  7.5 mg bid - Continue digoxin  0.125 mg  daily.  - TSH WNL   4.  H/o Frequent PVCs - Zio 12/21 4.3% PVCs - probably not high enough burden to cause CM  - Has severe OSA, unable to afford CPAP   5.  Morbid obesity - Body mass index is 50.88 kg/m. - On WeGovy , has not had meaningful weight loss.  - Weight actually has continued to climb. 35-40 lbs up in 2 years.  - Consider switch to tirzepatide?.   6. OSA, Severe - Not on CPAP yet.  - Cannot afford. Engage HFSW for resources.   7.  Tobacco use - Smoking 10-15 cigs/day - Discussed smoking cessation   8. H/O IVDA (heroin) - Last used 2020  - UDS + for opiates this admit, has Rx for PRN oxy   Keep follow up with Dr. Bensimhon, as scheduled  [facility], FNP-BC 10/27/23

## 2023-10-27 NOTE — Patient Instructions (Addendum)
 Thank you for coming in today  If you had labs drawn today, any labs that are abnormal the clinic will call you No news is good news  Medications: Restart Potassium 40 meq 2 tablets daily Restart Entresto  24/26 mg 1 tm twice daily Restart Farxiga  10 mg 1 tablet daily    Follow up appointments:  Your physician recommends that you schedule a follow-up appointment in:  Keep follow up With Dr. Julane Ny    Do the following things EVERYDAY: Weigh yourself in the morning before breakfast. Write it down and keep it in a log. Take your medicines as prescribed Eat low salt foods--Limit salt (sodium) to 2000 mg per day.  Stay as active as you can everyday Limit all fluids for the day to less than 2 liters   At the [facility], you and your health needs are our priority. As part of our continuing mission to provide you with exceptional heart care, we have created designated Provider Care Teams. These Care Teams include your primary Cardiologist (physician) and Advanced Practice Providers (APPs- Physician Assistants and Nurse Practitioners) who all work together to provide you with the care you need, when you need it.   You may see any of the following providers on your designated Care Team at your next follow up: Dr Jules Oar Dr Peder Bourdon Dr. Mimi Alt, NP Ruddy Corral, [place] [facility], [place] Dennise Fitz, NP Luster Salters, PharmD   Please be sure to bring in all your medications bottles to every appointment.    Thank you for choosing [facility]  If you have any questions or concerns before your next appointment please send us  a message through [place] or call our office at [phone].    TO LEAVE A MESSAGE FOR THE NURSE SELECT OPTION 2, PLEASE LEAVE A MESSAGE INCLUDING: YOUR NAME DATE OF BIRTH CALL BACK NUMBER REASON FOR CALL**this is important as we prioritize the call  backs  YOU WILL RECEIVE A CALL BACK THE SAME DAY AS LONG AS YOU CALL BEFORE 4:00 PM

## 2023-10-28 ENCOUNTER — Other Ambulatory Visit (HOSPITAL_BASED_OUTPATIENT_CLINIC_OR_DEPARTMENT_OTHER): Payer: Self-pay

## 2023-10-28 ENCOUNTER — Other Ambulatory Visit (HOSPITAL_COMMUNITY): Payer: Self-pay | Admitting: Cardiology

## 2023-10-28 DIAGNOSIS — I5023 Acute on chronic systolic (congestive) heart failure: Secondary | ICD-10-CM

## 2023-10-28 DIAGNOSIS — R000 Tachycardia, unspecified: Secondary | ICD-10-CM

## 2023-10-28 MED ORDER — IVABRADINE HCL 7.5 MG PO TABS
7.5000 mg | ORAL_TABLET | Freq: Two times a day (BID) | ORAL | 11 refills | Status: DC
  Filled 2023-10-28: qty 60, 30d supply, fill #0

## 2023-11-03 ENCOUNTER — Other Ambulatory Visit (HOSPITAL_COMMUNITY): Payer: Self-pay

## 2023-11-08 ENCOUNTER — Other Ambulatory Visit (HOSPITAL_BASED_OUTPATIENT_CLINIC_OR_DEPARTMENT_OTHER): Payer: Self-pay

## 2023-11-16 ENCOUNTER — Telehealth (HOSPITAL_COMMUNITY): Payer: Self-pay | Admitting: Internal Medicine

## 2023-11-16 NOTE — Telephone Encounter (Signed)
 Called to confirm/remind patient of their appointment at the [facility] on 11/16/2023.   Appointment:   [x] Confirmed  [] Left mess   [] No answer/No voice mail  [] VM Full/unable to leave message  [] Phone not in service  Patient reminded to bring all medications and/or complete list.  Confirmed patient has transportation. Gave directions, instructed to utilize valet parking.

## 2023-11-17 ENCOUNTER — Telehealth: Payer: Self-pay | Admitting: Licensed Clinical Social Worker

## 2023-11-17 ENCOUNTER — Telehealth (HOSPITAL_COMMUNITY): Payer: Self-pay | Admitting: Pharmacy Technician

## 2023-11-17 ENCOUNTER — Other Ambulatory Visit (HOSPITAL_COMMUNITY): Payer: Self-pay

## 2023-11-17 ENCOUNTER — Encounter (HOSPITAL_COMMUNITY): Payer: Self-pay | Admitting: Internal Medicine

## 2023-11-17 ENCOUNTER — Ambulatory Visit (HOSPITAL_COMMUNITY)
Admission: RE | Admit: 2023-11-17 | Discharge: 2023-11-17 | Disposition: A | Discharge status: Home / Self Care | Payer: MEDICAID | Source: Ambulatory Visit | Attending: Internal Medicine | Admitting: Internal Medicine

## 2023-11-17 VITALS — BP 110/78 | HR 118 | Wt 359.8 lb

## 2023-11-17 DIAGNOSIS — Z6841 Body Mass Index (BMI) 40.0 and over, adult: Secondary | ICD-10-CM | POA: Insufficient documentation

## 2023-11-17 DIAGNOSIS — F17210 Nicotine dependence, cigarettes, uncomplicated: Secondary | ICD-10-CM | POA: Insufficient documentation

## 2023-11-17 DIAGNOSIS — G4733 Obstructive sleep apnea (adult) (pediatric): Secondary | ICD-10-CM | POA: Diagnosis not present

## 2023-11-17 DIAGNOSIS — I5023 Acute on chronic systolic (congestive) heart failure: Secondary | ICD-10-CM

## 2023-11-17 DIAGNOSIS — F1111 Opioid abuse, in remission: Secondary | ICD-10-CM | POA: Diagnosis not present

## 2023-11-17 DIAGNOSIS — R000 Tachycardia, unspecified: Secondary | ICD-10-CM | POA: Insufficient documentation

## 2023-11-17 DIAGNOSIS — Z79899 Other long term (current) drug therapy: Secondary | ICD-10-CM | POA: Insufficient documentation

## 2023-11-17 DIAGNOSIS — Z716 Tobacco abuse counseling: Secondary | ICD-10-CM | POA: Diagnosis not present

## 2023-11-17 DIAGNOSIS — I110 Hypertensive heart disease with heart failure: Secondary | ICD-10-CM | POA: Diagnosis not present

## 2023-11-17 DIAGNOSIS — Z7984 Long term (current) use of oral hypoglycemic drugs: Secondary | ICD-10-CM | POA: Diagnosis not present

## 2023-11-17 DIAGNOSIS — I5022 Chronic systolic (congestive) heart failure: Secondary | ICD-10-CM | POA: Diagnosis present

## 2023-11-17 DIAGNOSIS — I493 Ventricular premature depolarization: Secondary | ICD-10-CM

## 2023-11-17 LAB — BASIC METABOLIC PANEL WITH GFR
Anion gap: 10 (ref 5–15)
BUN: 12 mg/dL (ref 6–20)
CO2: 23 mmol/L (ref 22–32)
Calcium: 9 mg/dL (ref 8.9–10.3)
Chloride: 106 mmol/L (ref 98–111)
Creatinine, Ser: 1.12 mg/dL (ref 0.61–1.24)
GFR, Estimated: >60 mL/min (ref >60)
Glucose, Bld: 95 mg/dL (ref 70–99)
Potassium: 3.8 mmol/L (ref 3.5–5.1)
Sodium: 139 mmol/L (ref 135–145)

## 2023-11-17 LAB — BRAIN NATRIURETIC PEPTIDE: B Natriuretic Peptide: 821.9 pg/mL — ABNORMAL HIGH (ref 0.0–100.0)

## 2023-11-17 MED ORDER — IVABRADINE HCL 7.5 MG PO TABS
7.5000 mg | ORAL_TABLET | Freq: Two times a day (BID) | ORAL | 11 refills | Status: AC
  Filled 2023-11-17: qty 60, 30d supply, fill #0
  Filled 2023-11-27: qty 60, 30d supply, fill #1
  Filled 2023-12-01: qty 60, 30d supply, fill #0
  Filled 2024-01-11: qty 60, 30d supply, fill #1
  Filled 2024-03-16 – 2024-03-28 (×2): qty 60, 30d supply, fill #2
  Filled 2024-05-11 – 2024-06-01 (×2): qty 60, 30d supply, fill #3

## 2023-11-17 MED ORDER — METOPROLOL SUCCINATE ER 25 MG PO TB24
25.0000 mg | ORAL_TABLET | Freq: Every day | ORAL | 3 refills | Status: DC
  Filled 2023-11-17 – 2023-12-01 (×2): qty 90, 90d supply, fill #0
  Filled 2024-01-11 – 2024-02-07 (×4): qty 90, 90d supply, fill #1

## 2023-11-17 NOTE — Patient Instructions (Signed)
 Medication Changes:  RESTART Metoprolol  XL 25 mg Daily  RESTART Corlanor  7.5 mg Twice daily   Lab Work:  Labs done today, your results will be available in MyChart, we will contact you for abnormal readings.   Special Instructions // Education:  Do the following things EVERYDAY: Weigh yourself in the morning before breakfast. Write it down and keep it in a log. Take your medicines as prescribed Eat low salt foods--Limit salt (sodium) to 2000 mg per day.  Stay as active as you can everyday Limit all fluids for the day to less than 2 liters   Follow-Up in: 1 month   At the [facility], you and your health needs are our priority. We have a designated team specialized in the treatment of Heart Failure. This Care Team includes your primary Heart Failure Specialized Cardiologist (physician), Advanced Practice Providers (APPs- Physician Assistants and Nurse Practitioners), and Pharmacist who all work together to provide you with the care you need, when you need it.   You may see any of the following providers on your designated Care Team at your next follow up:  Dr. Toribio Fuel Dr. Ezra Shuck Dr. Ria Commander Dr. Odis Brownie Greig Mosses, NP Caffie Shed, [place] [facility], [place] Beckey Coe, NP [place] Lee, NP Tinnie Redman, PharmD   Please be sure to bring in all your medications bottles to every appointment.   Need to Contact Us :  If you have any questions or concerns before your next appointment please send us  a message through [place] or call our office at [phone].    TO LEAVE A MESSAGE FOR THE NURSE SELECT OPTION 2, PLEASE LEAVE A MESSAGE INCLUDING: YOUR NAME DATE OF BIRTH CALL BACK NUMBER REASON FOR CALL**this is important as we prioritize the call backs  YOU WILL RECEIVE A CALL BACK THE SAME DAY AS LONG AS YOU CALL BEFORE 4:00 PM

## 2023-11-17 NOTE — Progress Notes (Addendum)
 ADVANCED HF CLINIC NOTE  Primary Care: Patient, [name] Primary Cardiologist: Redell Shallow, MD HF Cardiologist: Dr. Cherrie  Reason for Visit: Heart Failure Follow-up HPI: Donald Murray is a morbidly obese 38 y.o. male with HTN, previous IVDA (heroin), tobacco use and systolic HF (onset 9/21).  Admitted to [facility] 9/21 with new onset HF in setting of severe HTN 174/128. ECG with sinus tach and frequent PVCs. Echo EF 20-25% Moderate RV dysfunction.   HF Consultation for the first time 12/21. 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 Zio placed to quantify PVC burden - 4.3%.  Admitted 12/22 with ADHF. Echo LVEF <20% w/ global HK, no visible thrombus, RV okay, mild MR.  LHC 1/23 with normal cors. EF < 10% Elevated filling pressures (PCWP 28) and low output (CI 1.5). Lasix  increased to 80 bid.  Sleep study 2/23: Severe OSA, moderate central sleep apnea. Waiting for CPAP machine, reports he was told there is a backorder on supplies.  Seen by Dr. Fernande in 7/23 and planning ICD. But hasn't been done due to scheduling issues.   Echo 11/24: EF 25%, RV moderately down  06/24/23. Seen in HF clinic. Volume overloaded.  Given 80 mg Furoscix . He wore on body infuser 4 hours then he accidentally pulled off. He reports lots of urine output.   Admitted 09/02/23 for incarcerated ventral hernia s/p repair. Course uncomplicated.   Admitted in 5/25 for ADHF. Echo EF 20-25% mod RV HK  Co-ox ok Diuresed 20 pounds then left AMA   He returns today for post-hospital f/u. Under a lot of stress. [facility] but behind on payments. Feels OK. Has taken all meds but couldn't afford Corlanor . No CP. SOB with mild activity. BP ok.    Cardiac Studies: - Echo 11/24: EF 25%, RV moderately down - Echo 2/24: EF 25-30%. RV moderately HK - Echo 09/24/21: EF 20-25% RV moderately HK - Echo 10/20: EF 60-65%   - CPX test 2/23 FVC 5.12  (92%)      FEV1 4.34  (96%)        FEV1/FVC 85   (103%)        MVV 164   (93%)   Resting HR: 126 Standing HR:125 Peak HR: 175   (95% age predicted max HR)  BP rest (standing): 92/70 BP peak: 148/72  Peak VO2: 21.6 (87% predicted peak VO2)  When adjusted to the patient's ideal body weight of 180.3 lb (81.8 kg) the peak VO2 is 41.6 ml/kg (ibw)/min (99% of the ibw-adjusted predicted).  VE/VCO2 slope:  45  VE/MVV:  92%  O2pulse:  19 (90% predicted O2pulse)   - RHC 1/23: RA 9, PA 66/38 (50), PCWP 28, CO/CI (fick) 3.9/1.5, PVR 5.7, PAPi 3.1  - Zio 12/21 Sinus - average HR 104 4.3% PVCs  Past Medical History:  Diagnosis Date   Abdominal hernia    CHF (congestive heart failure) (HCC)    Hypertension    Obesity    Snoring    Current Outpatient Medications  Medication Sig Dispense Refill   acetaminophen  (TYLENOL ) 500 MG tablet Take 1 tablet (500 mg total) by mouth every 6 (six) hours as needed. 30 tablet 0   dapagliflozin  propanediol (FARXIGA ) 10 MG TABS tablet Take 1 tablet (10 mg total) by mouth daily before breakfast. 30 tablet 11   digoxin  (LANOXIN ) 0.125 MG tablet Take 1 tablet (0.125 mg total) by mouth daily. 90 tablet 3   ivabradine  (CORLANOR ) 7.5 MG TABS tablet Take 1 tablet (7.5 mg  total) by mouth 2 (two) times daily with a meal. 60 tablet 11   potassium chloride  SA (KLOR-CON  M) 20 MEQ tablet Take 2 tablets (40 mEq total) by mouth 2 (two) times daily. 60 tablet 11   sacubitril -valsartan  (ENTRESTO ) 24-26 MG Take 1 tablet by mouth 2 (two) times daily. 60 tablet 11   Semaglutide -Weight Management (WEGOVY ) 2.4 MG/0.75ML SOAJ Inject 2.4 mg into the skin once a week. 3 mL 5   spironolactone  (ALDACTONE ) 25 MG tablet Take 1 tablet (25 mg total) by mouth daily. 90 tablet 3   torsemide  (DEMADEX ) 20 MG tablet Take 4 tablets (80 mg total) by mouth daily. 120 tablet 0   [Paused] metoprolol  succinate (TOPROL  XL) 25 MG 24 hr tablet Take 1 tablet (25 mg total) by mouth daily. (Patient not taking: Reported on 11/17/2023) 90 tablet 3   No current facility-administered  medications for this encounter.   No Known Allergies  Social History   Socioeconomic History   Marital status: Single    Spouse name: Not on file   Number of children: Not on file   Years of education: Not on file   Highest education level: Not on file  Occupational History   Not on file  Tobacco Use   Smoking status: Every Day    Current packs/day: 1.00    Average packs/day: 1 pack/day for 16.0 years (16.0 ttl pk-yrs)    Types: Cigarettes   Smokeless tobacco: Never  Vaping Use   Vaping status: Some Days   Substances: Nicotine   Substance and Sexual Activity   Alcohol use: Not Currently    Comment: Pt stated 2 years clean   Drug use: Not Currently    Comment: Pt stated It was opiates   Sexual activity: Not on file  Other Topics Concern   Not on file  Social History Narrative   Not on file   Social Drivers of Health   Financial Resource Strain: High Risk (04/08/2022)   Overall Financial Resource Strain (CARDIA)    Difficulty of Paying Living Expenses: Very hard  Food Insecurity: No Food Insecurity (10/22/2023)   Hunger Vital Sign    Worried About Running Out of Food in the Last Year: Never true    Ran Out of Food in the Last Year: Never true  Transportation Needs: No Transportation Needs (10/22/2023)   PRAPARE - Administrator, Civil Service (Medical): No    Lack of Transportation (Non-Medical): No  Physical Activity: Not on file  Stress: Not on file  Social Connections: Not on file  Intimate Partner Violence: Not At Risk (10/22/2023)   Humiliation, Afraid, Rape, and Kick questionnaire    Fear of Current or Ex-Partner: No    Emotionally Abused: No    Physically Abused: No    Sexually Abused: No   Family History  Problem Relation Age of Onset   Hypertension Mother    Hypertension Father     BP 110/78   Pulse (!) 118   Wt (!) 163.2 kg (359 lb 12.8 oz)   SpO2 98%   BMI 50.18 kg/m   Wt Readings from Last 3 Encounters:  11/17/23 (!) 163.2 kg  (359 lb 12.8 oz)  10/27/23 (!) 165.5 kg (364 lb 12.8 oz)  10/23/23 (!) 162.1 kg (357 lb 5.9 oz)    PHYSICAL EXAM: General:  Obese male No resp difficulty HEENT: normal Neck: supple. no JVD. Carotids 2+ bilat; no bruits. No lymphadenopathy or thryomegaly appreciated. Cor: Reg tachy No rubs, gallops  or murmurs. Lungs: clear Abdomen: obese soft, nontender, nondistended. Good bowel sounds. Extremities: no cyanosis, clubbing, rash, tr edema Neuro: alert & orientedx3, cranial nerves grossly intact. moves all 4 extremities w/o difficulty. Affect pleasant   ECG (personally reviewed): ST 111 Personally reviewed    ASSESSMENT & PLAN:  1. Chronic Systolic HF - Echo 9/21 EF 20-25% RV moderately HK. Suspect HTN vs PVC-mediated (PVC burden 4.3% - probably not high enough to cause CM) - LHC 1/23 no CAD EF < 10% - CPX 2/23 pVO2: 21.6 (87% predicted peak VO2) corrected for ibw pVO2 41.6 ml/kg (ibw)/min (99% of the ibw-adjusted predicted). Slope:45 O2pulse:  19 (90% pred) - Echo 5/23, 2/24, and 11/24: EF 20-25% RV moderately HK - CPX test reassuring VO2 but slope high  - Remains tenuous NYHA III-IIIb - Volume status improved after recent admit for diuresis - Restart Toprol  XL 25 mg daily. - Restart ivabradine  7.5 bid - Continue Torsemide  80 mg daily + 40 KCL daily.  - Continue Digoxin  0.125 mg daily. - Continue Spiro 25 mg daily. - Continue Farxiga  10  - has met with EP but decided to defer ICD - not candidate for advanced therapies currently given social situation  2. Sinus Tach  - Rate in 110 range today  - Resum Toprol  XL to 25 mg daily. - Resume Ivabradine .  - Continue digoxin  0.125 mg daily.   3.  H/O Frequent PVCs - Zio 12/21 4.3% PVCs - probably not high enough burden to cause CM  - Has severe OSA.   4.  Morbid obesity - Body mass index is 50.18 kg/m. - remains on GLP1RA without much success  5. OSA, Severe - Not on CPAP yet.  - Cannot afford. Engage HFSW for  resources.  6.  Tobacco use - Still smoking 1/2 ppd  - Discussed cessation  7. H/O IVDA (heroin) - Last used 2020  - No change  8. SDOH needs - refer to HFSW to help with housing issues   Toribio Fuel, MD 11:11 AM

## 2023-11-17 NOTE — Telephone Encounter (Signed)
 Pharmacy Patient Advocate Encounter  Insurance verification completed.   The patient is insured through [employer] [place].   Ran test claim for Corlanor . Currently a quantity of 180 is a 90 day supply and the co-pay is $4 .   This test claim was processed through [facility]- copay amounts may vary at other pharmacies due to pharmacy/plan contracts, or as the patient moves through the different stages of their insurance plan.   Almarie JULIANNA Pa, CPhT

## 2023-11-17 NOTE — Telephone Encounter (Signed)
 H&V Care Navigation CSW Progress Note  Clinical Social Worker contacted patient by phone to f/u on patient concerns related to lack of income while being at hospital/missing work. Per clinic pt has fallen behind on his rent payments at [facility]. Spoke with Zachary Class, house manager who shares since they pay weekly for housing and pt has intermittently been in and out of the hospital since April he owes about $660 for the past month. Zachary shares if we are able to assist that they will work with pt further to continue to stay at the house. He requested I send information about what is needed (letter of lease/rent from [facility], W9 form) to [email or web address]- I have done so.   Pt phone only able to receive calls not make them at this time. Called him as requested when he returned home at [phone]. Introduced self, role, reason for call. Pt has several past interactions with Lonell and Jenna, social work at [employer] clinic regarding VAD and CPAP. Pt confirmed home address, no current PCP, emergency contact remains his friend Heron. He has resided at [place] over his period of sobriety, works part time at [employer] in the evenings at [employer]. Has his own transportation at this time. Pt hopes to keep going to work consistently, shares since he had several unexpected admissions it was less consistent income than he was used to. He was receiving SNAP until 6/9 and has since re-certified. He is waiting on that determination. We discussed patient care fund guidelines and what we need from pt (patient agreement signed and dated, SNAP letter). He is agreeable to this being sent to his email and they have a printer at the house he can use. Sent to [email or web address] (will update in chart as incorrect currently).   Will f/u with caseworker at DSS regarding SNAP on Friday if no return letter of determination at that time. We will need that to provide  assistance. Once we have this sorted pt agreeable to me assisting him with establishing with a primary care provider as well.   We discussed also using AR account at [facility] pharmacies or [facility] to ensure he can get his medications and will not owe anything up front. We discussed if any issues with getting this set up at [facility] pharmacy to let me know. We also discussed additional food resources, will send food bank list to home address, encouraged him to use that as needed or to connect with a food bag at our HRT/VAS clinic pantry next appt.   No additional questions today. Mailed food resources and my card, emailed patient care agreement.   Patient is participating in a Managed Medicaid Plan:  No, [employer]  SDOH Screenings   Food Insecurity: Food Insecurity Present (11/17/2023)  Housing: High Risk (11/17/2023)  Transportation Needs: No Transportation Needs (11/17/2023)  Utilities: Not At Risk (11/17/2023)  Financial Resource Strain: High Risk (11/17/2023)  Tobacco Use: High Risk (11/17/2023)  Health Literacy: Adequate Health Literacy (11/17/2023)    Marit Lark, MSW, LCSW Clinical Social Worker II [facility] Health Heart/Vascular Care Navigation  [phone]- work cell phone (preferred)

## 2023-11-19 ENCOUNTER — Telehealth: Payer: Self-pay | Admitting: Licensed Clinical Social Worker

## 2023-11-19 NOTE — Telephone Encounter (Addendum)
 H&V Care Navigation CSW Progress Note  Clinical Social Worker contacted patient by phone to f/u during call to DSS [facility] caseworker this morning. Pt did not answer when attempted so I left him a voicemail at [phone] and let him know I do not have approval for DSS to provide me with any updates so at this time pt would need to call [facility] caseworker directly for any updates.   Received a call back from pt later and provided him that information again. He shared he can see that he was approved online. Encouraged him to see if caseworker could electronically send him approval letter or if he could pick it up at DSS since we need that for patient care fund request. Pt also reminded to reach out to his house manager regarding paperwork needed for patient assistance, emailed that to him earlier this week. Pt will reach out and see if any additional information needed.  1:07pm- Pt emailed me copy of his SNAP benefit letter, and confirmed house manager has received my email and is working on completing forms and getting them back to me. Once we have those I can submit for patient assistance request  Patient is participating in a Managed Medicaid Plan:  No, [employer] tailored care  SDOH Screenings   Food Insecurity: Food Insecurity Present (11/17/2023)  Housing: High Risk (11/17/2023)  Transportation Needs: No Transportation Needs (11/17/2023)  Utilities: Not At Risk (11/17/2023)  Financial Resource Strain: High Risk (11/17/2023)  Tobacco Use: High Risk (11/17/2023)  Health Literacy: Adequate Health Literacy (11/17/2023)    Marit Lark, MSW, LCSW Clinical Social Worker II [facility] Health Heart/Vascular Care Navigation  [phone]- work cell phone (preferred)

## 2023-11-24 ENCOUNTER — Telehealth: Payer: Self-pay | Admitting: Licensed Clinical Social Worker

## 2023-11-24 NOTE — Telephone Encounter (Signed)
 H&V Care Navigation CSW Progress Note  Clinical Social Worker received a call from pt (borrowing a friends phone as his has fallen and broken) to request email sent to Zachary be sent to [employer], to complete. He has completed his portion, f/u provided to team lead Garrison, will also discuss disability with pt as work ability may be affected by ongoing hospitalizations and we want to make sure he doesn't recurrently end up in the same situation.  Patient is participating in a Managed Medicaid Plan:  No, [employer] Plan  SDOH Screenings   Food Insecurity: Food Insecurity Present (11/17/2023)  Housing: High Risk (11/17/2023)  Transportation Needs: No Transportation Needs (11/17/2023)  Utilities: Not At Risk (11/17/2023)  Financial Resource Strain: High Risk (11/17/2023)  Tobacco Use: High Risk (11/17/2023)  Health Literacy: Adequate Health Literacy (11/17/2023)    Marit Lark, MSW, LCSW Clinical Social Worker II [facility] Health Heart/Vascular Care Navigation  [phone]- work cell phone (preferred)

## 2023-11-25 NOTE — Telephone Encounter (Signed)
 H&V Care Navigation CSW Progress Note  Clinical Social Worker contacted patient by phone to update him that approval was given for Patient Care Fund request for rent assistance, check request submitted. Will provide updates as it processes.  Patient is participating in a Managed Medicaid Plan:  No, [employer] Plan  SDOH Screenings   Food Insecurity: Food Insecurity Present (11/17/2023)  Housing: High Risk (11/17/2023)  Transportation Needs: No Transportation Needs (11/17/2023)  Utilities: Not At Risk (11/17/2023)  Financial Resource Strain: High Risk (11/17/2023)  Tobacco Use: High Risk (11/17/2023)  Health Literacy: Adequate Health Literacy (11/17/2023)    Marit Lark, MSW, LCSW Clinical Social Worker II [facility] Health Heart/Vascular Care Navigation  [phone]- work cell phone (preferred)

## 2023-11-25 NOTE — Telephone Encounter (Signed)
 H&V Care Navigation CSW Progress Note  Clinical Social Worker received a call from pt [phone], confirmed full name and DOB and added to chart as temp #) to see if any information received yet from house management team. Shared I had not received anything, pt has w9 but noted it wasn't dated, he will f/u with them to date and send me the letter. Once we have received that I can make formal check request for rental assistance.  Patient is participating in a Managed Medicaid Plan:  No, [employer] tailored care  SDOH Screenings   Food Insecurity: Food Insecurity Present (11/17/2023)  Housing: High Risk (11/17/2023)  Transportation Needs: No Transportation Needs (11/17/2023)  Utilities: Not At Risk (11/17/2023)  Financial Resource Strain: High Risk (11/17/2023)  Tobacco Use: High Risk (11/17/2023)  Health Literacy: Adequate Health Literacy (11/17/2023)    Marit Lark, MSW, LCSW Clinical Social Worker II [facility] Health Heart/Vascular Care Navigation  [phone]- work cell phone (preferred)

## 2023-11-27 ENCOUNTER — Other Ambulatory Visit (HOSPITAL_BASED_OUTPATIENT_CLINIC_OR_DEPARTMENT_OTHER): Payer: Self-pay

## 2023-11-29 ENCOUNTER — Other Ambulatory Visit (HOSPITAL_BASED_OUTPATIENT_CLINIC_OR_DEPARTMENT_OTHER): Payer: Self-pay

## 2023-11-29 ENCOUNTER — Other Ambulatory Visit: Payer: Self-pay

## 2023-11-29 ENCOUNTER — Other Ambulatory Visit (HOSPITAL_COMMUNITY): Payer: Self-pay

## 2023-12-01 ENCOUNTER — Other Ambulatory Visit (HOSPITAL_COMMUNITY): Payer: Self-pay

## 2023-12-01 ENCOUNTER — Other Ambulatory Visit: Payer: Self-pay

## 2023-12-09 NOTE — Telephone Encounter (Addendum)
 H&V Care Navigation CSW Progress Note  Clinical Social Worker received Patient Care Fund check and contacted Donald Murray with [employer]. She or Donald Murray will bring their ID and pick up check for rent payment today.  Addendum- check was picked up by house manager Donald, verified via ID. Provided payment. Encouraged him to let us  know if any issues.  Patient is participating in a Managed Medicaid Plan:  No, [employer] Plan  SDOH Screenings   Food Insecurity: Food Insecurity Present (11/17/2023)  Housing: High Risk (11/17/2023)  Transportation Needs: No Transportation Needs (11/17/2023)  Utilities: Not At Risk (11/17/2023)  Financial Resource Strain: High Risk (11/17/2023)  Tobacco Use: High Risk (11/17/2023)  Health Literacy: Adequate Health Literacy (11/17/2023)     Marit Lark, MSW, LCSW Clinical Social Worker II [facility] Health Heart/Vascular Care Navigation  [phone]- work cell phone (preferred)

## 2023-12-14 ENCOUNTER — Telehealth (HOSPITAL_COMMUNITY): Payer: Self-pay

## 2023-12-14 NOTE — Telephone Encounter (Signed)
 Called to confirm/remind patient of their appointment at the [facility] on 12/15/23.   Appointment:   [] Confirmed  [x] Left mess   [] No answer/No voice mail  [] VM Full/unable to leave message  [] Phone not in service  And to bring in all medications and/or complete list.

## 2023-12-15 ENCOUNTER — Encounter (HOSPITAL_COMMUNITY): Payer: Self-pay

## 2023-12-15 ENCOUNTER — Ambulatory Visit (HOSPITAL_COMMUNITY)
Admission: RE | Admit: 2023-12-15 | Discharge: 2023-12-15 | Disposition: A | Discharge status: Home / Self Care | Payer: MEDICAID | Source: Ambulatory Visit | Attending: Family Medicine | Admitting: Family Medicine

## 2023-12-15 ENCOUNTER — Ambulatory Visit (HOSPITAL_COMMUNITY): Payer: Self-pay | Admitting: Family Medicine

## 2023-12-15 VITALS — BP 128/84 | HR 126 | Wt 356.0 lb

## 2023-12-15 DIAGNOSIS — G4731 Primary central sleep apnea: Secondary | ICD-10-CM | POA: Diagnosis not present

## 2023-12-15 DIAGNOSIS — Z7984 Long term (current) use of oral hypoglycemic drugs: Secondary | ICD-10-CM | POA: Diagnosis not present

## 2023-12-15 DIAGNOSIS — Z716 Tobacco abuse counseling: Secondary | ICD-10-CM | POA: Diagnosis not present

## 2023-12-15 DIAGNOSIS — Z6841 Body Mass Index (BMI) 40.0 and over, adult: Secondary | ICD-10-CM | POA: Diagnosis not present

## 2023-12-15 DIAGNOSIS — I493 Ventricular premature depolarization: Secondary | ICD-10-CM

## 2023-12-15 DIAGNOSIS — I110 Hypertensive heart disease with heart failure: Secondary | ICD-10-CM | POA: Insufficient documentation

## 2023-12-15 DIAGNOSIS — R000 Tachycardia, unspecified: Secondary | ICD-10-CM | POA: Insufficient documentation

## 2023-12-15 DIAGNOSIS — Z720 Tobacco use: Secondary | ICD-10-CM

## 2023-12-15 DIAGNOSIS — F17210 Nicotine dependence, cigarettes, uncomplicated: Secondary | ICD-10-CM | POA: Diagnosis not present

## 2023-12-15 DIAGNOSIS — Z79899 Other long term (current) drug therapy: Secondary | ICD-10-CM | POA: Insufficient documentation

## 2023-12-15 DIAGNOSIS — G4733 Obstructive sleep apnea (adult) (pediatric): Secondary | ICD-10-CM | POA: Insufficient documentation

## 2023-12-15 DIAGNOSIS — I5022 Chronic systolic (congestive) heart failure: Secondary | ICD-10-CM | POA: Insufficient documentation

## 2023-12-15 DIAGNOSIS — F1990 Other psychoactive substance use, unspecified, uncomplicated: Secondary | ICD-10-CM

## 2023-12-15 LAB — BASIC METABOLIC PANEL WITH GFR
Anion gap: 11 (ref 5–15)
BUN: 16 mg/dL (ref 6–20)
CO2: 24 mmol/L (ref 22–32)
Calcium: 9.3 mg/dL (ref 8.9–10.3)
Chloride: 104 mmol/L (ref 98–111)
Creatinine, Ser: 0.93 mg/dL (ref 0.61–1.24)
GFR, Estimated: >60 mL/min (ref >60)
Glucose, Bld: 109 mg/dL — ABNORMAL HIGH (ref 70–99)
Potassium: 3.6 mmol/L (ref 3.5–5.1)
Sodium: 139 mmol/L (ref 135–145)

## 2023-12-15 LAB — BRAIN NATRIURETIC PEPTIDE: B Natriuretic Peptide: 398 pg/mL — ABNORMAL HIGH (ref 0.0–100.0)

## 2023-12-15 NOTE — Patient Instructions (Signed)
 Medication Changes:  No Changes In Medications at this time.   Lab Work:  Labs done today, your results will be available in MyChart, we will contact you for abnormal readings.  Special Instructions // Education:  PRIMARY CARE LIST GIVEN  Follow-Up in: 3 MONTHS WITH APP AS SCHEDULED   At the [facility], you and your health needs are our priority. We have a designated team specialized in the treatment of Heart Failure. This Care Team includes your primary Heart Failure Specialized Cardiologist (physician), Advanced Practice Providers (APPs- Physician Assistants and Nurse Practitioners), and Pharmacist who all work together to provide you with the care you need, when you need it.   You may see any of the following providers on your designated Care Team at your next follow up:  Dr. Toribio Fuel Dr. Ezra Shuck Dr. Ria Commander Dr. Odis Brownie Greig Mosses, NP Caffie Shed, [place] [facility] [place], [place] Beckey Coe, NP [place] Lee, NP Tinnie Redman, PharmD   Please be sure to bring in all your medications bottles to every appointment.   Need to Contact Us :  If you have any questions or concerns before your next appointment please send us  a message through [place] or call our office at [phone].    TO LEAVE A MESSAGE FOR THE NURSE SELECT OPTION 2, PLEASE LEAVE A MESSAGE INCLUDING: YOUR NAME DATE OF BIRTH CALL BACK NUMBER REASON FOR CALL**this is important as we prioritize the call backs  YOU WILL RECEIVE A CALL BACK THE SAME DAY AS LONG AS YOU CALL BEFORE 4:00 PM

## 2023-12-15 NOTE — Progress Notes (Signed)
 ADVANCED HF CLINIC NOTE  Primary Care: Patient, [name] Primary Cardiologist: Redell Shallow, MD HF Cardiologist: Dr. Cherrie  HPI: Donald Murray is a morbidly obese 38 y.o. male with HTN, previous IVDA (heroin), tobacco use and systolic HF (onset 9/21).  Admitted to [facility] 9/21 with new onset HF in setting of severe HTN 174/128. ECG with sinus tach and frequent PVCs. Echo EF 20-25% Moderate RV dysfunction.   HF Consultation for the first time 12/21. 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 Zio placed to quantify PVC burden - 4.3%.  Admitted 12/22 with ADHF. Echo LVEF <20% w/ global HK, no visible thrombus, RV okay, mild MR.  LHC 1/23 with normal cors. EF < 10% Elevated filling pressures (PCWP 28) and low output (CI 1.5). Lasix  increased to 80 bid.  Sleep study 2/23: Severe OSA, moderate central sleep apnea. Waiting for CPAP machine, reports he was told there is a backorder on supplies.  Seen by Dr. Fernande in 7/23 and planning ICD. But hasn't been done due to scheduling issues.   Echo 11/24: EF 25%, RV moderately down  06/24/23. Seen in HF clinic. Volume overloaded.  Given 80 mg Furoscix . He wore on body infuser 4 hours then he accidentally pulled off. He reports lots of urine output.   Admitted 09/02/23 for incarcerated ventral hernia s/p repair. Course uncomplicated.   Admitted in 5/25 for ADHF. Echo EF 20-25% mod RV HK  Co-ox ok Diuresed 20 pounds then left AMA   Today he returns for HF follow up. Overall feeling fine. Breathing OK, has good days and bad days, good days > bad days. No SOB walking on flat ground. Denies palpitations, abnormal bleeding, CP, dizziness, edema, or PND/Orthopnea. Appetite ok. Weight at home 350 pounds. Taking all medications. Still in [place] house. Smoking 1/2-1ppd. No ETOH or drug use.    Cardiac Studies: - Echo 5/25: EF 20-25%, mod RV HK - Echo 11/24: EF 25%, RV moderately down - Echo 2/24: EF 25-30%. RV moderately HK - Echo 09/24/21: EF 20-25% RV  moderately HK - Echo 10/20: EF 60-65%   - CPX test 2/23 FVC 5.12  (92%)      FEV1 4.34  (96%)        FEV1/FVC 85   (103%)        MVV 164  (93%)   Resting HR: 126 Standing HR:125 Peak HR: 175   (95% age predicted max HR)  BP rest (standing): 92/70 BP peak: 148/72  Peak VO2: 21.6 (87% predicted peak VO2)  When adjusted to the patient's ideal body weight of 180.3 lb (81.8 kg) the peak VO2 is 41.6 ml/kg (ibw)/min (99% of the ibw-adjusted predicted).  VE/VCO2 slope:  45  VE/MVV:  92%  O2pulse:  19 (90% predicted O2pulse)   - RHC 1/23: RA 9, PA 66/38 (50), PCWP 28, CO/CI (fick) 3.9/1.5, PVR 5.7, PAPi 3.1  - Zio 12/21 Sinus - average HR 104 4.3% PVCs  Past Medical History:  Diagnosis Date   Abdominal hernia    CHF (congestive heart failure) (HCC)    Hypertension    Obesity    Snoring    Current Outpatient Medications  Medication Sig Dispense Refill   acetaminophen  (TYLENOL ) 500 MG tablet Take 1 tablet (500 mg total) by mouth every 6 (six) hours as needed. 30 tablet 0   dapagliflozin  propanediol (FARXIGA ) 10 MG TABS tablet Take 1 tablet (10 mg total) by mouth daily before breakfast. 30 tablet 11   digoxin  (LANOXIN ) 0.125 MG tablet Take  1 tablet (0.125 mg total) by mouth daily. 90 tablet 3   ivabradine  (CORLANOR ) 7.5 MG TABS tablet Take 1 tablet (7.5 mg total) by mouth 2 (two) times daily with a meal. 60 tablet 11   metoprolol  succinate (TOPROL  XL) 25 MG 24 hr tablet Take 1 tablet (25 mg total) by mouth daily. 90 tablet 3   potassium chloride  SA (KLOR-CON  M) 20 MEQ tablet Take 2 tablets (40 mEq total) by mouth 2 (two) times daily. 60 tablet 11   sacubitril -valsartan  (ENTRESTO ) 24-26 MG Take 1 tablet by mouth 2 (two) times daily. 60 tablet 11   Semaglutide -Weight Management (WEGOVY ) 2.4 MG/0.75ML SOAJ Inject 2.4 mg into the skin once a week. 3 mL 5   spironolactone  (ALDACTONE ) 25 MG tablet Take 1 tablet (25 mg total) by mouth daily. 90 tablet 3   torsemide  (DEMADEX ) 20 MG tablet Take  4 tablets (80 mg total) by mouth daily. 120 tablet 0   No current facility-administered medications for this encounter.   No Known Allergies  Social History   Socioeconomic History   Marital status: Single    Spouse name: Not on file   Number of children: Not on file   Years of education: Not on file   Highest education level: Not on file  Occupational History   Not on file  Tobacco Use   Smoking status: Every Day    Current packs/day: 1.00    Average packs/day: 1 pack/day for 16.0 years (16.0 ttl pk-yrs)    Types: Cigarettes   Smokeless tobacco: Never  Vaping Use   Vaping status: Some Days   Substances: Nicotine   Substance and Sexual Activity   Alcohol use: Not Currently    Comment: Pt stated 2 years clean   Drug use: Not Currently    Comment: Pt stated It was opiates   Sexual activity: Not on file  Other Topics Concern   Not on file  Social History Narrative   Not on file   Social Drivers of Health   Financial Resource Strain: High Risk (11/17/2023)   Overall Financial Resource Strain (CARDIA)    Difficulty of Paying Living Expenses: Hard  Food Insecurity: Food Insecurity Present (11/17/2023)   Hunger Vital Sign    Worried About Running Out of Food in the Last Year: Sometimes true    Ran Out of Food in the Last Year: Sometimes true  Transportation Needs: No Transportation Needs (11/17/2023)   PRAPARE - Administrator, Civil Service (Medical): No    Lack of Transportation (Non-Medical): No  Physical Activity: Not on file  Stress: Not on file  Social Connections: Not on file  Intimate Partner Violence: Not At Risk (10/22/2023)   Humiliation, Afraid, Rape, and Kick questionnaire    Fear of Current or Ex-Partner: No    Emotionally Abused: No    Physically Abused: No    Sexually Abused: No   Family History  Problem Relation Age of Onset   Hypertension Mother    Hypertension Father    BP 128/84   Pulse (!) 126   Wt (!) 161.5 kg (356 lb)   SpO2  94%   BMI 49.65 kg/m   Wt Readings from Last 3 Encounters:  12/15/23 (!) 161.5 kg (356 lb)  11/17/23 (!) 163.2 kg (359 lb 12.8 oz)  10/27/23 (!) 165.5 kg (364 lb 12.8 oz)    PHYSICAL EXAM: General:  NAD. No resp difficulty, walked into clinic, fatigued-appearing HEENT: Normal Neck: Supple. No JVD. Thick neck  Cor: Tachy rate & rhythm. No rubs, gallops or murmurs. Lungs: Clear Abdomen: Soft, obese, nontender, nondistended.  Extremities: No cyanosis, clubbing, rash, edema Neuro: Alert & oriented x 3, moves all 4 extremities w/o difficulty. Affect pleasant.  ECG (personally reviewed): ST 116    ASSESSMENT & PLAN:  1. Chronic Systolic HF - Echo 9/21 EF 20-25% RV moderately HK. Suspect HTN vs PVC-mediated (PVC burden 4.3% - probably not high enough to cause CM) - LHC 1/23 no CAD EF < 10% - CPX 2/23 pVO2: 21.6 (87% predicted peak VO2) corrected for ibw pVO2 41.6 ml/kg (ibw)/min (99% of the ibw-adjusted predicted). Slope:45 O2pulse:  19 (90% pred) - Echo 5/23, 2/24, and 11/24: EF 20-25% RV moderately HK - CPX test reassuring VO2 but slope high  - Remains tenuous NYHA III-IIIb, volume status OK today. - Continue torsemide  80 mg daily + 40 KCL daily.  - Continue Toprol  XL 25 mg daily. - Continue ivabradine  7.5 mg bid - Continue Entresto  24/26 mg bid - Continue digoxin  0.125 mg daily. Dig level < 0.4 on 10/27/23 - Continue spiro 25 mg daily. - Continue Farxiga  10 mg daily - has met with EP but decided to defer ICD - not candidate for advanced therapies currently given social situation - Labs today.  2. Sinus Tach  - Rate in 108-110 range today, jsut took his meds. - Continue Toprol  XL  25 mg daily. - Continue Ivabradine  7.5 mg bid.  - Continue digoxin  0.125 mg daily.   3.  H/o Frequent PVCs - Zio 12/21 4.3% PVCs - probably not high enough burden to cause CM  - Has severe OSA.   4.  Morbid obesity - Body mass index is 49.65 kg/m. - Remains on GLP1RA without much success - ?  Switch to tirzepatide or retatrutide (when available) would results in more meaningful weight loss  5. OSA, Severe - Not on CPAP yet.  - Cannot afford. Engaged HFSW for resources.  6.  Tobacco use - Still smoking 1/2 ppd  - Discussed cessation - Says nicotine  patches don't work  7. H/O IVDA (heroin) - Last used 2020  - No change  8. SDOH needs - HFSW helping with housing issues  Follow up in 3 months with APP.  Harlene CHRISTELLA Gainer, FNP 10:35 AM

## 2023-12-24 ENCOUNTER — Telehealth (HOSPITAL_COMMUNITY): Payer: Self-pay

## 2023-12-24 NOTE — Telephone Encounter (Signed)
 Received a fax requesting medical records from Disability [employer]. Records were successfully placed up front to be mailed to: [place] [facility] [address] [place], [place] [address] ,which was the number provided.. Medical request form will be scanned into patients chart.

## 2024-01-03 ENCOUNTER — Other Ambulatory Visit (HOSPITAL_BASED_OUTPATIENT_CLINIC_OR_DEPARTMENT_OTHER): Payer: Self-pay

## 2024-01-11 ENCOUNTER — Other Ambulatory Visit (HOSPITAL_COMMUNITY): Payer: Self-pay

## 2024-01-12 ENCOUNTER — Other Ambulatory Visit: Payer: Self-pay

## 2024-01-12 ENCOUNTER — Other Ambulatory Visit (HOSPITAL_COMMUNITY): Payer: Self-pay

## 2024-01-14 ENCOUNTER — Other Ambulatory Visit (HOSPITAL_COMMUNITY): Payer: Self-pay

## 2024-02-03 ENCOUNTER — Other Ambulatory Visit: Payer: Self-pay

## 2024-02-03 ENCOUNTER — Inpatient Hospital Stay (HOSPITAL_BASED_OUTPATIENT_CLINIC_OR_DEPARTMENT_OTHER)
Admission: EM | Admit: 2024-02-03 | Discharge: 2024-02-04 | DRG: 193 | Discharge status: Against Medical Advice | Payer: MEDICAID | Attending: Internal Medicine | Admitting: Internal Medicine

## 2024-02-03 ENCOUNTER — Emergency Department (HOSPITAL_BASED_OUTPATIENT_CLINIC_OR_DEPARTMENT_OTHER): Payer: MEDICAID | Admitting: Radiology

## 2024-02-03 DIAGNOSIS — Z5329 Procedure and treatment not carried out because of patient's decision for other reasons: Secondary | ICD-10-CM | POA: Diagnosis present

## 2024-02-03 DIAGNOSIS — Z7984 Long term (current) use of oral hypoglycemic drugs: Secondary | ICD-10-CM | POA: Diagnosis not present

## 2024-02-03 DIAGNOSIS — Z8249 Family history of ischemic heart disease and other diseases of the circulatory system: Secondary | ICD-10-CM | POA: Diagnosis not present

## 2024-02-03 DIAGNOSIS — J189 Pneumonia, unspecified organism: Principal | ICD-10-CM | POA: Diagnosis present

## 2024-02-03 DIAGNOSIS — Z7985 Long-term (current) use of injectable non-insulin antidiabetic drugs: Secondary | ICD-10-CM

## 2024-02-03 DIAGNOSIS — I5023 Acute on chronic systolic (congestive) heart failure: Secondary | ICD-10-CM | POA: Diagnosis present

## 2024-02-03 DIAGNOSIS — Z6841 Body Mass Index (BMI) 40.0 and over, adult: Secondary | ICD-10-CM | POA: Diagnosis not present

## 2024-02-03 DIAGNOSIS — Z716 Tobacco abuse counseling: Secondary | ICD-10-CM

## 2024-02-03 DIAGNOSIS — Z79899 Other long term (current) drug therapy: Secondary | ICD-10-CM | POA: Diagnosis not present

## 2024-02-03 DIAGNOSIS — I509 Heart failure, unspecified: Secondary | ICD-10-CM

## 2024-02-03 DIAGNOSIS — J159 Unspecified bacterial pneumonia: Secondary | ICD-10-CM | POA: Diagnosis present

## 2024-02-03 DIAGNOSIS — Z1152 Encounter for screening for COVID-19: Secondary | ICD-10-CM | POA: Diagnosis not present

## 2024-02-03 DIAGNOSIS — E66813 Obesity, class 3: Secondary | ICD-10-CM | POA: Diagnosis present

## 2024-02-03 DIAGNOSIS — J1008 Influenza due to other identified influenza virus with other specified pneumonia: Secondary | ICD-10-CM | POA: Diagnosis present

## 2024-02-03 DIAGNOSIS — Z5986 Financial insecurity: Secondary | ICD-10-CM

## 2024-02-03 DIAGNOSIS — R748 Abnormal levels of other serum enzymes: Secondary | ICD-10-CM | POA: Diagnosis present

## 2024-02-03 DIAGNOSIS — G4733 Obstructive sleep apnea (adult) (pediatric): Secondary | ICD-10-CM | POA: Diagnosis present

## 2024-02-03 DIAGNOSIS — R0602 Shortness of breath: Secondary | ICD-10-CM | POA: Diagnosis present

## 2024-02-03 DIAGNOSIS — F1121 Opioid dependence, in remission: Secondary | ICD-10-CM | POA: Diagnosis present

## 2024-02-03 DIAGNOSIS — I493 Ventricular premature depolarization: Secondary | ICD-10-CM | POA: Diagnosis present

## 2024-02-03 DIAGNOSIS — J101 Influenza due to other identified influenza virus with other respiratory manifestations: Secondary | ICD-10-CM | POA: Insufficient documentation

## 2024-02-03 DIAGNOSIS — F17210 Nicotine dependence, cigarettes, uncomplicated: Secondary | ICD-10-CM | POA: Diagnosis present

## 2024-02-03 DIAGNOSIS — I110 Hypertensive heart disease with heart failure: Secondary | ICD-10-CM | POA: Diagnosis present

## 2024-02-03 LAB — CBC WITH DIFFERENTIAL/PLATELET
Abs Immature Granulocytes: 0.16 K/uL — ABNORMAL HIGH (ref 0.00–0.07)
Basophils Absolute: 0.1 K/uL (ref 0.0–0.1)
Basophils Relative: 1 %
Eosinophils Absolute: 0 K/uL (ref 0.0–0.5)
Eosinophils Relative: 0 %
HCT: 40.2 % (ref 39.0–52.0)
Hemoglobin: 12.7 g/dL — ABNORMAL LOW (ref 13.0–17.0)
Immature Granulocytes: 1 %
Lymphocytes Relative: 6 %
Lymphs Abs: 1.2 K/uL (ref 0.7–4.0)
MCH: 25.1 pg — ABNORMAL LOW (ref 26.0–34.0)
MCHC: 31.6 g/dL (ref 30.0–36.0)
MCV: 79.4 fL — ABNORMAL LOW (ref 80.0–100.0)
Monocytes Absolute: 1.6 K/uL — ABNORMAL HIGH (ref 0.1–1.0)
Monocytes Relative: 9 %
Neutro Abs: 15.1 K/uL — ABNORMAL HIGH (ref 1.7–7.7)
Neutrophils Relative %: 83 %
Platelets: 342 K/uL (ref 150–400)
RBC: 5.06 MIL/uL (ref 4.22–5.81)
RDW: 18.5 % — ABNORMAL HIGH (ref 11.5–15.5)
WBC: 18.1 K/uL — ABNORMAL HIGH (ref 4.0–10.5)
nRBC: 0.2 % (ref 0.0–0.2)

## 2024-02-03 LAB — RESP PANEL BY RT-PCR (RSV, FLU A&B, COVID)  RVPGX2
Influenza A by PCR: NEGATIVE
Influenza B by PCR: NEGATIVE
Resp Syncytial Virus by PCR: NEGATIVE
SARS Coronavirus 2 by RT PCR: NEGATIVE

## 2024-02-03 LAB — COMPREHENSIVE METABOLIC PANEL WITH GFR
ALT: 22 U/L (ref 0–44)
AST: 18 U/L (ref 15–41)
Albumin: 3.7 g/dL (ref 3.5–5.0)
Alkaline Phosphatase: 96 U/L (ref 38–126)
Anion gap: 14 (ref 5–15)
BUN: 14 mg/dL (ref 6–20)
CO2: 21 mmol/L — ABNORMAL LOW (ref 22–32)
Calcium: 9.1 mg/dL (ref 8.9–10.3)
Chloride: 100 mmol/L (ref 98–111)
Creatinine, Ser: 0.98 mg/dL (ref 0.61–1.24)
GFR, Estimated: >60 mL/min (ref >60)
Glucose, Bld: 120 mg/dL — ABNORMAL HIGH (ref 70–99)
Potassium: 4 mmol/L (ref 3.5–5.1)
Sodium: 136 mmol/L (ref 135–145)
Total Bilirubin: 1.1 mg/dL (ref 0.0–1.2)
Total Protein: 7.2 g/dL (ref 6.5–8.1)

## 2024-02-03 LAB — LACTIC ACID, PLASMA
Lactic Acid, Venous: 1.2 mmol/L (ref 0.5–1.9)
Lactic Acid, Venous: 1.2 mmol/L (ref 0.5–1.9)

## 2024-02-03 LAB — TROPONIN T, HIGH SENSITIVITY
Troponin T High Sensitivity: 61 ng/L — ABNORMAL HIGH (ref 0–19)
Troponin T High Sensitivity: 58 ng/L — ABNORMAL HIGH (ref 0–19)

## 2024-02-03 LAB — PRO BRAIN NATRIURETIC PEPTIDE: Pro Brain Natriuretic Peptide: 4377 pg/mL — ABNORMAL HIGH (ref <300.0)

## 2024-02-03 MED ORDER — ACETAMINOPHEN 325 MG PO TABS
650.0000 mg | ORAL_TABLET | Freq: Four times a day (QID) | ORAL | Status: DC | PRN
  Administered 2024-02-04: 650 mg via ORAL
  Filled 2024-02-03: qty 2

## 2024-02-03 MED ORDER — IVABRADINE HCL 5 MG PO TABS
7.5000 mg | ORAL_TABLET | Freq: Two times a day (BID) | ORAL | Status: DC
  Administered 2024-02-04: 7.5 mg via ORAL
  Filled 2024-02-03 (×2): qty 1

## 2024-02-03 MED ORDER — ONDANSETRON HCL 4 MG/2ML IJ SOLN: 4.0000 mg | Freq: Four times a day (QID) | INTRAMUSCULAR | Status: DC | PRN

## 2024-02-03 MED ORDER — FUROSEMIDE 10 MG/ML IJ SOLN
40.0000 mg | Freq: Two times a day (BID) | INTRAMUSCULAR | Status: DC
  Administered 2024-02-04: 40 mg via INTRAVENOUS
  Filled 2024-02-03: qty 4

## 2024-02-03 MED ORDER — LACTATED RINGERS IV BOLUS: 500.0000 mL | Freq: Once | INTRAVENOUS | Status: DC

## 2024-02-03 MED ORDER — SODIUM CHLORIDE 0.9 % IV SOLN
500.0000 mg | Freq: Once | INTRAVENOUS | Status: AC
  Administered 2024-02-03: 500 mg via INTRAVENOUS
  Filled 2024-02-03: qty 5

## 2024-02-03 MED ORDER — LIDOCAINE 5 % EX PTCH
1.0000 | MEDICATED_PATCH | Freq: Once | CUTANEOUS | Status: AC
Start: 2024-02-03 — End: 2024-02-04
  Administered 2024-02-03: 1 via TRANSDERMAL
  Filled 2024-02-03: qty 1

## 2024-02-03 MED ORDER — DIGOXIN 125 MCG PO TABS
0.1250 mg | ORAL_TABLET | Freq: Every day | ORAL | Status: DC
  Administered 2024-02-04: 0.125 mg via ORAL
  Filled 2024-02-03: qty 1

## 2024-02-03 MED ORDER — SODIUM CHLORIDE 0.9 % IV SOLN
2.0000 g | INTRAVENOUS | Status: DC
  Administered 2024-02-04: 2 g via INTRAVENOUS
  Filled 2024-02-03: qty 20

## 2024-02-03 MED ORDER — SODIUM CHLORIDE 0.9 % IV SOLN
1.0000 g | Freq: Once | INTRAVENOUS | Status: AC
  Administered 2024-02-03: 1 g via INTRAVENOUS
  Filled 2024-02-03: qty 10

## 2024-02-03 MED ORDER — ONDANSETRON HCL 4 MG PO TABS: 4.0000 mg | ORAL_TABLET | Freq: Four times a day (QID) | ORAL | Status: DC | PRN

## 2024-02-03 MED ORDER — METOPROLOL SUCCINATE ER 25 MG PO TB24
25.0000 mg | ORAL_TABLET | Freq: Every day | ORAL | Status: DC
  Administered 2024-02-04: 25 mg via ORAL
  Filled 2024-02-03: qty 1

## 2024-02-03 MED ORDER — SACUBITRIL-VALSARTAN 24-26 MG PO TABS
1.0000 | ORAL_TABLET | Freq: Two times a day (BID) | ORAL | Status: DC
  Administered 2024-02-04 (×2): 1 via ORAL
  Filled 2024-02-03 (×2): qty 1

## 2024-02-03 MED ORDER — LACTATED RINGERS IV BOLUS
250.0000 mL | Freq: Once | INTRAVENOUS | Status: AC
  Administered 2024-02-03: 250 mL via INTRAVENOUS

## 2024-02-03 MED ORDER — POTASSIUM CHLORIDE CRYS ER 20 MEQ PO TBCR
40.0000 meq | EXTENDED_RELEASE_TABLET | Freq: Two times a day (BID) | ORAL | Status: DC
  Administered 2024-02-04 (×2): 40 meq via ORAL
  Filled 2024-02-03 (×2): qty 2

## 2024-02-03 MED ORDER — ACETAMINOPHEN 500 MG PO TABS
1000.0000 mg | ORAL_TABLET | Freq: Once | ORAL | Status: AC
  Administered 2024-02-03: 1000 mg via ORAL
  Filled 2024-02-03: qty 2

## 2024-02-03 MED ORDER — HEPARIN SODIUM (PORCINE) 5000 UNIT/ML IJ SOLN
5000.0000 [IU] | Freq: Three times a day (TID) | INTRAMUSCULAR | Status: DC
  Administered 2024-02-04: 5000 [IU] via SUBCUTANEOUS
  Filled 2024-02-03: qty 1

## 2024-02-03 MED ORDER — SPIRONOLACTONE 25 MG PO TABS
25.0000 mg | ORAL_TABLET | Freq: Every day | ORAL | Status: DC
  Administered 2024-02-04: 25 mg via ORAL
  Filled 2024-02-03: qty 1

## 2024-02-03 MED ORDER — DAPAGLIFLOZIN PROPANEDIOL 10 MG PO TABS
10.0000 mg | ORAL_TABLET | Freq: Every day | ORAL | Status: DC
  Administered 2024-02-04: 10 mg via ORAL
  Filled 2024-02-03: qty 1

## 2024-02-03 MED ORDER — SODIUM CHLORIDE 0.9 % IV SOLN
500.0000 mg | INTRAVENOUS | Status: DC
  Filled 2024-02-03: qty 5

## 2024-02-03 MED ORDER — ALBUTEROL SULFATE (2.5 MG/3ML) 0.083% IN NEBU: 2.5000 mg | INHALATION_SOLUTION | RESPIRATORY_TRACT | Status: DC | PRN

## 2024-02-03 MED ORDER — ACETAMINOPHEN 650 MG RE SUPP: 650.0000 mg | Freq: Four times a day (QID) | RECTAL | Status: DC | PRN

## 2024-02-03 MED ORDER — FUROSEMIDE 10 MG/ML IJ SOLN
40.0000 mg | Freq: Once | INTRAMUSCULAR | Status: AC
  Administered 2024-02-03: 40 mg via INTRAVENOUS
  Filled 2024-02-03: qty 4

## 2024-02-03 NOTE — ED Triage Notes (Signed)
 C/o SHOB, body aches, and cough x 2 days. NAD.

## 2024-02-03 NOTE — ED Notes (Signed)
 Lauren with called for transport

## 2024-02-03 NOTE — ED Notes (Signed)
 Pt refusing lasix  at this time. Pt states he does not want to be stuck in the ambulance having to urinate. States he has to stand up to urinate. I let pt know we do not know how long it will take for him to get a room. DO aware.

## 2024-02-03 NOTE — H&P (Signed)
 History and Physical    Donald Murray FMW:[id number] DOB: 01-13-1986 DOA: 02/03/2024  PCP: Patient, [name]  Patient coming from: home  I have personally briefly reviewed patient's old medical records in [facility] Link  Chief Complaint: sob  HPI: Donald Murray is a 38 y.o. male with medical history significant of  HTN, previous IVDA (heroin), tobacco use and systolic HF (onset 9/21) Severe OSA,and Obesity presents to ED with complaint of sob , cough and body aches x 2 days.   ED Course:  Afeb , bp 132/99, hr 55, rr 19 sat 98%  RVP:neg  EKG: sinus tachycardia with pvc LAD Wbc 18.1, hgb 12.7 plt 342,  left shift  Na 136, K 4, Cl 100, glu 120, cr 0.98 CE 61 Probnp 4,377 Lactic 1.2 Tx: Lr 250 cc x 1, ctx,zithromax ,tylenol  , lasix  40mg  iv x 1 Review of Systems: As per HPI otherwise 10 point review of systems negative.   Past Medical History:  Diagnosis Date   Abdominal hernia    CHF (congestive heart failure) (HCC)    Hypertension    Obesity    Snoring     Past Surgical History:  Procedure Laterality Date   I & D EXTREMITY Right 03/04/2019   Procedure: IRRIGATION AND DEBRIDEMENT OF RIGHT ELBOW;  Surgeon: Camella Fallow, MD;  Location: [facility] OR;  Service: Orthopedics;  Laterality: Right;   IRRIGATION AND DEBRIDEMENT ABSCESS Left 03/04/2019   Procedure: Irrigation And Debridement Abscess Left hand;  Surgeon: Camella Fallow, MD;  Location: [facility] OR;  Service: Orthopedics;  Laterality: Left;   RIGHT/LEFT HEART CATH AND CORONARY ANGIOGRAPHY N/A 06/12/2021   Procedure: RIGHT/LEFT HEART CATH AND CORONARY ANGIOGRAPHY;  Surgeon: Cherrie Toribio SAUNDERS, MD;  Location: [facility] INVASIVE CV LAB;  Service: Cardiovascular;  Laterality: N/A;   UMBILICAL HERNIA REPAIR       reports that he has been smoking cigarettes. He has a 16 pack-year smoking history. He has never used smokeless tobacco. He reports that he does not currently use alcohol. He reports that he does not currently use drugs.  No  Known Allergies  Family History  Problem Relation Age of Onset   Hypertension Mother    Hypertension Father     Prior to Admission medications   Medication Sig Start Date End Date Taking? Authorizing Provider  acetaminophen  (TYLENOL ) 500 MG tablet Take 1 tablet (500 mg total) by mouth every 6 (six) hours as needed. 09/19/23  Yes Neysa Caron PARAS, DO  dapagliflozin  propanediol (FARXIGA ) 10 MG TABS tablet Take 1 tablet (10 mg total) by mouth daily before breakfast. 10/27/23  Yes Milford, Harlene HERO, FNP  digoxin  (LANOXIN ) 0.125 MG tablet Take 1 tablet (0.125 mg total) by mouth daily. 10/23/23  Yes Danford, Lonni SQUIBB, MD  ivabradine  (CORLANOR ) 7.5 MG TABS tablet Take 1 tablet (7.5 mg total) by mouth 2 (two) times daily with a meal. 11/17/23  Yes Bensimhon, Toribio SAUNDERS, MD  metoprolol  succinate (TOPROL  XL) 25 MG 24 hr tablet Take 1 tablet (25 mg total) by mouth daily. 11/17/23  Yes Bensimhon, Toribio SAUNDERS, MD  potassium chloride  SA (KLOR-CON  M) 20 MEQ tablet Take 2 tablets (40 mEq total) by mouth 2 (two) times daily. 10/27/23  Yes Milford, Harlene HERO, FNP  sacubitril -valsartan  (ENTRESTO ) 24-26 MG Take 1 tablet by mouth 2 (two) times daily. 10/27/23  Yes Milford, Harlene HERO, FNP  Semaglutide -Weight Management (WEGOVY ) 2.4 MG/0.75ML SOAJ Inject 2.4 mg into the skin once a week. 09/14/23  Yes Pietro Redell RAMAN, MD  spironolactone  (ALDACTONE ) 25 MG tablet Take 1 tablet (25 mg total) by mouth daily. 10/23/23  Yes Danford, Lonni SQUIBB, MD  torsemide  (DEMADEX ) 20 MG tablet Take 4 tablets (80 mg total) by mouth daily. 10/23/23  Yes Jonel Lonni SQUIBB, MD    Physical Exam: Vitals:   02/03/24 1800 02/03/24 1805 02/03/24 1902 02/03/24 1906  BP: 126/87 120/69 119/75 119/75  Pulse: 81 93 (!) 105 (!) 102  Resp:  20 18   Temp:  97.8 F (36.6 C) 98.9 F (37.2 C)   TempSrc:  Oral Oral   SpO2: 98% 95% 97% 97%  Weight:   (!) 167.1 kg   Height:   5' 11 (1.803 m)     Constitutional: NAD, calm, comfortable Vitals:    02/03/24 1800 02/03/24 1805 02/03/24 1902 02/03/24 1906  BP: 126/87 120/69 119/75 119/75  Pulse: 81 93 (!) 105 (!) 102  Resp:  20 18   Temp:  97.8 F (36.6 C) 98.9 F (37.2 C)   TempSrc:  Oral Oral   SpO2: 98% 95% 97% 97%  Weight:   (!) 167.1 kg   Height:   5' 11 (1.803 m)    Eyes: PERRL, lids and conjunctivae normal ENMT: Mucous membranes are moist. Posterior pharynx clear of any exudate or lesions.Normal dentition.  Neck: normal, supple, no masses, no thyromegaly Respiratory: clear to auscultation bilaterally, no wheezing, no crackles. Normal respiratory effort. No accessory muscle use.  Cardiovascular: Regular rate and rhythm, no murmurs / rubs / gallops. No extremity edema. 2+ pedal pulses. No carotid bruits.  Abdomen: no tenderness, no masses palpated. No hepatosplenomegaly. Bowel sounds positive.  Musculoskeletal: no clubbing / cyanosis. No joint deformity upper and lower extremities. Good ROM, no contractures. Normal muscle tone.  Skin: no rashes, lesions, ulcers. No induration Neurologic: CN 2-12 grossly intact. Sensation intact, DTR normal. Strength 5/5 in all 4.  Psychiatric: Normal judgment and insight. Alert and oriented x 3. Normal mood.    Labs on Admission: I have personally reviewed following labs and imaging studies  CBC: Recent Labs  Lab 02/03/24 1145  WBC 18.1*  NEUTROABS 15.1*  HGB 12.7*  HCT 40.2  MCV 79.4*  PLT 342   Basic Metabolic Panel: Recent Labs  Lab 02/03/24 1145  NA 136  K 4.0  CL 100  CO2 21*  GLUCOSE 120*  BUN 14  CREATININE 0.98  CALCIUM 9.1   GFR: Estimated Creatinine Clearance: 163.5 mL/min (by C-G formula based on SCr of 0.98 mg/dL). Liver Function Tests: Recent Labs  Lab 02/03/24 1145  AST 18  ALT 22  ALKPHOS 96  BILITOT 1.1  PROT 7.2  ALBUMIN 3.7   No results for input(s): LIPASE, AMYLASE in the last 168 hours. No results for input(s): AMMONIA in the last 168 hours. Coagulation Profile: No results for  input(s): INR, PROTIME in the last 168 hours. Cardiac Enzymes: No results for input(s): CKTOTAL, CKMB, CKMBINDEX, TROPONINI in the last 168 hours. BNP (last 3 results) Recent Labs    02/03/24 1145  PROBNP 4,377.0*   HbA1C: No results for input(s): HGBA1C in the last 72 hours. CBG: No results for input(s): GLUCAP in the last 168 hours. Lipid Profile: No results for input(s): CHOL, HDL, LDLCALC, TRIG, CHOLHDL, LDLDIRECT in the last 72 hours. Thyroid Function Tests: No results for input(s): TSH, T4TOTAL, FREET4, T3FREE, THYROIDAB in the last 72 hours. Anemia Panel: No results for input(s): VITAMINB12, FOLATE, FERRITIN, TIBC, IRON, RETICCTPCT in the last 72 hours. Urine analysis:  Component Value Date/Time   COLORURINE YELLOW 09/19/2023 1132   APPEARANCEUR CLEAR 09/19/2023 1132   LABSPEC 1.029 09/19/2023 1132   PHURINE 6.0 09/19/2023 1132   GLUCOSEU NEGATIVE 09/19/2023 1132   HGBUR TRACE (A) 09/19/2023 1132   BILIRUBINUR NEGATIVE 09/19/2023 1132   KETONESUR NEGATIVE 09/19/2023 1132   PROTEINUR 30 (A) 09/19/2023 1132   NITRITE NEGATIVE 09/19/2023 1132   LEUKOCYTESUR NEGATIVE 09/19/2023 1132    Radiological Exams on Admission: DG Chest 2 View Result Date: 02/03/2024 CLINICAL DATA:  Shortness of breath with body aches and cough for 2 days. EXAM: CHEST - 2 VIEW COMPARISON:  CT and radiographs 10/21/2023. FINDINGS: The heart is enlarged but stable. There is mildly increased vascular congestion with new patchy airspace disease in the right middle lobe. Possible residual small bilateral pleural effusions. There is no pneumothorax. The bones appear unchanged. IMPRESSION: 1. New patchy airspace disease in the right middle lobe suspicious for pneumonia. 2. Stable cardiomegaly with mildly increased vascular congestion and possible small bilateral pleural effusions. Electronically Signed   By: Elsie Perone M.D.   On: 02/03/2024 10:40     EKG: Independently reviewed.  Assessment/Plan CAP -patient with increase wbc and  Opacities on cxr  -continue ctx/ azithromycin , de-escalate as able  -pulmonary toilet  -rvp negative  -urine ag, sputum, f/u on culture data    Acute on Chronic CHFref -lasix  40mg  iv bid  -monitor closely for over-diuresis -ekg without hyperacute st -twave changes  -continue on GDMT:toprol ,ivabradine , entresto , digoxin ,spironolactone ,farxiga   Abnormal Cardiac Enzymes -in setting of CAP/CHF - presumed due to demand  - continue to cycle  -f/u with echo in am   Hypertension  -resume home regimen once med rec complete  -elevated on admit , improved s/p lasix    Sinus tachycardia Hx of PVC -continue metoprolol , ivabradine , digoxin  -ensure lytes replete  Morbid Obesity  -on GLP1  OSA severe -not on cpap  -per notes not able to afford   Tobacco abuse -encourage cessation      DVT prophylaxis: heparin  Code Status: full/ as discussed per patient wishes in event of cardiac arrest  Family Communication: none at bedside Disposition Plan: patient  expected to be admitted greater than 2 midnights  Consults called: n/a Admission status: progressive   Camila DELENA Ned MD [facility]   If 7PM-7AM, please contact night-coverage [email or web address] Password [email or web address]  02/03/2024, 9:54 PM

## 2024-02-03 NOTE — ED Notes (Signed)
Blood cultures drawn before start of antibiotics  

## 2024-02-03 NOTE — ED Provider Notes (Signed)
 [place] [facility]MENT AT [facility] Provider Note   CSN: [id number] Arrival date & time: 02/03/24  1000     Patient presents with: Shortness of Breath   Eman Morimoto is a 38 y.o. male.   This is a 38 year old male presenting emergency department for shortness of breath.  Reports worsening dyspnea on exertion and orthopnea x 2 days.  Increased cough with sputum production.  Reports generalized malaise, chills.  He had bodyaches.  No nausea no vomiting.  Compliant with his home CHF meds   Shortness of Breath      Prior to Admission medications   Medication Sig Start Date End Date Taking? Authorizing Provider  acetaminophen  (TYLENOL ) 500 MG tablet Take 1 tablet (500 mg total) by mouth every 6 (six) hours as needed. 09/19/23   Neysa Caron PARAS, DO  dapagliflozin  propanediol (FARXIGA ) 10 MG TABS tablet Take 1 tablet (10 mg total) by mouth daily before breakfast. 10/27/23   Milford, Harlene HERO, FNP  digoxin  (LANOXIN ) 0.125 MG tablet Take 1 tablet (0.125 mg total) by mouth daily. 10/23/23   Danford, Lonni SQUIBB, MD  ivabradine  (CORLANOR ) 7.5 MG TABS tablet Take 1 tablet (7.5 mg total) by mouth 2 (two) times daily with a meal. 11/17/23   Bensimhon, Toribio SAUNDERS, MD  metoprolol  succinate (TOPROL  XL) 25 MG 24 hr tablet Take 1 tablet (25 mg total) by mouth daily. 11/17/23   Bensimhon, Toribio SAUNDERS, MD  potassium chloride  SA (KLOR-CON  M) 20 MEQ tablet Take 2 tablets (40 mEq total) by mouth 2 (two) times daily. 10/27/23   Milford, Harlene HERO, FNP  sacubitril -valsartan  (ENTRESTO ) 24-26 MG Take 1 tablet by mouth 2 (two) times daily. 10/27/23   Glena Harlene HERO, FNP  Semaglutide -Weight Management (WEGOVY ) 2.4 MG/0.75ML SOAJ Inject 2.4 mg into the skin once a week. 09/14/23   Pietro Redell RAMAN, MD  spironolactone  (ALDACTONE ) 25 MG tablet Take 1 tablet (25 mg total) by mouth daily. 10/23/23   Danford, Lonni SQUIBB, MD  torsemide  (DEMADEX ) 20 MG tablet Take 4 tablets (80 mg total) by mouth daily.  10/23/23   Danford, Lonni SQUIBB, MD    Allergies: Patient has no known allergies.    Review of Systems  Respiratory:  Positive for shortness of breath.     Updated Vital Signs BP 114/88 (BP Location: Left Arm)   Pulse (!) 115   Temp 98.3 F (36.8 C) (Oral)   Resp (!) 21   SpO2 96%   Physical Exam Vitals and nursing note reviewed.  Constitutional:      Appearance: He is ill-appearing.     Comments: Diaphoretic  HENT:     Head: Normocephalic.  Cardiovascular:     Rate and Rhythm: Regular rhythm. Tachycardia present.  Pulmonary:     Effort: Pulmonary effort is normal.     Breath sounds: Rhonchi present.  Musculoskeletal:     Right lower leg: Edema present.     Left lower leg: Edema present.  Skin:    General: Skin is warm.     Capillary Refill: Capillary refill takes less than 2 seconds.  Neurological:     Mental Status: He is alert and oriented to person, place, and time.  Psychiatric:        Mood and Affect: Mood normal.        Behavior: Behavior normal.     (all labs ordered are listed, but only abnormal results are displayed) Labs Reviewed  CBC WITH DIFFERENTIAL/PLATELET - Abnormal; Notable for the following components:  Result Value   WBC 18.1 (*)    Hemoglobin 12.7 (*)    MCV 79.4 (*)    MCH 25.1 (*)    RDW 18.5 (*)    Neutro Abs 15.1 (*)    Monocytes Absolute 1.6 (*)    Abs Immature Granulocytes 0.16 (*)    All other components within normal limits  COMPREHENSIVE METABOLIC PANEL WITH GFR - Abnormal; Notable for the following components:   CO2 21 (*)    Glucose, Bld 120 (*)    All other components within normal limits  PRO BRAIN NATRIURETIC PEPTIDE - Abnormal; Notable for the following components:   Pro Brain Natriuretic Peptide 4,377.0 (*)    All other components within normal limits  TROPONIN T, HIGH SENSITIVITY - Abnormal; Notable for the following components:   Troponin T High Sensitivity 61 (*)    All other components within normal limits   TROPONIN T, HIGH SENSITIVITY - Abnormal; Notable for the following components:   Troponin T High Sensitivity 58 (*)    All other components within normal limits  RESP PANEL BY RT-PCR (RSV, FLU A&B, COVID)  [id number]  CULTURE, BLOOD (ROUTINE X 2)  CULTURE, BLOOD (ROUTINE X 2)  LACTIC ACID, PLASMA  LACTIC ACID, PLASMA    EKG: None  Radiology: DG Chest 2 View Result Date: 02/03/2024 CLINICAL DATA:  Shortness of breath with body aches and cough for 2 days. EXAM: CHEST - 2 VIEW COMPARISON:  CT and radiographs 10/21/2023. FINDINGS: The heart is enlarged but stable. There is mildly increased vascular congestion with new patchy airspace disease in the right middle lobe. Possible residual small bilateral pleural effusions. There is no pneumothorax. The bones appear unchanged. IMPRESSION: 1. New patchy airspace disease in the right middle lobe suspicious for pneumonia. 2. Stable cardiomegaly with mildly increased vascular congestion and possible small bilateral pleural effusions. Electronically Signed   By: Elsie Perone M.D.   On: 02/03/2024 10:40     Procedures   Medications Ordered in the ED  furosemide  (LASIX ) injection 40 mg (has no administration in time range)  lactated ringers  bolus 250 mL (250 mLs Intravenous New Bag/Given 02/03/24 1155)  cefTRIAXone  (ROCEPHIN ) 1 g in sodium chloride  0.9 % 100 mL IVPB (0 g Intravenous Stopped 02/03/24 1346)  azithromycin  (ZITHROMAX ) 500 mg in sodium chloride  0.9 % 250 mL IVPB (500 mg Intravenous New Bag/Given 02/03/24 1317)    Clinical Course as of 02/03/24 1423  Thu Feb 03, 2024  1126 Last echo with EF of 25%.  [TY]  1126 Patient last tachycardia in the room heart rate in the 110s.  Appears to be normal sinus rhythm.  He is diaphoretic, but does not appear to be in acute respiratory distress.  Maintaining oxygen saturation in the mid 90s.  His flu COVID/RSV is negative.  X-ray done in triage with findings concerning for right middle lobe pneumonia.   Given his elevated heart rate we will get screening labs to evaluate for systemic infection/sepsis also with severely depressed EF notes that he cannot lay flat due to shortness of breath.  Will get cardiac labs, EKG as well. [TY]  1203 WBC(!): 18.1 Antibiotics ordered for pna  [TY]  1302 Pro Brain Natriuretic Peptide(!): 4,377.0 His BNP is typically been in the 400s.  His proBNP now 4377 [TY]  1306 Troponin T High Sensitivity(!): 61 Appears to be baseline [TY]  1306 Comprehensive metabolic panel(!) No significant metabolic derangements.  No transaminitis to suggest hepatobiliary disease [TY]  1306 Resp panel  by RT-PCR (RSV, Flu A&B, Covid) Anterior Nasal Swab Flu COVID-negative [TY]    Clinical Course User Index [TY] Neysa Caron PARAS, DO                                 Medical Decision Making This is a 38 year old male presenting emergency department with cough and shortness of breath.  Past medical history to include morbid obesity and hypertension, and CHF with reduced EF per my chart review.  Reports compliance with his medications.  He is tachycardic, chest x-ray in triage with pneumonia.  Broaden workup to given his comorbidities appears he does have a leukocytosis.  Covering with antibiotics.  He does report symptoms concerning for CHF with his orthopnea and worsening dyspnea on exertion.  His BNP 4377.  Troponin appears to be at baseline.  His lactate is normal.  Flu/COVID/RSV negative as well.  He clinically looks unwell sitting straight up in bed and diaphoretic, does not appear to be in overt respiratory distress.  Given his numerous comorbidities with mixed clinical picture of CHF with superimposed pneumonia will admit for observation and further antibiotics and diuresis.  Amount and/or Complexity of Data Reviewed Labs: ordered. Decision-making details documented in ED Course. Radiology: ordered. ECG/medicine tests: ordered.  Risk Prescription drug management. Decision  regarding hospitalization.       Final diagnoses:  Pneumonia of right middle lobe due to infectious organism  Acute on chronic congestive heart failure, unspecified heart failure type [facility])    ED Discharge Orders     None          Vibhav, Waddill, DO 02/03/24 1423

## 2024-02-04 ENCOUNTER — Encounter (HOSPITAL_COMMUNITY): Payer: Self-pay | Admitting: Internal Medicine

## 2024-02-04 ENCOUNTER — Inpatient Hospital Stay (HOSPITAL_COMMUNITY): Payer: MEDICAID

## 2024-02-04 DIAGNOSIS — I5023 Acute on chronic systolic (congestive) heart failure: Secondary | ICD-10-CM | POA: Diagnosis not present

## 2024-02-04 LAB — STREP PNEUMONIAE URINARY ANTIGEN: Strep Pneumo Urinary Antigen: NEGATIVE

## 2024-02-04 LAB — EXPECTORATED SPUTUM ASSESSMENT W GRAM STAIN, RFLX TO RESP C

## 2024-02-04 LAB — CBC
HCT: 41.5 % (ref 39.0–52.0)
Hemoglobin: 12.8 g/dL — ABNORMAL LOW (ref 13.0–17.0)
MCH: 24.9 pg — ABNORMAL LOW (ref 26.0–34.0)
MCHC: 30.8 g/dL (ref 30.0–36.0)
MCV: 80.6 fL (ref 80.0–100.0)
Platelets: 379 K/uL (ref 150–400)
RBC: 5.15 MIL/uL (ref 4.22–5.81)
RDW: 17.6 % — ABNORMAL HIGH (ref 11.5–15.5)
WBC: 15.5 K/uL — ABNORMAL HIGH (ref 4.0–10.5)
nRBC: 0.3 % — ABNORMAL HIGH (ref 0.0–0.2)

## 2024-02-04 LAB — ECHOCARDIOGRAM COMPLETE
AR max vel: 2.68 cm2
AV Area VTI: 2.92 cm2
AV Area mean vel: 2.56 cm2
AV Mean grad: 2 mmHg
AV Peak grad: 3.6 mmHg
Ao pk vel: 0.95 m/s
Area-P 1/2: 5.97 cm2
Calc EF: 20.5 %
Height: 71 in
S' Lateral: 6.7 cm
Single Plane A2C EF: 14.1 %
Single Plane A4C EF: 21.7 %
Weight: 5890.69 [oz_av]

## 2024-02-04 LAB — COMPREHENSIVE METABOLIC PANEL WITH GFR
ALT: 22 U/L (ref 0–44)
AST: 18 U/L (ref 15–41)
Albumin: 2.9 g/dL — ABNORMAL LOW (ref 3.5–5.0)
Alkaline Phosphatase: 78 U/L (ref 38–126)
Anion gap: 12 (ref 5–15)
BUN: 15 mg/dL (ref 6–20)
CO2: 26 mmol/L (ref 22–32)
Calcium: 8.5 mg/dL — ABNORMAL LOW (ref 8.9–10.3)
Chloride: 98 mmol/L (ref 98–111)
Creatinine, Ser: 1.16 mg/dL (ref 0.61–1.24)
GFR, Estimated: >60 mL/min (ref >60)
Glucose, Bld: 109 mg/dL — ABNORMAL HIGH (ref 70–99)
Potassium: 4.1 mmol/L (ref 3.5–5.1)
Sodium: 136 mmol/L (ref 135–145)
Total Bilirubin: 1.1 mg/dL (ref 0.0–1.2)
Total Protein: 6 g/dL — ABNORMAL LOW (ref 6.5–8.1)

## 2024-02-04 LAB — CREATININE, SERUM
Creatinine, Ser: 1.16 mg/dL (ref 0.61–1.24)
GFR, Estimated: >60 mL/min (ref >60)

## 2024-02-04 LAB — HEMOGLOBIN A1C
Hgb A1c MFr Bld: 5.6 % (ref 4.8–5.6)
Mean Plasma Glucose: 114.02 mg/dL

## 2024-02-04 LAB — PROCALCITONIN: Procalcitonin: 0.52 ng/mL

## 2024-02-04 MED ORDER — PERFLUTREN LIPID MICROSPHERE
1.0000 mL | INTRAVENOUS | Status: DC | PRN
  Administered 2024-02-04: 2 mL via INTRAVENOUS

## 2024-02-04 MED ORDER — PNEUMOCOCCAL 20-VAL CONJ VACC 0.5 ML IM SUSY: 0.5000 mL | PREFILLED_SYRINGE | INTRAMUSCULAR | Status: DC

## 2024-02-04 MED ORDER — GUAIFENESIN ER 600 MG PO TB12: 600.0000 mg | ORAL_TABLET | Freq: Two times a day (BID) | ORAL | Status: DC | PRN

## 2024-02-04 MED ORDER — INFLUENZA VIRUS VACC SPLIT PF (FLUZONE) 0.5 ML IM SUSY: 0.5000 mL | PREFILLED_SYRINGE | INTRAMUSCULAR | Status: DC

## 2024-02-04 MED ORDER — TRAMADOL HCL 50 MG PO TABS
50.0000 mg | ORAL_TABLET | Freq: Four times a day (QID) | ORAL | Status: DC | PRN
  Administered 2024-02-04: 50 mg via ORAL
  Filled 2024-02-04: qty 1

## 2024-02-04 MED ORDER — ACETAMINOPHEN 500 MG PO TABS
1000.0000 mg | ORAL_TABLET | Freq: Once | ORAL | Status: DC
  Filled 2024-02-04: qty 2

## 2024-02-04 NOTE — Plan of Care (Signed)
  Problem: Education: Goal: Knowledge of General Education information will improve Description: Including pain rating scale, medication(s)/side effects and non-pharmacologic comfort measures Outcome: Progressing   Problem: Clinical Measurements: Goal: Ability to maintain clinical measurements within normal limits will improve Outcome: Progressing   Problem: Clinical Measurements: Goal: Will remain free from infection Outcome: Progressing   Problem: Clinical Measurements: Goal: Diagnostic test results will improve Outcome: Progressing   Problem: Coping: Goal: Level of anxiety will decrease Outcome: Progressing   Problem: Safety: Goal: Ability to remain free from injury will improve Outcome: Progressing

## 2024-02-04 NOTE — Progress Notes (Signed)
 Pt was transferred from [facility] medical center to [place] by [employer]. Pt is alert and fully oriented x 4, stable hemodynamically, ST on the monitor, afebrile, SPO2 97% on room air, but he has complaints of shortness of breath with exertion. Pt requests O2 supplement. NCL 2 LPM is given. SPO2 97-100%, no acute distress. Pt is able to ambulate independently in his room. Plan of care is reviewed. The admission team has been notified. We will continue to monitor.     02/03/24 1902  Vitals  Temp 98.9 F (37.2 C)  Temp Source Oral  BP 119/75  MAP (mmHg) 89  BP Location Left Arm  BP Method Automatic  Patient Position (if appropriate) Sitting  Pulse Rate (!) 105  Pulse Rate Source Monitor  ECG Heart Rate (!) 101  Resp 18  Level of Consciousness  Level of Consciousness Alert  MEWS COLOR  MEWS Score Color Green  Oxygen Therapy  SpO2 97 %  O2 Device Room Air  Pain Assessment  Pain Scale 0-10  Pain Score 0   Wendi Dash, RN

## 2024-02-04 NOTE — Progress Notes (Signed)
 Patient requested to be discharged today. MD was notified, and MD stated patient not medically ready for discharge. Patient notified by this nurse, Patient stated he will leave against medical advice. MD notified via secure chat. Verdel LOISE Shams, RN

## 2024-02-04 NOTE — Progress Notes (Signed)
 PROGRESS NOTE    Glenda Kunst  FMW:[id number] DOB: 05-Mar-1986 DOA: 02/03/2024 PCP: Patient, [name]   Brief Narrative:   38 y.o. male with medical history significant of  HTN, previous IVDA (heroin), tobacco use and systolic HF (onset 9/21) Severe OSA (untreated),and Class III Obesity presents to ED with complaint of sob , cough and body aches x 2 days.  He notes chills but no current fever.  Being treated for possible CAP and acute CHF. Symptoms improving.  Assessment & Plan:  Principal Problem:   CAP (community acquired pneumonia) Active Problems:   Acute on chronic systolic CHF (congestive heart failure) (HCC)   Influenza A   Possible bacterial CAP,POA: -continue with ceftriaxone / azithromycin , de-escalate as able  -pulmonary toilet  -rvp negative  -urine ag, sputum, f/u on culture data      Acute on Chronic HFrEF,POA: NYHA III -Last ECHO done in May 2025 showed EF 20-25% with Grade III diastolic (restrictive) dysfunction. -lasix  40mg  iv bid  -Monitor creatinine closely along with K and Mag -continue on GDMT:toprol ,ivabradine , entresto , digoxin ,spironolactone  and farxiga  -Outpatient follow up with cardiologist on the scheduled appointment. -Untreated OSA is playing a significant role in his CHF.   Abnormal Cardiac Enzymes/Troponinemia:Demand supply mismatch -in setting of CAP/CHF  -f/u with echo in am    Hypertension  -Continue close monitoring   Sinus tachycardia Hx of PVC -continue metoprolol , ivabradine , digoxin  -f/u K and Mag   Class III Obesity:  -on GLP1 Semaglutide  2.4 mg weekly.   OSA severe -not on cpap  -per notes not able to afford  -Needs a new sleep study and outpt CPAP arrangement.   Tobacco abuse -encouraged cessation   Disposition: Lives with his roommates.   DVT prophylaxis: heparin  injection 5,000 Units Start: 02/04/24 0600     Code Status: Full Code Family Communication:  None at the bedside Status is: Inpatient Remains  inpatient appropriate because: Acute CHF, PNA    Subjective:  Shortness of breath has improved. He told me about his diagnosis of OSA, made three years back but he couldn't get CPAP because of insurance/financial issues., He doesn't use oxygen at home. He doesn't have a PCP. He lives with his roommates.  Examination:  General exam: Appears calm and comfortable, nasal oxygen cannula in place Respiratory system: Clear to auscultation. Respiratory effort normal. Cardiovascular system: S1 & S2 heard, RRR. No JVD, murmurs, rubs, gallops or clicks. No pedal edema. Gastrointestinal system: Abdomen is nondistended, soft and nontender. No organomegaly or masses felt. Normal bowel sounds heard. Central nervous system: Alert and oriented. No focal neurological deficits. Extremities: Symmetric 5 x 5 power. Skin: 2+ b/l LE pitting edema Psychiatry: Judgement and insight appear normal. Mood & affect appropriate.       Diet Orders (From admission, onward)     Start     Ordered   02/03/24 2329  Diet heart healthy/carb modified Room service appropriate? Yes; Fluid consistency: Thin  Diet effective now       Question Answer Comment  Diet-HS Snack? Nothing   Room service appropriate? Yes   Fluid consistency: Thin      02/03/24 2329            Objective: Vitals:   02/04/24 0237 02/04/24 0415 02/04/24 0500 02/04/24 0740  BP:  108/69  104/72  Pulse: (!) 102 (!) 102  99  Resp: 20 20  20   Temp:  98.5 F (36.9 C)  98 F (36.7 C)  TempSrc:  Oral  Oral  SpO2: 99% 98%  98%  Weight:   (!) 167 kg   Height:        Intake/Output Summary (Last 24 hours) at 02/04/2024 0954 Last data filed at 02/04/2024 0829 Gross per 24 hour  Intake 1590 ml  Output 3040 ml  Net -1450 ml   Filed Weights   02/03/24 1902 02/04/24 0500  Weight: (!) 167.1 kg (!) 167 kg    Scheduled Meds:  acetaminophen   1,000 mg Oral Once   dapagliflozin  propanediol  10 mg Oral QAC breakfast   digoxin   0.125 mg Oral  Daily   furosemide   40 mg Intravenous Q12H   heparin   5,000 Units Subcutaneous Q8H   [START ON 02/05/2024] influenza vac split trivalent PF  0.5 mL Intramuscular Tomorrow-1000   ivabradine   7.5 mg Oral BID WC   metoprolol  succinate  25 mg Oral Daily   [START ON 02/05/2024] pneumococcal 20-valent conjugate vaccine  0.5 mL Intramuscular Tomorrow-1000   potassium chloride  SA  40 mEq Oral BID   sacubitril -valsartan   1 tablet Oral BID   spironolactone   25 mg Oral Daily   Continuous Infusions:  azithromycin      cefTRIAXone  (ROCEPHIN )  IV 2 g (02/04/24 0036)    Nutritional status     Body mass index is 51.35 kg/m.  Data Reviewed:   CBC: Recent Labs  Lab 02/03/24 1145 02/04/24 0014  WBC 18.1* 15.5*  NEUTROABS 15.1*  --   HGB 12.7* 12.8*  HCT 40.2 41.5  MCV 79.4* 80.6  PLT 342 379   Basic Metabolic Panel: Recent Labs  Lab 02/03/24 1145 02/04/24 0014 02/04/24 0015  NA 136 136  --   K 4.0 4.1  --   CL 100 98  --   CO2 21* 26  --   GLUCOSE 120* 109*  --   BUN 14 15  --   CREATININE 0.98 1.16 1.16  CALCIUM 9.1 8.5*  --    GFR: Estimated Creatinine Clearance: 138.1 mL/min (by C-G formula based on SCr of 1.16 mg/dL). Liver Function Tests: Recent Labs  Lab 02/03/24 1145 02/04/24 0014  AST 18 18  ALT 22 22  ALKPHOS 96 78  BILITOT 1.1 1.1  PROT 7.2 6.0*  ALBUMIN 3.7 2.9*   No results for input(s): LIPASE, AMYLASE in the last 168 hours. No results for input(s): AMMONIA in the last 168 hours. Coagulation Profile: No results for input(s): INR, PROTIME in the last 168 hours. Cardiac Enzymes: No results for input(s): CKTOTAL, CKMB, CKMBINDEX, TROPONINI in the last 168 hours. BNP (last 3 results) Recent Labs    02/03/24 1145  PROBNP 4,377.0*   HbA1C: Recent Labs    02/04/24 0014  HGBA1C 5.6   CBG: No results for input(s): GLUCAP in the last 168 hours. Lipid Profile: No results for input(s): CHOL, HDL, LDLCALC, TRIG, CHOLHDL,  LDLDIRECT in the last 72 hours. Thyroid Function Tests: No results for input(s): TSH, T4TOTAL, FREET4, T3FREE, THYROIDAB in the last 72 hours. Anemia Panel: No results for input(s): VITAMINB12, FOLATE, FERRITIN, TIBC, IRON, RETICCTPCT in the last 72 hours. Sepsis Labs: Recent Labs  Lab 02/03/24 1145 02/03/24 1334 02/04/24 0015  PROCALCITON  --   --  0.52  LATICACIDVEN 1.2 1.2  --     Recent Results (from the past 240 hours)  Resp panel by RT-PCR (RSV, Flu A&B, Covid) Anterior Nasal Swab     Status: None   Collection Time: 02/03/24 10:06 AM   Specimen: Anterior Nasal Swab  Result Value Ref  Range Status   SARS Coronavirus 2 by RT PCR NEGATIVE NEGATIVE Final    Comment: (NOTE) SARS-CoV-2 target nucleic acids are NOT DETECTED.  The SARS-CoV-2 RNA is generally detectable in upper respiratory specimens during the acute phase of infection. The lowest concentration of SARS-CoV-2 viral copies this assay can detect is 138 copies/mL. A negative result does not preclude SARS-Cov-2 infection and should not be used as the sole basis for treatment or other patient management decisions. A negative result may occur with  improper specimen collection/handling, submission of specimen other than nasopharyngeal swab, presence of viral mutation(s) within the areas targeted by this assay, and inadequate number of viral copies(<138 copies/mL). A negative result must be combined with clinical observations, patient history, and epidemiological information. The expected result is Negative.  Fact Sheet for Patients:  BloggerCourse.com  Fact Sheet for Healthcare Providers:  SeriousBroker.it  This test is no t yet approved or cleared by the [place]  FDA and  has been authorized for detection and/or diagnosis of SARS-CoV-2 by FDA under an Emergency Use Authorization (EUA). This EUA will remain  in effect (meaning this test  can be used) for the duration of the COVID-19 declaration under Section 564(b)(1) of the Act, 21 U.S.C.section 360bbb-3(b)(1), unless the authorization is terminated  or revoked sooner.       Influenza A by PCR NEGATIVE NEGATIVE Final   Influenza B by PCR NEGATIVE NEGATIVE Final    Comment: (NOTE) The Xpert Xpress SARS-CoV-2/FLU/RSV plus assay is intended as an aid in the diagnosis of influenza from Nasopharyngeal swab specimens and should not be used as a sole basis for treatment. Nasal washings and aspirates are unacceptable for Xpert Xpress SARS-CoV-2/FLU/RSV testing.  Fact Sheet for Patients: BloggerCourse.com  Fact Sheet for Healthcare Providers: SeriousBroker.it  This test is not yet approved or cleared by the [place]  FDA and has been authorized for detection and/or diagnosis of SARS-CoV-2 by FDA under an Emergency Use Authorization (EUA). This EUA will remain in effect (meaning this test can be used) for the duration of the COVID-19 declaration under Section 564(b)(1) of the Act, 21 U.S.C. section 360bbb-3(b)(1), unless the authorization is terminated or revoked.     Resp Syncytial Virus by PCR NEGATIVE NEGATIVE Final    Comment: (NOTE) Fact Sheet for Patients: BloggerCourse.com  Fact Sheet for Healthcare Providers: SeriousBroker.it  This test is not yet approved or cleared by the [place]  FDA and has been authorized for detection and/or diagnosis of SARS-CoV-2 by FDA under an Emergency Use Authorization (EUA). This EUA will remain in effect (meaning this test can be used) for the duration of the COVID-19 declaration under Section 564(b)(1) of the Act, 21 U.S.C. section 360bbb-3(b)(1), unless the authorization is terminated or revoked.  Performed at [facility], [address] [place], [place] [address]   Culture, blood (routine x  2)     Status: None (Preliminary result)   Collection Time: 02/03/24 11:50 AM   Specimen: BLOOD  Result Value Ref Range Status   Specimen Description   Final    BLOOD BLOOD RIGHT FOREARM Performed at [facility], [address] [place], [place] [address]    Special Requests   Final    BOTTLES DRAWN AEROBIC AND ANAEROBIC Blood Culture adequate volume Performed at [facility], [address], [place], [place] [address]    Culture   Final    NO GROWTH < 24 HOURS Performed at [facility], [address] [place], [place]  [address]    Report Status PENDING  Incomplete  Culture, blood (routine x 2)     Status: None (Preliminary result)   Collection Time: 02/03/24 11:55 AM   Specimen: BLOOD  Result Value Ref Range Status   Specimen Description   Final    BLOOD LEFT ANTECUBITAL Performed at [facility], [address] [place], [place] [address]    Special Requests   Final    BOTTLES DRAWN AEROBIC AND ANAEROBIC Blood Culture adequate volume Performed at [facility], [address] [place], [place] [address]    Culture   Final    NO GROWTH < 24 HOURS Performed at [facility], [address], [place], [place] [address]    Report Status PENDING  Incomplete  Expectorated Sputum Assessment w Gram Stain, Rflx to Resp Cult     Status: None   Collection Time: 02/04/24  5:40 AM   Specimen: Expectorated Sputum  Result Value Ref Range Status   Specimen Description EXPECTORATED SPUTUM  Final   Special Requests NONE  Final   Sputum evaluation   Final    THIS SPECIMEN IS ACCEPTABLE FOR SPUTUM CULTURE Performed at [facility], [address], [place], [place] [address]    Report Status 02/04/2024 FINAL  Final  Culture, Respiratory w Gram Stain     Status: None (Preliminary result)   Collection Time: 02/04/24  5:40 AM  Result Value Ref Range Status   Specimen Description EXPECTORATED  SPUTUM  Final   Special Requests NONE Reflexed from [id number]  Final   Gram Stain   Final    ABUNDANT WBC PRESENT, PREDOMINANTLY PMN RARE GRAM POSITIVE COCCI Performed at [facility], [address], [place], [place] [address]    Culture PENDING  Incomplete   Report Status PENDING  Incomplete         Radiology Studies: DG Chest 2 View Result Date: 02/03/2024 CLINICAL DATA:  Shortness of breath with body aches and cough for 2 days. EXAM: CHEST - 2 VIEW COMPARISON:  CT and radiographs 10/21/2023. FINDINGS: The heart is enlarged but stable. There is mildly increased vascular congestion with new patchy airspace disease in the right middle lobe. Possible residual small bilateral pleural effusions. There is no pneumothorax. The bones appear unchanged. IMPRESSION: 1. New patchy airspace disease in the right middle lobe suspicious for pneumonia. 2. Stable cardiomegaly with mildly increased vascular congestion and possible small bilateral pleural effusions. Electronically Signed   By: Elsie Perone M.D.   On: 02/03/2024 10:40        LOS: 1 day   Time spent= 42 mins    Deliliah Room, MD [facility]  If 7PM-7AM, please contact night-coverage  02/04/2024, 9:54 AM

## 2024-02-04 NOTE — Progress Notes (Signed)
 Heart Failure Navigator Progress Note  Assessed for Heart & Vascular TOC clinic readiness.  Patient does not meet criteria due to Advanced Heart Failure Team patient of Dr. Gala Romney. .   Navigator will sign off at this time.   Rhae Hammock, BSN, Scientist, clinical (histocompatibility and immunogenetics) Only

## 2024-02-05 ENCOUNTER — Other Ambulatory Visit (HOSPITAL_COMMUNITY): Payer: Self-pay

## 2024-02-05 LAB — LEGIONELLA PNEUMOPHILA SEROGP 1 UR AG: L. pneumophila Serogp 1 Ur Ag: NEGATIVE

## 2024-02-05 NOTE — Discharge Summary (Signed)
  Physician Discharge Summary   Patient: Donald Murray MRN: [id number] DOB: June 26, 1985  Admit date:     02/03/2024  Discharge date: 02/04/2024  Discharge Physician: Deliliah Room   Patient left against medical advice  38 y.o. male with medical history significant of  HTN, previous IVDA (heroin), tobacco use and systolic HF (onset 9/21) Severe OSA (untreated),and Class III Obesity presents to ED with complaint of sob , cough and body aches x 2 days.  He notes chills but no current fever.  He was being treated for possible CAP and acute CHF.He was being treated with IV lasix  with close monitoring of electrolytes as well as renal function. He was also on antibiotics for management of possible CAP.  He decided to leave the hospital against medical advice on 9/12. Risk explained including worsening of heart failure, pneumonia, sepsis and possible death.  Signed: Deliliah Room, MD [facility] 02/05/2024

## 2024-02-07 ENCOUNTER — Other Ambulatory Visit: Payer: Self-pay

## 2024-02-07 ENCOUNTER — Telehealth (HOSPITAL_COMMUNITY): Payer: Self-pay | Admitting: Cardiology

## 2024-02-07 ENCOUNTER — Telehealth: Payer: MEDICAID | Admitting: Family Medicine

## 2024-02-07 DIAGNOSIS — R7989 Other specified abnormal findings of blood chemistry: Secondary | ICD-10-CM

## 2024-02-07 DIAGNOSIS — R9389 Abnormal findings on diagnostic imaging of other specified body structures: Secondary | ICD-10-CM

## 2024-02-07 LAB — CULTURE, RESPIRATORY W GRAM STAIN: Culture: NORMAL

## 2024-02-07 NOTE — Progress Notes (Signed)
 [place]   Advised to return to ED. He needs to have fluid pulled off and treatment fro PNA that was found on xray and elevation of WBC.  Patient acknowledged agreement and understanding of the plan.

## 2024-02-07 NOTE — Telephone Encounter (Signed)
 Patient called to report he left AMA from ER 9/13. Reports ER provider thought he had pneumonia and never contacted AHF team   Would like to know if he should be seen sooner than appt he schedule 9/17  Reports he is still SOB, unable to lay flat, severe cough, body aches, mild swelling No home weight No CP Reports he has been compliant with medications x 2 weeks    Advised may need evaluation by PCP/return to ER if found above not 100% cardiac in nature   Will forward to provider

## 2024-02-08 ENCOUNTER — Telehealth (HOSPITAL_COMMUNITY): Payer: Self-pay

## 2024-02-08 LAB — CULTURE, BLOOD (ROUTINE X 2)
Culture: NO GROWTH
Culture: NO GROWTH
Special Requests: ADEQUATE
Special Requests: ADEQUATE

## 2024-02-08 NOTE — Telephone Encounter (Signed)
 Called to confirm/remind patient of their appointment at the [facility] on 02/09/24 8:30.   Appointment:   [] Confirmed  [x] Left mess   [] No answer/No voice mail  [] VM Full/unable to leave message  [] Phone not in service  Patient reminded to bring all medications and/or complete list.  Confirmed patient has transportation. Gave directions, instructed to utilize valet parking.

## 2024-02-08 NOTE — Telephone Encounter (Signed)
 Patient aware and voiced understanding

## 2024-02-08 NOTE — Progress Notes (Signed)
 ADVANCED HF CLINIC NOTE  Primary Care: Patient, [name] Primary Cardiologist: Redell Shallow, MD HF Cardiologist: Dr. Cherrie  HPI: Donald Murray is a morbidly obese 38 y.o. male with HTN, previous IVDA (heroin), tobacco use and systolic HF (onset 9/21).  Admitted to [facility] 9/21 with new onset HF in setting of severe HTN 174/128. ECG with sinus tach and frequent PVCs. Echo EF 20-25% Moderate RV dysfunction.   HF Consultation for the first time 12/21. 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 Zio placed to quantify PVC burden - 4.3%.  Admitted 12/22 with ADHF. Echo LVEF <20% w/ global HK, no visible thrombus, RV okay, mild MR.  LHC 1/23 with normal cors. EF < 10% Elevated filling pressures (PCWP 28) and low output (CI 1.5). Lasix  increased to 80 bid.  Sleep study 2/23: Severe OSA, moderate central sleep apnea. Waiting for CPAP machine, reports he was told there is a backorder on supplies.  Seen by Dr. Fernande in 7/23 and planning ICD. But hasn't been done due to scheduling issues.   Echo 11/24: EF 25%, RV moderately down  06/24/23. Seen in HF clinic. Volume overloaded.  Given 80 mg Furoscix . He wore on body infuser 4 hours then he accidentally pulled off. He reports lots of urine output.   Admitted 09/02/23 for incarcerated ventral hernia s/p repair. Course uncomplicated.   Admitted in 5/25 for ADHF. Echo EF 20-25% mod RV HK  Co-ox ok Diuresed 20 pounds then left AMA   Today he returns for HF follow up. Overall feeling fine. Breathing OK, has good days and bad days, good days > bad days. No SOB walking on flat ground. Denies palpitations, abnormal bleeding, CP, dizziness, edema, or PND/Orthopnea. Appetite ok. Weight at home 350 pounds. Taking all medications. Still in [place] house. Smoking 1/2-1ppd. No ETOH or drug use.    He returns today for heart failure follow up. Overall feeling ***. NYHA ***. Reports {Symptoms; cardiac:12860::dyspnea,fatigue}. Denies {Symptoms;  cardiac:12860::chest pain,dyspnea,fatigue,near-syncope,orthopnea,palpitations,dizziness,abnormal bleeding}. Able to perform ADLs. Appetite okay. Weight at home ***. BP at home***. Compliant with all medications.     Cardiac Studies: - Echo 5/25: EF 20-25%, mod RV HK - Echo 11/24: EF 25%, RV moderately down - Echo 2/24: EF 25-30%. RV moderately HK - Echo 09/24/21: EF 20-25% RV moderately HK - Echo 10/20: EF 60-65%   - CPX test 2/23 FVC 5.12  (92%)      FEV1 4.34  (96%)        FEV1/FVC 85   (103%)        MVV 164  (93%)   Resting HR: 126 Standing HR:125 Peak HR: 175   (95% age predicted max HR)  BP rest (standing): 92/70 BP peak: 148/72  Peak VO2: 21.6 (87% predicted peak VO2)  When adjusted to the patient's ideal body weight of 180.3 lb (81.8 kg) the peak VO2 is 41.6 ml/kg (ibw)/min (99% of the ibw-adjusted predicted).  VE/VCO2 slope:  45  VE/MVV:  92%  O2pulse:  19 (90% predicted O2pulse)   - RHC 1/23: RA 9, PA 66/38 (50), PCWP 28, CO/CI (fick) 3.9/1.5, PVR 5.7, PAPi 3.1  - Zio 12/21 Sinus - average HR 104 4.3% PVCs  Past Medical History:  Diagnosis Date   Abdominal hernia    CHF (congestive heart failure) (HCC)    Hypertension    Obesity    Snoring    Current Outpatient Medications  Medication Sig Dispense Refill   acetaminophen  (TYLENOL ) 500 MG tablet Take 1 tablet (500 mg total)  by mouth every 6 (six) hours as needed. 30 tablet 0   dapagliflozin  propanediol (FARXIGA ) 10 MG TABS tablet Take 1 tablet (10 mg total) by mouth daily before breakfast. 30 tablet 11   digoxin  (LANOXIN ) 0.125 MG tablet Take 1 tablet (0.125 mg total) by mouth daily. 90 tablet 3   ivabradine  (CORLANOR ) 7.5 MG TABS tablet Take 1 tablet (7.5 mg total) by mouth 2 (two) times daily with a meal. 60 tablet 11   metoprolol  succinate (TOPROL  XL) 25 MG 24 hr tablet Take 1 tablet (25 mg total) by mouth daily. 90 tablet 3   potassium chloride  SA (KLOR-CON  M) 20 MEQ tablet Take 2 tablets (40  mEq total) by mouth 2 (two) times daily. 60 tablet 11   sacubitril -valsartan  (ENTRESTO ) 24-26 MG Take 1 tablet by mouth 2 (two) times daily. 60 tablet 11   Semaglutide -Weight Management (WEGOVY ) 2.4 MG/0.75ML SOAJ Inject 2.4 mg into the skin once a week. 3 mL 5   spironolactone  (ALDACTONE ) 25 MG tablet Take 1 tablet (25 mg total) by mouth daily. 90 tablet 3   torsemide  (DEMADEX ) 20 MG tablet Take 4 tablets (80 mg total) by mouth daily. 120 tablet 0   No current facility-administered medications for this visit.   No Known Allergies  Social History   Socioeconomic History   Marital status: Single    Spouse name: Not on file   Number of children: Not on file   Years of education: Not on file   Highest education level: Not on file  Occupational History   Not on file  Tobacco Use   Smoking status: Every Day    Current packs/day: 1.00    Average packs/day: 1 pack/day for 16.0 years (16.0 ttl pk-yrs)    Types: Cigarettes   Smokeless tobacco: Never  Vaping Use   Vaping status: Some Days   Substances: Nicotine   Substance and Sexual Activity   Alcohol use: Not Currently    Comment: Pt stated 2 years clean   Drug use: Not Currently    Comment: Pt stated It was opiates   Sexual activity: Not on file  Other Topics Concern   Not on file  Social History Narrative   Not on file   Social Drivers of Health   Financial Resource Strain: High Risk (11/17/2023)   Overall Financial Resource Strain (CARDIA)    Difficulty of Paying Living Expenses: Hard  Food Insecurity: Food Insecurity Present (02/04/2024)   Hunger Vital Sign    Worried About Running Out of Food in the Last Year: Often true    Ran Out of Food in the Last Year: Often true  Transportation Needs: No Transportation Needs (02/04/2024)   PRAPARE - Administrator, Civil Service (Medical): No    Lack of Transportation (Non-Medical): No  Physical Activity: Not on file  Stress: Not on file  Social Connections: Not on  file  Intimate Partner Violence: Not At Risk (02/04/2024)   Humiliation, Afraid, Rape, and Kick questionnaire    Fear of Current or Ex-Partner: No    Emotionally Abused: No    Physically Abused: No    Sexually Abused: No   Family History  Problem Relation Age of Onset   Hypertension Mother    Hypertension Father    There were no vitals taken for this visit.  Wt Readings from Last 3 Encounters:  02/04/24 (!) 167 kg (368 lb 2.7 oz)  12/15/23 (!) 161.5 kg (356 lb)  11/17/23 (!) 163.2 kg (359 lb  12.8 oz)    PHYSICAL EXAM: General: Well appearing. No distress on RA Cardiac: JVP ***. S1 and S2 present. No murmurs or rub. Resp: Lung sounds clear and equal B/L Abdomen: Soft, non-tender, non-distended.  Extremities: Warm and dry.  *** edema.  Neuro: Alert and oriented x3. Affect pleasant. Moves all extremities without difficulty.  ECG (personally reviewed): ST 116    ASSESSMENT & PLAN:  1. Chronic Systolic HF - Echo 9/21 EF 20-25% RV moderately HK. Suspect HTN vs PVC-mediated (PVC burden 4.3% - probably not high enough to cause CM) - LHC 1/23 no CAD EF < 10% - CPX 2/23 pVO2: 21.6 (87% predicted peak VO2) corrected for ibw pVO2 41.6 ml/kg (ibw)/min (99% of the ibw-adjusted predicted). Slope:45 O2pulse:  19 (90% pred) - Echo 5/23, 2/24, and 11/24: EF 20-25% RV moderately HK - CPX test reassuring VO2 but slope high  - Remains tenuous NYHA III-IIIb, volume status OK today. - Continue torsemide  80 mg daily + 40 KCL daily.  - Continue Toprol  XL 25 mg daily. - Continue ivabradine  7.5 mg bid - Continue Entresto  24/26 mg bid - Continue digoxin  0.125 mg daily. Dig level < 0.4 on 6/25 - Continue spiro 25 mg daily. - Continue Farxiga  10 mg daily - has met with EP but decided to defer ICD - not candidate for advanced therapies currently given social situation - Labs today.  2. Sinus Tach  - Rate in 108-110 range today, jsut took his meds. - Continue Toprol  XL  25 mg daily. - Continue  Ivabradine  7.5 mg bid.  - Continue digoxin  0.125 mg daily.   3.  H/o Frequent PVCs - Zio 12/21 4.3% PVCs - probably not high enough burden to cause CM  - Has severe OSA.   4.  Morbid obesity - There is no height or weight on file to calculate BMI. - Remains on GLP1RA without much success - ? Switch to tirzepatide or retatrutide (when available) would results in more meaningful weight loss  5. OSA, Severe - Not on CPAP yet.  - Cannot afford. Engaged HFSW for resources.  6.  Tobacco use - Still smoking 1/2 ppd  - Discussed cessation - Says nicotine  patches don't work  7. H/O IVDA (heroin) - Last used 2020  - No change  8. SDOH needs - HFSW helping with housing issues  Follow up in 3 months with APP.  [place] Robley [place], NP 4:27 PM

## 2024-02-09 ENCOUNTER — Ambulatory Visit (HOSPITAL_COMMUNITY): Payer: Self-pay | Admitting: Cardiology

## 2024-02-09 ENCOUNTER — Ambulatory Visit (HOSPITAL_COMMUNITY)
Admission: RE | Admit: 2024-02-09 | Discharge: 2024-02-09 | Disposition: A | Discharge status: Home / Self Care | Payer: MEDICAID | Source: Ambulatory Visit | Attending: Cardiology | Admitting: Cardiology

## 2024-02-09 ENCOUNTER — Encounter (HOSPITAL_COMMUNITY): Payer: Self-pay

## 2024-02-09 ENCOUNTER — Other Ambulatory Visit (HOSPITAL_COMMUNITY): Payer: Self-pay

## 2024-02-09 VITALS — BP 124/82 | HR 97 | Temp 98.8°F | Ht 71.0 in | Wt 363.0 lb

## 2024-02-09 DIAGNOSIS — I493 Ventricular premature depolarization: Secondary | ICD-10-CM | POA: Insufficient documentation

## 2024-02-09 DIAGNOSIS — Z79899 Other long term (current) drug therapy: Secondary | ICD-10-CM | POA: Insufficient documentation

## 2024-02-09 DIAGNOSIS — F17210 Nicotine dependence, cigarettes, uncomplicated: Secondary | ICD-10-CM | POA: Insufficient documentation

## 2024-02-09 DIAGNOSIS — Z7984 Long term (current) use of oral hypoglycemic drugs: Secondary | ICD-10-CM | POA: Diagnosis not present

## 2024-02-09 DIAGNOSIS — G4733 Obstructive sleep apnea (adult) (pediatric): Secondary | ICD-10-CM | POA: Insufficient documentation

## 2024-02-09 DIAGNOSIS — Z720 Tobacco use: Secondary | ICD-10-CM

## 2024-02-09 DIAGNOSIS — R000 Tachycardia, unspecified: Secondary | ICD-10-CM | POA: Insufficient documentation

## 2024-02-09 DIAGNOSIS — I110 Hypertensive heart disease with heart failure: Secondary | ICD-10-CM | POA: Diagnosis present

## 2024-02-09 DIAGNOSIS — F1111 Opioid abuse, in remission: Secondary | ICD-10-CM | POA: Diagnosis not present

## 2024-02-09 DIAGNOSIS — R918 Other nonspecific abnormal finding of lung field: Secondary | ICD-10-CM | POA: Diagnosis not present

## 2024-02-09 DIAGNOSIS — J189 Pneumonia, unspecified organism: Secondary | ICD-10-CM

## 2024-02-09 DIAGNOSIS — I5042 Chronic combined systolic (congestive) and diastolic (congestive) heart failure: Secondary | ICD-10-CM | POA: Insufficient documentation

## 2024-02-09 DIAGNOSIS — I5022 Chronic systolic (congestive) heart failure: Secondary | ICD-10-CM | POA: Diagnosis not present

## 2024-02-09 DIAGNOSIS — I5023 Acute on chronic systolic (congestive) heart failure: Secondary | ICD-10-CM

## 2024-02-09 DIAGNOSIS — Z6841 Body Mass Index (BMI) 40.0 and over, adult: Secondary | ICD-10-CM | POA: Insufficient documentation

## 2024-02-09 LAB — CBC
HCT: 47 % (ref 39.0–52.0)
Hemoglobin: 14.6 g/dL (ref 13.0–17.0)
MCH: 24.6 pg — ABNORMAL LOW (ref 26.0–34.0)
MCHC: 31.1 g/dL (ref 30.0–36.0)
MCV: 79.1 fL — ABNORMAL LOW (ref 80.0–100.0)
Platelets: 401 K/uL — ABNORMAL HIGH (ref 150–400)
RBC: 5.94 MIL/uL — ABNORMAL HIGH (ref 4.22–5.81)
RDW: 18.5 % — ABNORMAL HIGH (ref 11.5–15.5)
WBC: 9.2 K/uL (ref 4.0–10.5)
nRBC: 0 % (ref 0.0–0.2)

## 2024-02-09 LAB — BASIC METABOLIC PANEL WITH GFR
Anion gap: 13 (ref 5–15)
BUN: 22 mg/dL — ABNORMAL HIGH (ref 6–20)
CO2: 29 mmol/L (ref 22–32)
Calcium: 9.2 mg/dL (ref 8.9–10.3)
Chloride: 96 mmol/L — ABNORMAL LOW (ref 98–111)
Creatinine, Ser: 1.09 mg/dL (ref 0.61–1.24)
GFR, Estimated: >60 mL/min (ref >60)
Glucose, Bld: 88 mg/dL (ref 70–99)
Potassium: 4.8 mmol/L (ref 3.5–5.1)
Sodium: 138 mmol/L (ref 135–145)

## 2024-02-09 LAB — BRAIN NATRIURETIC PEPTIDE: B Natriuretic Peptide: 185.6 pg/mL — ABNORMAL HIGH (ref 0.0–100.0)

## 2024-02-09 MED ORDER — AZITHROMYCIN 250 MG PO TABS
ORAL_TABLET | ORAL | 0 refills | Status: AC
  Filled 2024-02-09: qty 6, 5d supply, fill #0

## 2024-02-09 MED ORDER — POTASSIUM CHLORIDE CRYS ER 20 MEQ PO TBCR
40.0000 meq | EXTENDED_RELEASE_TABLET | Freq: Every day | ORAL | 11 refills | Status: AC
  Filled 2024-02-09: qty 80, 40d supply, fill #0

## 2024-02-09 MED ORDER — BENZONATATE 100 MG PO CAPS
200.0000 mg | ORAL_CAPSULE | Freq: Two times a day (BID) | ORAL | 0 refills | Status: DC | PRN
  Filled 2024-02-09: qty 20, 5d supply, fill #0

## 2024-02-09 MED ORDER — TORSEMIDE 20 MG PO TABS
100.0000 mg | ORAL_TABLET | Freq: Every day | ORAL | 11 refills | Status: AC
  Filled 2024-02-09: qty 160, 32d supply, fill #0
  Filled 2024-06-01: qty 160, 32d supply, fill #1

## 2024-02-09 MED ORDER — AMOXICILLIN-POT CLAVULANATE 875-125 MG PO TABS
1.0000 | ORAL_TABLET | Freq: Two times a day (BID) | ORAL | 0 refills | Status: DC
  Filled 2024-02-09: qty 10, 5d supply, fill #0

## 2024-02-09 NOTE — Patient Instructions (Addendum)
 Take Augmentin  875 mg twice daily for 5 days - Rx sent. 2.   After completing Augmentin , take Z pack as directed - Rx sent.  3.   May take Tessalon  Perle one tablet twice daily as needed for cough. 4.   Take Torsemide  80 mg twice daily for three more days along with extra 40 meq potassium on those days. Then take Torsemide  100 mg daily along with 40 meq of        Potassium daily - updated Rx sent.  5.   Labs today - will call you if abnormal. 6.   Return to Heart Failure APP Clinic in one week - see below. 7.   Please call us  if any questions or concerns prior to your next visit.

## 2024-02-10 ENCOUNTER — Telehealth (HOSPITAL_COMMUNITY): Payer: Self-pay | Admitting: *Deleted

## 2024-02-10 NOTE — Telephone Encounter (Signed)
 Pt called to report he tested positive for Covid last night, pt states he did pick up meds as ordered by [place] Lee, NP  Will monitor symptoms, ER precautions given

## 2024-02-16 NOTE — Progress Notes (Signed)
 ADVANCED HF CLINIC NOTE  Primary Care: Patient, [name] Primary Cardiologist: Redell Shallow, MD HF Cardiologist: Dr. Cherrie  HPI: Donald Murray is a 38 y.o. male with HTN, previous IVDA (heroin), tobacco use, obesity and systolic HF (onset 9/21).  Admitted to [facility] 9/21 with new onset HF in setting of severe HTN 174/128. ECG with sinus tach and frequent PVCs. Echo EF 20-25% Moderate RV dysfunction.   HF Consultation for the first time 12/21. 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 Zio placed to quantify PVC burden - 4.3%.  Admitted 12/22 with ADHF. Echo LVEF <20% w/ global HK, no visible thrombus, RV okay, mild MR.  LHC 1/23 with normal cors. EF < 10% Elevated filling pressures (PCWP 28) and low output (CI 1.5). Lasix  increased to 80 bid.  Sleep study 2/23: Severe OSA, moderate central sleep apnea. Waiting for CPAP machine, reports he was told there is a backorder on supplies.  Seen by Dr. Fernande in 7/23 and planning ICD. But hasn't been done due to scheduling issues.   Echo 11/24: EF 25%, RV moderately down  06/24/23. Seen in HF clinic. Volume overloaded.  Given 80 mg Furoscix . He wore on body infuser 4 hours then he accidentally pulled off. He reports lots of urine output.   Admitted 09/02/23 for incarcerated ventral hernia s/p repair. Course uncomplicated.   Admitted in 5/25 for ADHF. Echo EF 20-25% mod RV HK  Co-ox ok Diuresed 20 pounds then left AMA.   Admitted to observation 9/25 with CAP and volume overload for IV antibiotics and IV diuresis. He was given azithro and lasix , however had to leave to go to work [employer]. He was unable to get PO antibiotics  Today he returns for HF follow up. Overall feeling better. Tested + for COVID lat week, breathing better, no SOB with activity. Not working right now, has stable housing but getting behind on bills. Denies palpitations, abnormal bleeding, CP, dizziness, edema, or PND/Orthopnea. Appetite ok. Not weighing at home. Taking all  medications. Smoking 0.5-1 ppd. Remains sober.   Cardiac Studies: - Echo 5/25: EF 20-25%, mod RV HK - Echo 11/24: EF 25%, RV moderately down - Echo 2/24: EF 25-30%. RV moderately HK - Echo 09/24/21: EF 20-25% RV moderately HK - Echo 10/20: EF 60-65%  - RHC 1/23: RA 9, PA 66/38 (50), PCWP 28, CO/CI (fick) 3.9/1.5, PVR 5.7, PAPi 3.1 - Zio 12/21: Sinus - average HR 104; 4.3% PVCs - CPX 2/23: FVC 5.12  (92%), FEV1 4.34  (96%), FEV1/FVC 85 (103%), MVV 164  (93%)  Resting HR: 126 Standing HR:125 Peak HR: 175   (95% age predicted max HR)  BP rest (standing): 92/70 BP peak: 148/72  Peak VO2: 21.6 (87% predicted peak VO2)  When adjusted to the patient's ideal body weight of 180.3 lb (81.8 kg) the peak VO2 is 41.6 ml/kg (ibw)/min (99% of the ibw-adjusted predicted).  VE/VCO2 slope:  45  VE/MVV:  92%  O2pulse:  19 (90% predicted O2pulse)   Past Medical History:  Diagnosis Date   Abdominal hernia    CHF (congestive heart failure) (HCC)    Hypertension    Obesity    Snoring    Current Outpatient Medications  Medication Sig Dispense Refill   azithromycin  (ZITHROMAX ) 250 MG tablet Take 250 mg by mouth daily.     dapagliflozin  propanediol (FARXIGA ) 10 MG TABS tablet Take 1 tablet (10 mg total) by mouth daily before breakfast. 30 tablet 11   digoxin  (LANOXIN ) 0.125 MG tablet Take 1  tablet (0.125 mg total) by mouth daily. 90 tablet 3   ivabradine  (CORLANOR ) 7.5 MG TABS tablet Take 1 tablet (7.5 mg total) by mouth 2 (two) times daily with a meal. 60 tablet 11   metoprolol  succinate (TOPROL  XL) 25 MG 24 hr tablet Take 1 tablet (25 mg total) by mouth daily. 90 tablet 3   potassium chloride  SA (KLOR-CON  M) 20 MEQ tablet Take 2 tablets  (40 meq) daily or as directed by Heart Failure Clinic 80 tablet 11   sacubitril -valsartan  (ENTRESTO ) 24-26 MG Take 1 tablet by mouth 2 (two) times daily. 60 tablet 11   Semaglutide -Weight Management (WEGOVY ) 2.4 MG/0.75ML SOAJ Inject 2.4 mg into the skin once a week. 3 mL  5   spironolactone  (ALDACTONE ) 25 MG tablet Take 1 tablet (25 mg total) by mouth daily. 90 tablet 3   torsemide  (DEMADEX ) 20 MG tablet Take 5 tablets (100 mg) daily or as directed by Heart Failure Clinic 160 tablet 11   acetaminophen  (TYLENOL ) 500 MG tablet Take 1 tablet (500 mg total) by mouth every 6 (six) hours as needed. 30 tablet 0   amoxicillin -clavulanate (AUGMENTIN ) 875-125 MG tablet Take 1 tablet by mouth 2 (two) times daily. 10 tablet 0   benzonatate  (TESSALON  PERLES) 100 MG capsule Take 2 capsules (200 mg total) by mouth 2 (two) times daily as needed for cough. 20 capsule 0   No current facility-administered medications for this encounter.   No Known Allergies  Social History   Socioeconomic History   Marital status: Single    Spouse name: Not on file   Number of children: Not on file   Years of education: Not on file   Highest education level: Not on file  Occupational History   Not on file  Tobacco Use   Smoking status: Every Day    Current packs/day: 1.00    Average packs/day: 1 pack/day for 16.0 years (16.0 ttl pk-yrs)    Types: Cigarettes   Smokeless tobacco: Never  Vaping Use   Vaping status: Some Days   Substances: Nicotine   Substance and Sexual Activity   Alcohol use: Not Currently    Comment: Pt stated 2 years clean   Drug use: Not Currently    Comment: Pt stated It was opiates   Sexual activity: Not on file  Other Topics Concern   Not on file  Social History Narrative   Not on file   Social Drivers of Health   Financial Resource Strain: High Risk (11/17/2023)   Overall Financial Resource Strain (CARDIA)    Difficulty of Paying Living Expenses: Hard  Food Insecurity: Food Insecurity Present (02/04/2024)   Hunger Vital Sign    Worried About Running Out of Food in the Last Year: Often true    Ran Out of Food in the Last Year: Often true  Transportation Needs: No Transportation Needs (02/04/2024)   PRAPARE - Scientist, research (physical sciences) (Medical): No    Lack of Transportation (Non-Medical): No  Physical Activity: Not on file  Stress: Not on file  Social Connections: Not on file  Intimate Partner Violence: Not At Risk (02/04/2024)   Humiliation, Afraid, Rape, and Kick questionnaire    Fear of Current or Ex-Partner: No    Emotionally Abused: No    Physically Abused: No    Sexually Abused: No   Family History  Problem Relation Age of Onset   Hypertension Mother    Hypertension Father    BP 122/84   Pulse  89   Wt (!) 159.8 kg (352 lb 3.2 oz)   SpO2 96%   BMI 49.12 kg/m   Wt Readings from Last 3 Encounters:  02/17/24 (!) 159.8 kg (352 lb 3.2 oz)  02/09/24 (!) 164.7 kg (363 lb)  02/04/24 (!) 167 kg (368 lb 2.7 oz)    PHYSICAL EXAM: General:  NAD. No resp difficulty, walked into clinic HEENT: Normal Neck: Supple. No JVD. Thick neck Cor: Regular rate & rhythm. No rubs, gallops or murmurs. Lungs: Clear, diminished in bases Abdomen: Soft, obese,  nontender, nondistended.  Extremities: No cyanosis, clubbing, rash, edema Neuro: Alert & oriented x 3, moves all 4 extremities w/o difficulty. Affect pleasant.    ASSESSMENT & PLAN: 1. Chronic Systolic and Diastolic HF - Echo 9/21 EF 20-25% RV moderately HK. Suspect HTN vs PVC-mediated (PVC burden 4.3% - probably not high enough to cause CM) - LHC 1/23 no CAD EF < 10% - CPX 2/23 pVO2: 21.6 (87% predicted peak VO2) corrected for ibw pVO2 41.6 ml/kg (ibw)/min (99% of the ibw-adjusted predicted). Slope:45 O2pulse:  19 (90% pred) - Echo 5/23, 2/24, and 11/24: EF 20-25% RV moderately HK - Most recent echo 9/25 unchanged 20-25%, G3DD, mod reduced RV, RVSP 50.5 - CPX test reassuring VO2 but slope high  - Improved NYHA II, volume looks OK today, weight down 9 lbs - Continue torsemide  100 mg daily, OK to take extra 40 mg PRN - Continue Toprol  XL 25 mg daily. - Continue ivabradine  7.5 mg bid - Continue Entresto  24/26 mg bid - Continue digoxin  0.125 mg daily.   - Continue spiro 25 mg daily. - Continue Farxiga  10 mg daily - has met with EP but decided to defer ICD - not candidate for advanced therapies currently given social situation - Labs today  2. Sinus Tach  - HR <100 today  - Continue Toprol  XL  25 mg daily. - Continue Ivabradine  7.5 mg bid.  - Continue digoxin  0.125 mg daily.   3.  H/o Frequent PVCs - Zio 12/21 4.3% PVCs - probably not high enough burden to cause CM  - Has severe OSA.   4.  Morbid obesity - Body mass index is 49.12 kg/m. - Remains on GLP1RA without much success - ? Switch to tirzepatide or retatrutide (when available) would result in more meaningful weight loss  5. OSA, Severe - Not on CPAP yet.  - Cannot afford. Engaged HFSW for resources.  6.  Tobacco use - Still smoking 1/2 ppd  - Discussed cessation - Says nicotine  patches don't work  7. H/O IVDA (heroin) - Last used 2020. Remains sober.  - No change  8. SDOH  - HFSW helping with housing issues - Needs PCP  Follow up in 1 month with APP and 3 months with Dr. Bensimhon  Thy Gullikson M Tisa Weisel, FNP 3:46 PM

## 2024-02-17 ENCOUNTER — Encounter (HOSPITAL_COMMUNITY): Payer: Self-pay

## 2024-02-17 ENCOUNTER — Ambulatory Visit (HOSPITAL_COMMUNITY)
Admission: RE | Admit: 2024-02-17 | Discharge: 2024-02-17 | Disposition: A | Discharge status: Home / Self Care | Payer: MEDICAID | Source: Ambulatory Visit | Attending: Family Medicine | Admitting: Family Medicine

## 2024-02-17 VITALS — BP 122/84 | HR 89 | Wt 352.2 lb

## 2024-02-17 DIAGNOSIS — F17210 Nicotine dependence, cigarettes, uncomplicated: Secondary | ICD-10-CM | POA: Insufficient documentation

## 2024-02-17 DIAGNOSIS — Z5986 Financial insecurity: Secondary | ICD-10-CM | POA: Insufficient documentation

## 2024-02-17 DIAGNOSIS — Z6841 Body Mass Index (BMI) 40.0 and over, adult: Secondary | ICD-10-CM | POA: Insufficient documentation

## 2024-02-17 DIAGNOSIS — I110 Hypertensive heart disease with heart failure: Secondary | ICD-10-CM | POA: Diagnosis not present

## 2024-02-17 DIAGNOSIS — Z139 Encounter for screening, unspecified: Secondary | ICD-10-CM

## 2024-02-17 DIAGNOSIS — Z79899 Other long term (current) drug therapy: Secondary | ICD-10-CM | POA: Diagnosis not present

## 2024-02-17 DIAGNOSIS — G4733 Obstructive sleep apnea (adult) (pediatric): Secondary | ICD-10-CM | POA: Diagnosis not present

## 2024-02-17 DIAGNOSIS — R000 Tachycardia, unspecified: Secondary | ICD-10-CM | POA: Diagnosis not present

## 2024-02-17 DIAGNOSIS — Z8249 Family history of ischemic heart disease and other diseases of the circulatory system: Secondary | ICD-10-CM | POA: Insufficient documentation

## 2024-02-17 DIAGNOSIS — Z720 Tobacco use: Secondary | ICD-10-CM

## 2024-02-17 DIAGNOSIS — I493 Ventricular premature depolarization: Secondary | ICD-10-CM | POA: Insufficient documentation

## 2024-02-17 DIAGNOSIS — I5022 Chronic systolic (congestive) heart failure: Secondary | ICD-10-CM

## 2024-02-17 DIAGNOSIS — I5042 Chronic combined systolic (congestive) and diastolic (congestive) heart failure: Secondary | ICD-10-CM | POA: Insufficient documentation

## 2024-02-17 LAB — BASIC METABOLIC PANEL WITH GFR
Anion gap: 13 (ref 5–15)
BUN: 12 mg/dL (ref 6–20)
CO2: 24 mmol/L (ref 22–32)
Calcium: 9 mg/dL (ref 8.9–10.3)
Chloride: 100 mmol/L (ref 98–111)
Creatinine, Ser: 0.9 mg/dL (ref 0.61–1.24)
GFR, Estimated: >60 mL/min (ref >60)
Glucose, Bld: 73 mg/dL (ref 70–99)
Potassium: 3.5 mmol/L (ref 3.5–5.1)
Sodium: 137 mmol/L (ref 135–145)

## 2024-02-17 LAB — DIGOXIN LEVEL: Digoxin Level: <0.6 ng/mL — ABNORMAL LOW (ref 0.8–2.0)

## 2024-02-17 LAB — BRAIN NATRIURETIC PEPTIDE: B Natriuretic Peptide: 375.8 pg/mL — ABNORMAL HIGH (ref 0.0–100.0)

## 2024-02-17 NOTE — Patient Instructions (Signed)
 YOU CAN TAKE AN EXTRA 40 MG OF TORSEMIDE  IF NEEDED FOR EDEMA.  Labs done today, your results will be available in MyChart, we will contact you for abnormal readings.  Your physician recommends that you schedule a follow-up appointment in: 3 months.  If you have any questions or concerns before your next appointment please send us  a message through [place] or call our office at [phone].    TO LEAVE A MESSAGE FOR THE NURSE SELECT OPTION 2, PLEASE LEAVE A MESSAGE INCLUDING: YOUR NAME DATE OF BIRTH CALL BACK NUMBER REASON FOR CALL**this is important as we prioritize the call backs  YOU WILL RECEIVE A CALL BACK THE SAME DAY AS LONG AS YOU CALL BEFORE 4:00 PM At the [facility], you and your health needs are our priority. As part of our continuing mission to provide you with exceptional heart care, we have created designated Provider Care Teams. These Care Teams include your primary Cardiologist (physician) and Advanced Practice Providers (APPs- Physician Assistants and Nurse Practitioners) who all work together to provide you with the care you need, when you need it.   You may see any of the following providers on your designated Care Team at your next follow up: Dr Toribio Fuel Dr Ezra Shuck Dr. Ria Commander Dr. Morene Brownie Amy Lenetta, NP Caffie Shed, [place] [facility] [place], [place] Beckey Coe, NP [place] Lee, NP Ellouise Class, NP Tinnie Redman, PharmD Jaun Bash, PharmD   Please be sure to bring in all your medications bottles to every appointment.    Thank you for choosing [facility]

## 2024-02-17 NOTE — Addendum Note (Signed)
 Encounter addended by: Cathern Andriette DEL, LCSW on: 02/17/2024 4:17 PM  Actions taken: Flowsheet data copied forward, Flowsheet accepted, Clinical Note Signed

## 2024-02-17 NOTE — Progress Notes (Addendum)
 H&V Care Navigation CSW Progress Note  Clinical Social Worker consulted to speak with pt regarding financial concerns.  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  Pt now 3 weeks behind ($160/week) at [employer].  No official notice of being asked to leave but he says these things work quickly as residents vote on it.  CSW will work on assisting pt with past due rent- only has $440 left on patient care fund assistance will ask for exception to avoid homelessness.  All documents to assist through patient care fund up to date other than will need updated invoice from manager- CSW will reach out to obtain.  Pt interviewing for jobs so hopeful to have source of income soon.  Pt also struggling with getting enough food.  Gets $23/month from food stamps- working on getting this increased with loss of income.  CSW provided with list of food pantries and Heart and Vascular Food Bag.  Will follow up with pt regarding financial assistance.  SDOH Screenings   Food Insecurity: Food Insecurity Present (02/04/2024)  Housing: High Risk (02/04/2024)  Transportation Needs: No Transportation Needs (02/04/2024)  Utilities: Not At Risk (02/04/2024)  Financial Resource Strain: High Risk (11/17/2023)  Tobacco Use: High Risk (02/17/2024)  Health Literacy: Adequate Health Literacy (11/17/2023)   Andriette HILARIO Leech, LCSW Clinical Social Worker [facility] Desk#: [phone] Cell#: [phone]

## 2024-02-17 NOTE — Addendum Note (Signed)
 Encounter addended by: Marcelina Lisa HERO, RN on: 02/17/2024 4:03 PM  Actions taken: Order list changed, Diagnosis association updated, Clinical Note Signed, Charge Capture section accepted

## 2024-02-18 ENCOUNTER — Telehealth (HOSPITAL_COMMUNITY): Payer: Self-pay | Admitting: Licensed Clinical Social Worker

## 2024-02-18 ENCOUNTER — Ambulatory Visit (HOSPITAL_COMMUNITY): Payer: Self-pay | Admitting: Family Medicine

## 2024-02-18 NOTE — Telephone Encounter (Signed)
 H&V Care Navigation CSW Progress Note  Clinical Social Worker spoke with Production designer, [employer]tele[employer] of [employer] regarding assistance- he will email me the invoice and confirm we have the correct w-9 on file for [employer].  Patient updated   Andriette HILARIO Leech, LCSW Clinical Social Worker [facility] Desk#: [phone] Cell#: [phone]

## 2024-02-23 ENCOUNTER — Telehealth (HOSPITAL_COMMUNITY): Payer: Self-pay | Admitting: Licensed Clinical Social Worker

## 2024-02-23 NOTE — Telephone Encounter (Signed)
 H&V Care Navigation CSW Progress Note  Clinical Social Worker received all required paperwork for check request to assist with pt rent- request submitted and awaiting payment.  SDOH Screenings   Food Insecurity: Food Insecurity Present (02/17/2024)  Housing: High Risk (02/17/2024)  Transportation Needs: No Transportation Needs (02/04/2024)  Utilities: Not At Risk (02/04/2024)  Financial Resource Strain: High Risk (11/17/2023)  Tobacco Use: High Risk (02/17/2024)  Health Literacy: Adequate Health Literacy (11/17/2023)    Andriette HILARIO Leech, LCSW Clinical Social Worker [facility] Desk#: [phone] Cell#: [phone]

## 2024-03-01 ENCOUNTER — Telehealth (HOSPITAL_COMMUNITY): Payer: Self-pay | Admitting: Licensed Clinical Social Worker

## 2024-03-01 NOTE — Telephone Encounter (Signed)
 H&V Care Navigation CSW Progress Note  Clinical Social Worker received check for rental assistance- sent message to landlord to inform as he plans to pick up  CSW informed pt of above  Pt requested help getting PCP appt- sent message to [facility] to request assistance with obtaining.   SDOH Screenings   Food Insecurity: Food Insecurity Present (02/17/2024)  Housing: High Risk (02/17/2024)  Transportation Needs: No Transportation Needs (02/04/2024)  Utilities: Not At Risk (02/04/2024)  Financial Resource Strain: High Risk (11/17/2023)  Tobacco Use: High Risk (02/17/2024)  Health Literacy: Adequate Health Literacy (11/17/2023)   Donald HILARIO Leech, LCSW Clinical Social Worker [facility] Desk#: [phone] Cell#: [phone]

## 2024-03-15 ENCOUNTER — Telehealth (HOSPITAL_COMMUNITY): Payer: Self-pay

## 2024-03-15 NOTE — Telephone Encounter (Signed)
 Called to confirm/remind patient of their appointment at the [facility] on 03/16/24. However, patient canceled his appoint.  Another appointment was all ready scheduled

## 2024-03-15 NOTE — Progress Notes (Incomplete)
 ADVANCED HF CLINIC NOTE  Primary Care: Patient, [name] Primary Cardiologist: Redell Shallow, MD HF Cardiologist: Dr. Cherrie  HPI: Donald Murray is a 38 y.o. male with HTN, previous IVDA (heroin), tobacco use, obesity and systolic HF (onset 9/21).  Admitted to [facility] 9/21 with new onset HF in setting of severe HTN 174/128. ECG with sinus tach and frequent PVCs. Echo EF 20-25% Moderate RV dysfunction.   HF Consultation for the first time 12/21. 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 Zio placed to quantify PVC burden - 4.3%.  Admitted 12/22 with ADHF. Echo LVEF <20% w/ global HK, no visible thrombus, RV okay, mild MR.  LHC 1/23 with normal cors. EF < 10% Elevated filling pressures (PCWP 28) and low output (CI 1.5). Lasix  increased to 80 bid.  Sleep study 2/23: Severe OSA, moderate central sleep apnea. Waiting for CPAP machine, reports he was told there is a backorder on supplies.  Seen by Dr. Fernande in 7/23 and planning ICD. But hasn't been done due to scheduling issues.   Echo 11/24: EF 25%, RV moderately down  06/24/23. Seen in HF clinic. Volume overloaded.  Given 80 mg Furoscix . He wore on body infuser 4 hours then he accidentally pulled off. He reports lots of urine output.   Admitted 09/02/23 for incarcerated ventral hernia s/p repair. Course uncomplicated.   Admitted in 5/25 for ADHF. Echo EF 20-25% mod RV HK  Co-ox ok Diuresed 20 pounds then left AMA.   Admitted to observation 9/25 with CAP and volume overload for IV antibiotics and IV diuresis. He was given azithro and lasix , however had to leave to go to work [employer]. He was unable to get PO antibiotics  Today he returns for AHF follow up. Overall feeling ***. Denies palpitations, CP, dizziness, edema, or PND/Orthopnea. *** SOB. Appetite ok. No fever or chills. Weight at home *** pounds. Taking all medications. Denies ETOH, tobacco or drug use.  Smoking 0.5-1 ppd. Remains sober.   Cardiac Studies: - Echo 5/25: EF 20-25%, mod  RV HK - Echo 11/24: EF 25%, RV moderately down - Echo 2/24: EF 25-30%. RV moderately HK - Echo 09/24/21: EF 20-25% RV moderately HK - Echo 10/20: EF 60-65%  - RHC 1/23: RA 9, PA 66/38 (50), PCWP 28, CO/CI (fick) 3.9/1.5, PVR 5.7, PAPi 3.1 - Zio 12/21: Sinus - average HR 104; 4.3% PVCs - CPX 2/23: FVC 5.12  (92%), FEV1 4.34  (96%), FEV1/FVC 85 (103%), MVV 164  (93%)  Resting HR: 126 Standing HR:125 Peak HR: 175   (95% age predicted max HR)  BP rest (standing): 92/70 BP peak: 148/72  Peak VO2: 21.6 (87% predicted peak VO2)  When adjusted to the patient's ideal body weight of 180.3 lb (81.8 kg) the peak VO2 is 41.6 ml/kg (ibw)/min (99% of the ibw-adjusted predicted).  VE/VCO2 slope:  45  VE/MVV:  92%  O2pulse:  19 (90% predicted O2pulse)   Past Medical History:  Diagnosis Date   Abdominal hernia    CHF (congestive heart failure) (HCC)    Hypertension    Obesity    Snoring    Current Outpatient Medications  Medication Sig Dispense Refill   acetaminophen  (TYLENOL ) 500 MG tablet Take 1 tablet (500 mg total) by mouth every 6 (six) hours as needed. 30 tablet 0   amoxicillin -clavulanate (AUGMENTIN ) 875-125 MG tablet Take 1 tablet by mouth 2 (two) times daily. 10 tablet 0   azithromycin  (ZITHROMAX ) 250 MG tablet Take 250 mg by mouth daily.  benzonatate  (TESSALON  PERLES) 100 MG capsule Take 2 capsules (200 mg total) by mouth 2 (two) times daily as needed for cough. 20 capsule 0   dapagliflozin  propanediol (FARXIGA ) 10 MG TABS tablet Take 1 tablet (10 mg total) by mouth daily before breakfast. 30 tablet 11   digoxin  (LANOXIN ) 0.125 MG tablet Take 1 tablet (0.125 mg total) by mouth daily. 90 tablet 3   ivabradine  (CORLANOR ) 7.5 MG TABS tablet Take 1 tablet (7.5 mg total) by mouth 2 (two) times daily with a meal. 60 tablet 11   metoprolol  succinate (TOPROL  XL) 25 MG 24 hr tablet Take 1 tablet (25 mg total) by mouth daily. 90 tablet 3   potassium chloride  SA (KLOR-CON  M) 20 MEQ tablet Take 2  tablets  (40 meq) daily or as directed by Heart Failure Clinic 80 tablet 11   sacubitril -valsartan  (ENTRESTO ) 24-26 MG Take 1 tablet by mouth 2 (two) times daily. 60 tablet 11   Semaglutide -Weight Management (WEGOVY ) 2.4 MG/0.75ML SOAJ Inject 2.4 mg into the skin once a week. 3 mL 5   spironolactone  (ALDACTONE ) 25 MG tablet Take 1 tablet (25 mg total) by mouth daily. 90 tablet 3   torsemide  (DEMADEX ) 20 MG tablet Take 5 tablets (100 mg) daily or as directed by Heart Failure Clinic 160 tablet 11   No current facility-administered medications for this visit.   No Known Allergies  Social History   Socioeconomic History   Marital status: Single    Spouse name: Not on file   Number of children: Not on file   Years of education: Not on file   Highest education level: Not on file  Occupational History   Not on file  Tobacco Use   Smoking status: Every Day    Current packs/day: 1.00    Average packs/day: 1 pack/day for 16.0 years (16.0 ttl pk-yrs)    Types: Cigarettes   Smokeless tobacco: Never  Vaping Use   Vaping status: Some Days   Substances: Nicotine   Substance and Sexual Activity   Alcohol use: Not Currently    Comment: Pt stated 2 years clean   Drug use: Not Currently    Comment: Pt stated It was opiates   Sexual activity: Not on file  Other Topics Concern   Not on file  Social History Narrative   Not on file   Social Drivers of Health   Financial Resource Strain: High Risk (11/17/2023)   Overall Financial Resource Strain (CARDIA)    Difficulty of Paying Living Expenses: Hard  Food Insecurity: Food Insecurity Present (02/17/2024)   Hunger Vital Sign    Worried About Running Out of Food in the Last Year: Often true    Ran Out of Food in the Last Year: Often true  Transportation Needs: No Transportation Needs (02/04/2024)   PRAPARE - Administrator, Civil Service (Medical): No    Lack of Transportation (Non-Medical): No  Physical Activity: Not on file   Stress: Not on file  Social Connections: Not on file  Intimate Partner Violence: Not At Risk (02/04/2024)   Humiliation, Afraid, Rape, and Kick questionnaire    Fear of Current or Ex-Partner: No    Emotionally Abused: No    Physically Abused: No    Sexually Abused: No   Family History  Problem Relation Age of Onset   Hypertension Mother    Hypertension Father    There were no vitals taken for this visit.  Wt Readings from Last 3 Encounters:  02/17/24 ROLLEN)  159.8 kg (352 lb 3.2 oz)  02/09/24 (!) 164.7 kg (363 lb)  02/04/24 (!) 167 kg (368 lb 2.7 oz)    PHYSICAL EXAM: General:  *** appearing.  No respiratory difficulty Neck: JVD *** cm.  Cor: Regular rate & rhythm. No murmurs. Lungs: clear Extremities: no edema  Neuro: alert & oriented x 3. Affect pleasant.     ASSESSMENT & PLAN: 1. Chronic Systolic and Diastolic HF - Echo 9/21 EF 20-25% RV moderately HK. Suspect HTN vs PVC-mediated (PVC burden 4.3% - probably not high enough to cause CM) - LHC 1/23 no CAD EF < 10% - CPX 2/23 pVO2: 21.6 (87% predicted peak VO2) corrected for ibw pVO2 41.6 ml/kg (ibw)/min (99% of the ibw-adjusted predicted). Slope:45 O2pulse:  19 (90% pred) - Echo 5/23, 2/24, and 11/24: EF 20-25% RV moderately HK - Most recent echo 9/25 unchanged 20-25%, G3DD, mod reduced RV, RVSP 50.5 - CPX test reassuring VO2 but slope high  - Improved NYHA II, volume looks OK today, weight down 9 lbs - Continue torsemide  100 mg daily, OK to take extra 40 mg PRN - Continue Toprol  XL 25 mg daily. - Continue ivabradine  7.5 mg bid - Continue Entresto  24/26 mg bid - Continue digoxin  0.125 mg daily.  - Continue spiro 25 mg daily. - Continue Farxiga  10 mg daily - has met with EP but decided to defer ICD - not candidate for advanced therapies currently given social situation - Labs today  2. Sinus Tach  - HR <100 today  - Continue Toprol  XL  25 mg daily. - Continue Ivabradine  7.5 mg bid.  - Continue digoxin  0.125 mg  daily.   3.  H/o Frequent PVCs - Zio 12/21 4.3% PVCs - probably not high enough burden to cause CM  - Has severe OSA.   4.  Morbid obesity - There is no height or weight on file to calculate BMI. - Remains on GLP1RA without much success - ? Switch to tirzepatide or retatrutide (when available) would result in more meaningful weight loss  5. OSA, Severe - Not on CPAP yet.  - Cannot afford. Engaged HFSW for resources.  6.  Tobacco use - Still smoking 1/2 ppd  - Discussed cessation - Says nicotine  patches don't work  7. H/O IVDA (heroin) - Last used 2020. Remains sober.  - No change  8. SDOH  - HFSW helping with housing issues - Needs PCP  Follow up as scheduled with Dr. Cherrie Beckey LITTIE Hayes, NP 1:32 PM

## 2024-03-16 ENCOUNTER — Telehealth: Payer: Self-pay | Admitting: Pharmacy Technician

## 2024-03-16 ENCOUNTER — Other Ambulatory Visit (HOSPITAL_BASED_OUTPATIENT_CLINIC_OR_DEPARTMENT_OTHER): Payer: Self-pay

## 2024-03-16 ENCOUNTER — Encounter (HOSPITAL_COMMUNITY): Payer: MEDICAID

## 2024-03-16 NOTE — Telephone Encounter (Signed)
 Pharmacy Patient Advocate Encounter   Received notification from CoverMyMeds that prior authorization for wegovy  is required/requested.   Insurance verification completed.   The patient is insured through [facility] MEDICAID.   Per test claim: PA required; PA submitted to above mentioned insurance via Latent Key/confirmation #/EOC [id number] Status is pending

## 2024-03-17 NOTE — Telephone Encounter (Signed)
 Pharmacy Patient Advocate Encounter  Received notification from [facility] MEDICAID that Prior Authorization for Wegovy  has been DENIED.  Full denial letter will be uploaded to the media tab. See denial reason below.   PA #/Case ID/Reference #: [id number]

## 2024-03-20 ENCOUNTER — Other Ambulatory Visit (HOSPITAL_BASED_OUTPATIENT_CLINIC_OR_DEPARTMENT_OTHER): Payer: Self-pay

## 2024-03-20 ENCOUNTER — Other Ambulatory Visit (HOSPITAL_COMMUNITY): Payer: Self-pay | Admitting: Cardiology

## 2024-03-20 MED ORDER — SPIRONOLACTONE 25 MG PO TABS
25.0000 mg | ORAL_TABLET | Freq: Every day | ORAL | 3 refills | Status: AC
  Filled 2024-03-20 – 2024-04-23 (×2): qty 90, 90d supply, fill #0

## 2024-03-24 ENCOUNTER — Telehealth: Payer: Self-pay | Admitting: Cardiology

## 2024-03-24 NOTE — Telephone Encounter (Signed)
 [employer] called in requesting more information for a PA that was denied on this medication please advise      semaglutide -weight management (WEGOVY ) 2.4 MG/0.75ML SOAJ SQ injection

## 2024-03-27 ENCOUNTER — Other Ambulatory Visit (HOSPITAL_BASED_OUTPATIENT_CLINIC_OR_DEPARTMENT_OTHER): Payer: Self-pay

## 2024-03-27 NOTE — Telephone Encounter (Signed)
 PA was appealed - Appeal approved with dates 03/16/24- 09/22/24  Attached in media for reference

## 2024-03-29 ENCOUNTER — Other Ambulatory Visit: Payer: Self-pay

## 2024-03-29 ENCOUNTER — Other Ambulatory Visit (HOSPITAL_BASED_OUTPATIENT_CLINIC_OR_DEPARTMENT_OTHER): Payer: Self-pay

## 2024-03-30 ENCOUNTER — Other Ambulatory Visit (HOSPITAL_BASED_OUTPATIENT_CLINIC_OR_DEPARTMENT_OTHER): Payer: Self-pay

## 2024-03-31 ENCOUNTER — Other Ambulatory Visit (HOSPITAL_COMMUNITY): Payer: Self-pay | Admitting: Cardiology

## 2024-03-31 ENCOUNTER — Other Ambulatory Visit: Payer: Self-pay

## 2024-03-31 ENCOUNTER — Other Ambulatory Visit (HOSPITAL_COMMUNITY): Payer: Self-pay

## 2024-03-31 DIAGNOSIS — I5023 Acute on chronic systolic (congestive) heart failure: Secondary | ICD-10-CM

## 2024-03-31 DIAGNOSIS — I50812 Chronic right heart failure: Secondary | ICD-10-CM

## 2024-03-31 MED ORDER — DIGOXIN 125 MCG PO TABS
0.1250 mg | ORAL_TABLET | Freq: Every day | ORAL | 3 refills | Status: AC
  Filled 2024-03-31 – 2024-04-23 (×2): qty 90, 90d supply, fill #0

## 2024-04-11 ENCOUNTER — Other Ambulatory Visit (HOSPITAL_COMMUNITY): Payer: Self-pay

## 2024-04-13 ENCOUNTER — Ambulatory Visit: Payer: MEDICAID

## 2024-04-24 ENCOUNTER — Other Ambulatory Visit: Payer: Self-pay

## 2024-04-24 ENCOUNTER — Other Ambulatory Visit (HOSPITAL_BASED_OUTPATIENT_CLINIC_OR_DEPARTMENT_OTHER): Payer: Self-pay

## 2024-04-26 ENCOUNTER — Ambulatory Visit: Payer: MEDICAID

## 2024-04-26 VITALS — BP 106/76 | HR 98 | Temp 98.5°F | Resp 18 | Ht 71.0 in | Wt 395.0 lb

## 2024-04-26 DIAGNOSIS — Z7689 Persons encountering health services in other specified circumstances: Secondary | ICD-10-CM

## 2024-04-26 DIAGNOSIS — Z13228 Encounter for screening for other metabolic disorders: Secondary | ICD-10-CM | POA: Diagnosis not present

## 2024-04-26 DIAGNOSIS — Z130 Encounter for screening for diseases of the blood and blood-forming organs and certain disorders involving the immune mechanism: Secondary | ICD-10-CM | POA: Diagnosis not present

## 2024-04-26 DIAGNOSIS — R61 Generalized hyperhidrosis: Secondary | ICD-10-CM

## 2024-04-26 DIAGNOSIS — Z1159 Encounter for screening for other viral diseases: Secondary | ICD-10-CM

## 2024-04-26 DIAGNOSIS — Z1329 Encounter for screening for other suspected endocrine disorder: Secondary | ICD-10-CM

## 2024-04-26 NOTE — Progress Notes (Unsigned)
     Patient ID: Donald Murray, male    DOB: February 23, 1986  MRN: [id number]  CC: Establish Care   Subjective: Donald Murray is a 38 y.o. male with past medical history of CHF who presents to clinic for to established care.  Pt's main concern is profuse sweating for the past two months. Pt reports he has to change shirts 2-3 times a day due to amount of sweating. Pt reports he smokes cigarettes but denies alcohol use or use of other illicit drugs. Denies new medications, changes in lifestyle, or new environments. Denies shortness of breath, fever, or unexplained weight gain/loss.  Denies flushing, diarrhea, wheezing, palpitations, or headache.  Normal urination Has follow up with cardiology Dec 8th   No Known Allergies  ROS: Review of Systems Negative except as stated above  PHYSICAL EXAM: BP 106/76   Pulse 98   Temp 98.5 F (36.9 C) (Oral)   Resp 18   Ht 5' 11 (1.803 m)   Wt (!) 395 lb (179.2 kg)   SpO2 93%   BMI 55.09 kg/m   Physical Exam  General: well-appearing, no acute distress, diaphoretic Skin: no jaundice, rashes, or lesions Cardiovascular: regular heart rate and rhythm, normal S1/S2, no murmurs, gallops, or rubs Chest: lungs clear to auscultation bilaterally, equal breath sounds bilaterally Musculoskeletal: normal gait Extremities: no peripheral edema  ASSESSMENT AND PLAN: 1. Encounter to establish care with new provider  2. Sweating profusely (Primary) - TSH Rfx on Abnormal to Free T4 to rule out thyroid abnormality. - Cortisol to rule out adrenal gland abnormality   3. Screening for blood disease - CBC to rule out infectious causes   4. Screening for metabolic disorder - Comprehensive metabolic panel with GFR  5. Encounter for hepatitis C screening test for low risk patient - Hepatitis C antibody  6. Thyroid disorder screen - TSH Rfx on Abnormal to Free T4    Patient was given the opportunity to ask questions.  Patient verbalized understanding of  the plan and was able to repeat key elements of the plan.    Orders Placed This Encounter  Procedures   TSH Rfx on Abnormal to Free T4   Cortisol   Comprehensive metabolic panel with GFR   CBC   Hepatitis C antibody     Requested Prescriptions    No prescriptions requested or ordered in this encounter    Return in about 3 months (around 07/25/2024) for follow-up.  Sula Leavy Rode, PA-C

## 2024-04-27 LAB — COMPREHENSIVE METABOLIC PANEL WITH GFR
ALT: 23 IU/L (ref 0–44)
AST: 20 IU/L (ref 0–40)
Albumin: 3.7 g/dL — ABNORMAL LOW (ref 4.1–5.1)
Alkaline Phosphatase: 79 IU/L (ref 47–123)
BUN/Creatinine Ratio: 19 (ref 9–20)
BUN: 17 mg/dL (ref 6–20)
Bilirubin Total: 0.2 mg/dL (ref 0.0–1.2)
CO2: 24 mmol/L (ref 20–29)
Calcium: 9.2 mg/dL (ref 8.7–10.2)
Chloride: 100 mmol/L (ref 96–106)
Creatinine, Ser: 0.88 mg/dL (ref 0.76–1.27)
Globulin, Total: 2.4 g/dL (ref 1.5–4.5)
Glucose: 84 mg/dL (ref 70–99)
Potassium: 4.2 mmol/L (ref 3.5–5.2)
Sodium: 140 mmol/L (ref 134–144)
Total Protein: 6.1 g/dL (ref 6.0–8.5)
eGFR: 114 mL/min/1.73 (ref >59)

## 2024-04-27 LAB — CBC
Hematocrit: 48.9 % (ref 37.5–51.0)
Hemoglobin: 14.8 g/dL (ref 13.0–17.7)
MCH: 25.4 pg — ABNORMAL LOW (ref 26.6–33.0)
MCHC: 30.3 g/dL — ABNORMAL LOW (ref 31.5–35.7)
MCV: 84 fL (ref 79–97)
Platelets: 336 x10E3/uL (ref 150–450)
RBC: 5.82 x10E6/uL — ABNORMAL HIGH (ref 4.14–5.80)
RDW: 20 % — ABNORMAL HIGH (ref 11.6–15.4)
WBC: 10.2 x10E3/uL (ref 3.4–10.8)

## 2024-04-27 LAB — TSH RFX ON ABNORMAL TO FREE T4: TSH: 3.97 u[IU]/mL (ref 0.450–4.500)

## 2024-04-27 LAB — HEPATITIS C ANTIBODY: Hep C Virus Ab: NONREACTIVE

## 2024-04-27 LAB — CORTISOL: Cortisol: 12.1 ug/dL (ref 6.2–19.4)

## 2024-04-28 ENCOUNTER — Telehealth (HOSPITAL_COMMUNITY): Payer: Self-pay | Admitting: Internal Medicine

## 2024-04-28 NOTE — Telephone Encounter (Signed)
 Called to confirm/remind patient of their appointment at the [facility] on 04/28/2024.   Appointment:   [] Confirmed  [x] Left mess   [] No answer/No voice mail  [] VM Full/unable to leave message  [] Phone not in service  Patient reminded to bring all medications and/or complete list.  Confirmed patient has transportation. Gave directions, instructed to utilize valet parking.

## 2024-04-30 NOTE — Progress Notes (Signed)
 ADVANCED HF CLINIC NOTE  Primary Care: Leavy Lucas Fox, PA-C Primary Cardiologist: Redell Shallow, MD HF Cardiologist: Dr. Cherrie  HPI: Donald Murray is a 38 y.o. male with HTN, previous IVDA (heroin), tobacco use, obesity and systolic HF (onset 9/21).  Admitted 9/21 new onset HF in setting of severe HTN 174/128. ECG with sinus tach and frequent PVCs. Echo EF 20-25% Moderate RV dysfunction.   HF Consultation for the first time 12/21. 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 Zio placed to quantify PVC burden - 4.3%.  Admitted 12/22 with ADHF. Echo LVEF <20% w/ global HK, no visible thrombus, RV okay, mild MR.  LHC 1/23 with normal cors. EF < 10% Elevated filling pressures (PCWP 28) and low output (CI 1.5). Lasix  increased to 80 bid.  Sleep study 2/23: Severe OSA, moderate central sleep apnea.  Seen by Dr. Fernande in 7/23 and planning ICD. But hasn't been done due to scheduling issues.   Echo 11/24: EF 25%, RV moderately down  Admitted 09/02/23 for incarcerated ventral hernia s/p repair. Course uncomplicated.   Admitted in 5/25 for ADHF. Echo EF 20-25% mod RV HK  Co-ox ok Diuresed 20 pounds then left AMA.   Admitted to observation 9/25 with CAP and volume overload for IV antibiotics and IV diuresis.   Today he returns for HF follow up. 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 Feels good. Compliant with meds. Gets SOB at times. Taking torsemide  100 daily. (Will cut back some days. Smoking about 1/2 ppd.   Cardiac Studies: - Echo 5/25: EF 20-25%, mod RV HK - Echo 11/24: EF 25%, RV moderately down - Echo 2/24: EF 25-30%. RV moderately HK - Echo 09/24/21: EF 20-25% RV moderately HK - Echo 10/20: EF 60-65%  - RHC 1/23: RA 9, PA 66/38 (50), PCWP 28, CO/CI (fick) 3.9/1.5, PVR 5.7, PAPi 3.1 - Zio 12/21: Sinus - average HR 104; 4.3% PVCs - CPX 2/23: FVC 5.12  (92%), FEV1 4.34  (96%), FEV1/FVC 85 (103%), MVV 164  (93%)  Resting HR: 126 Standing HR:125 Peak HR: 175   (95% age  predicted max HR)  BP rest (standing): 92/70 BP peak: 148/72  Peak VO2: 21.6 (87% predicted peak VO2)  When adjusted to the patient's ideal body weight of 180.3 lb (81.8 kg) the peak VO2 is 41.6 ml/kg (ibw)/min (99% of the ibw-adjusted predicted).  VE/VCO2 slope:  45  VE/MVV:  92%  O2pulse:  19 (90% predicted O2pulse)   Past Medical History:  Diagnosis Date   Abdominal hernia    CHF (congestive heart failure) (HCC)    Hypertension    Obesity    Snoring    Current Outpatient Medications  Medication Sig Dispense Refill   dapagliflozin  propanediol (FARXIGA ) 10 MG TABS tablet Take 1 tablet (10 mg total) by mouth daily before breakfast. 30 tablet 11   digoxin  (LANOXIN ) 0.125 MG tablet Take 1 tablet (0.125 mg total) by mouth daily. 90 tablet 3   ivabradine  (CORLANOR ) 7.5 MG TABS tablet Take 1 tablet (7.5 mg total) by mouth 2 (two) times daily with a meal. 60 tablet 11   metoprolol  succinate (TOPROL  XL) 25 MG 24 hr tablet Take 1 tablet (25 mg total) by mouth daily. 90 tablet 3   potassium chloride  SA (KLOR-CON  M) 20 MEQ tablet Take 2 tablets  (40 meq) daily or as directed by Heart Failure Clinic 80 tablet 11   sacubitril -valsartan  (ENTRESTO ) 24-26 MG Take 1 tablet by mouth 2 (two) times daily. 60 tablet 11  semaglutide -weight management (WEGOVY ) 2.4 MG/0.75ML SOAJ SQ injection Inject 2.4 mg into the skin once a week. 3 mL 5   spironolactone  (ALDACTONE ) 25 MG tablet Take 1 tablet (25 mg total) by mouth daily. 90 tablet 3   torsemide  (DEMADEX ) 20 MG tablet Take 5 tablets (100 mg) daily or as directed by Heart Failure Clinic 160 tablet 11   No current facility-administered medications for this encounter.   No Known Allergies  Social History   Socioeconomic History   Marital status: Single    Spouse name: Not on file   Number of children: Not on file   Years of education: Not on file   Highest education level: Not on file  Occupational History   Not on file  Tobacco Use   Smoking  status: Every Day    Current packs/day: 1.00    Average packs/day: 1 pack/day for 16.0 years (16.0 ttl pk-yrs)    Types: Cigarettes   Smokeless tobacco: Never  Vaping Use   Vaping status: Some Days   Substances: Nicotine   Substance and Sexual Activity   Alcohol use: Not Currently    Comment: Pt stated 2 years clean   Drug use: Not Currently    Comment: Pt stated It was opiates   Sexual activity: Not on file  Other Topics Concern   Not on file  Social History Narrative   Not on file   Social Drivers of Health   Financial Resource Strain: High Risk (11/17/2023)   Overall Financial Resource Strain (CARDIA)    Difficulty of Paying Living Expenses: Hard  Food Insecurity: Food Insecurity Present (02/17/2024)   Hunger Vital Sign    Worried About Running Out of Food in the Last Year: Often true    Ran Out of Food in the Last Year: Often true  Transportation Needs: No Transportation Needs (02/04/2024)   PRAPARE - Administrator, Civil Service (Medical): No    Lack of Transportation (Non-Medical): No  Physical Activity: Inactive (04/26/2024)   Exercise Vital Sign    Days of Exercise per Week: 0 days    Minutes of Exercise per Session: 0 min  Stress: No Stress Concern Present (04/26/2024)   [employer] of Occupational Health - Occupational Stress Questionnaire    Feeling of Stress: Not at all  Social Connections: Moderately Integrated (04/26/2024)   Social Connection and Isolation Panel    Frequency of Communication with Friends and Family: Three times a week    Frequency of Social Gatherings with Friends and Family: Three times a week    Attends Religious Services: More than 4 times per year    Active Member of Clubs or Organizations: Patient unable to answer    Attends Club or Organization Meetings: 1 to 4 times per year    Marital Status: Never married  Intimate Partner Violence: Not At Risk (02/04/2024)   Humiliation, Afraid, Rape, and Kick questionnaire     Fear of Current or Ex-Partner: No    Emotionally Abused: No    Physically Abused: No    Sexually Abused: No   Family History  Problem Relation Age of Onset   Hypertension Mother    Hypertension Father    BP (!) 118/28   Pulse 98   Ht 5' 11 (1.803 m)   Wt (!) 178.6 kg (393 lb 12.8 oz)   SpO2 96%   BMI 54.92 kg/m   Wt Readings from Last 3 Encounters:  05/01/24 (!) 178.6 kg (393 lb 12.8 oz)  04/26/24 (!) 179.2 kg (395 lb)  02/17/24 (!) 159.8 kg (352 lb 3.2 oz)    PHYSICAL EXAM: General:  Sitting up in bed. No resp difficulty HEENT: normal Neck: supple. no JVD.  Cor: Regular rate & rhythm. No rubs, gallops or murmurs. Lungs: clear Abdomen: obese soft, nontender, nondistended.Good bowel sounds. Extremities: no cyanosis, clubbing, rash, edema Neuro: alert & orientedx3, cranial nerves grossly intact. moves all 4 extremities w/o difficulty. Affect pleasant  ASSESSMENT & PLAN: 1. Chronic Systolic HF - Echo 9/21 EF 20-25% RV moderately HK. Suspect HTN vs PVC-mediated (PVC burden 4.3% - probably not high enough to cause CM) - LHC 1/23 no CAD EF < 10% - CPX 2/23 pVO2: 21.6 (87% predicted peak VO2) corrected for ibw pVO2 41.6 ml/kg (ibw)/min (99% of the ibw-adjusted predicted). Slope:45 O2pulse:  19 (90% pred) - Echo 5/23, 2/24, and 11/24: EF 20-25% RV moderately HK - Echo 9/25 unchanged 20-25%, G3DD, mod reduced RV, RVSP 50.5 - CPX test reassuring VO2 but slope high  - Stable NYHA II Volume ok - Continue torsemide  100 mg daily, OK to take extra 40 mg PRN - Increase Toprol  XL 25 mg daily -> to 25 bid - Continue ivabradine  7.5 mg bid - Continue Entresto  24/26 mg bid - Continue digoxin  0.125 mg daily.  - Continue spiro 25 mg daily. - Continue Farxiga  10 mg daily - has met with EP but decided to defer ICD due to scheduling - will reconnect him with EP - not candidate for advanced therapies currently given  tobacco  - Recent labs 04/26/24 were ok. K 4.2 Scr 0.88   2. Sinus Tach   - HR <100 today  - Will increase Toprol  to 25 bid - Continue Ivabradine  7.5 mg bid.  - Continue digoxin  0.125 mg daily.   3.  H/o Frequent PVCs - Zio 12/21 4.3% PVCs - probably not high enough burden to cause CM  - Has severe OSA.   4.  Morbid obesity - Body mass index is 54.92 kg/m. - Remains on Wegovy  - ? Switch to tirzepatide or retatrutide (when available)  5. OSA, Severe - Not on CPAP yet.  - Unable to afford CPAP with [facility] but now on Medicaid - Will refer back to Dr. Shlomo  6.  Tobacco use - Still smoking 0.5ppd - Discussed cessation  7. H/O IVDA (heroin) - Last used 2020. Remains sober.  - No change    Toribio Fuel, MD 9:44 AM

## 2024-05-01 ENCOUNTER — Ambulatory Visit: Payer: Self-pay

## 2024-05-01 ENCOUNTER — Encounter (HOSPITAL_COMMUNITY): Payer: Self-pay | Admitting: Internal Medicine

## 2024-05-01 ENCOUNTER — Inpatient Hospital Stay (HOSPITAL_COMMUNITY)
Admission: RE | Admit: 2024-05-01 | Discharge: 2024-05-01 | Disposition: A | Payer: MEDICAID | Source: Ambulatory Visit | Attending: Internal Medicine | Admitting: Internal Medicine

## 2024-05-01 ENCOUNTER — Other Ambulatory Visit (HOSPITAL_COMMUNITY): Payer: Self-pay

## 2024-05-01 VITALS — BP 118/28 | HR 98 | Ht 71.0 in | Wt 393.8 lb

## 2024-05-01 DIAGNOSIS — I493 Ventricular premature depolarization: Secondary | ICD-10-CM

## 2024-05-01 DIAGNOSIS — G4733 Obstructive sleep apnea (adult) (pediatric): Secondary | ICD-10-CM

## 2024-05-01 DIAGNOSIS — I5022 Chronic systolic (congestive) heart failure: Secondary | ICD-10-CM

## 2024-05-01 MED ORDER — METOPROLOL SUCCINATE ER 25 MG PO TB24
25.0000 mg | ORAL_TABLET | Freq: Two times a day (BID) | ORAL | 3 refills | Status: AC
  Filled 2024-05-01 – 2024-06-01 (×2): qty 180, 90d supply, fill #0

## 2024-05-01 NOTE — Patient Instructions (Signed)
 Medication Changes:  INCREASE METOPROLOL  SUCCINATE TO 25MG  TWICE DAILY   Referrals:  YOU HAVE BEEN REFERRED TO ELECTROPHYSIOLOGY THEY WILL REACH OUT TO YOU OR CALL TO ARRANGE THIS. PLEASE CALL US  WITH ANY CONCERNS   YOU HAVE BEEN REFERRED TO DR. SHLOMO FOR SLEEP APNEA THEY WILL REACH OUT TO YOU OR CALL TO ARRANGE THIS. PLEASE CALL US  WITH ANY CONCERNS   Follow-Up in: 4 MONTHS PLEASE CALL OUR OFFICE AROUND FEBRUARY TO GET SCHEDULED FOR YOUR APPOINTMENT. PHONE NUMBER IS [phone] OPTION 2   At [facility], you and your health needs are our priority. We have a designated team specialized in the treatment of Heart Failure. This Care Team includes your primary Heart Failure Specialized Cardiologist (physician), Advanced Practice Providers (APPs- Physician Assistants and Nurse Practitioners), and Pharmacist who all work together to provide you with the care you need, when you need it.   You may see any of the following providers on your designated Care Team at your next follow up:  Dr. Toribio Fuel Dr. Ezra Shuck Dr. Odis Brownie Greig Mosses, NP Caffie Shed, [place] [facility] [place], [place] Beckey Coe, NP Jordan Lee, NP Tinnie Redman, PharmD   Please be sure to bring in all your medications bottles to every appointment.   Need to Contact Us :  If you have any questions or concerns before your next appointment please send us  a message through [place] or call our office at [phone].    TO LEAVE A MESSAGE FOR THE NURSE SELECT OPTION 2, PLEASE LEAVE A MESSAGE INCLUDING: YOUR NAME DATE OF BIRTH CALL BACK NUMBER REASON FOR CALL**this is important as we prioritize the call backs  YOU WILL RECEIVE A CALL BACK THE SAME DAY AS LONG AS YOU CALL BEFORE 4:00 PM

## 2024-05-11 ENCOUNTER — Other Ambulatory Visit: Payer: Self-pay | Admitting: Cardiology

## 2024-05-11 ENCOUNTER — Other Ambulatory Visit (HOSPITAL_BASED_OUTPATIENT_CLINIC_OR_DEPARTMENT_OTHER): Payer: Self-pay

## 2024-05-11 ENCOUNTER — Other Ambulatory Visit: Payer: Self-pay

## 2024-05-11 ENCOUNTER — Other Ambulatory Visit (HOSPITAL_COMMUNITY): Payer: Self-pay

## 2024-05-11 DIAGNOSIS — Z6841 Body Mass Index (BMI) 40.0 and over, adult: Secondary | ICD-10-CM

## 2024-05-11 MED ORDER — WEGOVY 2.4 MG/0.75ML ~~LOC~~ SOAJ
2.4000 mg | SUBCUTANEOUS | 5 refills | Status: AC
  Filled 2024-05-11 – 2024-06-01 (×2): qty 3, 28d supply, fill #0

## 2024-05-23 ENCOUNTER — Other Ambulatory Visit (HOSPITAL_BASED_OUTPATIENT_CLINIC_OR_DEPARTMENT_OTHER): Payer: Self-pay

## 2024-06-01 ENCOUNTER — Other Ambulatory Visit (HOSPITAL_BASED_OUTPATIENT_CLINIC_OR_DEPARTMENT_OTHER): Payer: Self-pay

## 2024-06-02 ENCOUNTER — Other Ambulatory Visit (HOSPITAL_BASED_OUTPATIENT_CLINIC_OR_DEPARTMENT_OTHER): Payer: Self-pay

## 2024-06-15 ENCOUNTER — Ambulatory Visit: Payer: MEDICAID | Admitting: Cardiology

## 2024-07-25 ENCOUNTER — Ambulatory Visit: Payer: MEDICAID

## 2024-07-31 ENCOUNTER — Ambulatory Visit: Payer: MEDICAID | Admitting: Cardiology
